# Patient Record
Sex: Male | Born: 1935 | Race: White | Hispanic: No | Smoking: Former smoker
Health system: Southern US, Community
[De-identification: ages and names within clinical notes are randomized; demographics above are authoritative.]

## PROBLEM LIST (undated history)

## (undated) DIAGNOSIS — E785 Hyperlipidemia, unspecified: Secondary | ICD-10-CM

## (undated) DIAGNOSIS — K769 Liver disease, unspecified: Secondary | ICD-10-CM

## (undated) DIAGNOSIS — M1990 Unspecified osteoarthritis, unspecified site: Secondary | ICD-10-CM

## (undated) DIAGNOSIS — Z950 Presence of cardiac pacemaker: Secondary | ICD-10-CM

## (undated) DIAGNOSIS — I350 Nonrheumatic aortic (valve) stenosis: Secondary | ICD-10-CM

## (undated) DIAGNOSIS — H269 Unspecified cataract: Secondary | ICD-10-CM

## (undated) DIAGNOSIS — K219 Gastro-esophageal reflux disease without esophagitis: Secondary | ICD-10-CM

## (undated) DIAGNOSIS — R011 Cardiac murmur, unspecified: Secondary | ICD-10-CM

## (undated) DIAGNOSIS — I10 Essential (primary) hypertension: Secondary | ICD-10-CM

## (undated) DIAGNOSIS — K222 Esophageal obstruction: Secondary | ICD-10-CM

## (undated) HISTORY — DX: Nonrheumatic aortic (valve) stenosis: I35.0

## (undated) HISTORY — DX: Cardiac murmur, unspecified: R01.1

## (undated) HISTORY — DX: Gastro-esophageal reflux disease without esophagitis: K21.9

## (undated) HISTORY — DX: Liver disease, unspecified: K76.9

## (undated) HISTORY — DX: Unspecified cataract: H26.9

## (undated) HISTORY — DX: Esophageal obstruction: K22.2

## (undated) HISTORY — DX: Unspecified osteoarthritis, unspecified site: M19.90

---
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
## 1999-10-20 HISTORY — PX: HEMORROIDECTOMY: SUR656

## 2003-10-20 HISTORY — PX: HERNIA REPAIR: SHX51

## 2011-10-20 HISTORY — PX: OTHER SURGICAL HISTORY: SHX169

## 2016-07-11 ENCOUNTER — Encounter (HOSPITAL_BASED_OUTPATIENT_CLINIC_OR_DEPARTMENT_OTHER): Payer: Self-pay | Admitting: Emergency Medicine

## 2016-07-11 ENCOUNTER — Emergency Department (HOSPITAL_BASED_OUTPATIENT_CLINIC_OR_DEPARTMENT_OTHER)
Admission: EM | Admit: 2016-07-11 | Discharge: 2016-07-11 | Disposition: A | Discharge status: Home / Self Care | Payer: Medicare PPO | Attending: Emergency Medicine | Admitting: Emergency Medicine

## 2016-07-11 ENCOUNTER — Emergency Department (HOSPITAL_BASED_OUTPATIENT_CLINIC_OR_DEPARTMENT_OTHER): Payer: Medicare PPO

## 2016-07-11 ENCOUNTER — Other Ambulatory Visit: Payer: Self-pay

## 2016-07-11 DIAGNOSIS — R0602 Shortness of breath: Secondary | ICD-10-CM | POA: Insufficient documentation

## 2016-07-11 DIAGNOSIS — Z79899 Other long term (current) drug therapy: Secondary | ICD-10-CM | POA: Insufficient documentation

## 2016-07-11 DIAGNOSIS — I10 Essential (primary) hypertension: Secondary | ICD-10-CM | POA: Diagnosis present

## 2016-07-11 HISTORY — DX: Essential (primary) hypertension: I10

## 2016-07-11 HISTORY — DX: Hyperlipidemia, unspecified: E78.5

## 2016-07-11 LAB — CBC WITH DIFFERENTIAL/PLATELET
BASOS ABS: 0 10*3/uL (ref 0.0–0.1)
Basophils Relative: 0 %
EOS ABS: 0.2 10*3/uL (ref 0.0–0.7)
EOS PCT: 3 %
HCT: 43.1 % (ref 39.0–52.0)
Hemoglobin: 15.2 g/dL (ref 13.0–17.0)
Lymphocytes Relative: 20 %
Lymphs Abs: 1.2 10*3/uL (ref 0.7–4.0)
MCH: 33.9 pg (ref 26.0–34.0)
MCHC: 35.3 g/dL (ref 30.0–36.0)
MCV: 96.2 fL (ref 78.0–100.0)
MONO ABS: 0.6 10*3/uL (ref 0.1–1.0)
Monocytes Relative: 9 %
Neutro Abs: 3.9 10*3/uL (ref 1.7–7.7)
Neutrophils Relative %: 68 %
PLATELETS: 123 10*3/uL — AB (ref 150–400)
RBC: 4.48 MIL/uL (ref 4.22–5.81)
RDW: 11.8 % (ref 11.5–15.5)
WBC: 5.8 10*3/uL (ref 4.0–10.5)

## 2016-07-11 LAB — COMPREHENSIVE METABOLIC PANEL
ALK PHOS: 58 U/L (ref 38–126)
ALT: 18 U/L (ref 17–63)
ANION GAP: 9 (ref 5–15)
AST: 31 U/L (ref 15–41)
Albumin: 4.6 g/dL (ref 3.5–5.0)
BILIRUBIN TOTAL: 1.5 mg/dL — AB (ref 0.3–1.2)
BUN: 14 mg/dL (ref 6–20)
CALCIUM: 9.2 mg/dL (ref 8.9–10.3)
CO2: 28 mmol/L (ref 22–32)
CREATININE: 0.77 mg/dL (ref 0.61–1.24)
Chloride: 103 mmol/L (ref 101–111)
Glucose, Bld: 97 mg/dL (ref 65–99)
Potassium: 3.9 mmol/L (ref 3.5–5.1)
SODIUM: 140 mmol/L (ref 135–145)
TOTAL PROTEIN: 7.5 g/dL (ref 6.5–8.1)

## 2016-07-11 LAB — URINALYSIS, ROUTINE W REFLEX MICROSCOPIC
Bilirubin Urine: NEGATIVE
GLUCOSE, UA: NEGATIVE mg/dL
Hgb urine dipstick: NEGATIVE
Ketones, ur: 15 mg/dL — AB
NITRITE: NEGATIVE
PROTEIN: NEGATIVE mg/dL
SPECIFIC GRAVITY, URINE: 1.012 (ref 1.005–1.030)
pH: 7 (ref 5.0–8.0)

## 2016-07-11 LAB — URINE MICROSCOPIC-ADD ON: RBC / HPF: NONE SEEN RBC/hpf (ref 0–5)

## 2016-07-11 LAB — TROPONIN I

## 2016-07-11 LAB — PROTIME-INR
INR: 0.97
PROTHROMBIN TIME: 12.9 s (ref 11.4–15.2)

## 2016-07-11 LAB — BRAIN NATRIURETIC PEPTIDE: B Natriuretic Peptide: 30.3 pg/mL (ref 0.0–100.0)

## 2016-07-11 MED ORDER — ASPIRIN 81 MG PO CHEW
324.0000 mg | CHEWABLE_TABLET | Freq: Once | ORAL | Status: DC
  Filled 2016-07-11: qty 4

## 2016-07-11 MED ORDER — METOPROLOL TARTRATE 5 MG/5ML IV SOLN
2.5000 mg | Freq: Once | INTRAVENOUS | Status: DC
  Filled 2016-07-11: qty 5

## 2016-07-11 MED ORDER — ASPIRIN 81 MG PO CHEW
243.0000 mg | CHEWABLE_TABLET | Freq: Once | ORAL | Status: AC
  Administered 2016-07-11: 243 mg via ORAL

## 2016-07-11 NOTE — ED Notes (Signed)
Pt took 1 81mg  aspirin tablet at home this morning prior to coming to ED.  Dr Johnney Killian aware.  324mg  aspiring order changed to 243mg  aspirin.

## 2016-07-11 NOTE — ED Notes (Signed)
Pt reports dizziness this morning and states when he checjed his BP at home, it was systolic BP over A999333, diastolic over 123XX123.  Pt denies chest pain, SOB, nausea or other symptoms at this time.  Pt states he took 2 pills of his home BP medications (Lisinopril) this morning (NOT as prescribed) due to his concerns about his elevated BP.

## 2016-07-11 NOTE — ED Triage Notes (Addendum)
Pt reports taking BP at home and has had a high reading over the last several days. Pt takes lisinopril. Denies HA. Pt reports hearing a buzzing in his ears. Denies chest pain or SOB. Pt also reports feeling light headed

## 2016-07-11 NOTE — ED Notes (Signed)
Patient transported to X-ray 

## 2016-07-11 NOTE — Discharge Instructions (Signed)
Monitoring your blood pressure for the next several days. Keep a log. Write down the times of the day when you take your blood pressure medications. Keep a log of your diet. Discontinue meloxicam. If you need pain medication for arthritic pain, use extra strength Tylenol.

## 2016-07-11 NOTE — ED Provider Notes (Signed)
[facility] Provider Note   CSN: [id number] Arrival date & time: 07/11/16  1053     History   Chief Complaint Chief Complaint  Patient presents with  . Hypertension    HPI Joshua [name] is a 80 y.o. male.  HPI Patient's blood pressure spiked today. He reports that it was going up from 123456 systolic up to A999333 systolic on his home monitor. He reports he was monitoring his blood pressure because he just didn't feel that well this morning. He had a vague feeling of malaise. There was no headache, no blurred vision, no chest pain, no shortness of breath. Patient has noticed a slight amount of swelling in his legs more than normal for him. He has been taking meloxicam for about the past 2 months for knee pain that is considered osteoarthritic in nature. This was diagnosed by his orthopod. He has otherwise been on the same dose of lisinopril for several years. He takes 5 mg in the morning and 5 mg in the evening. He reports is compliant with his blood pressure medications.  Patient reports has a fairly prominent heart murmur and is been present since childhood. He reports has been stable for his whole life. Past Medical History:  Diagnosis Date  . Hyperlipidemia   . Hypertension     There are no active problems to display for this patient.   Past Surgical History:  Procedure Laterality Date  . HERNIA REPAIR    . TONSILLECTOMY         Home Medications    Prior to Admission medications   Medication Sig Start Date End Date Taking? Authorizing Provider  atorvastatin (LIPITOR) 10 MG tablet Take 10 mg by mouth daily.   Yes Historical Provider, MD  lisinopril (PRINIVIL,ZESTRIL) 5 MG tablet Take 5 mg by mouth daily.   Yes Historical Provider, MD    Family History No family history on file.  Social History Social History  Substance Use Topics  . Smoking status: Never Smoker  . Smokeless tobacco: Never Used  . Alcohol use Yes     Allergies   Review of  patient's allergies indicates no known allergies.   Review of Systems Review of Systems 10 Systems reviewed and are negative for acute change except as noted in the HPI.   Physical Exam Updated Vital Signs BP 143/68   Pulse 91   Temp 98.7 F (37.1 C) (Oral)   Resp 17   Ht 6' (1.829 m)   Wt 187 lb (84.8 kg)   SpO2 96%   BMI 25.36 kg/m   Physical Exam  Constitutional: He is oriented to person, place, and time. He appears well-developed and well-nourished.  HENT:  Head: Normocephalic and atraumatic.  Mouth/Throat: Oropharynx is clear and moist.  Eyes: Conjunctivae and EOM are normal.  Neck: Neck supple.  Cardiovascular: Normal rate and regular rhythm.   Murmur heard. Patient has 3\6 systolic ejection murmur. No gallop. Occasional ectopic beat.  Pulmonary/Chest: Effort normal and breath sounds normal. No respiratory distress. He has no wheezes. He has no rales.  Abdominal: Soft. There is no tenderness.  Musculoskeletal: He exhibits edema.  Patient has approximately 1+ pitting edema bilateral ankles. No calf tenderness.  Neurological: He is alert and oriented to person, place, and time. No cranial nerve deficit. He exhibits normal muscle tone. Coordination normal.  Skin: Skin is warm and dry.  Psychiatric: He has a normal mood and affect.  Nursing note and vitals reviewed.    ED Treatments /  Results  Labs (all labs ordered are listed, but only abnormal results are displayed) Labs Reviewed  COMPREHENSIVE METABOLIC PANEL - Abnormal; Notable for the following:       Result Value   Total Bilirubin 1.5 (*)    All other components within normal limits  CBC WITH DIFFERENTIAL/PLATELET - Abnormal; Notable for the following:    Platelets 123 (*)    All other components within normal limits  URINALYSIS, ROUTINE W REFLEX MICROSCOPIC (NOT AT [facility]) - Abnormal; Notable for the following:    Ketones, ur 15 (*)    Leukocytes, UA SMALL (*)    All other components within normal limits   URINE MICROSCOPIC-ADD ON - Abnormal; Notable for the following:    Squamous Epithelial / LPF 0-5 (*)    Bacteria, UA RARE (*)    All other components within normal limits  BRAIN NATRIURETIC PEPTIDE  TROPONIN I  PROTIME-INR    EKG  EKG Interpretation  Date/Time:  Saturday July 11 2016 11:21:42 EDT Ventricular Rate:  97 PR Interval:    QRS Duration: 91 QT Interval:  356 QTC Calculation: 453 R Axis:   105 Text Interpretation:  Sinus tachycardia Ventricular trigeminy Right axis deviation Probable anteroseptal infarct, old frequent PVC. No acute ischemic appearance. no old comparison. Confirmed by Johnney Killian, MD, Jeannie Done [phone]) on 07/11/2016 3:57:11 PM       Radiology Dg Chest 2 View  Result Date: 07/11/2016 CLINICAL DATA:  Hypertension EXAM: CHEST  2 VIEW COMPARISON:  None. FINDINGS: Lungs are clear.  No pleural effusion or pneumothorax. The heart is normal in size. Degenerative changes of the visualized thoracolumbar spine. IMPRESSION: Normal chest radiographs. Electronically Signed   By: Julian Hy M.D.   On: 07/11/2016 12:23    Procedures Procedures (including critical care time)  Medications Ordered in ED Medications  aspirin chewable tablet 243 mg (243 mg Oral Given 07/11/16 1156)     Initial Impression / Assessment and Plan / ED Course  I have reviewed the triage vital signs and the nursing notes.  Pertinent labs & imaging results that were available during my care of the patient were reviewed by me and considered in my medical decision making (see chart for details).  Clinical Course    Final Clinical Impressions(s) / ED Diagnoses   Final diagnoses:  Essential hypertension   Patient had vague symptoms of malaise this morning. He measured his blood pressure and found it to be elevated and climbing. He did not have associated hypertensive urgency symptoms. Patient's blood pressure has spontaneously improved without administration of antihypertensives.  Diagnostic workup does not show acute ischemic event, vascular congestion or electrolyte anomaly. Patient is anxious to leave the hospital and at this time plan will be for him to continue his medications as prescribed in monitor with documentation his blood pressures as well as diet and medications that he takes. He is counseled that he must return immediately should he develop chest pain shortness of breath headache blurred vision or other concerning symptoms.  New Prescriptions Discharge Medication List as of 07/11/2016  1:54 PM       Charlesetta Shanks, MD 07/11/16 1601

## 2016-07-11 NOTE — ED Notes (Signed)
MD notified of pt's BP -- will hold Lopressor for now per MD verbal order.

## 2016-07-11 NOTE — ED Notes (Signed)
Dr Johnney Killian in room with patient now.

## 2016-07-15 ENCOUNTER — Encounter: Payer: Self-pay | Admitting: Cardiovascular Disease

## 2016-07-15 ENCOUNTER — Ambulatory Visit (INDEPENDENT_AMBULATORY_CARE_PROVIDER_SITE_OTHER): Payer: Medicare PPO | Admitting: Cardiovascular Disease

## 2016-07-15 VITALS — BP 148/96 | HR 85 | Ht 72.0 in | Wt 189.4 lb

## 2016-07-15 DIAGNOSIS — R011 Cardiac murmur, unspecified: Secondary | ICD-10-CM | POA: Diagnosis not present

## 2016-07-15 DIAGNOSIS — I150 Renovascular hypertension: Secondary | ICD-10-CM | POA: Diagnosis not present

## 2016-07-15 MED ORDER — LISINOPRIL 10 MG PO TABS: 10.0000 mg | ORAL_TABLET | Freq: Two times a day (BID) | ORAL | 3 refills | Status: DC

## 2016-07-15 NOTE — Patient Instructions (Signed)
Medication Instructions: Dr Sallyanne Kuster recommends that you continue on your current medications as directed. Please refer to the Current Medication list given to you today.  Labwork: NONE ORDERED  Testing/Procedures: 1. Echocardiogram - Your physician has requested that you have an echocardiogram. Echocardiography is a painless test that uses sound waves to create images of your heart. It provides your doctor with information about the size and shape of your heart and how well your heart's chambers and valves are working. This procedure takes approximately one hour. There are no restrictions for this procedure. This will be performed at our [place], [address].  2. Renal Artery Doppler - Your physician has requested that you have a renal artery duplex. During this test, an ultrasound is used to evaluate blood flow to the kidneys. Allow one hour for this exam. Do not eat after midnight the day before and avoid carbonated beverages. Take your medications as you usually do.  Follow-up: Dr Sallyanne Kuster recommends that you schedule a follow-up appointment in 1 month.  If you need a refill on your cardiac medications before your next appointment, please call your pharmacy.

## 2016-07-15 NOTE — Progress Notes (Signed)
Cardiology Consultation Note    Date:  07/16/2016   ID:  Joshua [name], DOB 1936/05/08, MRN [id number]  Requesting PCP:  Andria Frames, MD  Cardiologist:   Sanda Klein, MD   Reason for consultation: severely elevated blood pressure   History of Present Illness:  Joshua [name] is a 80 y.o. male with relatively late onset systemic hypertension around the age of 58, recently had recordings of markedly elevated blood pressure, as high as 220/100 mmHg. These were not symptomatic, but concerned him and led to emergency room evaluation on September 23. Workup was benign and his blood pressure spontaneously normalized. He reports that his blood pressure recordings are very erratic, changing from minute to minute. In the emergency room his electrocardiogram showed persistent ventricular trigeminy and this is also present on physical exam today. He reports having had a heart murmur since he was about 63 or 80 years old and believes that he had a remote echocardiogram that was unremarkable. Labs performed in the emergency room do not show significant abnormalities including normal potassium and creatinine levels unremarkable urinalysis and a normal chest x-ray. Troponin and BNP were both very low and hemoglobin is normal.   In the past, when taking once daily lisinopril his blood pressure would often be high towards the end of the day, which is why he started taking the medication twice daily. His home recordings now seem to show a pattern of elevation in blood pressure in the early mornings. Lower blood pressures later in the day.  Prior to his emergency room evaluation he was taking meloxicam daily for degenerative joint disease. He has subsequently stopped this medication.  He is fairly physically active and feels well. He specifically denies exertional angina, exertional dyspnea or exertional syncope/dizziness. He is unaware of palpitations even when his rhythm is clearly irregular. He  denies any history of stroke or other focal neurological events and has generally been very healthy his entire life.  He is originally from [place] but has lived in [place] and other parts of the [place]. His son lives in the area which is why he relocated to [place]. He is a retired Optometrist.  Past Medical History:  Diagnosis Date  . Hyperlipidemia   . Hypertension   . Liver disease     Past Surgical History:  Procedure Laterality Date  . HEMORROIDECTOMY  2001  . HERNIA REPAIR  2005  . TONSILLECTOMY  1948    Current Medications: Outpatient Medications Prior to Visit  Medication Sig Dispense Refill  . atorvastatin (LIPITOR) 10 MG tablet Take 10 mg by mouth daily.    Marland Kitchen lisinopril (PRINIVIL,ZESTRIL) 5 MG tablet Take 5 mg by mouth daily.     No facility-administered medications prior to visit.      Allergies:   Review of patient's allergies indicates no known allergies.   Social History   Social History  . Marital status: Married    Spouse name: N/A  . Number of children: N/A  . Years of education: N/A   Social History Main Topics  . Smoking status: Never Smoker  . Smokeless tobacco: Never Used  . Alcohol use Yes  . Drug use: Unknown  . Sexual activity: Not Asked   Other Topics Concern  . None   Social History Narrative   Epworth Sleepiness Scale = 3 (as of 07/15/2016)     Family History:  His father had rheumatic fever, valve replacement surgery and subsequent congestive heart failure. His mother  also died of congestive heart failure when she was 78 years old. He has lost a couple of siblings at a young age in a car accident.  ROS:   Please see the history of present illness.    ROS All other systems reviewed and are negative.   PHYSICAL EXAM:   VS:  BP (!) 148/96   Pulse 85   Ht 6' (1.829 m)   Wt 85.9 kg (189 lb 6.4 oz)   BMI 25.69 kg/m   Equal pressures in the upper and lower extremities and between the right and left upper  extremity. No radial femoral delay. No bruits heard over the subclavian or femoral arteries. No abdominal bruits  GEN: Well nourished, well developed, in no acute distress  HEENT: normal  Neck: no JVD, carotid bruits, or masses Cardiac: Normal first and second heart sound without audible click, RRR alternating with periods of sustained trigeminal rhythm; early to mid peaking 3/6 systolic ejection murmur in the aortic focus, no diastolic murmurs, rubs, or gallops, no edema  Respiratory:  clear to auscultation bilaterally, normal work of breathing GI: soft, nontender, nondistended, + BS MS: no deformity or atrophy  Skin: warm and dry, no rash Neuro:  Alert and Oriented x 3, Strength and sensation are intact Psych: euthymic mood, full affect  Wt Readings from Last 3 Encounters:  07/15/16 85.9 kg (189 lb 6.4 oz)  07/11/16 84.8 kg (187 lb)      Studies/Labs Reviewed:   EKG:  EKG is ordered today.  The ekg ordered today demonstrates Normal sinus rhythm with a single PVC  Recent Labs: 07/11/2016: ALT 18; B Natriuretic Peptide 30.3; BUN 14; Creatinine, Ser 0.77; Hemoglobin 15.2; Platelets 123; Potassium 3.9; Sodium 140    Additional studies/ records that were reviewed today include:  Records from emergency room evaluation and Dr. Kristie Cowman    ASSESSMENT:    1. Cardiac murmur   2. Renovascular hypertension      PLAN:  In order of problems listed above:  1. HTN: This has been rather erratic and has remarkably late age of onset, raising the possibility of renal vascular hypertension. He has normal potassium and renal function parameters and is already taking an ACE inhibitor. Recommend a renal duplex arterial ultrasound. Part of the marked variability in his blood pressure is, I am sure, related to his PVCs. The irregularity of the rhythm while he is in trigeminy can lead to marked errors in blood pressure estimation by an automatic blood pressure cuff. Have asked him to check his  blood pressure 3 or 4 times in a row and use the median value as the correct BP. For the time being no changes are made to his prescription. 2. Murmur: His murmur sounds like aortic stenosis that is probably at least moderately severe by exam. If he does have aortic stenosis, it appears to be asymptomatic. Since he has had a murmur since childhood, he may have a bicuspid aortic valve. His physical exam does not support a diagnosis of coarctation. Recommend echocardiogram. 3. DJD: Avoid NSAIDs, prefer acetaminophen     Medication Adjustments/Labs and Tests Ordered: Current medicines are reviewed at length with the patient today.  Concerns regarding medicines are outlined above.  Medication changes, Labs and Tests ordered today are listed in the Patient Instructions below. Patient Instructions  Medication Instructions: Dr Sallyanne Kuster recommends that you continue on your current medications as directed. Please refer to the Current Medication list given to you today.  Labwork: NONE ORDERED  Testing/Procedures: 1. Echocardiogram - Your physician has requested that you have an echocardiogram. Echocardiography is a painless test that uses sound waves to create images of your heart. It provides your doctor with information about the size and shape of your heart and how well your heart's chambers and valves are working. This procedure takes approximately one hour. There are no restrictions for this procedure. This will be performed at our [place], [address].  2. Renal Artery Doppler - Your physician has requested that you have a renal artery duplex. During this test, an ultrasound is used to evaluate blood flow to the kidneys. Allow one hour for this exam. Do not eat after midnight the day before and avoid carbonated beverages. Take your medications as you usually do.  Follow-up: Dr Sallyanne Kuster recommends that you schedule a follow-up appointment in 1 month.  If you need a  refill on your cardiac medications before your next appointment, please call your pharmacy.    Signed, Sanda Klein, MD  07/16/2016 2:37 PM    [address], [place],   [address] Phone: [phone]; Fax: [phone]

## 2016-07-16 ENCOUNTER — Other Ambulatory Visit: Payer: Self-pay | Admitting: Cardiovascular Disease

## 2016-07-16 DIAGNOSIS — R011 Cardiac murmur, unspecified: Secondary | ICD-10-CM | POA: Insufficient documentation

## 2016-07-16 DIAGNOSIS — I10 Essential (primary) hypertension: Secondary | ICD-10-CM | POA: Insufficient documentation

## 2016-07-22 ENCOUNTER — Ambulatory Visit (HOSPITAL_COMMUNITY)
Admission: RE | Admit: 2016-07-22 | Discharge: 2016-07-22 | Disposition: A | Discharge status: Home / Self Care | Payer: Medicare PPO | Source: Ambulatory Visit | Attending: Cardiovascular Disease | Admitting: Cardiovascular Disease

## 2016-07-22 DIAGNOSIS — E785 Hyperlipidemia, unspecified: Secondary | ICD-10-CM | POA: Insufficient documentation

## 2016-07-22 DIAGNOSIS — I10 Essential (primary) hypertension: Secondary | ICD-10-CM

## 2016-07-30 ENCOUNTER — Other Ambulatory Visit (HOSPITAL_COMMUNITY): Payer: Medicare PPO

## 2016-07-30 ENCOUNTER — Ambulatory Visit (HOSPITAL_COMMUNITY): Payer: Medicare PPO | Attending: Cardiology

## 2016-07-30 ENCOUNTER — Other Ambulatory Visit: Payer: Self-pay

## 2016-07-30 DIAGNOSIS — I10 Essential (primary) hypertension: Secondary | ICD-10-CM | POA: Diagnosis not present

## 2016-07-30 DIAGNOSIS — I350 Nonrheumatic aortic (valve) stenosis: Secondary | ICD-10-CM | POA: Insufficient documentation

## 2016-07-30 DIAGNOSIS — K769 Liver disease, unspecified: Secondary | ICD-10-CM | POA: Diagnosis not present

## 2016-07-30 DIAGNOSIS — E785 Hyperlipidemia, unspecified: Secondary | ICD-10-CM | POA: Insufficient documentation

## 2016-07-30 DIAGNOSIS — R011 Cardiac murmur, unspecified: Secondary | ICD-10-CM | POA: Diagnosis not present

## 2016-08-13 ENCOUNTER — Encounter: Payer: Self-pay | Admitting: Cardiovascular Disease

## 2016-08-13 ENCOUNTER — Ambulatory Visit (INDEPENDENT_AMBULATORY_CARE_PROVIDER_SITE_OTHER): Payer: Medicare PPO | Admitting: Cardiovascular Disease

## 2016-08-13 VITALS — BP 157/90 | HR 97 | Ht 72.0 in | Wt 187.8 lb

## 2016-08-13 DIAGNOSIS — I493 Ventricular premature depolarization: Secondary | ICD-10-CM

## 2016-08-13 DIAGNOSIS — I350 Nonrheumatic aortic (valve) stenosis: Secondary | ICD-10-CM

## 2016-08-13 DIAGNOSIS — E7800 Pure hypercholesterolemia, unspecified: Secondary | ICD-10-CM | POA: Diagnosis not present

## 2016-08-13 DIAGNOSIS — I10 Essential (primary) hypertension: Secondary | ICD-10-CM | POA: Diagnosis not present

## 2016-08-13 NOTE — Progress Notes (Signed)
Cardiology Office Note    Date:  08/14/2016   ID:  [name], DOB Mar 07, 1936, MRN [id number]  Requesting PCP:  Andria Frames, MD  Cardiologist:   Sanda Klein, MD   Chief complaint: Follow-up echocardiogram and renal artery duplex ultrasound  History of Present Illness:  Joshua [name] is a 80 y.o. male with relatively late onset systemic hypertension around the age of 89, recently had recordings of Asymptomatic but markedly elevated blood pressure, as high as 220/100 mmHg. He reports having had a heart murmur since he was about 50 or 80 years old. He has frequent asymptomatic PVCs.  He returns in follow-up after undergoing an echocardiogram and renal artery ultrasound. After he stopped taking meloxicam, his blood pressure has quickly normalized. He brings a very detailed account of his blood pressure checked on a daily basis, several times a day. His blood pressure is fairly consistently in the 110-120/70s. Occasional outer range readings are seen. He does have very frequent PVCs that may lead to erroneous readings.  Renal artery duplex ultrasound did not show any evidence of renal artery stenosis. His echocardiogram confirmed the clinical impression that he has moderate aortic valve stenosis.  He is physically active and feels well. He specifically denies exertional angina, exertional dyspnea or exertional syncope/dizziness. He is unaware of palpitations even when his rhythm is clearly irregular. He denies any history of stroke or other focal neurological events and has generally been very healthy his entire life.  He is originally from [place] but has lived in [place] and other parts of the [place]. His son lives in the area which is why he relocated to [place]. He is a retired Optometrist.  Past Medical History:  Diagnosis Date  . Hyperlipidemia   . Hypertension   . Liver disease     Past Surgical History:  Procedure Laterality Date  .  HEMORROIDECTOMY  2001  . HERNIA REPAIR  2005  . TONSILLECTOMY  1948    Current Medications: Outpatient Medications Prior to Visit  Medication Sig Dispense Refill  . aspirin 81 MG chewable tablet Chew 81 mg by mouth daily.    Marland Kitchen lisinopril (PRINIVIL,ZESTRIL) 10 MG tablet Take 1 tablet (10 mg total) by mouth 2 (two) times daily. 90 tablet 3  . omeprazole (PRILOSEC) 40 MG capsule Take 1 capsule by mouth daily.    Marland Kitchen atorvastatin (LIPITOR) 10 MG tablet Take 10 mg by mouth daily.     No facility-administered medications prior to visit.      Allergies:   Review of patient's allergies indicates no known allergies.   Social History   Social History  . Marital status: Married    Spouse name: N/A  . Number of children: N/A  . Years of education: N/A   Social History Main Topics  . Smoking status: Never Smoker  . Smokeless tobacco: Never Used  . Alcohol use Yes  . Drug use: Unknown  . Sexual activity: Not Asked   Other Topics Concern  . None   Social History Narrative   Epworth Sleepiness Scale = 3 (as of 07/15/2016)     Family History:  His father had rheumatic fever, valve replacement surgery and subsequent congestive heart failure. His mother also died of congestive heart failure when she was 78 years old. He has lost a couple of siblings at a young age in a car accident.  ROS:   Please see the history of present illness.    ROS All other  systems reviewed and are negative.   PHYSICAL EXAM:   VS:  BP (!) 157/90   Pulse 97   Ht 6' (1.829 m)   Wt 187 lb 12.8 oz (85.2 kg)   SpO2 93%   BMI 25.47 kg/m   Equal pressures in the upper and lower extremities and between the right and left upper extremity. No radial femoral delay. No bruits heard over the subclavian or femoral arteries. No abdominal bruits  GEN: Well nourished, well developed, in no acute distress  HEENT: normal  Neck: no JVD, carotid bruits, or masses Cardiac: Normal first and second heart sound without audible  click, RRR alternating with periods of sustained trigeminal rhythm; early to mid peaking 3/6 systolic ejection murmur in the aortic focus, no diastolic murmurs, rubs, or gallops, no edema  Respiratory:  clear to auscultation bilaterally, normal work of breathing GI: soft, nontender, nondistended, + BS MS: no deformity or atrophy  Skin: warm and dry, no rash Neuro:  Alert and Oriented x 3, Strength and sensation are intact Psych: euthymic mood, full affect  Wt Readings from Last 3 Encounters:  08/13/16 187 lb 12.8 oz (85.2 kg)  07/15/16 189 lb 6.4 oz (85.9 kg)  07/11/16 187 lb (84.8 kg)      Studies/Labs Reviewed:   EKG:  EKG is ordered today.  The ekg ordered today demonstrates Normal sinus rhythm with a single PVC  Recent Labs: 07/11/2016: ALT 18; B Natriuretic Peptide 30.3; BUN 14; Creatinine, Ser 0.77; Hemoglobin 15.2; Platelets 123; Potassium 3.9; Sodium 140     ASSESSMENT:    1. Essential hypertension   2. Moderate calcific aortic stenosis   3. PVCs (premature ventricular contractions)   4. Hypercholesterolemia      PLAN:  In order of problems listed above:  1. HTN: This has Improved remarkably after stopping his NSAID medication. Avoid routine use of NSAIDs in the future. Continue current medical regimen.  2. Aortic stenosis: Moderate severity, asymptomatic. Reviewed the natural history of aortic stenosis, symptoms that suggest it has become hemodynamically significant and surgical management with either SAVR (current gold standard) or TAVR. He should remain physically active on a regular basis and promptly report exertional dyspnea/angina/syncope. 3. PVCs: Asymptomatic, did not require specific therapy 4. HLP: On statin 5. DJD: Avoid NSAIDs, prefer acetaminophen     Medication Adjustments/Labs and Tests Ordered: Current medicines are reviewed at length with the patient today.  Concerns regarding medicines are outlined above.  Medication changes, Labs and Tests  ordered today are listed in the Patient Instructions below. Patient Instructions  Dr Sallyanne Kuster recommends that you schedule a follow-up appointment in 1 year. You will receive a reminder letter in the mail two months in advance. If you don't receive a letter, please call our office to schedule the follow-up appointment.  If you need a refill on your cardiac medications before your next appointment, please call your pharmacy.    Signed, Sanda Klein, MD  08/14/2016 11:12 AM    [facility] [address], [place],   [address] Phone: [phone]; Fax: [phone]

## 2016-08-13 NOTE — Patient Instructions (Signed)
Dr Croitoru recommends that you schedule a follow-up appointment in 1 year. You will receive a reminder letter in the mail two months in advance. If you don't receive a letter, please call our office to schedule the follow-up appointment.  If you need a refill on your cardiac medications before your next appointment, please call your pharmacy. 

## 2016-08-14 DIAGNOSIS — E7800 Pure hypercholesterolemia, unspecified: Secondary | ICD-10-CM | POA: Insufficient documentation

## 2016-08-14 DIAGNOSIS — M1990 Unspecified osteoarthritis, unspecified site: Secondary | ICD-10-CM | POA: Insufficient documentation

## 2016-08-14 DIAGNOSIS — I493 Ventricular premature depolarization: Secondary | ICD-10-CM | POA: Insufficient documentation

## 2016-08-14 DIAGNOSIS — I350 Nonrheumatic aortic (valve) stenosis: Secondary | ICD-10-CM | POA: Insufficient documentation

## 2016-11-26 ENCOUNTER — Ambulatory Visit (INDEPENDENT_AMBULATORY_CARE_PROVIDER_SITE_OTHER): Payer: Medicare PPO | Admitting: Podiatry

## 2016-11-26 ENCOUNTER — Encounter: Payer: Self-pay | Admitting: Podiatry

## 2016-11-26 VITALS — BP 136/81 | HR 100 | Resp 16 | Ht 72.0 in | Wt 187.0 lb

## 2016-11-26 DIAGNOSIS — M79676 Pain in unspecified toe(s): Secondary | ICD-10-CM

## 2016-11-26 DIAGNOSIS — B351 Tinea unguium: Secondary | ICD-10-CM

## 2016-11-26 DIAGNOSIS — L84 Corns and callosities: Secondary | ICD-10-CM

## 2016-11-26 NOTE — Progress Notes (Signed)
   Subjective:    Patient ID: Joshua [name], male    DOB: 19-Nov-1935, 81 y.o.   MRN: [id number]  HPI 's patient presents the office with chief complaint of long thick nails, as well as painful calluses on both feet. He states he is unable to reach his nails anymore and it the nails are painful walking and wearing his shoes. He also says he has developed painful calluses on the forefoot of both feet as well as his heels. He presents the office today for preventative foot care services    Review of Systems  All other systems reviewed and are negative.      Objective:   Physical Exam GENERAL APPEARANCE: Alert, conversant. Appropriately groomed. No acute distress.  VASCULAR: Pedal pulses are  palpable at  DP right foot. PT pulses right foot dininished.  DP and PT left foot are non palpable.  Capillary refill time is immediate to all digits,  Cold feet noted  Digital hair growth is present bilateral  NEUROLOGIC: sensation is normal to 5.07 monofilament at 5/5 sites bilateral.  Light touch is intact bilateral, Muscle strength normal.  MUSCULOSKELETAL: acceptable muscle strength, tone and stability bilateral.  Intrinsic muscluature intact bilateral.  Rectus appearance of foot and digits noted bilateral.   DERMATOLOGIC: skin color, texture, and turgor are within normal limits.  No preulcerative lesions or ulcers  are seen, no interdigital maceration noted.  No open lesions present.   No drainage noted.Csallus sib 1,5 left foot.  Callus sib 1,4 right foot.  Heel callus noted  B/L  Nails  Thick disfigured discolored nails both feet.         Assessment & Plan:  Onychomycosis  Callus  B/L  IE  Debride nails  Debride callus.  RTC 3 months.   Gardiner Barefoot DPM

## 2017-02-25 ENCOUNTER — Ambulatory Visit (INDEPENDENT_AMBULATORY_CARE_PROVIDER_SITE_OTHER): Payer: Medicare PPO | Admitting: Podiatry

## 2017-02-25 DIAGNOSIS — B351 Tinea unguium: Secondary | ICD-10-CM | POA: Diagnosis not present

## 2017-02-25 DIAGNOSIS — M79676 Pain in unspecified toe(s): Secondary | ICD-10-CM

## 2017-02-25 DIAGNOSIS — L84 Corns and callosities: Secondary | ICD-10-CM

## 2017-02-25 NOTE — Progress Notes (Signed)
   Subjective:    Patient ID: Joshua [name], male    DOB: September 03, 1936, 81 y.o.   MRN: [id number]  HPI 's patient presents the office with chief complaint of long thick nails, as well as painful calluses on both feet. He states he is unable to reach his nails anymore and it the nails are painful walking and wearing his shoes. He also says he has developed painful calluses on the forefoot of both feet as well as his heels. He presents the office today for preventative foot care services    Review of Systems  All other systems reviewed and are negative.      Objective:   Physical Exam GENERAL APPEARANCE: Alert, conversant. Appropriately groomed. No acute distress.  VASCULAR: Pedal pulses are  palpable at  DP right foot. PT pulses right foot dininished.  DP and PT left foot are non palpable.  Capillary refill time is immediate to all digits,  Cold feet noted   NEUROLOGIC: sensation is normal to 5.07 monofilament at 5/5 sites bilateral.  Light touch is intact bilateral, Muscle strength normal.  MUSCULOSKELETAL: acceptable muscle strength, tone and stability bilateral.  Intrinsic muscluature intact bilateral.  Rectus appearance of foot and digits noted bilateral.   DERMATOLOGIC: skin color, texture, and turgor are within normal limits.  No preulcerative lesions or ulcers  are seen, no interdigital maceration noted.  No open lesions present.   No drainage noted.Csallus sib 1,5 left foot.  Callus sib 1,4 right foot.  Heel callus noted  B/L  Nails  Thick disfigured discolored nails both feet.         Assessment & Plan:  Onychomycosis  Callus  Sub 1 left foot. All other calluses are asymptomatic.  IE  Debride nails  Debride callus.  RTC 3 months.   Gardiner Barefoot DPM

## 2017-06-09 ENCOUNTER — Ambulatory Visit: Payer: Medicare PPO | Admitting: Podiatry

## 2017-07-16 ENCOUNTER — Telehealth: Payer: Self-pay

## 2017-07-16 NOTE — Telephone Encounter (Signed)
Pre-Visit Call completed. 

## 2017-07-19 ENCOUNTER — Encounter: Payer: Self-pay | Admitting: Family Medicine

## 2017-07-19 ENCOUNTER — Ambulatory Visit: Payer: Medicare PPO | Admitting: Family Medicine

## 2017-07-19 ENCOUNTER — Ambulatory Visit (INDEPENDENT_AMBULATORY_CARE_PROVIDER_SITE_OTHER): Payer: Medicare PPO | Admitting: Family Medicine

## 2017-07-19 VITALS — BP 120/82 | HR 88 | Temp 98.8°F | Ht 73.0 in | Wt 185.5 lb

## 2017-07-19 DIAGNOSIS — Z23 Encounter for immunization: Secondary | ICD-10-CM

## 2017-07-19 DIAGNOSIS — M545 Low back pain, unspecified: Secondary | ICD-10-CM

## 2017-07-19 DIAGNOSIS — I10 Essential (primary) hypertension: Secondary | ICD-10-CM | POA: Diagnosis not present

## 2017-07-19 NOTE — Progress Notes (Signed)
Chief Complaint  Patient presents with  . Establish Care       New Patient Visit SUBJECTIVE: HPI: Joshua [name] is an 81 y.o.male who is being seen for establishing care.  LBP for the past 2 weeks. No inciting event. Staying about the same. Middle. He does not exercise routinely. He denies any numbness, tearing, weakness, or loss of bowel/bladder function. He has not tried anything at home.  He also has a history of erectile dysfunction since 2003. He is wondering whether he should even pursue this at his age. He tried Viagra in the past without relief. Of note, he is on this and appropriate.  Hypertension Patient presents for hypertension follow up. He does monitor home blood pressures. Blood pressures ranging on average from 90-110's/60-70's. He is compliant with medications. He is on lisinopril 10 mg daily (just decreased from BID).  Patient has these side effects of medication: none He is adhering to a healthy diet overall. Exercise: none    No Known Allergies   Past Medical History:  Diagnosis Date  . Arthritis   . Cataract   . GERD (gastroesophageal reflux disease)   . Heart murmur   . Hyperlipidemia   . Hypertension   . Liver disease    Past Surgical History:  Procedure Laterality Date  . HEMORROIDECTOMY  2001  . HERNIA REPAIR  2005  . TONSILLECTOMY  1948   Social History   Social History  . Marital status: Married   Social History Main Topics  . Smoking status: Never Smoker  . Smokeless tobacco: Never Used  . Alcohol use Yes     Comment: daily  . Drug use: Unknown   Social History Narrative   Epworth Sleepiness Scale = 3 (as of 07/15/2016)   Family History  Problem Relation Age of Onset  . Heart failure Mother 33  . Heart failure Father 6  . Cancer Maternal Grandmother   . Diabetes Child   . Heart Problems Child 49       cardiac arrest     Current Outpatient Prescriptions:  .  acetaminophen (TYLENOL) 500 MG tablet, Take 500 mg by mouth  every 6 (six) hours as needed., Disp: , Rfl:  .  aspirin 81 MG chewable tablet, Chew 81 mg by mouth daily., Disp: , Rfl:  .  atorvastatin (LIPITOR) 20 MG tablet, Take 1 tablet by mouth daily., Disp: , Rfl:  .  Glucosamine-Chondroit-Vit C-Mn (GLUCOSAMINE 1500 COMPLEX PO), Take by mouth daily., Disp: , Rfl:  .  Multiple Vitamin (MULTIVITAMIN WITH MINERALS) TABS tablet, Take 1 tablet by mouth daily., Disp: , Rfl:  .  omeprazole (PRILOSEC) 40 MG capsule, Take 40 mg by mouth daily., Disp: , Rfl:  .  lisinopril (PRINIVIL,ZESTRIL) 10 MG tablet, Take 1 tablet (10 mg total) by mouth daily., Disp: 90 tablet, Rfl: 3  ROS Cardiovascular: Denies chest pain  Respiratory: Denies dyspnea   OBJECTIVE: BP 120/82 (BP Location: Left Arm, Patient Position: Sitting, Cuff Size: Normal)   Pulse 88   Temp 98.8 F (37.1 C) (Oral)   Ht 6\' 1"  (1.854 m)   Wt 185 lb 8 oz (84.1 kg)   SpO2 95%   BMI 24.47 kg/m   Constitutional: -  VS reviewed -  Well developed, well nourished, appears stated age -  No apparent distress  Psychiatric: -  Oriented to person, place, and time -  Memory intact -  Affect and mood normal -  Fluent conversation, good eye contact -  Judgment  and insight age appropriate  Eye: -  Conjunctivae clear, no discharge -  Pupils symmetric, round, reactive to light  ENMT: -  MMM    Pharynx moist, no exudate, no erythema  Neck: -  No gross swelling, no palpable masses -  Thyroid midline, not enlarged, mobile, no palpable masses  Cardiovascular: -  RRR -  3/6 SM heard loudest at tricuspid listening post (has had and known about since he was a child) -  No LE edema  Respiratory: -  Normal respiratory effort, no accessory muscle use, no retraction -  Breath sounds equal, no wheezes, no ronchi, no crackles  Neurological:  -  2/4 patellar reflex, 0/4 calcaneal reflex bilaterally, no clonus, no cerebellar signs   Musculoskeletal: -  Mild tenderness to palpation over the lumbar paraspinal  musculature L4-L5. Negative straight leg and [place] bilaterally. 5/5 strength bilaterally in the lower extremities.    ASSESSMENT/PLAN: Essential hypertension  Acute bilateral low back pain without sciatica  Need for influenza vaccination - Plan: Flu vaccine HIGH DOSE PF (Fluzone High dose)  Patient instructed to sign release of records form from his previous PCP. Agree w pt's decision to come down on ACEi, cont to check BP as we may be able to come off of it altogether. Home stretches/exercises, Tylenol, heat, yoga. Let us know if things are not improving. Patient should return in 3 mo for a med check, every 6 mo after that or prn. The patient voiced understanding and agreement to the plan.   [place], DO 07/19/17  4:44 PM

## 2017-07-19 NOTE — Patient Instructions (Addendum)
Heat (pad or rice pillow in microwave) over affected area, 10-15 minutes every 2-3 hours while awake.   OK to take Tylenol 1000 mg (2 extra strength tabs) or 975 mg (3 regular strength tabs) every 6 hours as needed.  EXERCISES  RANGE OF MOTION (ROM) AND STRETCHING EXERCISES - Low Back Sprain Most people with lower back pain will find that their symptoms get worse with excessive bending forward (flexion) or arching at the lower back (extension). The exercises that will help resolve your symptoms will focus on the opposite motion.  Your physician, physical therapist or athletic trainer will help you determine which exercises will be most helpful to resolve your lower back pain. Do not complete any exercises without first consulting with your caregiver. Discontinue any exercises which make your symptoms worse, until you speak to your caregiver. If you have pain, numbness or tingling which travels down into your buttocks, leg or foot, the goal of the therapy is for these symptoms to move closer to your back and eventually resolve. Sometimes, these leg symptoms will get better, but your lower back pain may worsen. This is often an indication of progress in your rehabilitation. Be very alert to any changes in your symptoms and the activities in which you participated in the 24 hours prior to the change. Sharing this information with your caregiver will allow him or her to most efficiently treat your condition. These exercises may help you when beginning to rehabilitate your injury. Your symptoms may resolve with or without further involvement from your physician, physical therapist or athletic trainer. While completing these exercises, remember:   Restoring tissue flexibility helps normal motion to return to the joints. This allows healthier, less painful movement and activity.  An effective stretch should be held for at least 30 seconds.  A stretch should never be painful. You should only feel a gentle  lengthening or release in the stretched tissue. FLEXION RANGE OF MOTION AND STRETCHING EXERCISES:  STRETCH - Flexion, Single Knee to Chest   Lie on a firm bed or floor with both legs extended in front of you.  Keeping one leg in contact with the floor, bring your opposite knee to your chest. Hold your leg in place by either grabbing behind your thigh or at your knee.  Pull until you feel a gentle stretch in your low back. Hold 15-20 seconds.  Slowly release your grasp and repeat the exercise with the opposite side. Repeat 2 times. Complete this exercise 1-2 times per day.   STRETCH - Flexion, Double Knee to Chest  Lie on a firm bed or floor with both legs extended in front of you.  Keeping one leg in contact with the floor, bring your opposite knee to your chest.  Tense your stomach muscles to support your back and then lift your other knee to your chest. Hold your legs in place by either grabbing behind your thighs or at your knees.  Pull both knees toward your chest until you feel a gentle stretch in your low back. Hold 15-20 seconds.  Tense your stomach muscles and slowly return one leg at a time to the floor. Repeat 2 times. Complete this exercise 1-2 times per day.   STRETCH - Low Trunk Rotation  Lie on a firm bed or floor. Keeping your legs in front of you, bend your knees so they are both pointed toward the ceiling and your feet are flat on the floor.  Extend your arms out to the side. This  will stabilize your upper body by keeping your shoulders in contact with the floor.  Gently and slowly drop both knees together to one side until you feel a gentle stretch in your low back. Hold for 15-20 seconds.  Tense your stomach muscles to support your lower back as you bring your knees back to the starting position. Repeat the exercise to the other side. Repeat 2 times. Complete this exercise 1-2 times per day  EXTENSION RANGE OF MOTION AND FLEXIBILITY EXERCISES:  STRETCH -  Extension, Prone on Elbows   Lie on your stomach on the floor, a bed will be too soft. Place your palms about shoulder width apart and at the height of your head.  Place your elbows under your shoulders. If this is too painful, stack pillows under your chest.  Allow your body to relax so that your hips drop lower and make contact more completely with the floor.  Hold this position for 15-20 seconds.  Slowly return to lying flat on the floor. Repeat 2 times. Complete this exercise 1-2 times per day.   RANGE OF MOTION - Extension, Prone Press Ups  Lie on your stomach on the floor, a bed will be too soft. Place your palms about shoulder width apart and at the height of your head.  Keeping your back as relaxed as possible, slowly straighten your elbows while keeping your hips on the floor. You may adjust the placement of your hands to maximize your comfort. As you gain motion, your hands will come more underneath your shoulders.  Hold this position 15-20 seconds.  Slowly return to lying flat on the floor. Repeat 2 times. Complete this exercise 1-2 times per day.   RANGE OF MOTION- Quadruped, Neutral Spine   Assume a hands and knees position on a firm surface. Keep your hands under your shoulders and your knees under your hips. You may place padding under your knees for comfort.  Drop your head and point your tailbone toward the ground below you. This will round out your lower back like an angry cat. Hold this position for 15-20 seconds.  Slowly lift your head and release your tail bone so that your back sags into a large arch, like an old horse.  Hold this position for 15-20 seconds.  Repeat this until you feel limber in your low back.  Now, find your "sweet spot." This will be the most comfortable position somewhere between the two previous positions. This is your neutral spine. Once you have found this position, tense your stomach muscles to support your low back.  Hold this  position for 15-20 seconds. Repeat 2 times. Complete this exercise 1-2 times per day.  STRENGTHENING EXERCISES - Low Back Sprain These exercises may help you when beginning to rehabilitate your injury. These exercises should be done near your "sweet spot." This is the neutral, low-back arch, somewhere between fully rounded and fully arched, that is your least painful position. When performed in this safe range of motion, these exercises can be used for people who have either a flexion or extension based injury. These exercises may resolve your symptoms with or without further involvement from your physician, physical therapist or athletic trainer. While completing these exercises, remember:   Muscles can gain both the endurance and the strength needed for everyday activities through controlled exercises.  Complete these exercises as instructed by your physician, physical therapist or athletic trainer. Increase the resistance and repetitions only as guided.  You may experience muscle soreness or  fatigue, but the pain or discomfort you are trying to eliminate should never worsen during these exercises. If this pain does worsen, stop and make certain you are following the directions exactly. If the pain is still present after adjustments, discontinue the exercise until you can discuss the trouble with your caregiver.  STRENGTHENING - Deep Abdominals, Pelvic Tilt   Lie on a firm bed or floor. Keeping your legs in front of you, bend your knees so they are both pointed toward the ceiling and your feet are flat on the floor.  Tense your lower abdominal muscles to press your low back into the floor. This motion will rotate your pelvis so that your tail bone is scooping upwards rather than pointing at your feet or into the floor. With a gentle tension and even breathing, hold this position for 10-15 seconds. Repeat 2 times. Complete this exercise 1 time per day.   STRENGTHENING - Abdominals, Crunches    Lie on a firm bed or floor. Keeping your legs in front of you, bend your knees so they are both pointed toward the ceiling and your feet are flat on the floor. Cross your arms over your chest.  Slightly tip your chin down without bending your neck.  Tense your abdominals and slowly lift your trunk high enough to just clear your shoulder blades. Lifting higher can put excessive stress on the lower back and does not further strengthen your abdominal muscles.  Control your return to the starting position. Repeat 2 times. Complete this exercise once every 1-2 days.   STRENGTHENING - Quadruped, Opposite UE/LE Lift   Assume a hands and knees position on a firm surface. Keep your hands under your shoulders and your knees under your hips. You may place padding under your knees for comfort.  Find your neutral spine and gently tense your abdominal muscles so that you can maintain this position. Your shoulders and hips should form a rectangle that is parallel with the floor and is not twisted.  Keeping your trunk steady, lift your right hand no higher than your shoulder and then your left leg no higher than your hip. Make sure you are not holding your breath. Hold this position for 15-20 seconds.  Continuing to keep your abdominal muscles tense and your back steady, slowly return to your starting position. Repeat with the opposite arm and leg. Repeat 2 times. Complete this exercise once every 1-2 days.   STRENGTHENING - Abdominals and Quadriceps, Straight Leg Raise   Lie on a firm bed or floor with both legs extended in front of you.  Keeping one leg in contact with the floor, bend the other knee so that your foot can rest flat on the floor.  Find your neutral spine, and tense your abdominal muscles to maintain your spinal position throughout the exercise.  Slowly lift your straight leg off the floor about 6 inches for a count of 15, making sure to not hold your breath.  Still keeping your  neutral spine, slowly lower your leg all the way to the floor. Repeat this exercise with each leg 2 times. Complete this exercise once every 1-2 days. POSTURE AND BODY MECHANICS CONSIDERATIONS - Low Back Sprain Keeping correct posture when sitting, standing or completing your activities will reduce the stress put on different body tissues, allowing injured tissues a chance to heal and limiting painful experiences. The following are general guidelines for improved posture. Your physician or physical therapist will provide you with any instructions specific to  your needs. While reading these guidelines, remember:  The exercises prescribed by your provider will help you have the flexibility and strength to maintain correct postures.  The correct posture provides the best environment for your joints to work. All of your joints have less wear and tear when properly supported by a spine with good posture. This means you will experience a healthier, less painful body.  Correct posture must be practiced with all of your activities, especially prolonged sitting and standing. Correct posture is as important when doing repetitive low-stress activities (typing) as it is when doing a single heavy-load activity (lifting).  RESTING POSITIONS Consider which positions are most painful for you when choosing a resting position. If you have pain with flexion-based activities (sitting, bending, stooping, squatting), choose a position that allows you to rest in a less flexed posture. You would want to avoid curling into a fetal position on your side. If your pain worsens with extension-based activities (prolonged standing, working overhead), avoid resting in an extended position such as sleeping on your stomach. Most people will find more comfort when they rest with their spine in a more neutral position, neither too rounded nor too arched. Lying on a non-sagging bed on your side with a pillow between your knees, or on your  back with a pillow under your knees will often provide some relief. Keep in mind, being in any one position for a prolonged period of time, no matter how correct your posture, can still lead to stiffness. PROPER SITTING POSTURE In order to minimize stress and discomfort on your spine, you must sit with correct posture. Sitting with good posture should be effortless for a healthy body. Returning to good posture is a gradual process. Many people can work toward this most comfortably by using various supports until they have the flexibility and strength to maintain this posture on their own. When sitting with proper posture, your ears will fall over your shoulders and your shoulders will fall over your hips. You should use the back of the chair to support your upper back. Your lower back will be in a neutral position, just slightly arched. You may place a small pillow or folded towel at the base of your lower back for  support.  When working at a desk, create an environment that supports good, upright posture. Without extra support, muscles tire, which leads to excessive strain on joints and other tissues. Keep these recommendations in mind:  CHAIR:  A chair should be able to slide under your desk when your back makes contact with the back of the chair. This allows you to work closely.  The chair's height should allow your eyes to be level with the upper part of your monitor and your hands to be slightly lower than your elbows.  BODY POSITION  Your feet should make contact with the floor. If this is not possible, use a foot rest.  Keep your ears over your shoulders. This will reduce stress on your neck and low back.  INCORRECT SITTING POSTURES  If you are feeling tired and unable to assume a healthy sitting posture, do not slouch or slump. This puts excessive strain on your back tissues, causing more damage and pain. Healthier options include:  Using more support, like a lumbar  pillow.  Switching tasks to something that requires you to be upright or walking.  Talking a brief walk.  Lying down to rest in a neutral-spine position.  PROLONGED STANDING WHILE SLIGHTLY LEANING FORWARD  When  completing a task that requires you to lean forward while standing in one place for a long time, place either foot up on a stationary 2-4 inch high object to help maintain the best posture. When both feet are on the ground, the lower back tends to lose its slight inward curve. If this curve flattens (or becomes too large), then the back and your other joints will experience too much stress, tire more quickly, and can cause pain.  CORRECT STANDING POSTURES Proper standing posture should be assumed with all daily activities, even if they only take a few moments, like when brushing your teeth. As in sitting, your ears should fall over your shoulders and your shoulders should fall over your hips. You should keep a slight tension in your abdominal muscles to brace your spine. Your tailbone should point down to the ground, not behind your body, resulting in an over-extended swayback posture.   INCORRECT STANDING POSTURES  Common incorrect standing postures include a forward head, locked knees and/or an excessive swayback. WALKING Walk with an upright posture. Your ears, shoulders and hips should all line-up.  PROLONGED ACTIVITY IN A FLEXED POSITION When completing a task that requires you to bend forward at your waist or lean over a low surface, try to find a way to stabilize 3 out of 4 of your limbs. You can place a hand or elbow on your thigh or rest a knee on the surface you are reaching across. This will provide you more stability, so that your muscles do not tire as quickly. By keeping your knees relaxed, or slightly bent, you will also reduce stress across your lower back. CORRECT LIFTING TECHNIQUES  DO :  Assume a wide stance. This will provide you more stability and the opportunity  to get as close as possible to the object which you are lifting.  Tense your abdominals to brace your spine. Bend at the knees and hips. Keeping your back locked in a neutral-spine position, lift using your leg muscles. Lift with your legs, keeping your back straight.  Test the weight of unknown objects before attempting to lift them.  Try to keep your elbows locked down at your sides in order get the best strength from your shoulders when carrying an object.     Always ask for help when lifting heavy or awkward objects. INCORRECT LIFTING TECHNIQUES DO NOT:   Lock your knees when lifting, even if it is a small object.  Bend and twist. Pivot at your feet or move your feet when needing to change directions.  Assume that you can safely pick up even a paperclip without proper posture.

## 2017-07-19 NOTE — Progress Notes (Signed)
Pre visit review using our clinic review tool, if applicable. No additional management support is needed unless otherwise documented below in the visit note. 

## 2017-07-21 ENCOUNTER — Ambulatory Visit (INDEPENDENT_AMBULATORY_CARE_PROVIDER_SITE_OTHER): Payer: Medicare PPO | Admitting: Podiatry

## 2017-07-21 ENCOUNTER — Encounter: Payer: Self-pay | Admitting: Podiatry

## 2017-07-21 DIAGNOSIS — B351 Tinea unguium: Secondary | ICD-10-CM

## 2017-07-21 DIAGNOSIS — M79676 Pain in unspecified toe(s): Secondary | ICD-10-CM

## 2017-07-21 NOTE — Progress Notes (Signed)
Complaint:  Visit Type: Patient returns to my office for continued preventative foot care services. Complaint: Patient states" my nails have grown long and thick and become painful to walk and wear shoes" Patient says his calluses are not painful. The patient presents for preventative foot care services. No changes to ROS  Podiatric Exam: Vascular: dorsalis pedis and posterior tibial pulses are palpable bilateral. Capillary return is immediate. Temperature gradient is WNL. Skin turgor WNL  Sensorium: Normal Semmes Weinstein monofilament test. Normal tactile sensation bilaterally. Nail Exam: Pt has thick disfigured discolored nails with subungual debris noted bilateral entire nail hallux through fifth toenails Ulcer Exam: There is no evidence of ulcer or pre-ulcerative changes or infection. Orthopedic Exam: Muscle tone and strength are WNL. No limitations in general ROM. No crepitus or effusions noted. Foot type and digits show no abnormalities. Bony prominences are unremarkable. Skin: No Porokeratosis. No infection or ulcers.Callus sub 1,5 right foot.  Diagnosis:  Onychomycosis, , Pain in right toe, pain in left toes  Treatment & Plan Procedures and Treatment: Consent by patient was obtained for treatment procedures.   Debridement of mycotic and hypertrophic toenails, 1 through 5 bilateral and clearing of subungual debris. No ulceration, no infection noted.  Return Visit-Office Procedure: Patient instructed to return to the office for a follow up visit 3 months for continued evaluation and treatment.    Gardiner Barefoot DPM

## 2017-08-10 ENCOUNTER — Telehealth: Payer: Self-pay | Admitting: *Deleted

## 2017-08-10 NOTE — Telephone Encounter (Signed)
Received Medical records [x3] from [facility]; forwarded to provider/SLS 10/23

## 2017-08-12 ENCOUNTER — Encounter: Payer: Self-pay | Admitting: Family Medicine

## 2017-10-22 ENCOUNTER — Encounter: Payer: Self-pay | Admitting: Family Medicine

## 2017-10-22 ENCOUNTER — Ambulatory Visit: Payer: Medicare PPO | Admitting: Family Medicine

## 2017-10-22 VITALS — BP 124/82 | HR 82 | Temp 98.0°F | Ht 73.0 in | Wt 192.5 lb

## 2017-10-22 DIAGNOSIS — E7800 Pure hypercholesterolemia, unspecified: Secondary | ICD-10-CM | POA: Diagnosis not present

## 2017-10-22 DIAGNOSIS — I10 Essential (primary) hypertension: Secondary | ICD-10-CM | POA: Diagnosis not present

## 2017-10-22 MED ORDER — LISINOPRIL 10 MG PO TABS: ORAL_TABLET | ORAL | 2 refills | Status: DC

## 2017-10-22 NOTE — Progress Notes (Signed)
Pre visit review using our clinic review tool, if applicable. No additional management support is needed unless otherwise documented below in the visit note. 

## 2017-10-22 NOTE — Progress Notes (Signed)
Chief Complaint  Patient presents with  . Follow-up    Subjective Joshua [name] is a 82 y.o. male who presents for hypertension follow up. He does monitor home blood pressures. Blood pressures ranging from 120's/70's on average. He is compliant with medications- Lisinopril 10 mg every other day and 20 mg the other days. Patient has these side effects of medication: none He is adhering to a healthy diet overall. Current exercise: None  Hyperlipidemia Patient presents for dyslipidemia follow up. Currently being treated with Lipitor 20 mg/d and compliance with treatment thus far has been good. He denies myalgias. He is adhering to a healthy. The patient exercises never.  The patient is not known to have coexisting coronary artery disease.    Past Medical History:  Diagnosis Date  . Arthritis   . Cataract   . Esophageal stricture   . GERD (gastroesophageal reflux disease)   . Heart murmur   . Hyperlipidemia   . Hypertension   . Liver disease    hepatitis   Allergies No Known Allergies  Review of Systems Cardiovascular: no chest pain Respiratory:  no shortness of breath  Exam BP 124/82 (BP Location: Left Arm, Patient Position: Sitting, Cuff Size: Normal)   Pulse 82   Temp 98 F (36.7 C) (Oral)   Ht 6\' 1"  (1.854 m)   Wt 192 lb 8 oz (87.3 kg)   SpO2 95%   BMI 25.40 kg/m  General:  well developed, well nourished, in no apparent distress Skin: warm, no pallor or diaphoresis Eyes: pupils equal and round, sclera anicteric without injection Heart: RRR, +SEM, no LE edema Lungs: clear to auscultation, no accessory muscle use Psych: well oriented with normal range of affect and appropriate judgment/insight  Essential hypertension  Hypercholesterolemia  Cont meds.  Counseled on diet and exercise. F/u in 6 mo. The patient voiced understanding and agreement to the plan.  [place], DO 10/22/17  3:58 PM

## 2017-10-22 NOTE — Patient Instructions (Signed)
Keep up the good work.   OK to check blood pressures as much or as little as you please.  Let us know if you need anything.

## 2017-11-03 ENCOUNTER — Encounter: Payer: Self-pay | Admitting: Podiatry

## 2017-11-03 ENCOUNTER — Ambulatory Visit: Payer: Medicare PPO | Admitting: Podiatry

## 2017-11-03 DIAGNOSIS — B351 Tinea unguium: Secondary | ICD-10-CM

## 2017-11-03 DIAGNOSIS — M79676 Pain in unspecified toe(s): Secondary | ICD-10-CM | POA: Diagnosis not present

## 2017-11-03 NOTE — Progress Notes (Signed)
Complaint:  Visit Type: Patient returns to my office for continued preventative foot care services. Complaint: Patient states" my nails have grown long and thick and become painful to walk and wear shoes" Patient says his calluses are not painful. The patient presents for preventative foot care services. No changes to ROS  Podiatric Exam: Vascular: dorsalis pedis and posterior tibial pulses are palpable bilateral. Capillary return is immediate. Temperature gradient is WNL. Skin turgor WNL  Sensorium: Normal Semmes Weinstein monofilament test. Normal tactile sensation bilaterally. Nail Exam: Pt has thick disfigured discolored nails with subungual debris noted bilateral entire nail hallux through fifth toenails Ulcer Exam: There is no evidence of ulcer or pre-ulcerative changes or infection. Orthopedic Exam: Muscle tone and strength are WNL. No limitations in general ROM. No crepitus or effusions noted. Foot type and digits show no abnormalities. Bony prominences are unremarkable. Skin: No Porokeratosis. No infection or ulcers.Callus sub 1,5 right foot asymptomatic.  Diagnosis:  Onychomycosis, , Pain in right toe, pain in left toes  Treatment & Plan Procedures and Treatment: Consent by patient was obtained for treatment procedures.   Debridement of mycotic and hypertrophic toenails, 1 through 5 bilateral and clearing of subungual debris. No ulceration, no infection noted.  Return Visit-Office Procedure: Patient instructed to return to the office for a follow up visit 3 months for continued evaluation and treatment.    Gardiner Barefoot DPM

## 2017-12-23 NOTE — Progress Notes (Signed)
Subjective:   Joshua [name] is a 82 y.o. male who presents for an Initial Medicare Annual Wellness Visit.  Review of Systems No ROS.  Medicare Wellness Visit. Additional risk factors are reflected in the social history.  Cardiac Risk Factors include: advanced age (>14men, >15 women);hypertension;male gender Sleep patterns: Sleeps 8-10 hrs per night. Still feel a little tired. Home Safety/Smoke Alarms: Feels safe in home. Smoke alarms in place.  Living environment; residence and Firearm Safety: Live in [place] with wife.  Seat Belt Safety/Bike Helmet: Wears seat belt.   Male:   CCS- Pt reports last 4-5 yrs ago in [place] -normal     PSA- No results found for: PSA  Eye- pt states he will schedule appt soon. Looking for new doctor.  Dental- Dr.Lane every 6 months.   Objective:    Today's Vitals   12/28/17 1028  BP: 118/60  Pulse: 89  SpO2: 97%  Weight: 196 lb 6.4 oz (89.1 kg)  Height: 6\' 1"  (1.854 m)   Body mass index is 25.91 kg/m.  Advanced Directives 12/28/2017 07/11/2016  Does Patient Have a Medical Advance Directive? No;Yes No  Type of Paramedic of [place];Living will -  Does patient want to make changes to medical advance directive? No - Patient declined -  Copy of [place] in Chart? No - copy requested -  Would patient like information on creating a medical advance directive? - No - patient declined information    Current Medications (verified) Outpatient Encounter Medications as of 12/28/2017  Medication Sig  . atorvastatin (LIPITOR) 20 MG tablet Take 1 by mouth every other day  . Glucosamine-Chondroit-Vit C-Mn (GLUCOSAMINE 1500 COMPLEX PO) Take by mouth daily.  Marland Kitchen lisinopril (PRINIVIL,ZESTRIL) 10 MG tablet Take one daily and 2 every other day  . Multiple Vitamin (MULTIVITAMIN WITH MINERALS) TABS tablet Take 1 tablet by mouth daily.  Marland Kitchen omeprazole (PRILOSEC) 40 MG capsule Take 1  By mouth every other day   No  facility-administered encounter medications on file as of 12/28/2017.     Allergies (verified) Patient has no known allergies.   History: Past Medical History:  Diagnosis Date  . Arthritis   . Cataract   . Esophageal stricture   . GERD (gastroesophageal reflux disease)   . Heart murmur   . Hyperlipidemia   . Hypertension   . Liver disease    hepatitis   Past Surgical History:  Procedure Laterality Date  . cataract  2013  . HEMORROIDECTOMY  2001  . HERNIA REPAIR  2005  . TONSILLECTOMY  1948   Family History  Problem Relation Age of Onset  . Heart failure Mother 19  . Heart failure Father 33  . Cancer Maternal Grandmother   . Diabetes Child   . Heart Problems Child 49       cardiac arrest   Social History   Socioeconomic History  . Marital status: Married    Spouse name: None  . Number of children: None  . Years of education: None  . Highest education level: None  Social Needs  . Financial resource strain: None  . Food insecurity - worry: None  . Food insecurity - inability: None  . Transportation needs - medical: None  . Transportation needs - non-medical: None  Occupational History  . None  Tobacco Use  . Smoking status: Former Smoker    Last attempt to quit: 1965    Years since quitting: 54.2  . Smokeless tobacco:  Never Used  Substance and Sexual Activity  . Alcohol use: Yes    Comment: daily a few cocktails  . Drug use: No  . Sexual activity: No  Other Topics Concern  . None  Social History Narrative   Epworth Sleepiness Scale = 3 (as of 07/15/2016)   Tobacco Counseling Counseling given: Not Answered   Clinical Intake: Pain : No/denies pain   Activities of Daily Living In your present state of health, do you have any difficulty performing the following activities: 12/28/2017  Hearing? N  Vision? N  Comment wears glasses. [place].  Difficulty concentrating or making decisions? N  Walking or climbing stairs? N  Dressing or bathing?  N  Doing errands, shopping? N  Preparing Food and eating ? N  Using the Toilet? N  In the past six months, have you accidently leaked urine? N  Do you have problems with loss of bowel control? N  Managing your Medications? N  Managing your Finances? N  Housekeeping or managing your Housekeeping? N  Some recent data might be hidden     Immunizations and Health Maintenance Immunization History  Administered Date(s) Administered  . Influenza, High Dose Seasonal PF 07/19/2017  . Influenza-Unspecified 07/20/2015   Health Maintenance Due  Topic Date Due  . TETANUS/TDAP  09/05/1957  . PNA vac Low Risk Adult (2 of 2 - PPSV23) 10/19/2016    Patient Care Team: Shelda Pal, DO as PCP - General (Family Medicine)  Indicate any recent Medical Services you may have received from other than [facility] providers in the past year (date may be approximate).    Assessment:   This is a routine wellness examination for Joshua [name]. Physical assessment deferred to PCP.  Hearing/Vision screen No exam data present  Dietary issues and exercise activities discussed: Current Exercise Habits: The patient does not participate in regular exercise at present, Exercise limited by: None identified Diet (meal preparation, eat out, water intake, caffeinated beverages, dairy products, fruits and vegetables): on average, 2 meals per day  Breakfast: Cereal or eggs or waffle. 2 cups coffee Lunch: Snack and bloody mary Dinner:    4 pm steak on a roll  Goals    . Maintain current health      Depression Screen PHQ 2/9 Scores 12/28/2017  PHQ - 2 Score 0    Fall Risk Fall Risk  12/28/2017  Falls in the past year? No     Cognitive Function: MMSE - Mini Mental State Exam 12/28/2017  Orientation to time 5  Orientation to Place 5  Registration 3  Attention/ Calculation 5  Recall 3  Language- name 2 objects 2  Language- repeat 1  Language- follow 3 step command 3  Language- read & follow direction 1    Write a sentence 1  Copy design 1  Total score 30        Screening Tests Health Maintenance  Topic Date Due  . TETANUS/TDAP  09/05/1957  . PNA vac Low Risk Adult (2 of 2 - PPSV23) 10/19/2016  . INFLUENZA VACCINE  Completed      Plan:   Follow up with Dr.Wendling as scheduled 04/22/18.  Continue to eat heart healthy diet (full of fruits, vegetables, whole grains, lean protein, water--limit salt, fat, and sugar intake) and increase physical activity as tolerated.  Continue doing brain stimulating activities (puzzles, reading, adult coloring books, staying active) to keep memory sharp.   Bring a copy of your living will and/or healthcare power of attorney to  your next office visit.  Please schedule appointment as discussed for eye exam  I have personally reviewed and noted the following in the patient's chart:   . Medical and social history . Use of alcohol, tobacco or illicit drugs  . Current medications and supplements . Functional ability and status . Nutritional status . Physical activity . Advanced directives . List of other physicians . Hospitalizations, surgeries, and ER visits in previous 12 months . Vitals . Screenings to include cognitive, depression, and falls . Referrals and appointments  In addition, I have reviewed and discussed with patient certain preventive protocols, quality metrics, and best practice recommendations. A written personalized care plan for preventive services as well as general preventive health recommendations were provided to patient.     Shela Nevin, [place]   12/28/2017

## 2017-12-28 ENCOUNTER — Encounter: Payer: Self-pay | Admitting: *Deleted

## 2017-12-28 ENCOUNTER — Ambulatory Visit (INDEPENDENT_AMBULATORY_CARE_PROVIDER_SITE_OTHER): Payer: Medicare PPO | Admitting: *Deleted

## 2017-12-28 VITALS — BP 118/60 | HR 89 | Ht 73.0 in | Wt 196.4 lb

## 2017-12-28 DIAGNOSIS — Z0000 Encounter for general adult medical examination without abnormal findings: Secondary | ICD-10-CM

## 2017-12-28 NOTE — Patient Instructions (Signed)
Follow up with Dr.Wendling as scheduled 04/22/18.  Continue to eat heart healthy diet (full of fruits, vegetables, whole grains, lean protein, water--limit salt, fat, and sugar intake) and increase physical activity as tolerated.  Continue doing brain stimulating activities (puzzles, reading, adult coloring books, staying active) to keep memory sharp.   Bring a copy of your living will and/or healthcare power of attorney to your next office visit.  Please schedule appointment as discussed for eye exam   [name] , Thank you for taking time to come for your Medicare Wellness Visit. I appreciate your ongoing commitment to your health goals. Please review the following plan we discussed and let me know if I can assist you in the future.   These are the goals we discussed: Goals    . Maintain current health       This is a list of the screening recommended for you and due dates:  Health Maintenance  Topic Date Due  . Tetanus Vaccine  09/05/1957  . Pneumonia vaccines (2 of 2 - PPSV23) 10/19/2016  . Flu Shot  Completed    Health Maintenance, Male A healthy lifestyle and preventive care is important for your health and wellness. Ask your health care provider about what schedule of regular examinations is right for you. What should I know about weight and diet? Eat a Healthy Diet  Eat plenty of vegetables, fruits, whole grains, low-fat dairy products, and lean protein.  Do not eat a lot of foods high in solid fats, added sugars, or salt.  Maintain a Healthy Weight Regular exercise can help you achieve or maintain a healthy weight. You should:  Do at least 150 minutes of exercise each week. The exercise should increase your heart rate and make you sweat (moderate-intensity exercise).  Do strength-training exercises at least twice a week.  Watch Your Levels of Cholesterol and Blood Lipids  Have your blood tested for lipids and cholesterol every 5 years starting at 80 years of  age. If you are at high risk for heart disease, you should start having your blood tested when you are 80 years old. You may need to have your cholesterol levels checked more often if: ? Your lipid or cholesterol levels are high. ? You are older than 80 years of age. ? You are at high risk for heart disease.  What should I know about cancer screening? Many types of cancers can be detected early and may often be prevented. Lung Cancer  You should be screened every year for lung cancer if: ? You are a current smoker who has smoked for at least 30 years. ? You are a former smoker who has quit within the past 15 years.  Talk to your health care provider about your screening options, when you should start screening, and how often you should be screened.  Colorectal Cancer  Routine colorectal cancer screening usually begins at 80 years of age and should be repeated every 5-10 years until you are 80 years old. You may need to be screened more often if early forms of precancerous polyps or small growths are found. Your health care provider may recommend screening at an earlier age if you have risk factors for colon cancer.  Your health care provider may recommend using home test kits to check for hidden blood in the stool.  A small camera at the end of a tube can be used to examine your colon (sigmoidoscopy or colonoscopy). This checks for the earliest forms of colorectal  cancer.  Prostate and Testicular Cancer  Depending on your age and overall health, your health care provider may do certain tests to screen for prostate and testicular cancer.  Talk to your health care provider about any symptoms or concerns you have about testicular or prostate cancer.  Skin Cancer  Check your skin from head to toe regularly.  Tell your health care provider about any new moles or changes in moles, especially if: ? There is a change in a mole's size, shape, or color. ? You have a mole that is larger than  a pencil eraser.  Always use sunscreen. Apply sunscreen liberally and repeat throughout the day.  Protect yourself by wearing long sleeves, pants, a wide-brimmed hat, and sunglasses when outside.  What should I know about heart disease, diabetes, and high blood pressure?  If you are 60-3 years of age, have your blood pressure checked every 3-5 years. If you are 35 years of age or older, have your blood pressure checked every year. You should have your blood pressure measured twice-once when you are at a hospital or clinic, and once when you are not at a hospital or clinic. Record the average of the two measurements. To check your blood pressure when you are not at a hospital or clinic, you can use: ? An automated blood pressure machine at a pharmacy. ? A home blood pressure monitor.  Talk to your health care provider about your target blood pressure.  If you are between 42-38 years old, ask your health care provider if you should take aspirin to prevent heart disease.  Have regular diabetes screenings by checking your fasting blood sugar level. ? If you are at a normal weight and have a low risk for diabetes, have this test once every three years after the age of 78. ? If you are overweight and have a high risk for diabetes, consider being tested at a younger age or more often.  A one-time screening for abdominal aortic aneurysm (AAA) by ultrasound is recommended for men aged 73-75 years who are current or former smokers. What should I know about preventing infection? Hepatitis B If you have a higher risk for hepatitis B, you should be screened for this virus. Talk with your health care provider to find out if you are at risk for hepatitis B infection. Hepatitis C Blood testing is recommended for:  Everyone born from [birth year] through 1965.  Anyone with known risk factors for hepatitis C.  Sexually Transmitted Diseases (STDs)  You should be screened each year for STDs including gonorrhea  and chlamydia if: ? You are sexually active and are younger than 80 years of age. ? You are older than 80 years of age and your health care provider tells you that you are at risk for this type of infection. ? Your sexual activity has changed since you were last screened and you are at an increased risk for chlamydia or gonorrhea. Ask your health care provider if you are at risk.  Talk with your health care provider about whether you are at high risk of being infected with HIV. Your health care provider may recommend a prescription medicine to help prevent HIV infection.  What else can I do?  Schedule regular health, dental, and eye exams.  Stay current with your vaccines (immunizations).  Do not use any tobacco products, such as cigarettes, chewing tobacco, and e-cigarettes. If you need help quitting, ask your health care provider.  Limit alcohol intake to no more  than 2 drinks per day. One drink equals 12 ounces of beer, 5 ounces of wine, or 1 ounces of hard liquor.  Do not use street drugs.  Do not share needles.  Ask your health care provider for help if you need support or information about quitting drugs.  Tell your health care provider if you often feel depressed.  Tell your health care provider if you have ever been abused or do not feel safe at home. This information is not intended to replace advice given to you by your health care provider. Make sure you discuss any questions you have with your health care provider. Document Released: 04/02/2008 Document Revised: 06/03/2016 Document Reviewed: 07/09/2015 Elsevier Interactive Patient Education  [employer].

## 2018-01-01 NOTE — Progress Notes (Signed)
Noted. Agree with above.  [place], DO 01/01/18 5:22 PM

## 2018-01-04 ENCOUNTER — Telehealth: Payer: Self-pay | Admitting: *Deleted

## 2018-01-04 NOTE — Telephone Encounter (Signed)
Received Medical records from [facility]; forwarded to provider/SLS 03/19

## 2018-01-20 ENCOUNTER — Telehealth: Payer: Self-pay | Admitting: Family Medicine

## 2018-01-20 NOTE — Telephone Encounter (Signed)
Copied from [place] [id number]. Topic: Quick Communication - See Telephone Encounter >> Jan 20, 2018 10:00 AM Rosalin Hawking wrote: CRM for notification. See Telephone encounter for: 01/20/18.   Pt dropped off copies of document for provider to see and have on pt's chart ([place]) Document put at front office tray under providers name.

## 2018-02-02 ENCOUNTER — Ambulatory Visit: Payer: Medicare PPO | Admitting: Podiatry

## 2018-02-25 ENCOUNTER — Encounter: Payer: Self-pay | Admitting: Podiatry

## 2018-02-25 ENCOUNTER — Ambulatory Visit: Payer: Medicare PPO | Admitting: Podiatry

## 2018-02-25 DIAGNOSIS — B351 Tinea unguium: Secondary | ICD-10-CM | POA: Diagnosis not present

## 2018-02-25 DIAGNOSIS — M79676 Pain in unspecified toe(s): Secondary | ICD-10-CM

## 2018-02-25 NOTE — Progress Notes (Signed)
Complaint:  Visit Type: Patient returns to my office for continued preventative foot care services. Complaint: Patient states" my nails have grown long and thick and become painful to walk and wear shoes" Patient says his calluses are not painful. The patient presents for preventative foot care services. No changes to ROS  Podiatric Exam: Vascular: dorsalis pedis and posterior tibial pulses are palpable bilateral. Capillary return is immediate. Temperature gradient is WNL. Skin turgor WNL  Sensorium: Normal Semmes Weinstein monofilament test. Normal tactile sensation bilaterally. Nail Exam: Pt has thick disfigured discolored nails with subungual debris noted bilateral entire nail hallux through fifth toenails Ulcer Exam: There is no evidence of ulcer or pre-ulcerative changes or infection. Orthopedic Exam: Muscle tone and strength are WNL. No limitations in general ROM. No crepitus or effusions noted. Foot type and digits show no abnormalities. Bony prominences are unremarkable. Skin: No Porokeratosis. No infection or ulcers.Callus sub 1,5 right foot asymptomatic. Asymptomatic heel callus right.  Diagnosis:  Onychomycosis, , Pain in right toe, pain in left toes  Treatment & Plan Procedures and Treatment: Consent by patient was obtained for treatment procedures.   Debridement of mycotic and hypertrophic toenails, 1 through 5 bilateral and clearing of subungual debris. No ulceration, no infection noted.  Return Visit-Office Procedure: Patient instructed to return to the office for a follow up visit 3 months for continued evaluation and treatment.    Gardiner Barefoot DPM

## 2018-04-22 ENCOUNTER — Encounter: Payer: Self-pay | Admitting: Family Medicine

## 2018-04-22 ENCOUNTER — Ambulatory Visit: Payer: Medicare PPO | Admitting: Family Medicine

## 2018-04-22 VITALS — BP 108/66 | HR 89 | Temp 97.8°F | Ht 73.0 in | Wt 188.2 lb

## 2018-04-22 DIAGNOSIS — E7800 Pure hypercholesterolemia, unspecified: Secondary | ICD-10-CM

## 2018-04-22 DIAGNOSIS — K219 Gastro-esophageal reflux disease without esophagitis: Secondary | ICD-10-CM | POA: Diagnosis not present

## 2018-04-22 DIAGNOSIS — I10 Essential (primary) hypertension: Secondary | ICD-10-CM

## 2018-04-22 DIAGNOSIS — Z23 Encounter for immunization: Secondary | ICD-10-CM | POA: Diagnosis not present

## 2018-04-22 LAB — LIPID PANEL
CHOL/HDL RATIO: 3
Cholesterol: 210 mg/dL — ABNORMAL HIGH (ref 0–200)
HDL: 78.5 mg/dL (ref >39.00)
LDL Cholesterol: 110 mg/dL — ABNORMAL HIGH (ref 0–99)
NonHDL: 131.93
TRIGLYCERIDES: 109 mg/dL (ref 0.0–149.0)
VLDL: 21.8 mg/dL (ref 0.0–40.0)

## 2018-04-22 LAB — COMPREHENSIVE METABOLIC PANEL
ALK PHOS: 55 U/L (ref 39–117)
ALT: 16 U/L (ref 0–53)
AST: 26 U/L (ref 0–37)
Albumin: 4.4 g/dL (ref 3.5–5.2)
BILIRUBIN TOTAL: 1.5 mg/dL — AB (ref 0.2–1.2)
BUN: 12 mg/dL (ref 6–23)
CALCIUM: 9.3 mg/dL (ref 8.4–10.5)
CO2: 27 meq/L (ref 19–32)
Chloride: 102 mEq/L (ref 96–112)
Creatinine, Ser: 0.86 mg/dL (ref 0.40–1.50)
GFR: 91.03 mL/min (ref >60.00)
GLUCOSE: 94 mg/dL (ref 70–99)
POTASSIUM: 4.7 meq/L (ref 3.5–5.1)
Sodium: 139 mEq/L (ref 135–145)
Total Protein: 7.1 g/dL (ref 6.0–8.3)

## 2018-04-22 MED ORDER — ATORVASTATIN CALCIUM 20 MG PO TABS: 20.0000 mg | ORAL_TABLET | Freq: Every day | ORAL | 2 refills | Status: DC

## 2018-04-22 MED ORDER — OMEPRAZOLE 40 MG PO CPDR: 40.0000 mg | DELAYED_RELEASE_CAPSULE | Freq: Every day | ORAL | 2 refills | Status: DC

## 2018-04-22 MED ORDER — LISINOPRIL 10 MG PO TABS: 10.0000 mg | ORAL_TABLET | Freq: Every day | ORAL | 2 refills | Status: DC

## 2018-04-22 NOTE — Patient Instructions (Addendum)
Keep up the good work.  Let me know if your home readings increase and we can always go back to your previous dosing.   Let us know if you need anything.

## 2018-04-22 NOTE — Progress Notes (Signed)
Pre visit review using our clinic review tool, if applicable. No additional management support is needed unless otherwise documented below in the visit note. 

## 2018-04-22 NOTE — Addendum Note (Signed)
Addended by: Sharon Seller B on: 04/22/2018 08:59 AM   Modules accepted: Orders

## 2018-04-22 NOTE — Progress Notes (Addendum)
Chief Complaint  Patient presents with  . Follow-up    Subjective Joshua [name] is a 82 y.o. male who presents for hypertension follow up. He does monitor home blood pressures. Blood pressures ranging from 120's/70's on average. He is compliant with medication- lisinopril 10 mg/d. Patient has these side effects of medication: none He is adhering to a healthy diet overall. Current exercise: will golf every once in a while  Hyperlipidemia Patient presents for dyslipidemia follow up. Currently being treated with Lipitor 20 mg/d and compliance with treatment thus far has been good. He denies myalgias. He is adhering to a healthy. The patient exercises- will golf every once in while The patient is not known to have coexisting coronary artery disease.  GERD Doing well on omeprazole 40 mg/d. Has tried ppi in past, but did not do well with his symptoms.   Past Medical History:  Diagnosis Date  . Arthritis   . Cataract   . Esophageal stricture   . GERD (gastroesophageal reflux disease)   . Heart murmur   . Hyperlipidemia   . Hypertension   . Liver disease    hepatitis   Family History  Problem Relation Age of Onset  . Heart failure Mother 57  . Heart failure Father 77  . Cancer Maternal Grandmother   . Diabetes Child   . Heart Problems Child 49       cardiac arrest   Allergies as of 04/22/2018   No Known Allergies     Medication List        Accurate as of 04/22/18  8:47 AM. Always use your most recent med list.          atorvastatin 20 MG tablet Commonly known as:  LIPITOR Take 1 tablet (20 mg total) by mouth daily at 6 PM. Take 1 by mouth every other day   GLUCOSAMINE 1500 COMPLEX PO Take by mouth daily.   lisinopril 10 MG tablet Commonly known as:  PRINIVIL,ZESTRIL Take 1 tablet (10 mg total) by mouth daily. Take one daily and 2 every other day   multivitamin with minerals Tabs tablet Take 1 tablet by mouth daily.   omeprazole 40 MG capsule Commonly  known as:  PRILOSEC Take 1 capsule (40 mg total) by mouth daily. Take 1  By mouth every other day      Review of Systems Cardiovascular: no chest pain Respiratory:  no shortness of breath  Exam BP 108/66 (BP Location: Left Arm, Patient Position: Sitting, Cuff Size: Normal)   Pulse 89   Temp 97.8 F (36.6 C) (Oral)   Ht 6\' 1"  (1.854 m)   Wt 188 lb 4 oz (85.4 kg)   BMI 24.84 kg/m  General:  well developed, well nourished, in no apparent distress Heart: RRR, +SEM, no LE edema Lungs: clear to auscultation, no accessory muscle use Psych: well oriented with normal range of affect and appropriate judgment/insight  Essential hypertension - Plan: Comprehensive metabolic panel, lisinopril (PRINIVIL,ZESTRIL) 10 MG tablet  Hypercholesterolemia - Plan: Lipid panel, atorvastatin (LIPITOR) 20 MG tablet  Gastroesophageal reflux disease, esophagitis presence not specified - Plan: omeprazole (PRILOSEC) 40 MG capsule  Orders as above. Counseled on diet and exercise. Imms today. Decrease ACEi to 10 mg/d from alternating 10 and 20 mg/d.  Cont Lipitor. Cont PPI. F/u in 6 mo. The patient voiced understanding and agreement to the plan.  [place], DO 04/22/18  8:42 AM

## 2018-04-27 ENCOUNTER — Telehealth: Payer: Self-pay | Admitting: *Deleted

## 2018-04-27 NOTE — Telephone Encounter (Signed)
[employer] sent a fax asking for clarification on sig for omeprazole.  1 qd or 1 qod.

## 2018-04-27 NOTE — Telephone Encounter (Signed)
Once daily. TY.

## 2018-04-27 NOTE — Telephone Encounter (Signed)
Patient stated the [facility] can be reached at [phone] for this matter.

## 2018-04-28 ENCOUNTER — Other Ambulatory Visit: Payer: Self-pay

## 2018-04-28 DIAGNOSIS — E7800 Pure hypercholesterolemia, unspecified: Secondary | ICD-10-CM

## 2018-04-28 DIAGNOSIS — K219 Gastro-esophageal reflux disease without esophagitis: Secondary | ICD-10-CM

## 2018-04-28 MED ORDER — OMEPRAZOLE 40 MG PO CPDR: 40.0000 mg | DELAYED_RELEASE_CAPSULE | Freq: Every day | ORAL | 2 refills | Status: DC

## 2018-04-28 MED ORDER — ATORVASTATIN CALCIUM 20 MG PO TABS: 20.0000 mg | ORAL_TABLET | Freq: Every day | ORAL | 2 refills | Status: DC

## 2018-04-28 NOTE — Telephone Encounter (Signed)
Medication orders revised per Dr. Nani Ravens. [employer] notified.

## 2018-05-02 ENCOUNTER — Telehealth: Payer: Self-pay | Admitting: Family Medicine

## 2018-05-02 NOTE — Telephone Encounter (Signed)
Copied from [place] [id number] [phone]. Topic: Quick Communication - Rx Refill/Question >> Apr 25, 2018  3:11 PM Scherrie Gerlach wrote: Medication: Pt states he got an email from [place] today asking him to call his dr, as they have questions about his prescriptions. Pt states [employer] advised in email they have sent several faxes concerning this.  Pt is requesting you call [employer] to find out what they need

## 2018-05-20 ENCOUNTER — Ambulatory Visit: Payer: Medicare PPO | Admitting: Podiatry

## 2018-06-17 ENCOUNTER — Ambulatory Visit (INDEPENDENT_AMBULATORY_CARE_PROVIDER_SITE_OTHER): Payer: Medicare PPO | Admitting: Podiatry

## 2018-06-17 ENCOUNTER — Encounter: Payer: Self-pay | Admitting: Podiatry

## 2018-06-17 DIAGNOSIS — B351 Tinea unguium: Secondary | ICD-10-CM | POA: Diagnosis not present

## 2018-06-17 DIAGNOSIS — M79676 Pain in unspecified toe(s): Secondary | ICD-10-CM

## 2018-06-17 NOTE — Progress Notes (Signed)
Complaint:  Visit Type: Patient returns to my office for continued preventative foot care services. Complaint: Patient states" my nails have grown long and thick and become painful to walk and wear shoes" Patient says his calluses are not painful. The patient presents for preventative foot care services. No changes to ROS  Podiatric Exam: Vascular: dorsalis pedis and posterior tibial pulses are palpable bilateral. Capillary return is immediate. Temperature gradient is WNL. Skin turgor WNL  Sensorium: Normal Semmes Weinstein monofilament test. Normal tactile sensation bilaterally. Nail Exam: Pt has thick disfigured discolored nails with subungual debris noted bilateral entire nail hallux through fifth toenails Ulcer Exam: There is no evidence of ulcer or pre-ulcerative changes or infection. Orthopedic Exam: Muscle tone and strength are WNL. No limitations in general ROM. No crepitus or effusions noted. Foot type and digits show no abnormalities. Bony prominences are unremarkable. Skin: No Porokeratosis. No infection or ulcers.Callus sub 1,5 right foot asymptomatic. Asymptomatic heel callus right.  Diagnosis:  Onychomycosis, , Pain in right toe, pain in left toes  Treatment & Plan Procedures and Treatment: Consent by patient was obtained for treatment procedures.   Debridement of mycotic and hypertrophic toenails, 1 through 5 bilateral and clearing of subungual debris. No ulceration, no infection noted. Iatrogenic lesion cauterized. Return Visit-Office Procedure: Patient instructed to return to the office for a follow up visit 3 months for continued evaluation and treatment.    Lexys Milliner DPM 

## 2018-09-23 ENCOUNTER — Encounter: Payer: Self-pay | Admitting: Podiatry

## 2018-09-23 ENCOUNTER — Ambulatory Visit (INDEPENDENT_AMBULATORY_CARE_PROVIDER_SITE_OTHER): Payer: Medicare PPO | Admitting: Podiatry

## 2018-09-23 DIAGNOSIS — B351 Tinea unguium: Secondary | ICD-10-CM

## 2018-09-23 DIAGNOSIS — M79676 Pain in unspecified toe(s): Secondary | ICD-10-CM

## 2018-09-23 NOTE — Progress Notes (Signed)
Complaint:  Visit Type: Patient returns to my office for continued preventative foot care services. Complaint: Patient states" my nails have grown long and thick and become painful to walk and wear shoes" Patient says his calluses are not painful. The patient presents for preventative foot care services. No changes to ROS  Podiatric Exam: Vascular: dorsalis pedis and posterior tibial pulses are palpable bilateral. Capillary return is immediate. Temperature gradient is WNL. Skin turgor WNL  Sensorium: Normal Semmes Weinstein monofilament test. Normal tactile sensation bilaterally. Nail Exam: Pt has thick disfigured discolored nails with subungual debris noted bilateral entire nail hallux through fifth toenails Ulcer Exam: There is no evidence of ulcer or pre-ulcerative changes or infection. Orthopedic Exam: Muscle tone and strength are WNL. No limitations in general ROM. No crepitus or effusions noted. Foot type and digits show no abnormalities. Bony prominences are unremarkable. Skin: No Porokeratosis. No infection or ulcers.Callus sub 1,5 right foot asymptomatic. Asymptomatic heel callus right.  Diagnosis:  Onychomycosis, , Pain in right toe, pain in left toes  Treatment & Plan Procedures and Treatment: Consent by patient was obtained for treatment procedures.   Debridement of mycotic and hypertrophic toenails, 1 through 5 bilateral and clearing of subungual debris. No ulceration, no infection noted. Iatrogenic lesion cauterized. Return Visit-Office Procedure: Patient instructed to return to the office for a follow up visit 3 months for continued evaluation and treatment.    Gardiner Barefoot DPM

## 2018-10-21 ENCOUNTER — Encounter: Payer: Self-pay | Admitting: Family Medicine

## 2018-10-21 ENCOUNTER — Ambulatory Visit (INDEPENDENT_AMBULATORY_CARE_PROVIDER_SITE_OTHER): Payer: Medicare HMO | Admitting: Family Medicine

## 2018-10-21 VITALS — BP 120/78 | HR 94 | Temp 98.4°F | Ht 73.0 in | Wt 192.4 lb

## 2018-10-21 DIAGNOSIS — K219 Gastro-esophageal reflux disease without esophagitis: Secondary | ICD-10-CM | POA: Diagnosis not present

## 2018-10-21 DIAGNOSIS — E7800 Pure hypercholesterolemia, unspecified: Secondary | ICD-10-CM

## 2018-10-21 DIAGNOSIS — I10 Essential (primary) hypertension: Secondary | ICD-10-CM

## 2018-10-21 NOTE — Patient Instructions (Addendum)
Keep the diet clean and stay active.  Let us know if you need anything.  Knee Exercises It is normal to feel mild stretching, pulling, tightness, or discomfort as you do these exercises, but you should stop right away if you feel sudden pain or your pain gets worse. STRETCHING AND RANGE OF MOTION EXERCISES  These exercises warm up your muscles and joints and improve the movement and flexibility of your knee. These exercises also help to relieve pain, numbness, and tingling. Exercise A: Knee Extension, Prone  1. Lie on your abdomen on a bed. 2. Place your left / right knee just beyond the edge of the surface so your knee is not on the bed. You can put a towel under your left / right thigh just above your knee for comfort. 3. Relax your leg muscles and allow gravity to straighten your knee. You should feel a stretch behind your left / right knee. 4. Hold this position for 30 seconds. 5. Scoot up so your knee is supported between repetitions. Repeat 2 times. Complete this stretch 3 times per week. Exercise B: Knee Flexion, Active    1. Lie on your back with both knees straight. If this causes back discomfort, bend your left / right knee so your foot is flat on the floor. 2. Slowly slide your left / right heel back toward your buttocks until you feel a gentle stretch in the front of your knee or thigh. 3. Hold this position for 30 seconds. 4. Slowly slide your left / right heel back to the starting position. Repeat 2 times. Complete this exercise 3 times per week. Exercise C: Quadriceps, Prone    1. Lie on your abdomen on a firm surface, such as a bed or padded floor. 2. Bend your left / right knee and hold your ankle. If you cannot reach your ankle or pant leg, loop a belt around your foot and grab the belt instead. 3. Gently pull your heel toward your buttocks. Your knee should not slide out to the side. You should feel a stretch in the front of your thigh and knee. 4. Hold this  position for 30 seconds. Repeat 2 times. Complete this stretch 3 times per week. Exercise D: Hamstring, Supine  1. Lie on your back. 2. Loop a belt or towel over the ball of your left / right foot. The ball of your foot is on the walking surface, right under your toes. 3. Straighten your left / right knee and slowly pull on the belt to raise your leg until you feel a gentle stretch behind your knee. ? Do not let your left / right knee bend while you do this. ? Keep your other leg flat on the floor. 4. Hold this position for 30 seconds. Repeat 2 times. Complete this stretch 3 times per week. STRENGTHENING EXERCISES  These exercises build strength and endurance in your knee. Endurance is the ability to use your muscles for a long time, even after they get tired. Exercise E: Quadriceps, Isometric    1. Lie on your back with your left / right leg extended and your other knee bent. Put a rolled towel or small pillow under your knee if told by your health care provider. 2. Slowly tense the muscles in the front of your left / right thigh. You should see your kneecap slide up toward your hip or see increased dimpling just above the knee. This motion will push the back of the knee toward the floor.  3. For 3 seconds, keep the muscle as tight as you can without increasing your pain. 4. Relax the muscles slowly and completely. Repeat for 10 total reps Repeat 2 ti mes. Complete this exercise 3 times per week. Exercise F: Straight Leg Raises - Quadriceps  1. Lie on your back with your left / right leg extended and your other knee bent. 2. Tense the muscles in the front of your left / right thigh. You should see your kneecap slide up or see increased dimpling just above the knee. Your thigh may even shake a bit. 3. Keep these muscles tight as you raise your leg 4-6 inches (10-15 cm) off the floor. Do not let your knee bend. 4. Hold this position for 3 seconds. 5. Keep these muscles tense as you lower your  leg. 6. Relax your muscles slowly and completely after each repetition. 10 total reps. Repeat 2 times. Complete this exercise 3 times per week.  Exercise G: Hamstring Curls    If told by your health care provider, do this exercise while wearing ankle weights. Begin with 5 lb weights (optional). Then increase the weight by 1 lb (0.5 kg) increments. Do not wear ankle weights that are more than 20 lbs to start with. 1. Lie on your abdomen with your legs straight. 2. Bend your left / right knee as far as you can without feeling pain. Keep your hips flat against the floor. 3. Hold this position for 3 seconds. 4. Slowly lower your leg to the starting position. Repeat for 10 reps.  Repeat 2 times. Complete this exercise 3 times per week. Exercise H: Squats (Quadriceps)  1. Stand in front of a table, with your feet and knees pointing straight ahead. You may rest your hands on the table for balance but not for support. 2. Slowly bend your knees and lower your hips like you are going to sit in a chair. ? Keep your weight over your heels, not over your toes. ? Keep your lower legs upright so they are parallel with the table legs. ? Do not let your hips go lower than your knees. ? Do not bend lower than told by your health care provider. ? If your knee pain increases, do not bend as low. 3. Hold the squat position for 1 second. 4. Slowly push with your legs to return to standing. Do not use your hands to pull yourself to standing. Repeat 2 times. Complete this exercise 3 times per week. Exercise I: Wall Slides (Quadriceps)    1. Lean your back against a smooth wall or door while you walk your feet out 18-24 inches (46-61 cm) from it. 2. Place your feet hip-width apart. 3. Slowly slide down the wall or door until your knees Repeat 2 times. Complete this exercise every other day. 4. Exercise K: Straight Leg Raises - Hip Abductors  1. Lie on your side with your left / right leg in the top position.  Lie so your head, shoulder, knee, and hip line up. You may bend your bottom knee to help you keep your balance. 2. Roll your hips slightly forward so your hips are stacked directly over each other and your left / right knee is facing forward. 3. Leading with your heel, lift your top leg 4-6 inches (10-15 cm). You should feel the muscles in your outer hip lifting. ? Do not let your foot drift forward. ? Do not let your knee roll toward the ceiling. 4. Hold this position for  3 seconds. 5. Slowly return your leg to the starting position. 6. Let your muscles relax completely after each repetition. 10 total reps. Repeat 2 times. Complete this exercise 3 times per week. Exercise J: Straight Leg Raises - Hip Extensors  1. Lie on your abdomen on a firm surface. You can put a pillow under your hips if that is more comfortable. 2. Tense the muscles in your buttocks and lift your left / right leg about 4-6 inches (10-15 cm). Keep your knee straight as you lift your leg. 3. Hold this position for 3 seconds. 4. Slowly lower your leg to the starting position. 5. Let your leg relax completely after each repetition. Repeat 2 times. Complete this exercise 3 times per week. Document Released: 08/19/2005 Document Revised: 06/29/2016 Document Reviewed: 08/11/2015 Elsevier Interactive Patient Education  2017 [employer].

## 2018-10-21 NOTE — Progress Notes (Signed)
Pre visit review using our clinic review tool, if applicable. No additional management support is needed unless otherwise documented below in the visit note. 

## 2018-10-21 NOTE — Progress Notes (Signed)
Chief Complaint  Patient presents with  . Follow-up    6 months    Subjective Joshua [name] is a 83 y.o. male who presents for hypertension follow up. He does monitor home blood pressures. Blood pressures ranging from 120's/70's on average. He is compliant with medication- lisinopril 10 mg/d. Patient has these side effects of medication: none He is adhering to a healthy diet overall. Current exercise: nothing  Hyperlipidemia Patient presents for dyslipidemia follow up. Currently being treated with atorvastatin 20 mg/d and compliance with treatment thus far has been good. He denies myalgias. He is adhering to a healthy. Exercise: None The patient is not known to have coexisting coronary artery disease.  GERD S/s's controlled on PPI. No ae's. Compliant. Monitors diet.    Past Medical History:  Diagnosis Date  . Arthritis   . Cataract   . Esophageal stricture   . GERD (gastroesophageal reflux disease)   . Heart murmur   . Hyperlipidemia   . Hypertension   . Liver disease    hepatitis    Review of Systems Cardiovascular: no chest pain Respiratory:  no shortness of breath  Exam BP 120/78 (BP Location: Left Arm, Patient Position: Sitting, Cuff Size: Normal)   Pulse 94   Temp 98.4 F (36.9 C) (Oral)   Ht 6\' 1"  (1.854 m)   Wt 192 lb 6 oz (87.3 kg)   SpO2 96%   BMI 25.38 kg/m  General:  well developed, well nourished, in no apparent distress Heart: RRR, +SEM, no LE edema Lungs: clear to auscultation, no accessory muscle use Psych: well oriented with normal range of affect and appropriate judgment/insight  Essential hypertension  Hypercholesterolemia  Gastroesophageal reflux disease, esophagitis presence not specified  Cont care. Counseled on diet and exercise. Go back to walking.  F/u in 6 mo or prn, AWV also. The patient voiced understanding and agreement to the plan.  [place], DO 10/21/18  8:39 AM

## 2018-12-14 DIAGNOSIS — H524 Presbyopia: Secondary | ICD-10-CM | POA: Diagnosis not present

## 2018-12-21 ENCOUNTER — Ambulatory Visit: Payer: Self-pay | Admitting: *Deleted

## 2018-12-21 NOTE — Telephone Encounter (Signed)
Pt called with having his blood pressure 180/106 and 183/102. He is denying any symptoms with these, no headache, blurred vision, weakness, shortness of breath or chest pain. Has taken his b/p medication and not missed any. Has even taken an extra dose of b/p medication this afternoon. Per protocol, appointment scheduled. Will call 911 if he starts experiencing any symptoms mentioned above with elevated b/p reading. Pt voiced understanding. Routing to flow at [facility].  Reason for Disposition . Systolic BP  >= 665 OR Diastolic >= 993  Answer Assessment - Initial Assessment Questions 1. BLOOD PRESSURE: "What is the blood pressure?" "Did you take at least two measurements 5 minutes apart?"     180/106  And  183/102 2. ONSET: "When did you take your blood pressure?"     now 3. HOW: "How did you obtain the blood pressure?" (e.g., visiting nurse, automatic home BP monitor)     Automatic home BP monitor 4. HISTORY: "Do you have a history of high blood pressure?"     Yes  5. MEDICATIONS: "Are you taking any medications for blood pressure?" "Have you missed any doses recently?"     Is on bp meds and have not missed any doses 6. OTHER SYMPTOMS: "Do you have any symptoms?" (e.g., headache, chest pain, blurred vision, difficulty breathing, weakness)     no 7. PREGNANCY: "Is there any chance you are pregnant?" "When was your last menstrual period?"     N/A  Protocols used: HIGH BLOOD PRESSURE-A-AH

## 2018-12-22 ENCOUNTER — Encounter: Payer: Self-pay | Admitting: Family Medicine

## 2018-12-22 ENCOUNTER — Ambulatory Visit (INDEPENDENT_AMBULATORY_CARE_PROVIDER_SITE_OTHER): Payer: Medicare HMO | Admitting: Family Medicine

## 2018-12-22 VITALS — BP 152/90 | HR 102 | Temp 98.4°F | Ht 72.0 in | Wt 188.4 lb

## 2018-12-22 DIAGNOSIS — I10 Essential (primary) hypertension: Secondary | ICD-10-CM | POA: Diagnosis not present

## 2018-12-22 NOTE — Progress Notes (Signed)
Chief Complaint  Patient presents with  . Hypertension    for 2 days    Subjective Joshua [name] is a 83 y.o. male who presents for hypertension follow up. He does monitor home blood pressures. Blood pressures ranging from 150-180's/70-80's on average. He is compliant with medications. Patient has these side effects of medication: none He is adhering to a healthy diet overall. Current exercise: walking   Past Medical History:  Diagnosis Date  . Arthritis   . Cataract   . Esophageal stricture   . GERD (gastroesophageal reflux disease)   . Heart murmur   . Hyperlipidemia   . Hypertension   . Liver disease    hepatitis    Review of Systems Cardiovascular: no chest pain Respiratory:  no shortness of breath  Exam BP (!) 152/90 (BP Location: Left Arm, Patient Position: Sitting, Cuff Size: Normal)   Pulse (!) 102   Temp 98.4 F (36.9 C) (Oral)   Ht 6' (1.829 m)   Wt 188 lb 6 oz (85.4 kg)   SpO2 96%   BMI 25.55 kg/m  General:  well developed, well nourished, in no apparent distress Heart: RRR, no bruits, no LE edema Lungs: clear to auscultation, no accessory muscle use Psych: well oriented with normal range of affect and appropriate judgment/insight  Essential hypertension - Plan: lisinopril (PRINIVIL,ZESTRIL) 10 MG tablet  Increasing dose of lisinopril to 20 mg daily from 10 mg/d. Counseled on diet and exercise. Monitor at home.  F/u in 1 week. The patient voiced understanding and agreement to the plan.  [place], DO 12/22/18  8:35 AM

## 2018-12-22 NOTE — Patient Instructions (Addendum)
Keep the diet clean and stay active.  Take 2 tabs of the lisinopril until you run out and we can call in a new dose.   Keep checking your blood pressures at home.  Cancel the appointment if things are going well.   Let us know if you need anything.

## 2018-12-23 ENCOUNTER — Encounter: Payer: Self-pay | Admitting: Podiatry

## 2018-12-23 ENCOUNTER — Ambulatory Visit: Payer: Medicare HMO | Admitting: Podiatry

## 2018-12-23 DIAGNOSIS — M79676 Pain in unspecified toe(s): Secondary | ICD-10-CM

## 2018-12-23 DIAGNOSIS — B351 Tinea unguium: Secondary | ICD-10-CM | POA: Diagnosis not present

## 2018-12-23 NOTE — Progress Notes (Signed)
Complaint:  Visit Type: Patient returns to my office for continued preventative foot care services. Complaint: Patient states" my nails have grown long and thick and become painful to walk and wear shoes" Patient says his calluses are not painful. The patient presents for preventative foot care services. No changes to ROS  Podiatric Exam: Vascular: dorsalis pedis and posterior tibial pulses are palpable bilateral. Capillary return is immediate. Temperature gradient is WNL. Skin turgor WNL  Sensorium: Normal Semmes Weinstein monofilament test. Normal tactile sensation bilaterally. Nail Exam: Pt has thick disfigured discolored nails with subungual debris noted bilateral entire nail hallux through fifth toenails Ulcer Exam: There is no evidence of ulcer or pre-ulcerative changes or infection. Orthopedic Exam: Muscle tone and strength are WNL. No limitations in general ROM. No crepitus or effusions noted. Foot type and digits show no abnormalities. Bony prominences are unremarkable. Skin: No Porokeratosis. No infection or ulcers.Callus sub 1,5 right foot asymptomatic. Asymptomatic heel callus right.  Diagnosis:  Onychomycosis, , Pain in right toe, pain in left toes  Treatment & Plan Procedures and Treatment: Consent by patient was obtained for treatment procedures.   Debridement of mycotic and hypertrophic toenails, 1 through 5 bilateral and clearing of subungual debris. No ulceration, no infection noted. Iatrogenic lesion cauterized. Return Visit-Office Procedure: Patient instructed to return to the office for a follow up visit 3 months for continued evaluation and treatment.    Gardiner Barefoot DPM

## 2018-12-29 ENCOUNTER — Other Ambulatory Visit: Payer: Self-pay

## 2018-12-29 ENCOUNTER — Encounter: Payer: Self-pay | Admitting: Family Medicine

## 2018-12-29 ENCOUNTER — Ambulatory Visit (INDEPENDENT_AMBULATORY_CARE_PROVIDER_SITE_OTHER): Payer: Medicare HMO | Admitting: Family Medicine

## 2018-12-29 VITALS — BP 142/98 | HR 87 | Temp 98.4°F | Ht 72.0 in | Wt 189.0 lb

## 2018-12-29 DIAGNOSIS — I10 Essential (primary) hypertension: Secondary | ICD-10-CM

## 2018-12-29 MED ORDER — AMLODIPINE BESYLATE 5 MG PO TABS
5.0000 mg | ORAL_TABLET | Freq: Every day | ORAL | 3 refills | Status: DC
Start: 2018-12-29 — End: 2019-04-19

## 2018-12-29 NOTE — Progress Notes (Signed)
Chief Complaint  Patient presents with  . Hypertension    Subjective Joshua [name] is a 83 y.o. male who presents for hypertension follow up. He does monitor home blood pressures. Blood pressures ranging from 140-160's/70-90's on average. He is compliant with medications. Patient has these side effects of medication: none He is adhering to a healthy diet overall. Current exercise: walking   Past Medical History:  Diagnosis Date  . Arthritis   . Cataract   . Esophageal stricture   . GERD (gastroesophageal reflux disease)   . Heart murmur   . Hyperlipidemia   . Hypertension   . Liver disease    hepatitis    Review of Systems Cardiovascular: no chest pain Respiratory:  no shortness of breath  Exam BP (!) 142/98 (BP Location: Left Arm, Patient Position: Sitting, Cuff Size: Normal)   Pulse 87   Temp 98.4 F (36.9 C) (Oral)   Ht 6' (1.829 m)   Wt 189 lb (85.7 kg)   SpO2 96%   BMI 25.63 kg/m  General:  well developed, well nourished, in no apparent distress Heart: RRR, no bruits, no LE edema Lungs: clear to auscultation, no accessory muscle use Psych: well oriented with normal range of affect and appropriate judgment/insight  Essential hypertension - Plan: amLODipine (NORVASC) 5 MG tablet, Basic metabolic panel  Orders as above. Counseled on diet and exercise. F/u in 2 weeks to reck. The patient voiced understanding and agreement to the plan.  [place], DO 12/29/18  3:46 PM

## 2018-12-29 NOTE — Patient Instructions (Addendum)
Keep the diet clean and stay active.  Keep checking your blood pressures at home.  Give [place] 2-3 business days to get the results of your labs back.   Cancel appointment if you are doing better.  Let us know if you need anything.

## 2018-12-30 LAB — BASIC METABOLIC PANEL
BUN: 12 mg/dL (ref 6–23)
CO2: 27 mEq/L (ref 19–32)
CREATININE: 0.9 mg/dL (ref 0.40–1.50)
Calcium: 9.3 mg/dL (ref 8.4–10.5)
Chloride: 100 mEq/L (ref 96–112)
GFR: 81.13 mL/min (ref >60.00)
Glucose, Bld: 71 mg/dL (ref 70–99)
Potassium: 4.1 mEq/L (ref 3.5–5.1)
Sodium: 141 mEq/L (ref 135–145)

## 2019-01-12 ENCOUNTER — Ambulatory Visit: Payer: Medicare HMO | Admitting: Family Medicine

## 2019-02-23 ENCOUNTER — Telehealth: Payer: Self-pay | Admitting: Family Medicine

## 2019-02-23 NOTE — Telephone Encounter (Signed)
Copied from [place] [id number]. Topic: Quick Communication - See Telephone Encounter >> Feb 23, 2019 12:31 PM Ivar Drape wrote: CRM for notification. See Telephone encounter for: 02/23/19. Patient stated that he was instructed to take two tablets of his PRINIVIL,ZESTRIL) 10 MG tablet medication to see how it worked.  He said it works very well and he would like for the provider to write a prescription for 20 MG tablets and send it to his preferred [facility] mail order pharmacy.

## 2019-02-24 MED ORDER — LISINOPRIL 20 MG PO TABS: 20.0000 mg | ORAL_TABLET | Freq: Every day | ORAL | 3 refills | Status: DC

## 2019-02-24 NOTE — Telephone Encounter (Signed)
Done

## 2019-03-06 ENCOUNTER — Telehealth: Payer: Self-pay | Admitting: Family Medicine

## 2019-03-06 MED ORDER — LISINOPRIL 20 MG PO TABS: 20.0000 mg | ORAL_TABLET | Freq: Two times a day (BID) | ORAL | 0 refills | Status: DC

## 2019-03-06 NOTE — Telephone Encounter (Signed)
Copied from [place] [phone]. Topic: General - Other >> Mar 06, 2019 12:56 PM [name] A wrote: Reason for CRM: pt called in and stated that Dr Nani Ravens had told him that he needs to take 2 tabs a day of the lisinopril (ZESTRIL) 20 MG tablet [[id number]].  It was sent in on 5/8 for 1 tab a day.  He would like to know which one is correct.    Best number [phone]

## 2019-03-06 NOTE — Telephone Encounter (Signed)
Please advise 

## 2019-03-06 NOTE — Telephone Encounter (Signed)
Spoke to the patient and he has been taking 2 -- 10 mg twice daily= 40 mg daily BP last 5 days on 40 mg---5/13--117/71, 5/14--11/70, 5/15--125/78, 5/16--104/61, 5/17--110/72 and today 98/70.

## 2019-03-06 NOTE — Telephone Encounter (Signed)
There was a time when his BP was getting higher and we had him take 2 tabs of the 10 mg dosage. The 20 mg tab dosage was called in so he would only need to take 1 tab daily. That is what I want him on unless his blood pressure is getting higher. Ty.

## 2019-03-06 NOTE — Telephone Encounter (Signed)
OK to change to 20 mg bid then. Reorder to reflect the new sig and quantity. Ty.

## 2019-03-06 NOTE — Telephone Encounter (Signed)
Medication updated and sent to pharmacy Patient contacted

## 2019-03-14 DIAGNOSIS — D1801 Hemangioma of skin and subcutaneous tissue: Secondary | ICD-10-CM | POA: Diagnosis not present

## 2019-03-14 DIAGNOSIS — Z85828 Personal history of other malignant neoplasm of skin: Secondary | ICD-10-CM | POA: Diagnosis not present

## 2019-03-14 DIAGNOSIS — L821 Other seborrheic keratosis: Secondary | ICD-10-CM | POA: Diagnosis not present

## 2019-03-14 DIAGNOSIS — Z08 Encounter for follow-up examination after completed treatment for malignant neoplasm: Secondary | ICD-10-CM | POA: Diagnosis not present

## 2019-03-16 DIAGNOSIS — H35342 Macular cyst, hole, or pseudohole, left eye: Secondary | ICD-10-CM | POA: Diagnosis not present

## 2019-03-29 ENCOUNTER — Ambulatory Visit: Payer: Medicare HMO | Admitting: Podiatry

## 2019-04-14 DIAGNOSIS — H35343 Macular cyst, hole, or pseudohole, bilateral: Secondary | ICD-10-CM | POA: Diagnosis not present

## 2019-04-14 DIAGNOSIS — H35371 Puckering of macula, right eye: Secondary | ICD-10-CM | POA: Diagnosis not present

## 2019-04-14 DIAGNOSIS — H2511 Age-related nuclear cataract, right eye: Secondary | ICD-10-CM | POA: Diagnosis not present

## 2019-04-14 DIAGNOSIS — H43391 Other vitreous opacities, right eye: Secondary | ICD-10-CM | POA: Diagnosis not present

## 2019-04-18 ENCOUNTER — Other Ambulatory Visit: Payer: Self-pay | Admitting: Family Medicine

## 2019-04-18 DIAGNOSIS — I10 Essential (primary) hypertension: Secondary | ICD-10-CM

## 2019-04-24 ENCOUNTER — Ambulatory Visit: Payer: Medicare HMO | Admitting: *Deleted

## 2019-04-24 ENCOUNTER — Ambulatory Visit: Payer: Medicare HMO | Admitting: Family Medicine

## 2019-04-25 ENCOUNTER — Ambulatory Visit: Payer: Medicare HMO | Admitting: Podiatry

## 2019-04-25 ENCOUNTER — Encounter: Payer: Self-pay | Admitting: Podiatry

## 2019-04-25 ENCOUNTER — Other Ambulatory Visit: Payer: Self-pay

## 2019-04-25 DIAGNOSIS — B351 Tinea unguium: Secondary | ICD-10-CM

## 2019-04-25 DIAGNOSIS — M79675 Pain in left toe(s): Secondary | ICD-10-CM | POA: Diagnosis not present

## 2019-04-25 DIAGNOSIS — Q828 Other specified congenital malformations of skin: Secondary | ICD-10-CM | POA: Diagnosis not present

## 2019-04-25 DIAGNOSIS — M79674 Pain in right toe(s): Secondary | ICD-10-CM | POA: Diagnosis not present

## 2019-04-25 NOTE — Progress Notes (Signed)
Complaint:  Visit Type: Patient returns to my office for continued preventative foot care services. Complaint: Patient states" my nails have grown long and thick and become painful to walk and wear shoes" Patient says his calluses are not painful. The patient presents for preventative foot care services. No changes to ROS  Podiatric Exam: Vascular: dorsalis pedis and posterior tibial pulses are palpable bilateral. Capillary return is immediate. Temperature gradient is WNL. Skin turgor WNL  Sensorium: Normal Semmes Weinstein monofilament test. Normal tactile sensation bilaterally. Nail Exam: Pt has thick disfigured discolored nails with subungual debris noted bilateral entire nail hallux through fifth toenails Ulcer Exam: There is no evidence of ulcer or pre-ulcerative changes or infection. Orthopedic Exam: Muscle tone and strength are WNL. No limitations in general ROM. No crepitus or effusions noted. Foot type and digits show no abnormalities. Bony prominences are unremarkable. Skin: No Porokeratosis. No infection or ulcers.Callus sub 1,5 right foot asymptomatic. Asymptomatic heel callus right. Painful porokeratosis sub 4th met right foot.  Diagnosis:  Onychomycosis, , Pain in right toe, pain in left toes,  Porokeratosis sub 4th right  Treatment & Plan Procedures and Treatment: Consent by patient was obtained for treatment procedures.   Debridement of mycotic and hypertrophic toenails, 1 through 5 bilateral and clearing of subungual debris. No ulceration, no infection noted. Debridement of porokeratosis  Right foot. Return Visit-Office Procedure: Patient instructed to return to the office for a follow up visit 3 months for continued evaluation and treatment.    Gardiner Barefoot DPM

## 2019-05-30 ENCOUNTER — Other Ambulatory Visit: Payer: Self-pay | Admitting: Family Medicine

## 2019-05-30 DIAGNOSIS — K219 Gastro-esophageal reflux disease without esophagitis: Secondary | ICD-10-CM

## 2019-05-30 MED ORDER — LISINOPRIL 20 MG PO TABS: 20.0000 mg | ORAL_TABLET | Freq: Every day | ORAL | 0 refills | Status: DC

## 2019-05-30 MED ORDER — OMEPRAZOLE 40 MG PO CPDR: 40.0000 mg | DELAYED_RELEASE_CAPSULE | Freq: Every day | ORAL | 2 refills | Status: DC

## 2019-06-05 ENCOUNTER — Other Ambulatory Visit: Payer: Self-pay | Admitting: Family Medicine

## 2019-06-05 DIAGNOSIS — I10 Essential (primary) hypertension: Secondary | ICD-10-CM

## 2019-06-05 MED ORDER — AMLODIPINE BESYLATE 5 MG PO TABS: ORAL_TABLET | ORAL | 0 refills | Status: DC

## 2019-06-05 NOTE — Telephone Encounter (Signed)
Medication Refill - Medication: amLODipine (NORVASC) 5 MG tablet  Pharmacy request.   Preferred [facility] - [place], [place] [phone] (Phone) [phone] (Fax)

## 2019-06-07 ENCOUNTER — Other Ambulatory Visit: Payer: Self-pay | Admitting: *Deleted

## 2019-06-07 DIAGNOSIS — K219 Gastro-esophageal reflux disease without esophagitis: Secondary | ICD-10-CM

## 2019-06-07 MED ORDER — LISINOPRIL 20 MG PO TABS: 20.0000 mg | ORAL_TABLET | Freq: Every day | ORAL | 1 refills | Status: DC

## 2019-06-07 MED ORDER — OMEPRAZOLE 40 MG PO CPDR: 40.0000 mg | DELAYED_RELEASE_CAPSULE | Freq: Every day | ORAL | 1 refills | Status: DC

## 2019-08-01 ENCOUNTER — Ambulatory Visit: Payer: Medicare HMO | Admitting: Podiatry

## 2019-08-01 ENCOUNTER — Encounter: Payer: Self-pay | Admitting: Podiatry

## 2019-08-01 ENCOUNTER — Other Ambulatory Visit: Payer: Self-pay

## 2019-08-01 DIAGNOSIS — B351 Tinea unguium: Secondary | ICD-10-CM

## 2019-08-01 DIAGNOSIS — M79675 Pain in left toe(s): Secondary | ICD-10-CM | POA: Diagnosis not present

## 2019-08-01 DIAGNOSIS — M79674 Pain in right toe(s): Secondary | ICD-10-CM

## 2019-08-01 DIAGNOSIS — Q828 Other specified congenital malformations of skin: Secondary | ICD-10-CM | POA: Diagnosis not present

## 2019-08-01 NOTE — Progress Notes (Signed)
Complaint:  Visit Type: Patient returns to my office for continued preventative foot care services. Complaint: Patient states" my nails have grown long and thick and become painful to walk and wear shoes" Patient says his calluses are not painful. The patient presents for preventative foot care services. No changes to ROS  Podiatric Exam: Vascular: dorsalis pedis and posterior tibial pulses are palpable bilateral. Capillary return is immediate. Temperature gradient is WNL. Skin turgor WNL  Sensorium: Normal Semmes Weinstein monofilament test. Normal tactile sensation bilaterally. Nail Exam: Pt has thick disfigured discolored nails with subungual debris noted bilateral entire nail hallux through fifth toenails Ulcer Exam: There is no evidence of ulcer or pre-ulcerative changes or infection. Orthopedic Exam: Muscle tone and strength are WNL. No limitations in general ROM. No crepitus or effusions noted. Foot type and digits show no abnormalities. Bony prominences are unremarkable. Skin: No Porokeratosis. No infection or ulcers.Callus sub 1,5 right foot asymptomatic. Asymptomatic heel callus right. Painful porokeratosis sub 4th met right foot.  Diagnosis:  Onychomycosis, , Pain in right toe, pain in left toes,  Porokeratosis sub 4th right  Treatment & Plan Procedures and Treatment: Consent by patient was obtained for treatment procedures.   Debridement of mycotic and hypertrophic toenails, 1 through 5 bilateral and clearing of subungual debris. No ulceration, no infection noted. Debridement of porokeratosis  Right foot. Return Visit-Office Procedure: Patient instructed to return to the office for a follow up visit 3 months for continued evaluation and treatment.    Abdi Husak DPM 

## 2019-08-21 ENCOUNTER — Other Ambulatory Visit: Payer: Self-pay | Admitting: Family Medicine

## 2019-08-21 DIAGNOSIS — I10 Essential (primary) hypertension: Secondary | ICD-10-CM

## 2019-10-31 ENCOUNTER — Encounter: Payer: Self-pay | Admitting: Podiatry

## 2019-10-31 ENCOUNTER — Other Ambulatory Visit: Payer: Self-pay

## 2019-10-31 ENCOUNTER — Ambulatory Visit: Payer: Medicare HMO | Admitting: Podiatry

## 2019-10-31 DIAGNOSIS — Q828 Other specified congenital malformations of skin: Secondary | ICD-10-CM

## 2019-10-31 DIAGNOSIS — B351 Tinea unguium: Secondary | ICD-10-CM

## 2019-10-31 DIAGNOSIS — S90414A Abrasion, right lesser toe(s), initial encounter: Secondary | ICD-10-CM | POA: Diagnosis not present

## 2019-10-31 DIAGNOSIS — M79675 Pain in left toe(s): Secondary | ICD-10-CM | POA: Diagnosis not present

## 2019-10-31 DIAGNOSIS — M79674 Pain in right toe(s): Secondary | ICD-10-CM

## 2019-10-31 DIAGNOSIS — L089 Local infection of the skin and subcutaneous tissue, unspecified: Secondary | ICD-10-CM

## 2019-10-31 MED ORDER — CEPHALEXIN 500 MG PO CAPS: 500.0000 mg | ORAL_CAPSULE | Freq: Two times a day (BID) | ORAL | 0 refills | Status: DC

## 2019-10-31 NOTE — Progress Notes (Signed)
Complaint:  Visit Type: Patient returns to my office for continued preventative foot care services. Complaint: Patient states" my nails have grown long and thick and become painful to walk and wear shoes" Patient says his calluses on his right forefoot is painful.. The patient presents for preventative foot care services. No changes to ROS  Podiatric Exam: Vascular: dorsalis pedis and posterior tibial pulses are palpable bilateral. Capillary return is immediate. Temperature gradient is WNL. Skin turgor WNL  Sensorium: Normal Semmes Weinstein monofilament test. Normal tactile sensation bilaterally. Nail Exam: Pt has thick disfigured discolored nails with subungual debris noted bilateral entire nail hallux through fifth toenails Ulcer Exam: There is no evidence of ulcer or pre-ulcerative changes or infection. Orthopedic Exam: Muscle tone and strength are WNL. No limitations in general ROM. No crepitus or effusions noted. Foot type and digits show no abnormalities. Bony prominences are unremarkable. Skin: No Porokeratosis. No infection or ulcers.Callus sub 1,5 right foot asymptomatic. Asymptomatic heel callus right. Painful porokeratosis sub 4th met right foot.  Diagnosis:  Onychomycosis, , Pain in right toe, pain in left toes,  Porokeratosis sub 4th right  Treatment & Plan Procedures and Treatment: Consent by patient was obtained for treatment procedures.   Debridement of mycotic and hypertrophic toenails, 1 through 5 bilateral and clearing of subungual debris. No ulceration, no infection noted. Debridement of porokeratosis  right foot.  Upon treatment of his nails and callus  I noted he had a healing skin lesion on his fifth toe right foot.  He says he had a painful corn on his fifth toe right foot and self treated his fifth toe.  He has open lesion which is healing.  The skin lesion has swelling and redness at site of skin lesion.  The toe was bandaged with neosporin/DSD.  He was told to soak his  right foot and bandage his fifth toe.  He has been prescribed cephalexin.  RTC 10 weeks.  Call if skin lesion worsens. Return Visit-Office Procedure: Patient instructed to return to the office for a follow up visit 10 weeks  for continued evaluation and treatment.    Gardiner Barefoot DPM

## 2019-10-31 NOTE — Patient Instructions (Signed)
After Care Instructions  1) Soak foot in soapy sudsy water for 15 minutes after removing the surgical bandage.  2) Re-bandage surgical site after the soaks with neosporin, a sterile 2x2 gauze pad and tape.  3) Perform this soak twice a day following redressing area as above.  4) Return to office in 1 week for re-evaluation.  5) Take Advil for pain medication as needed. If given an antibiotic, finish completely.  6) 2nd week-peroxide washes are recommended instead of soaks. Also air drying is recommended.10

## 2019-12-04 ENCOUNTER — Other Ambulatory Visit: Payer: Self-pay | Admitting: Family Medicine

## 2019-12-04 DIAGNOSIS — I10 Essential (primary) hypertension: Secondary | ICD-10-CM

## 2020-01-03 DIAGNOSIS — R220 Localized swelling, mass and lump, head: Secondary | ICD-10-CM | POA: Diagnosis not present

## 2020-01-09 ENCOUNTER — Other Ambulatory Visit: Payer: Self-pay

## 2020-01-09 ENCOUNTER — Encounter: Payer: Self-pay | Admitting: Podiatry

## 2020-01-09 ENCOUNTER — Ambulatory Visit: Payer: Self-pay | Admitting: Podiatry

## 2020-01-09 VITALS — Temp 97.2°F

## 2020-01-09 DIAGNOSIS — M79674 Pain in right toe(s): Secondary | ICD-10-CM

## 2020-01-09 DIAGNOSIS — B351 Tinea unguium: Secondary | ICD-10-CM

## 2020-01-09 DIAGNOSIS — M79675 Pain in left toe(s): Secondary | ICD-10-CM | POA: Diagnosis not present

## 2020-01-09 DIAGNOSIS — Q828 Other specified congenital malformations of skin: Secondary | ICD-10-CM

## 2020-01-09 NOTE — Progress Notes (Signed)
This patient returns to the office for evaluation and treatment of long thick painful nails .  This patient is unable to trim his own nails since the patient cannot reach the feet.  Patient says the nails are painful walking and wearing his shoes.  He has painful callus on his right forefoot. Which he is unable to self treat.   He returns for preventive foot care services.  General Appearance  Alert, conversant and in no acute stress.  Vascular  Dorsalis pedis are palpable  bilaterally. Posterior tibial pulses are absent  B/L. Capillary return is within normal limits  bilaterally. Temperature is within normal limits  bilaterally.  Neurologic  Senn-Weinstein monofilament wire test within normal limits  bilaterally. Muscle power within normal limits bilaterally.  Nails Thick disfigured discolored nails with subungual debris  from hallux to fifth toes bilaterally. No evidence of bacterial infection or drainage bilaterally.  Orthopedic  No limitations of motion  feet .  No crepitus or effusions noted.  No bony pathology or digital deformities noted.  Skin  normotropic skin with no porokeratosis noted bilaterally.  No signs of infections or ulcers noted.   Callus sub 1,4 right foot.  Onychomycosis  Pain in toes right foot  Pain in toes left foot  Porokeratosis sub 1,4 right foot.  Debridement  of nails  1-5  B/L with a nail nipper.  Nails were then filed using a dremel tool with no incidents.  Debridement of porokeratosis right foot with # 15 blade.   RTC 10 weeks   Gardiner Barefoot DPM

## 2020-01-18 ENCOUNTER — Other Ambulatory Visit: Payer: Self-pay | Admitting: Family Medicine

## 2020-01-18 DIAGNOSIS — E7800 Pure hypercholesterolemia, unspecified: Secondary | ICD-10-CM

## 2020-01-18 MED ORDER — ATORVASTATIN CALCIUM 20 MG PO TABS: 20.0000 mg | ORAL_TABLET | Freq: Every day | ORAL | 0 refills | Status: DC

## 2020-02-17 ENCOUNTER — Other Ambulatory Visit: Payer: Self-pay | Admitting: Family Medicine

## 2020-02-17 DIAGNOSIS — I10 Essential (primary) hypertension: Secondary | ICD-10-CM

## 2020-03-12 ENCOUNTER — Ambulatory Visit: Payer: Medicare HMO | Admitting: *Deleted

## 2020-03-13 ENCOUNTER — Other Ambulatory Visit: Payer: Self-pay | Admitting: Family Medicine

## 2020-03-13 DIAGNOSIS — I10 Essential (primary) hypertension: Secondary | ICD-10-CM

## 2020-03-13 DIAGNOSIS — K219 Gastro-esophageal reflux disease without esophagitis: Secondary | ICD-10-CM

## 2020-03-15 ENCOUNTER — Encounter: Payer: Medicare HMO | Admitting: Family Medicine

## 2020-03-21 DIAGNOSIS — D692 Other nonthrombocytopenic purpura: Secondary | ICD-10-CM | POA: Diagnosis not present

## 2020-03-21 DIAGNOSIS — D1801 Hemangioma of skin and subcutaneous tissue: Secondary | ICD-10-CM | POA: Diagnosis not present

## 2020-03-21 DIAGNOSIS — L720 Epidermal cyst: Secondary | ICD-10-CM | POA: Diagnosis not present

## 2020-03-21 DIAGNOSIS — L570 Actinic keratosis: Secondary | ICD-10-CM | POA: Diagnosis not present

## 2020-03-21 DIAGNOSIS — L821 Other seborrheic keratosis: Secondary | ICD-10-CM | POA: Diagnosis not present

## 2020-03-29 NOTE — Progress Notes (Signed)
I connected with Joshua [name] today by telephone and verified that I am speaking with the correct person using two identifiers. Location patient: home Location provider: work Persons participating in the virtual visit: patient, Marine scientist.    I discussed the limitations, risks, security and privacy concerns of performing an evaluation and management service by telephone and the availability of in person appointments. I also discussed with the patient that there may be a patient responsible charge related to this service. The patient expressed understanding and verbally consented to this telephonic visit.    Interactive audio and video telecommunications were attempted between this RN and patient, however failed, due to patient having technical difficulties OR patient did not have access to video capability.  We continued and completed visit with audio only.  Some vital signs may be absent or patient reported.    Subjective:   Joshua [name] is a 84 y.o. male who presents for Medicare Annual/Subsequent preventive examination.  Review of Systems:  Home Safety/Smoke Alarms: Feels safe in home. Smoke alarms in place.  Lives alone in 1 story town home. Wife passed 10/2018. Sons calls him daily and visits every other week.  Male:   CCS- No longer doing routine screening due to age.    PSA- No results found for: PSA      Objective:    Vitals: Unable to assess. This visit is enabled though telemedicine due to Covid 19.   Advanced Directives 04/01/2020 12/28/2017 07/11/2016  Does Patient Have a Medical Advance Directive? Yes No;Yes No  Type of Paramedic of [place];Living will [place];Living will -  Does patient want to make changes to medical advance directive? No - Patient declined No - Patient declined -  Copy of [place] in Chart? Yes - validated most recent copy scanned in chart (See row information) No - copy requested -  Would  patient like information on creating a medical advance directive? - - No - patient declined information    Tobacco Social History   Tobacco Use  Smoking Status Former Smoker  . Quit date: 35  . Years since quitting: 13.4  Smokeless Tobacco Never Used     Counseling given: Not Answered   Clinical Intake: Pain : No/denies pain     Past Medical History:  Diagnosis Date  . Arthritis   . Cataract   . Esophageal stricture   . GERD (gastroesophageal reflux disease)   . Heart murmur   . Hyperlipidemia   . Hypertension   . Liver disease    hepatitis   Past Surgical History:  Procedure Laterality Date  . cataract  2013  . HEMORROIDECTOMY  2001  . HERNIA REPAIR  2005  . TONSILLECTOMY  1948   Family History  Problem Relation Age of Onset  . Heart failure Mother 32  . Heart failure Father 28  . Cancer Maternal Grandmother   . Diabetes Child   . Heart Problems Child 49       cardiac arrest   Social History   Socioeconomic History  . Marital status: Widowed    Spouse name: Not on file  . Number of children: Not on file  . Years of education: Not on file  . Highest education level: Not on file  Occupational History  . Not on file  Tobacco Use  . Smoking status: Former Smoker    Quit date: 1965    Years since quitting: 56.4  . Smokeless tobacco: Never Used  Substance and Sexual Activity  . Alcohol use: Yes    Comment: daily a few cocktails  . Drug use: No  . Sexual activity: Never  Other Topics Concern  . Not on file  Social History Narrative   Epworth Sleepiness Scale = 3 (as of 07/15/2016)   Social Determinants of Health   Financial Resource Strain: Low Risk   . Difficulty of Paying Living Expenses: Not hard at all  Food Insecurity: No Food Insecurity  . Worried About Charity fundraiser in the Last Year: Never true  . Ran Out of Food in the Last Year: Never true  Transportation Needs: No Transportation Needs  . Lack of Transportation (Medical): No    . Lack of Transportation (Non-Medical): No  Physical Activity:   . Days of Exercise per Week:   . Minutes of Exercise per Session:   Stress:   . Feeling of Stress :   Social Connections:   . Frequency of Communication with Friends and Family:   . Frequency of Social Gatherings with Friends and Family:   . Attends Religious Services:   . Active Member of Clubs or Organizations:   . Attends Archivist Meetings:   Marland Kitchen Marital Status:     Outpatient Encounter Medications as of 04/01/2020  Medication Sig  . amLODipine (NORVASC) 5 MG tablet TAKE 1 TABLET EVERY DAY  . atorvastatin (LIPITOR) 20 MG tablet Take 1 tablet (20 mg total) by mouth daily at 6 PM.  . FLUAD QUADRIVALENT 0.5 ML injection   . Glucosamine-Chondroit-Vit C-Mn (GLUCOSAMINE 1500 COMPLEX PO) Take by mouth daily.  Marland Kitchen ketoconazole (NIZORAL) 2 % shampoo APP EXT 3 TIMES A WK  . lisinopril (ZESTRIL) 20 MG tablet Take 1 tablet (20 mg total) by mouth daily.  . Multiple Vitamin (MULTIVITAMIN) tablet Take 1 tablet by mouth daily.  Marland Kitchen omeprazole (PRILOSEC) 40 MG capsule TAKE 1 CAPSULE EVERY DAY   No facility-administered encounter medications on file as of 04/01/2020.    Activities of Daily Living In your present state of health, do you have any difficulty performing the following activities: 04/01/2020  Hearing? N  Vision? N  Difficulty concentrating or making decisions? N  Comment enjoys reading and stocks.  Walking or climbing stairs? N  Dressing or bathing? N  Doing errands, shopping? N  Preparing Food and eating ? N  Using the Toilet? N  In the past six months, have you accidently leaked urine? N  Do you have problems with loss of bowel control? N  Managing your Medications? N  Managing your Finances? N  Housekeeping or managing your Housekeeping? N  Comment Chartered certified accountant every 2 wks.  Some recent data might be hidden    Patient Care Team: Shelda Pal, DO as PCP - General (Family Medicine)    Assessment:   This is a routine wellness examination for [name]. Physical assessment deferred to PCP.  Exercise Activities and Dietary recommendations Current Exercise Habits: The patient does not participate in regular exercise at present, Exercise limited by: None identified Diet (meal preparation, eat out, water intake, caffeinated beverages, dairy products, fruits and vegetables): in general, a "healthy" diet  , well balanced  Goals    . Maintain current health       Fall Risk Fall Risk  04/01/2020 12/28/2017  Falls in the past year? 1 No  Number falls in past yr: 1 -  Injury with Fall? 0 -  Risk for fall due to : Impaired balance/gait;History of fall(s) -  Follow up Education provided;Falls prevention discussed -   Depression Screen PHQ 2/9 Scores 04/01/2020 12/28/2017  PHQ - 2 Score 1 0    Cognitive Function Ad8 score reviewed for issues:  Issues making decisions:no  Less interest in hobbies / activities:no  Repeats questions, stories (family complaining):no  Trouble using ordinary gadgets (microwave, computer, phone):no  Forgets the month or year: no  Mismanaging finances: no  Remembering appts:no  Daily problems with thinking and/or memory:no Ad8 score is=0     MMSE - Mini Mental State Exam 12/28/2017  Orientation to time 5  Orientation to Place 5  Registration 3  Attention/ Calculation 5  Recall 3  Language- name 2 objects 2  Language- repeat 1  Language- follow 3 step command 3  Language- read & follow direction 1  Write a sentence 1  Copy design 1  Total score 30        Immunization History  Administered Date(s) Administered  . Influenza, High Dose Seasonal PF 07/19/2017  . Influenza-Unspecified 07/20/2015  . Pneumococcal Polysaccharide-23 04/22/2018  . Tdap 04/22/2018    Screening Tests Health Maintenance  Topic Date Due  . COVID-19 Vaccine (1) Never done  . INFLUENZA VACCINE  05/19/2020  . TETANUS/TDAP  04/22/2028  . PNA vac Low  Risk Adult  Completed        Plan:   See you next year!  Continue to eat heart healthy diet (full of fruits, vegetables, whole grains, lean protein, water--limit salt, fat, and sugar intake) and increase physical activity as tolerated.  Continue doing brain stimulating activities (puzzles, reading, adult coloring books, staying active) to keep memory sharp.     I have personally reviewed and noted the following in the patient's chart:   . Medical and social history . Use of alcohol, tobacco or illicit drugs  . Current medications and supplements . Functional ability and status . Nutritional status . Physical activity . Advanced directives . List of other physicians . Hospitalizations, surgeries, and ER visits in previous 12 months . Vitals . Screenings to include cognitive, depression, and falls . Referrals and appointments  In addition, I have reviewed and discussed with patient certain preventive protocols, quality metrics, and best practice recommendations. A written personalized care plan for preventive services as well as general preventive health recommendations were provided to patient.   Due to this being a telephonic visit, the after visit summary with patients personalized plan was offered to patient via mail or my-chart. Patient preferred to pick up at office at next visit.  Shela Nevin, [place]  04/01/2020

## 2020-04-01 ENCOUNTER — Encounter: Payer: Self-pay | Admitting: *Deleted

## 2020-04-01 ENCOUNTER — Other Ambulatory Visit: Payer: Self-pay

## 2020-04-01 ENCOUNTER — Ambulatory Visit (INDEPENDENT_AMBULATORY_CARE_PROVIDER_SITE_OTHER): Payer: Medicare HMO | Admitting: *Deleted

## 2020-04-01 DIAGNOSIS — Z0000 Encounter for general adult medical examination without abnormal findings: Secondary | ICD-10-CM

## 2020-04-01 NOTE — Patient Instructions (Signed)
See you next year!  Continue to eat heart healthy diet (full of fruits, vegetables, whole grains, lean protein, water--limit salt, fat, and sugar intake) and increase physical activity as tolerated.  Continue doing brain stimulating activities (puzzles, reading, adult coloring books, staying active) to keep memory sharp.    Joshua [name] , Thank you for taking time to come for your Medicare Wellness Visit. I appreciate your ongoing commitment to your health goals. Please review the following plan we discussed and let me know if I can assist you in the future.   These are the goals we discussed: Goals    . Maintain current health       This is a list of the screening recommended for you and due dates:  Health Maintenance  Topic Date Due  . COVID-19 Vaccine (1) Never done  . Flu Shot  05/19/2020  . Tetanus Vaccine  04/22/2028  . Pneumonia vaccines  Completed    Preventive Care 27 Years and Older, Male Preventive care refers to lifestyle choices and visits with your health care provider that can promote health and wellness. This includes:  A yearly physical exam. This is also called an annual well check.  Regular dental and eye exams.  Immunizations.  Screening for certain conditions.  Healthy lifestyle choices, such as diet and exercise. What can I expect for my preventive care visit? Physical exam Your health care provider will check:  Height and weight. These may be used to calculate body mass index (BMI), which is a measurement that tells if you are at a healthy weight.  Heart rate and blood pressure.  Your skin for abnormal spots. Counseling Your health care provider may ask you questions about:  Alcohol, tobacco, and drug use.  Emotional well-being.  Home and relationship well-being.  Sexual activity.  Eating habits.  History of falls.  Memory and ability to understand (cognition).  Work and work Statistician. What immunizations do I need?  Influenza  (flu) vaccine  This is recommended every year. Tetanus, diphtheria, and pertussis (Tdap) vaccine  You may need a Td booster every 10 years. Varicella (chickenpox) vaccine  You may need this vaccine if you have not already been vaccinated. Zoster (shingles) vaccine  You may need this after age 38. Pneumococcal conjugate (PCV13) vaccine  One dose is recommended after age 33. Pneumococcal polysaccharide (PPSV23) vaccine  One dose is recommended after age 41. Measles, mumps, and rubella (MMR) vaccine  You may need at least one dose of MMR if you were born in 1957 or later. You may also need a second dose. Meningococcal conjugate (MenACWY) vaccine  You may need this if you have certain conditions. Hepatitis A vaccine  You may need this if you have certain conditions or if you travel or work in places where you may be exposed to hepatitis A. Hepatitis B vaccine  You may need this if you have certain conditions or if you travel or work in places where you may be exposed to hepatitis B. Haemophilus influenzae type b (Hib) vaccine  You may need this if you have certain conditions. You may receive vaccines as individual doses or as more than one vaccine together in one shot (combination vaccines). Talk with your health care provider about the risks and benefits of combination vaccines. What tests do I need? Blood tests  Lipid and cholesterol levels. These may be checked every 5 years, or more frequently depending on your overall health.  Hepatitis C test.  Hepatitis B test. Screening  Lung cancer screening. You may have this screening every year starting at age 41 if you have a 30-pack-year history of smoking and currently smoke or have quit within the past 15 years.  Colorectal cancer screening. All adults should have this screening starting at age 110 and continuing until age 54. Your health care provider may recommend screening at age 65 if you are at increased risk. You will  have tests every 1-10 years, depending on your results and the type of screening test.  Prostate cancer screening. Recommendations will vary depending on your family history and other risks.  Diabetes screening. This is done by checking your blood sugar (glucose) after you have not eaten for a while (fasting). You may have this done every 1-3 years.  Abdominal aortic aneurysm (AAA) screening. You may need this if you are a current or former smoker.  Sexually transmitted disease (STD) testing. Follow these instructions at home: Eating and drinking  Eat a diet that includes fresh fruits and vegetables, whole grains, lean protein, and low-fat dairy products. Limit your intake of foods with high amounts of sugar, saturated fats, and salt.  Take vitamin and mineral supplements as recommended by your health care provider.  Do not drink alcohol if your health care provider tells you not to drink.  If you drink alcohol: ? Limit how much you have to 0-2 drinks a day. ? Be aware of how much alcohol is in your drink. In the [place] one drink equals one 12 oz bottle of beer (355 mL), one 5 oz glass of wine (148 mL), or one 1 oz glass of hard liquor (44 mL). Lifestyle  Take daily care of your teeth and gums.  Stay active. Exercise for at least 30 minutes on 5 or more days each week.  Do not use any products that contain nicotine or tobacco, such as cigarettes, e-cigarettes, and chewing tobacco. If you need help quitting, ask your health care provider.  If you are sexually active, practice safe sex. Use a condom or other form of protection to prevent STIs (sexually transmitted infections).  Talk with your health care provider about taking a low-dose aspirin or statin. What's next?  Visit your health care provider once a year for a well check visit.  Ask your health care provider how often you should have your eyes and teeth checked.  Stay up to date on all vaccines. This information is not  intended to replace advice given to you by your health care provider. Make sure you discuss any questions you have with your health care provider. Document Revised: 09/29/2018 Document Reviewed: 09/29/2018 Elsevier Patient Education  2020 [employer].

## 2020-04-03 ENCOUNTER — Ambulatory Visit (INDEPENDENT_AMBULATORY_CARE_PROVIDER_SITE_OTHER): Payer: Medicare HMO | Admitting: Family Medicine

## 2020-04-03 ENCOUNTER — Encounter: Payer: Self-pay | Admitting: Family Medicine

## 2020-04-03 ENCOUNTER — Other Ambulatory Visit: Payer: Self-pay

## 2020-04-03 VITALS — BP 124/70 | HR 92 | Temp 96.5°F | Ht 72.0 in | Wt 179.5 lb

## 2020-04-03 DIAGNOSIS — K219 Gastro-esophageal reflux disease without esophagitis: Secondary | ICD-10-CM

## 2020-04-03 DIAGNOSIS — I10 Essential (primary) hypertension: Secondary | ICD-10-CM | POA: Diagnosis not present

## 2020-04-03 DIAGNOSIS — R29898 Other symptoms and signs involving the musculoskeletal system: Secondary | ICD-10-CM

## 2020-04-03 DIAGNOSIS — E7800 Pure hypercholesterolemia, unspecified: Secondary | ICD-10-CM

## 2020-04-03 DIAGNOSIS — Z0000 Encounter for general adult medical examination without abnormal findings: Secondary | ICD-10-CM

## 2020-04-03 MED ORDER — ATORVASTATIN CALCIUM 20 MG PO TABS: 20.0000 mg | ORAL_TABLET | Freq: Every day | ORAL | 0 refills | Status: DC

## 2020-04-03 MED ORDER — LISINOPRIL 20 MG PO TABS: 20.0000 mg | ORAL_TABLET | Freq: Every day | ORAL | 2 refills | Status: DC

## 2020-04-03 MED ORDER — AMLODIPINE BESYLATE 5 MG PO TABS: 5.0000 mg | ORAL_TABLET | Freq: Every day | ORAL | 2 refills | Status: DC

## 2020-04-03 NOTE — Progress Notes (Signed)
Chief Complaint  Patient presents with  . Follow-up  . Annual Exam    Well Male Joshua [name] is here for a complete physical.   His last physical was >1 year ago.  Current diet: in general, a "pretty good" diet.   Current exercise: some walking Weight trend: loss 10 lbs intentionally over past 3 mo Daytime fatigue? No. Seat belt? Yes.    Health maintenance Tetanus- Yes Pneumonia vaccine- Yes  Past Medical History:  Diagnosis Date  . Arthritis   . Cataract   . Esophageal stricture   . GERD (gastroesophageal reflux disease)   . Heart murmur   . Hyperlipidemia   . Hypertension   . Liver disease    hepatitis     Past Surgical History:  Procedure Laterality Date  . cataract  2013  . HEMORROIDECTOMY  2001  . HERNIA REPAIR  2005  . TONSILLECTOMY  1948    Medications  Current Outpatient Medications on File Prior to Visit  Medication Sig Dispense Refill  . amLODipine (NORVASC) 5 MG tablet TAKE 1 TABLET EVERY DAY 90 tablet 0  . atorvastatin (LIPITOR) 20 MG tablet Take 1 tablet (20 mg total) by mouth daily at 6 PM. 90 tablet 0  . FLUAD QUADRIVALENT 0.5 ML injection     . Glucosamine-Chondroit-Vit C-Mn (GLUCOSAMINE 1500 COMPLEX PO) Take by mouth daily.    Marland Kitchen ketoconazole (NIZORAL) 2 % shampoo APP EXT 3 TIMES A WK  6  . lisinopril (ZESTRIL) 20 MG tablet Take 1 tablet (20 mg total) by mouth daily. 180 tablet 1  . Multiple Vitamin (MULTIVITAMIN) tablet Take 1 tablet by mouth daily.    Marland Kitchen omeprazole (PRILOSEC) 40 MG capsule TAKE 1 CAPSULE EVERY DAY 90 capsule 1   Allergies No Known Allergies  Family History Family History  Problem Relation Age of Onset  . Heart failure Mother 85  . Heart failure Father 50  . Cancer Maternal Grandmother   . Diabetes Child   . Heart Problems Child 49       cardiac arrest    Review of Systems: Constitutional:  no fevers Eye:  no recent significant change in vision Ears:  No changes in hearing Nose/Mouth/Throat:  no complaints of  nasal congestion, no sore throat Cardiovascular: no chest pain Respiratory:  No shortness of breath Gastrointestinal:  No change in bowel habits GU:  No frequency Integumentary:  no abnormal skin lesions reported Neurologic:  +weakness of hamstrings Endocrine:  denies unexplained weight changes  Exam BP 124/70 (BP Location: Right Arm, Patient Position: Sitting, Cuff Size: Normal)   Pulse 92   Temp (!) 96.5 F (35.8 C) (Temporal)   Ht 6' (1.829 m)   Wt 179 lb 8 oz (81.4 kg)   SpO2 96%   BMI 24.34 kg/m  General:  well developed, well nourished, in no apparent distress Skin:  no significant moles, warts, or growths Head:  no masses, lesions, or tenderness Eyes:  pupils equal and round, sclera anicteric without injection Ears:  canals without lesions, TMs shiny without retraction, no obvious effusion, no erythema Nose:  nares patent, septum midline, mucosa normal Throat/Pharynx:  lips and gingiva without lesion; tongue and uvula midline; non-inflamed pharynx; no exudates or postnasal drainage Lungs:  clear to auscultation, breath sounds equal bilaterally, no respiratory distress Cardio:  regular rate and rhythm, no LE edema or bruits Rectal: Deferred GI: BS+, S, NT, ND, no masses or organomegaly Musculoskeletal:  symmetrical muscle groups noted without atrophy or deformity Neuro:  gait slow and unsteady; deep tendon reflexes normal and symmetric Psych: well oriented with normal range of affect and appropriate judgment/insight  Assessment and Plan  Well adult exam - Plan: CBC, Comprehensive metabolic panel, Lipid panel  Weakness of both lower extremities - Plan: Ambulatory referral to Physical Therapy  Hypercholesterolemia - Plan: atorvastatin (LIPITOR) 20 MG tablet  Essential hypertension - Plan: amLODipine (NORVASC) 5 MG tablet  Gastroesophageal reflux disease   Well 84 y.o. male. Counseled on diet and exercise. Other orders as above. Refer to PT at his request. Would  consider neuro referral if no better.  BP readings look great.  Follow up in 6 mo or prn.  The patient voiced understanding and agreement to the plan.  [place], DO 04/03/20 2:02 PM

## 2020-04-03 NOTE — Patient Instructions (Addendum)
Give us 2-3 business days to get the results of your labs back.   Keep the diet clean and stay active.  If you do not hear anything about your referral in the next 1-2 weeks, call our office and ask for an update.  Let us know if you need anything. 

## 2020-04-04 ENCOUNTER — Other Ambulatory Visit: Payer: Self-pay

## 2020-04-04 DIAGNOSIS — E538 Deficiency of other specified B group vitamins: Secondary | ICD-10-CM

## 2020-04-04 DIAGNOSIS — Z1159 Encounter for screening for other viral diseases: Secondary | ICD-10-CM

## 2020-04-04 DIAGNOSIS — I10 Essential (primary) hypertension: Secondary | ICD-10-CM

## 2020-04-04 LAB — COMPREHENSIVE METABOLIC PANEL
ALT: 40 U/L (ref 0–53)
AST: 55 U/L — ABNORMAL HIGH (ref 0–37)
Albumin: 4.4 g/dL (ref 3.5–5.2)
Alkaline Phosphatase: 67 U/L (ref 39–117)
BUN: 13 mg/dL (ref 6–23)
CO2: 26 mEq/L (ref 19–32)
Calcium: 9.2 mg/dL (ref 8.4–10.5)
Chloride: 102 mEq/L (ref 96–112)
Creatinine, Ser: 0.93 mg/dL (ref 0.40–1.50)
GFR: 77.87 mL/min (ref >60.00)
Glucose, Bld: 94 mg/dL (ref 70–99)
Potassium: 4.6 mEq/L (ref 3.5–5.1)
Sodium: 140 mEq/L (ref 135–145)
Total Bilirubin: 1.2 mg/dL (ref 0.2–1.2)
Total Protein: 7 g/dL (ref 6.0–8.3)

## 2020-04-04 LAB — LIPID PANEL
Cholesterol: 198 mg/dL (ref 0–200)
HDL: 105.1 mg/dL (ref >39.00)
LDL Cholesterol: 77 mg/dL (ref 0–99)
NonHDL: 92.56
Total CHOL/HDL Ratio: 2
Triglycerides: 77 mg/dL (ref 0.0–149.0)
VLDL: 15.4 mg/dL (ref 0.0–40.0)

## 2020-04-04 LAB — CBC
HCT: 39.6 % (ref 39.0–52.0)
Hemoglobin: 13.7 g/dL (ref 13.0–17.0)
MCHC: 34.7 g/dL (ref 30.0–36.0)
MCV: 103.2 fl — ABNORMAL HIGH (ref 78.0–100.0)
Platelets: 137 10*3/uL — ABNORMAL LOW (ref 150.0–400.0)
RBC: 3.84 Mil/uL — ABNORMAL LOW (ref 4.22–5.81)
RDW: 12.4 % (ref 11.5–15.5)
WBC: 6.4 10*3/uL (ref 4.0–10.5)

## 2020-04-09 ENCOUNTER — Ambulatory Visit: Payer: Medicare HMO | Admitting: Podiatry

## 2020-04-09 ENCOUNTER — Other Ambulatory Visit: Payer: Self-pay

## 2020-04-09 ENCOUNTER — Encounter: Payer: Self-pay | Admitting: Podiatry

## 2020-04-09 DIAGNOSIS — M79674 Pain in right toe(s): Secondary | ICD-10-CM | POA: Diagnosis not present

## 2020-04-09 DIAGNOSIS — B351 Tinea unguium: Secondary | ICD-10-CM | POA: Diagnosis not present

## 2020-04-09 DIAGNOSIS — Q828 Other specified congenital malformations of skin: Secondary | ICD-10-CM

## 2020-04-09 DIAGNOSIS — M79675 Pain in left toe(s): Secondary | ICD-10-CM

## 2020-04-09 NOTE — Progress Notes (Signed)
This patient returns to the office for evaluation and treatment of long thick painful nails .  This patient is unable to trim his own nails since the patient cannot reach the feet.  Patient says the nails are painful walking and wearing his shoes.  He has painful callus on his right forefoot. Which he is unable to self treat.   He returns for preventive foot care services.  General Appearance  Alert, conversant and in no acute stress.  Vascular  Dorsalis pedis are palpable  bilaterally. Posterior tibial pulses are absent  B/L. Capillary return is within normal limits  bilaterally. Temperature is within normal limits  bilaterally.  Neurologic  Senn-Weinstein monofilament wire test within normal limits  bilaterally. Muscle power within normal limits bilaterally.  Nails Thick disfigured discolored nails with subungual debris  from hallux to fifth toes bilaterally. No evidence of bacterial infection or drainage bilaterally.  Orthopedic  No limitations of motion  feet .  No crepitus or effusions noted.  No bony pathology or digital deformities noted.  Skin  normotropic skin with no porokeratosis noted bilaterally.  No signs of infections or ulcers noted.   Callus sub 1 ,4 right foot.  HD 5th right. Heel callus medially right foot.  Onychomycosis  Pain in toes right foot  Pain in toes left foot  Porokeratosis sub 1,4 right foot.  Debridement  of nails  1-5  B/L with a nail nipper.  Nails were then filed using a dremel tool with no incidents.  Debridement of porokeratosis right foot with # 15 blade.   RTC 12 weeks   Gardiner Barefoot DPM

## 2020-04-15 ENCOUNTER — Encounter: Payer: Self-pay | Admitting: Physical Therapy

## 2020-04-15 ENCOUNTER — Ambulatory Visit: Payer: Medicare HMO | Attending: Family Medicine | Admitting: Physical Therapy

## 2020-04-15 ENCOUNTER — Other Ambulatory Visit: Payer: Self-pay

## 2020-04-15 DIAGNOSIS — R2689 Other abnormalities of gait and mobility: Secondary | ICD-10-CM | POA: Diagnosis not present

## 2020-04-15 DIAGNOSIS — M6281 Muscle weakness (generalized): Secondary | ICD-10-CM | POA: Insufficient documentation

## 2020-04-15 DIAGNOSIS — R29898 Other symptoms and signs involving the musculoskeletal system: Secondary | ICD-10-CM | POA: Insufficient documentation

## 2020-04-15 NOTE — Therapy (Addendum)
[facility] [address]  [place], [place], [address] Phone: [phone]   Fax:  [phone]  Physical Therapy Evaluation  Patient Details  Name: Joshua [name] MRN: [id number] Date of Birth: 04-30-1936 Referring Provider (PT): Dr Riki Sheer   Encounter Date: 04/15/2020   PT End of Session - 04/15/20 1312    Visit Number 1    Number of Visits 12    Date for PT Re-Evaluation 05/27/20    Authorization Type [employer] MCR    PT Start Time 5176    PT Stop Time 1407    PT Time Calculation (min) 55 min    Activity Tolerance Patient tolerated treatment well    Behavior During Therapy [facility] for tasks assessed/performed           Past Medical History:  Diagnosis Date  . Arthritis   . Cataract   . Esophageal stricture   . GERD (gastroesophageal reflux disease)   . Heart murmur   . Hyperlipidemia   . Hypertension   . Liver disease    hepatitis    Past Surgical History:  Procedure Laterality Date  . cataract  2013  . HEMORROIDECTOMY  2001  . HERNIA REPAIR  2005  . TONSILLECTOMY  1948    There were no vitals filed for this visit.    Subjective Assessment - 04/15/20 1312    Subjective Pt reports he feels like he has loss muscle tone in his legs.  He hasn't walked for about a year after losing his wife and this caused him to loose his motivation.    Patient Stated Goals get into a walking or silver sneakers program.    Currently in Pain? No/denies   has tingling sensation in bilat calves intermittently.             [facility] PT Assessment - 04/15/20 0001      Assessment   Medical Diagnosis LE weakness    Referring Provider (PT) Dr Riki Sheer    Onset Date/Surgical Date 04/16/19    Next MD Visit 05/02/2020    Prior Therapy none      Precautions   Precautions None      Balance Screen   Has the patient fallen in the past 6 months Yes    How many times? 1-2   not sure of number, " stumbled  "    Has the patient had a decrease in activity level because of a fear of falling?  No    Is the patient reluctant to leave their home because of a fear of falling?  Yes      [place] One level    Additional Comments has difficulty on stairs - sons house.       Prior Function   Level of Independence Independent    Vocation Retired    Leisure on Teaching laboratory technician, Advice worker, crossword puzzle       Observation/Other Assessments   Focus on Therapeutic Outcomes (FOTO)  NA - having a balance assessment      Observation/Other Assessments-Edema    Edema --   (+) in LE's Rt . LT + pitting      Functional Tests   Functional tests Squat;Single leg stance      Squat   Comments difficulty controlling the lowering       Single Leg Stance   Comments  unable bilat      Posture/Postural Control   Posture/Postural Control Postural limitations    Postural Limitations Rounded Shoulders;Forward head;Flexed trunk      ROM / Strength   AROM / PROM / Strength AROM;Strength      AROM   AROM Assessment Site Hip;Ankle    Right/Left Hip --   hip extension to neutral  on Lt 5 degrees on Rt    Right/Left Ankle --   DF PROM Lt 5, Rt 8     Strength   Strength Assessment Site Hip;Knee;Ankle    Right/Left Hip Right;Left   WNL except abduction 4+/5   Right/Left Knee --   /   Right/Left Ankle --   grossly 4+/5       Flexibility   Soft Tissue Assessment /Muscle Length yes    Hamstrings supine SLR Lt 55, Rt       Transfers   Transfers Sit to Stand    Sit to Stand With upper extremity assist;7: Independent      Ambulation/Gait   Assistive device --   uses walker at night for bathroom - safety   Gait Pattern Decreased stride length;Decreased dorsiflexion - left;Decreased dorsiflexion - right;Trunk flexed   bilat LE externally rotated     6 Minute Walk- Baseline   6 Minute Walk- Baseline yes    BP (mmHg) 132/82     HR (bpm) 78    02 Sat (%RA) 97 %      6 Minute walk- Post Test   6 Minute Walk Post Test yes    BP (mmHg) 148/88    HR (bpm) 98    02 Sat (%RA) 99 %    Perceived Rate of Exertion (Borg) 11- Fairly light      6 minute walk test results    Aerobic Endurance Distance Walked 735      Balance   Balance Assessed Yes      Standardized Balance Assessment   Standardized Balance Assessment Five Times Sit to Stand;Timed Up and Go Test    Five times sit to stand comments  22 secs      Timed Up and Go Test   Normal TUG (seconds) 20                      Objective measurements completed on examination: See above findings.               PT Education - 04/15/20 1431    Education Details POC, HEP and results of special tests    Person(s) Educated Patient    Methods Explanation;Demonstration;Handout    Comprehension Returned demonstration;Verbalized understanding               PT Long Term Goals - 04/15/20 1415      PT LONG TERM GOAL #1   Title I with advanced HEP to include transfer to community based exercise program like Silver Sneakers    Time 6    Period Weeks    Status New    Target Date 05/27/20      PT LONG TERM GOAL #2   Title improve TUG =/< 12 sec to improve safety with walking    Time 6    Period Weeks    Status New    Target Date 05/27/20      PT LONG TERM GOAL #3   Title improve 6" walking test to =/> 1050' to be in the normal range with good vitals  Time 6    Period Weeks    Status New    Target Date 05/27/20      PT LONG TERM GOAL #4   Title improve TUG =/< 15 sec to decrease fall risk    Time 6    Period Weeks    Status New    Target Date 05/27/20      PT LONG TERM GOAL #5   Title improve 5 rep sit to stand time =/> 14 sec    Time 6    Period Weeks    Status New    Target Date 05/27/20      Additional Long Term Goals   Additional Long Term Goals Yes      PT LONG TERM GOAL #6   Title improve hip and ankle  flexibilty to [facility] to allow him to meet the above goals    Time 6    Period Weeks    Status New    Target Date 05/27/20                  Plan - 04/15/20 1409    Clinical Impression Statement 84 yo male that had a decrease in activity over the last year after his wife passed and then with covid.  He has noticed LE weakness and activity intolerance and wishes to start walking again and possibly join [employer]. He has gait abnormalities, is very tight in his hips and ankles. Over all lower body strength tests well however he fatigues quickly.  He would benefit from PT to address these defecits and facilitate a transfer into a community based exercise program to include some socialization.    Personal Factors and Comorbidities Comorbidity 3+    Comorbidities arthritis, HTN, hepatitist, GERD, heart murmur    Examination-Activity Limitations Locomotion Level;Transfers;Squat;Stairs    Examination-Participation Restrictions Community Activity;Other;Yard Work    Stability/Clinical Decision Making Stable/Uncomplicated    Designer, jewellery Low    Rehab Potential Excellent    PT Frequency 2x / week    PT Duration 6 weeks    PT Treatment/Interventions Iontophoresis 4mg /ml Dexamethasone;Gait training;Stair training;Taping;Patient/family education;Functional mobility training;Moist Heat;Ultrasound;Electrical Stimulation;Cryotherapy;Therapeutic exercise;Balance training;Neuromuscular re-education;Manual techniques;Dry needling    PT Next Visit Plan ther ex to work on balance and exercise/activity tolerance    Consulted and Agree with Plan of Care Patient           Patient will benefit from skilled therapeutic intervention in order to improve the following deficits and impairments:  Abnormal gait, Difficulty walking, Decreased activity tolerance, Impaired flexibility  Visit Diagnosis: Muscle weakness (generalized) - Plan: PT plan of care cert/re-cert  Other abnormalities of gait  and mobility - Plan: PT plan of care cert/re-cert  Other symptoms and signs involving the musculoskeletal system - Plan: PT plan of care cert/re-cert     Problem List Patient Active Problem List   Diagnosis Date Noted  . Infected abrasion of fifth toe, right, initial encounter 10/31/2019  . Pain due to onychomycosis of toenails of both feet 04/25/2019  . Porokeratosis 04/25/2019  . Gastroesophageal reflux disease 04/22/2018  . Moderate calcific aortic stenosis 08/14/2016  . PVCs (premature ventricular contractions) 08/14/2016  . Hypercholesterolemia 08/14/2016  . DJD (degenerative joint disease) 08/14/2016  . Essential hypertension 07/16/2016    Jeral Pinch PT  04/15/2020, 2:31 PM  [facility] [address]  [place], [place], [address] Phone: [phone]   Fax:  [phone]  Name: Joshua [name]  Joshua [name] MRN: [id number] Date of Birth: March 24, 1936

## 2020-04-29 ENCOUNTER — Other Ambulatory Visit (INDEPENDENT_AMBULATORY_CARE_PROVIDER_SITE_OTHER): Payer: Medicare HMO

## 2020-04-29 ENCOUNTER — Other Ambulatory Visit: Payer: Self-pay

## 2020-04-29 ENCOUNTER — Ambulatory Visit: Payer: Medicare HMO | Attending: Family Medicine

## 2020-04-29 VITALS — BP 140/80 | HR 93

## 2020-04-29 DIAGNOSIS — R2689 Other abnormalities of gait and mobility: Secondary | ICD-10-CM | POA: Diagnosis not present

## 2020-04-29 DIAGNOSIS — E538 Deficiency of other specified B group vitamins: Secondary | ICD-10-CM | POA: Diagnosis not present

## 2020-04-29 DIAGNOSIS — M6281 Muscle weakness (generalized): Secondary | ICD-10-CM | POA: Diagnosis not present

## 2020-04-29 DIAGNOSIS — Z1159 Encounter for screening for other viral diseases: Secondary | ICD-10-CM

## 2020-04-29 DIAGNOSIS — I10 Essential (primary) hypertension: Secondary | ICD-10-CM | POA: Diagnosis not present

## 2020-04-29 DIAGNOSIS — R29898 Other symptoms and signs involving the musculoskeletal system: Secondary | ICD-10-CM

## 2020-04-29 NOTE — Therapy (Signed)
[facility] [address]  [place], [place], [address] Phone: [phone]   Fax:  [phone]  Physical Therapy Treatment  Patient Details  Name: Joshua [name] MRN: [id number] Date of Birth: July 13, 1936 Referring Provider (PT): Dr Riki Sheer   Encounter Date: 04/29/2020   PT End of Session - 04/29/20 1447    Visit Number 2    Number of Visits 12    Date for PT Re-Evaluation 05/27/20    Authorization Type [employer] MCR    PT Start Time 6384    PT Stop Time 1520    PT Time Calculation (min) 47 min    Activity Tolerance Patient tolerated treatment well    Behavior During Therapy [facility] for tasks assessed/performed           Past Medical History:  Diagnosis Date  . Arthritis   . Cataract   . Esophageal stricture   . GERD (gastroesophageal reflux disease)   . Heart murmur   . Hyperlipidemia   . Hypertension   . Liver disease    hepatitis    Past Surgical History:  Procedure Laterality Date  . cataract  2013  . HEMORROIDECTOMY  2001  . HERNIA REPAIR  2005  . TONSILLECTOMY  1948    Vitals:   04/29/20 1446  BP: 140/80  Pulse: 93  SpO2: 97%     Subjective Assessment - 04/29/20 1444    Subjective Pt. doing ok.    Patient Stated Goals get into a walking or silver sneakers program.    Currently in Pain? No/denies    Multiple Pain Sites No                             [facility] Adult PT Treatment/Exercise - 04/29/20 0001      Transfers   Transfers Stand to Sit    Sit to Stand With upper extremity assist;5: Supervision    Sit to Stand Details (indicate cue type and reason) cues for hand placement and weight transfer     Stand to Sit 5: Supervision    Stand to Sit Details cues for BOS next to chair to prevent fall into chair       Neuro Re-ed    Neuro Re-ed Details  at TM rail: forward backwards walking, side stepping x 5 laps each       Knee/Hip Exercises: Stretches    Passive Hamstring Stretch Right;Left;2 reps;30 seconds    Passive Hamstring Stretch Limitations B with heel propped     Hip Flexor Stretch Right;Left;1 rep;30 seconds    Hip Flexor Stretch Limitations mod thomas position     Gastroc Stretch Right;Left;1 rep;30 seconds    Gastroc Stretch Limitations into wall       Knee/Hip Exercises: Aerobic   Nustep Lvl 4, 6 min       Knee/Hip Exercises: Standing   Heel Raises Both;10 reps    Heel Raises Limitations B heel raise at chair                        PT Long Term Goals - 04/29/20 1447      PT LONG TERM GOAL #1   Title I with advanced HEP to include transfer to community based exercise program like Silver Sneakers    Time 6    Period Weeks    Status On-going      PT  LONG TERM GOAL #2   Title improve TUG =/< 12 sec to improve safety with walking    Time 6    Period Weeks    Status On-going      PT LONG TERM GOAL #3   Title improve 6" walking test to =/> 1050' to be in the normal range with good vitals    Time 6    Period Weeks    Status On-going      PT LONG TERM GOAL #4   Title improve TUG =/< 15 sec to decrease fall risk    Time 6    Period Weeks    Status On-going      PT LONG TERM GOAL #5   Title improve 5 rep sit to stand time =/> 14 sec    Time 6    Period Weeks    Status On-going      PT LONG TERM GOAL #6   Title improve hip and ankle flexibilty to [facility] to allow him to meet the above goals    Time 6    Period Weeks    Status On-going                 Plan - 04/29/20 1448    Clinical Impression Statement Ernie doing well today and reports good tolerance for all HEP activities.  Did require cueing for proper positioning with standing calf stretch at wall and for proper hand placement and proximity with sit<>stand transfers for safe performance.  Tolerated all LE strengthening and standing balance activities well today.  Does require close supervision for safety as he ambulates with wide BOS,  poor LE clearance and visible calf tightness leading to shortened strides.  Will benefit from skilled physical therapy to improve safety with ambulation and return to leisure activities.    Comorbidities arthritis, HTN, hepatitist, GERD, heart murmur    Rehab Potential Excellent    PT Frequency 2x / week    PT Treatment/Interventions Iontophoresis 4mg /ml Dexamethasone;Gait training;Stair training;Taping;Patient/family education;Functional mobility training;Moist Heat;Ultrasound;Electrical Stimulation;Cryotherapy;Therapeutic exercise;Balance training;Neuromuscular re-education;Manual techniques;Dry needling    PT Next Visit Plan ther ex to work on balance and exercise/activity tolerance    Consulted and Agree with Plan of Care Patient           Patient will benefit from skilled therapeutic intervention in order to improve the following deficits and impairments:  Abnormal gait, Difficulty walking, Decreased activity tolerance, Impaired flexibility  Visit Diagnosis: Muscle weakness (generalized)  Other abnormalities of gait and mobility  Other symptoms and signs involving the musculoskeletal system     Problem List Patient Active Problem List   Diagnosis Date Noted  . Infected abrasion of fifth toe, right, initial encounter 10/31/2019  . Pain due to onychomycosis of toenails of both feet 04/25/2019  . Porokeratosis 04/25/2019  . Gastroesophageal reflux disease 04/22/2018  . Moderate calcific aortic stenosis 08/14/2016  . PVCs (premature ventricular contractions) 08/14/2016  . Hypercholesterolemia 08/14/2016  . DJD (degenerative joint disease) 08/14/2016  . Essential hypertension 07/16/2016    Bess Harvest, PTA 04/29/20 3:33 PM   [facility] [address]  [place], [place], [address] Phone: [phone]   Fax:  [phone]  Name: SHLOMIE ROMIG MRN: [id number] Date of Birth: [birth year]

## 2020-04-30 LAB — CBC WITH DIFFERENTIAL/PLATELET
Basophils Absolute: 0.1 10*3/uL (ref 0.0–0.1)
Basophils Relative: 1.3 % (ref 0.0–3.0)
Eosinophils Absolute: 0.1 10*3/uL (ref 0.0–0.7)
Eosinophils Relative: 2.6 % (ref 0.0–5.0)
HCT: 40.7 % (ref 39.0–52.0)
Hemoglobin: 14.1 g/dL (ref 13.0–17.0)
Lymphocytes Relative: 24.1 % (ref 12.0–46.0)
Lymphs Abs: 1.4 10*3/uL (ref 0.7–4.0)
MCHC: 34.6 g/dL (ref 30.0–36.0)
MCV: 102.6 fl — ABNORMAL HIGH (ref 78.0–100.0)
Monocytes Absolute: 0.5 10*3/uL (ref 0.1–1.0)
Monocytes Relative: 9.8 % (ref 3.0–12.0)
Neutro Abs: 3.5 10*3/uL (ref 1.4–7.7)
Neutrophils Relative %: 62.2 % (ref 43.0–77.0)
Platelets: 123 10*3/uL — ABNORMAL LOW (ref 150.0–400.0)
RBC: 3.97 Mil/uL — ABNORMAL LOW (ref 4.22–5.81)
RDW: 12.1 % (ref 11.5–15.5)
WBC: 5.6 10*3/uL (ref 4.0–10.5)

## 2020-04-30 LAB — B12 AND FOLATE PANEL
Folate: >24.8 ng/mL (ref >5.9)
Vitamin B-12: 244 pg/mL (ref 211–911)

## 2020-04-30 LAB — HEPATITIS C ANTIBODY
Hepatitis C Ab: NONREACTIVE
SIGNAL TO CUT-OFF: 0.03 (ref <1.00)

## 2020-05-02 ENCOUNTER — Telehealth: Payer: Self-pay | Admitting: Family Medicine

## 2020-05-02 ENCOUNTER — Encounter: Payer: Self-pay | Admitting: Physical Therapy

## 2020-05-02 ENCOUNTER — Other Ambulatory Visit: Payer: Self-pay

## 2020-05-02 ENCOUNTER — Ambulatory Visit: Payer: Medicare HMO | Admitting: Physical Therapy

## 2020-05-02 DIAGNOSIS — R29898 Other symptoms and signs involving the musculoskeletal system: Secondary | ICD-10-CM | POA: Diagnosis not present

## 2020-05-02 DIAGNOSIS — M6281 Muscle weakness (generalized): Secondary | ICD-10-CM

## 2020-05-02 DIAGNOSIS — R2689 Other abnormalities of gait and mobility: Secondary | ICD-10-CM | POA: Diagnosis not present

## 2020-05-02 NOTE — Therapy (Signed)
[facility] [address]  [place], [place], [address] Phone: [phone]   Fax:  [phone]  Physical Therapy Treatment  Patient Details  Name: Joshua [name] MRN: [id number] Date of Birth: July 29, 1936 Referring Provider (PT): Dr Riki Sheer   Encounter Date: 05/02/2020   PT End of Session - 05/02/20 1604    Visit Number 3    Number of Visits 12    Date for PT Re-Evaluation 05/27/20    Authorization Type [employer] MCR    PT Start Time 7564    PT Stop Time 1529    PT Time Calculation (min) 40 min    Equipment Utilized During Treatment Gait belt    Activity Tolerance Patient tolerated treatment well    Behavior During Therapy [facility] for tasks assessed/performed           Past Medical History:  Diagnosis Date  . Arthritis   . Cataract   . Esophageal stricture   . GERD (gastroesophageal reflux disease)   . Heart murmur   . Hyperlipidemia   . Hypertension   . Liver disease    hepatitis    Past Surgical History:  Procedure Laterality Date  . cataract  2013  . HEMORROIDECTOMY  2001  . HERNIA REPAIR  2005  . TONSILLECTOMY  1948    There were no vitals filed for this visit.   Subjective Assessment - 05/02/20 1451    Subjective Has been performing his HEP- been trying behave. Denies recent falls.    Pertinent History liver disease, HTN, HLD, GERD    Patient Stated Goals get into a walking or silver sneakers program.    Currently in Pain? No/denies                             [facility] Adult PT Treatment/Exercise - 05/02/20 0001      Ambulation/Gait   Ambulation Distance (Feet) 90 Feet    Assistive device Straight cane    Gait Pattern Decreased stride length;Decreased dorsiflexion - left;Decreased dorsiflexion - right;Trunk flexed;Step-to pattern;Right flexed knee in stance;Left flexed knee in stance;Wide base of support    Gait Comments gait training with CGA and manual and verbal  cueing for [facility] sequencing      Neuro Re-ed    Neuro Re-ed Details  tandem walk 4x length of counter, grapevine 2x length of counter   with UE support on counter     Knee/Hip Exercises: Aerobic   Nustep Lvl 4, 6 min       Knee/Hip Exercises: Standing   Functional Squat 1 set;10 reps    Functional Squat Limitations mini squat to tolerance    Other Standing Knee Exercises alt foot tap on 9" step 2x10   1st step holding onto counter, 2nd set 3 fingers on counter     Knee/Hip Exercises: Seated   Sit to Sand 1 set;10 reps;with UE support   CGA and cueing for set up and forward trunk lean                 PT Education - 05/02/20 1604    Education Details update to HEP; advised patient to bring [facility] next session    Person(s) Educated Patient    Methods Explanation;Demonstration;Tactile cues;Handout;Verbal cues    Comprehension Verbalized understanding;Returned demonstration               PT Long Term Goals - 04/29/20 1447  PT LONG TERM GOAL #1   Title I with advanced HEP to include transfer to community based exercise program like Silver Sneakers    Time 6    Period Weeks    Status On-going      PT LONG TERM GOAL #2   Title improve TUG =/< 12 sec to improve safety with walking    Time 6    Period Weeks    Status On-going      PT LONG TERM GOAL #3   Title improve 6" walking test to =/> 1050' to be in the normal range with good vitals    Time 6    Period Weeks    Status On-going      PT LONG TERM GOAL #4   Title improve TUG =/< 15 sec to decrease fall risk    Time 6    Period Weeks    Status On-going      PT LONG TERM GOAL #5   Title improve 5 rep sit to stand time =/> 14 sec    Time 6    Period Weeks    Status On-going      PT LONG TERM GOAL #6   Title improve hip and ankle flexibilty to [facility] to allow him to meet the above goals    Time 6    Period Weeks    Status On-going                 Plan - 05/02/20 1605    Clinical Impression  Statement Patient without new complaints today. Reports compliance with HEP and denies questions. Patient reports that he has not tried walking for exercise d/t fear of falling, thus worked on reviewing [facility] sequencing to improve stability. Patient required manual and verbal cueing for [facility] sequencing, with good effort but still demonstrating anterior trunk lean and short B step length throughout. Reviewed STS transfers with patient demonstrating ability to perform with single UE support with cueing for proper set up and increased anterior trunk lean. Standing balance exercises were performed with varying amounts of UE support d/t unsteadiness, but patient did not demonstrate LOB. Updated HEP with slight changes to initial HEP to increase challenge- patient reported understanding and without complaints at end of session.    Comorbidities arthritis, HTN, hepatitist, GERD, heart murmur    Rehab Potential Excellent    PT Frequency 2x / week    PT Treatment/Interventions Iontophoresis 4mg /ml Dexamethasone;Gait training;Stair training;Taping;Patient/family education;Functional mobility training;Moist Heat;Ultrasound;Electrical Stimulation;Cryotherapy;Therapeutic exercise;Balance training;Neuromuscular re-education;Manual techniques;Dry needling    PT Next Visit Plan gait training with SPC; ther ex to work on balance and exercise/activity tolerance    Consulted and Agree with Plan of Care Patient           Patient will benefit from skilled therapeutic intervention in order to improve the following deficits and impairments:  Abnormal gait, Difficulty walking, Decreased activity tolerance, Impaired flexibility  Visit Diagnosis: Muscle weakness (generalized)  Other abnormalities of gait and mobility  Other symptoms and signs involving the musculoskeletal system     Problem List Patient Active Problem List   Diagnosis Date Noted  . Infected abrasion of fifth toe, right, initial encounter 10/31/2019    . Pain due to onychomycosis of toenails of both feet 04/25/2019  . Porokeratosis 04/25/2019  . Gastroesophageal reflux disease 04/22/2018  . Moderate calcific aortic stenosis 08/14/2016  . PVCs (premature ventricular contractions) 08/14/2016  . Hypercholesterolemia 08/14/2016  . DJD (degenerative joint disease) 08/14/2016  . Essential hypertension  07/16/2016     Janene Harvey, PT, DPT 05/02/20 4:08 PM   [facility] [address]  [place], [place], [address] Phone: [phone]   Fax:  [phone]  Name: ULISSES VONDRAK MRN: [id number] Date of Birth: 18-Mar-1936

## 2020-05-02 NOTE — Telephone Encounter (Signed)
Caller: Deundra Call back phone number : [phone]   Patient is calling stating PT is requesting additional 4 therapies. Please contact insurance company to get approval.  PT: [facility] [address]  [place], [place], [address] Phone: [phone]   Fax:  [phone]

## 2020-05-03 ENCOUNTER — Other Ambulatory Visit: Payer: Self-pay | Admitting: Family Medicine

## 2020-05-03 DIAGNOSIS — R29898 Other symptoms and signs involving the musculoskeletal system: Secondary | ICD-10-CM

## 2020-05-03 NOTE — Telephone Encounter (Signed)
Referral done for PT///spoke to the patient to confirm this

## 2020-05-06 ENCOUNTER — Other Ambulatory Visit: Payer: Self-pay

## 2020-05-06 ENCOUNTER — Encounter: Payer: Self-pay | Admitting: Physical Therapy

## 2020-05-06 ENCOUNTER — Ambulatory Visit: Payer: Medicare HMO | Admitting: Physical Therapy

## 2020-05-06 DIAGNOSIS — M6281 Muscle weakness (generalized): Secondary | ICD-10-CM

## 2020-05-06 DIAGNOSIS — R29898 Other symptoms and signs involving the musculoskeletal system: Secondary | ICD-10-CM

## 2020-05-06 DIAGNOSIS — R2689 Other abnormalities of gait and mobility: Secondary | ICD-10-CM

## 2020-05-06 NOTE — Therapy (Signed)
[facility] [address]  [place], [place], [address] Phone: [phone]   Fax:  [phone]  Physical Therapy Treatment  Patient Details  Name: Joshua [name] MRN: [id number] Date of Birth: 12-11-1935 Referring Provider (PT): Dr Riki Sheer   Encounter Date: 05/06/2020   PT End of Session - 05/06/20 1530    Visit Number 4    Number of Visits 12    Date for PT Re-Evaluation 05/27/20    Authorization Type [employer] MCR    PT Start Time 5809    PT Stop Time 1525    PT Time Calculation (min) 38 min    Equipment Utilized During Treatment Gait belt    Activity Tolerance Patient tolerated treatment well    Behavior During Therapy [facility] for tasks assessed/performed           Past Medical History:  Diagnosis Date  . Arthritis   . Cataract   . Esophageal stricture   . GERD (gastroesophageal reflux disease)   . Heart murmur   . Hyperlipidemia   . Hypertension   . Liver disease    hepatitis    Past Surgical History:  Procedure Laterality Date  . cataract  2013  . HEMORROIDECTOMY  2001  . HERNIA REPAIR  2005  . TONSILLECTOMY  1948    There were no vitals filed for this visit.   Subjective Assessment - 05/06/20 1450    Subjective Not much is new. Remembered to bring his cane. No problems with HEP.    Pertinent History liver disease, HTN, HLD, GERD    Patient Stated Goals get into a walking or silver sneakers program.    Currently in Pain? No/denies                             [facility] Adult PT Treatment/Exercise - 05/06/20 0001      Ambulation/Gait   Ambulation Distance (Feet) 200 Feet    Assistive device Straight cane    Gait Pattern Decreased stride length;Decreased dorsiflexion - left;Decreased dorsiflexion - right;Trunk flexed;Step-to pattern;Right flexed knee in stance;Left flexed knee in stance;Wide base of support    Ambulation Surface Unlevel;Indoor    Gait Comments gait  training with patient's and clinic's SPC with cueing to increase step length      Neuro Re-ed    Neuro Re-ed Details  alt foot tap on 9" step 2x10 w/ 2 finger support on counter top;  lateral stepping over beanbags with 2 fingers support on counter x5 min; 4 square step test in CW/CCW directions with CGA and varying UE support; backwards walking with 1 finger support on counter top 4x length of counter      Knee/Hip Exercises: Aerobic   Nustep Lvl 5, 6 min       Knee/Hip Exercises: Seated   Sit to Sand 10 reps;with UE support;2 sets   1 UE support; cues to scoot forward and lean chest forward                      PT Long Term Goals - 04/29/20 1447      PT LONG TERM GOAL #1   Title I with advanced HEP to include transfer to community based exercise program like Silver Sneakers    Time 6    Period Weeks    Status On-going      PT LONG TERM GOAL #2  Title improve TUG =/< 12 sec to improve safety with walking    Time 6    Period Weeks    Status On-going      PT LONG TERM GOAL #3   Title improve 6" walking test to =/> 1050' to be in the normal range with good vitals    Time 6    Period Weeks    Status On-going      PT LONG TERM GOAL #4   Title improve TUG =/< 15 sec to decrease fall risk    Time 6    Period Weeks    Status On-going      PT LONG TERM GOAL #5   Title improve 5 rep sit to stand time =/> 14 sec    Time 6    Period Weeks    Status On-going      PT LONG TERM GOAL #6   Title improve hip and ankle flexibilty to [facility] to allow him to meet the above goals    Time 6    Period Weeks    Status On-going                 Plan - 05/06/20 1530    Clinical Impression Statement Patient arrived to session with SPC. Worked on gait training with patient's cane, however this appeared too low, requiring patient to anteriorly lean trunk forward. Better gait pattern was demonstrated with taller cane. Cueing was required to increase B step length, but patient  did a good job of demonstrating proper SPC sequencing on his own. Patient did demonstrate difficulty performing STS with 1 UE support, requiring reminders to scoot bottom forward and promote anterior trunk lean. Patient performed dynamic balance training with focus on improving patient's comfort/confidence with decreased UE support. Patient demonstrated good effort with session today and without complaints at end of session.    Comorbidities arthritis, HTN, hepatitist, GERD, heart murmur    Rehab Potential Excellent    PT Frequency 2x / week    PT Treatment/Interventions Iontophoresis 4mg /ml Dexamethasone;Gait training;Stair training;Taping;Patient/family education;Functional mobility training;Moist Heat;Ultrasound;Electrical Stimulation;Cryotherapy;Therapeutic exercise;Balance training;Neuromuscular re-education;Manual techniques;Dry needling    PT Next Visit Plan gait training with SPC; ther ex to work on balance and exercise/activity tolerance    Consulted and Agree with Plan of Care Patient           Patient will benefit from skilled therapeutic intervention in order to improve the following deficits and impairments:  Abnormal gait, Difficulty walking, Decreased activity tolerance, Impaired flexibility  Visit Diagnosis: Muscle weakness (generalized)  Other abnormalities of gait and mobility  Other symptoms and signs involving the musculoskeletal system     Problem List Patient Active Problem List   Diagnosis Date Noted  . Infected abrasion of fifth toe, right, initial encounter 10/31/2019  . Pain due to onychomycosis of toenails of both feet 04/25/2019  . Porokeratosis 04/25/2019  . Gastroesophageal reflux disease 04/22/2018  . Moderate calcific aortic stenosis 08/14/2016  . PVCs (premature ventricular contractions) 08/14/2016  . Hypercholesterolemia 08/14/2016  . DJD (degenerative joint disease) 08/14/2016  . Essential hypertension 07/16/2016    Joshua [name], PT,  DPT 05/06/20 3:32 PM   [facility] [address]  [place], [place], [address] Phone: [phone]   Fax:  [phone]  Name: Joshua [name] MRN: [id number] Date of Birth: 18-May-1936

## 2020-05-09 ENCOUNTER — Ambulatory Visit: Payer: Medicare HMO

## 2020-05-09 ENCOUNTER — Other Ambulatory Visit: Payer: Self-pay

## 2020-05-09 DIAGNOSIS — M6281 Muscle weakness (generalized): Secondary | ICD-10-CM

## 2020-05-09 DIAGNOSIS — R29898 Other symptoms and signs involving the musculoskeletal system: Secondary | ICD-10-CM | POA: Diagnosis not present

## 2020-05-09 DIAGNOSIS — R2689 Other abnormalities of gait and mobility: Secondary | ICD-10-CM

## 2020-05-09 NOTE — Therapy (Signed)
[facility] [address]  [place], [place], [address] Phone: [phone]   Fax:  [phone]  Physical Therapy Treatment  Patient Details  Name: Joshua [name] MRN: [id number] Date of Birth: 1936-03-03 Referring Provider (PT): Dr Riki Sheer   Encounter Date: 05/09/2020   PT End of Session - 05/09/20 1451    Visit Number 5    Number of Visits 12    Date for PT Re-Evaluation 05/27/20    Authorization Type [employer] MCR    PT Start Time 5462    PT Stop Time 1525    PT Time Calculation (min) 38 min    Equipment Utilized During Treatment --    Activity Tolerance Patient tolerated treatment well    Behavior During Therapy [facility] for tasks assessed/performed           Past Medical History:  Diagnosis Date  . Arthritis   . Cataract   . Esophageal stricture   . GERD (gastroesophageal reflux disease)   . Heart murmur   . Hyperlipidemia   . Hypertension   . Liver disease    hepatitis    Past Surgical History:  Procedure Laterality Date  . cataract  2013  . HEMORROIDECTOMY  2001  . HERNIA REPAIR  2005  . TONSILLECTOMY  1948    There were no vitals filed for this visit.   Subjective Assessment - 05/09/20 1449    Subjective Doing well.    Pertinent History liver disease, HTN, HLD, GERD    Patient Stated Goals get into a walking or silver sneakers program.    Currently in Pain? No/denies    Multiple Pain Sites No              [facility] PT Assessment - 05/09/20 0001      Assessment   Medical Diagnosis LE weakness    Referring Provider (PT) Dr Riki Sheer    Onset Date/Surgical Date 04/16/19    Hand Dominance Right    Next MD Visit 10/09/20    Prior Therapy none      Timed Up and Go Test   Normal TUG (seconds) 16.26                         [facility] Adult PT Treatment/Exercise - 05/09/20 0001      Transfers   Five time sit to stand comments  15.80   B UE pushoff from chair  with armrests      Neuro Re-ed    Neuro Re-ed Details  Tandem forward walk/backwards walking at counter with therapist supervision x 2 laps       Knee/Hip Exercises: Stretches   Gastroc Stretch Right;Left;30 seconds;2 reps    Press photographer Limitations into counter       Knee/Hip Exercises: Aerobic   Nustep Lvl 5, 6 min       Knee/Hip Exercises: Standing   Heel Raises Both;15 reps    Heel Raises Limitations counter - heel/toe raise    Knee Flexion Right;Left;10 reps;Strengthening    Knee Flexion Limitations counter     Hip Flexion Right;Left;10 reps    Hip Abduction Right;Left;10 reps;Knee straight;Stengthening    Abduction Limitations counter     Hip Extension Right;Left;Knee straight;Stengthening;5 reps    Extension Limitations counter     Functional Squat --   x 12 reps    Functional Squat Limitations mini squat to tolerance  PT Long Term Goals - 04/29/20 1447      PT LONG TERM GOAL #1   Title I with advanced HEP to include transfer to community based exercise program like Silver Sneakers    Time 6    Period Weeks    Status On-going      PT LONG TERM GOAL #2   Title improve TUG =/< 12 sec to improve safety with walking    Time 6    Period Weeks    Status On-going      PT LONG TERM GOAL #3   Title improve 6" walking test to =/> 1050' to be in the normal range with good vitals    Time 6    Period Weeks    Status On-going      PT LONG TERM GOAL #4   Title improve TUG =/< 15 sec to decrease fall risk    Time 6    Period Weeks    Status On-going      PT LONG TERM GOAL #5   Title improve 5 rep sit to stand time =/> 14 sec    Time 6    Period Weeks    Status On-going      PT LONG TERM GOAL #6   Title improve hip and ankle flexibilty to [facility] to allow him to meet the above goals    Time 6    Period Weeks    Status On-going                 Plan - 05/09/20 1451    Clinical Impression Statement Patient doing well  reporting the walking exercise his counter are getting easier.  Pt. noting he leaves 31 July and comes back on 5th of Aug.  Wishes to have two more PT sessions after he returns from vacation to [place].  Able to perform TUG with SPC in 16.26 sec demonstrating improved functional mobility and progressing toward LTG #2.  Progressed dynamic balance and LE strengthening activities without issue today.  Pt. progressing well toward LTGs.    Comorbidities arthritis, HTN, hepatitist, GERD, heart murmur    Rehab Potential Excellent    PT Frequency 2x / week    PT Treatment/Interventions Iontophoresis 4mg /ml Dexamethasone;Gait training;Stair training;Taping;Patient/family education;Functional mobility training;Moist Heat;Ultrasound;Electrical Stimulation;Cryotherapy;Therapeutic exercise;Balance training;Neuromuscular re-education;Manual techniques;Dry needling    PT Next Visit Plan gait training with SPC; ther ex to work on balance and exercise/activity tolerance    Consulted and Agree with Plan of Care Patient           Patient will benefit from skilled therapeutic intervention in order to improve the following deficits and impairments:     Visit Diagnosis: Muscle weakness (generalized)  Other abnormalities of gait and mobility  Other symptoms and signs involving the musculoskeletal system     Problem List Patient Active Problem List   Diagnosis Date Noted  . Infected abrasion of fifth toe, right, initial encounter 10/31/2019  . Pain due to onychomycosis of toenails of both feet 04/25/2019  . Porokeratosis 04/25/2019  . Gastroesophageal reflux disease 04/22/2018  . Moderate calcific aortic stenosis 08/14/2016  . PVCs (premature ventricular contractions) 08/14/2016  . Hypercholesterolemia 08/14/2016  . DJD (degenerative joint disease) 08/14/2016  . Essential hypertension 07/16/2016    Bess Harvest, PTA 05/09/20 4:44 PM   [facility] [address]  [place], [place], [address] Phone: [phone]   Fax:  [phone]  Name: Joshua Tuckey  [name] MRN: [id number] Date of Birth: 05-20-1936

## 2020-05-13 ENCOUNTER — Ambulatory Visit: Payer: Medicare HMO | Admitting: Physical Therapy

## 2020-05-13 ENCOUNTER — Other Ambulatory Visit: Payer: Self-pay

## 2020-05-13 ENCOUNTER — Encounter: Payer: Self-pay | Admitting: Physical Therapy

## 2020-05-13 DIAGNOSIS — M6281 Muscle weakness (generalized): Secondary | ICD-10-CM | POA: Diagnosis not present

## 2020-05-13 DIAGNOSIS — R29898 Other symptoms and signs involving the musculoskeletal system: Secondary | ICD-10-CM

## 2020-05-13 DIAGNOSIS — R2689 Other abnormalities of gait and mobility: Secondary | ICD-10-CM | POA: Diagnosis not present

## 2020-05-13 NOTE — Therapy (Signed)
[facility] [address]  [place], [place], [address] Phone: [phone]   Fax:  [phone]  Physical Therapy Treatment  Patient Details  Name: Joshua [name] MRN: [id number] Date of Birth: 1936-05-29 Referring Provider (PT): Dr Riki Sheer   Encounter Date: 05/13/2020   PT End of Session - 05/13/20 1532    Visit Number 6    Number of Visits 12    Date for PT Re-Evaluation 05/27/20    Authorization Type [employer] MCR    PT Start Time 6294    PT Stop Time 1530    PT Time Calculation (min) 45 min    Activity Tolerance Patient tolerated treatment well    Behavior During Therapy [facility] for tasks assessed/performed           Past Medical History:  Diagnosis Date  . Arthritis   . Cataract   . Esophageal stricture   . GERD (gastroesophageal reflux disease)   . Heart murmur   . Hyperlipidemia   . Hypertension   . Liver disease    hepatitis    Past Surgical History:  Procedure Laterality Date  . cataract  2013  . HEMORROIDECTOMY  2001  . HERNIA REPAIR  2005  . TONSILLECTOMY  1948    There were no vitals filed for this visit.   Subjective Assessment - 05/13/20 1446    Subjective Feels comfortable walking with the cane.    Pertinent History liver disease, HTN, HLD, GERD    Patient Stated Goals get into a walking or silver sneakers program.    Currently in Pain? No/denies                             [facility] Adult PT Treatment/Exercise - 05/13/20 0001      Ambulation/Gait   Ambulation Distance (Feet) 130 Feet    Assistive device Straight cane    Gait Pattern Decreased stride length;Decreased dorsiflexion - left;Decreased dorsiflexion - right;Trunk flexed;Step-to pattern;Right flexed knee in stance;Left flexed knee in stance;Wide base of support    Ambulation Surface Level;Indoor    Gait Comments gait training with patient's SPC with cueing to increase step length      Neuro  Re-ed    Neuro Re-ed Details  anterior and posterior hip strategy at wall x6 min   cueing for proper sequencing and glute activation     Knee/Hip Exercises: Aerobic   Nustep Lvl 5, 5 min       Knee/Hip Exercises: Standing   Other Standing Knee Exercises alt foot tap on 9" step with 3 fingers support on counter and 3# ankle wts 3x30"   16 reps, 21 reps, 26 reps   Other Standing Knee Exercises standing scapular row with red TB x10   supervision for safety     Knee/Hip Exercises: Seated   Sit to Sand 10 reps;with UE support;2 sets;without UE support   11st set with 1 UE support; 2nd set with 1 airex and no UE                  PT Education - 05/13/20 1532    Education Details update to HEP    Person(s) Educated Patient    Methods Explanation;Demonstration;Tactile cues;Verbal cues;Handout    Comprehension Verbalized understanding;Returned demonstration               PT Long Term Goals - 04/29/20 1447  PT LONG TERM GOAL #1   Title I with advanced HEP to include transfer to community based exercise program like Silver Sneakers    Time 6    Period Weeks    Status On-going      PT LONG TERM GOAL #2   Title improve TUG =/< 12 sec to improve safety with walking    Time 6    Period Weeks    Status On-going      PT LONG TERM GOAL #3   Title improve 6" walking test to =/> 1050' to be in the normal range with good vitals    Time 6    Period Weeks    Status On-going      PT LONG TERM GOAL #4   Title improve TUG =/< 15 sec to decrease fall risk    Time 6    Period Weeks    Status On-going      PT LONG TERM GOAL #5   Title improve 5 rep sit to stand time =/> 14 sec    Time 6    Period Weeks    Status On-going      PT LONG TERM GOAL #6   Title improve hip and ankle flexibilty to [facility] to allow him to meet the above goals    Time 6    Period Weeks    Status On-going                 Plan - 05/13/20 1533    Clinical Impression Statement Patient without  new complaints at start of session, but still demonstrating some difficulty with STS transfers from waiting room and difficulty with [facility] sequencing. Practiced gait training with SPC with better sequencing and ability to demonstrate increased step length with cueing. Patient was able to demonstrate STS from elevated surface without UE support. Patient with inconsistent anterior trunk lean throughout reps. Worked on increasing speed with resisted foot taps on step with patient able to increase number of reps under time constraint with each subsequent rep. Demonstrated good form and tolerance for resisted rows, thus this exercise was updated into HEP. Patient reported understanding and without complaints at end of session.    Comorbidities arthritis, HTN, hepatitist, GERD, heart murmur    Rehab Potential Excellent    PT Frequency 2x / week    PT Treatment/Interventions Iontophoresis 4mg /ml Dexamethasone;Gait training;Stair training;Taping;Patient/family education;Functional mobility training;Moist Heat;Ultrasound;Electrical Stimulation;Cryotherapy;Therapeutic exercise;Balance training;Neuromuscular re-education;Manual techniques;Dry needling    PT Next Visit Plan gait training with SPC; ther ex to work on balance and exercise/activity tolerance    Consulted and Agree with Plan of Care Patient           Patient will benefit from skilled therapeutic intervention in order to improve the following deficits and impairments:     Visit Diagnosis: Muscle weakness (generalized)  Other abnormalities of gait and mobility  Other symptoms and signs involving the musculoskeletal system     Problem List Patient Active Problem List   Diagnosis Date Noted  . Infected abrasion of fifth toe, right, initial encounter 10/31/2019  . Pain due to onychomycosis of toenails of both feet 04/25/2019  . Porokeratosis 04/25/2019  . Gastroesophageal reflux disease 04/22/2018  . Moderate calcific aortic stenosis  08/14/2016  . PVCs (premature ventricular contractions) 08/14/2016  . Hypercholesterolemia 08/14/2016  . DJD (degenerative joint disease) 08/14/2016  . Essential hypertension 07/16/2016     Janene Harvey, PT, DPT 05/13/20 3:37 PM   [facility] [address]  [place], [place], [address] Phone: [phone]   Fax:  [phone]  Name: Joshua [name] MRN: [id number] Date of Birth: [birth year]

## 2020-05-15 ENCOUNTER — Ambulatory Visit: Payer: Medicare HMO | Admitting: Physical Therapy

## 2020-05-15 ENCOUNTER — Other Ambulatory Visit: Payer: Self-pay

## 2020-05-15 ENCOUNTER — Encounter: Payer: Self-pay | Admitting: Physical Therapy

## 2020-05-15 DIAGNOSIS — R2689 Other abnormalities of gait and mobility: Secondary | ICD-10-CM

## 2020-05-15 DIAGNOSIS — R29898 Other symptoms and signs involving the musculoskeletal system: Secondary | ICD-10-CM

## 2020-05-15 DIAGNOSIS — M6281 Muscle weakness (generalized): Secondary | ICD-10-CM

## 2020-05-15 NOTE — Therapy (Signed)
[facility] [address]  [place], [place], [address] Phone: [phone]   Fax:  [phone]  Physical Therapy Treatment  Patient Details  Name: Joshua [name] MRN: [id number] Date of Birth: 1936-07-05 Referring Provider (PT): Dr Riki Sheer   Encounter Date: 05/15/2020   PT End of Session - 05/15/20 1637    Visit Number 7    Number of Visits 19    Date for PT Re-Evaluation 06/26/20    Authorization Type [employer] MCR    PT Start Time 4709    PT Stop Time 1526    PT Time Calculation (min) 39 min    Equipment Utilized During Treatment Gait belt    Activity Tolerance Patient tolerated treatment well    Behavior During Therapy [facility] for tasks assessed/performed           Past Medical History:  Diagnosis Date  . Arthritis   . Cataract   . Esophageal stricture   . GERD (gastroesophageal reflux disease)   . Heart murmur   . Hyperlipidemia   . Hypertension   . Liver disease    hepatitis    Past Surgical History:  Procedure Laterality Date  . cataract  2013  . HEMORROIDECTOMY  2001  . HERNIA REPAIR  2005  . TONSILLECTOMY  1948    There were no vitals filed for this visit.   Subjective Assessment - 05/15/20 1448    Subjective Forgot his cane at home which is why he is a little late. Notes that he feels a little more stable and that he can walk a little better than before. Would like to continue working on calf and HS stretching.    Pertinent History liver disease, HTN, HLD, GERD    Patient Stated Goals get into a walking or silver sneakers program.    Currently in Pain? No/denies              [facility] PT Assessment - 05/15/20 0001      Assessment   Medical Diagnosis LE weakness    Referring Provider (PT) Dr Riki Sheer    Onset Date/Surgical Date 04/16/19      AROM   Right/Left Ankle Right;Left    Right Ankle Dorsiflexion 11    Left Ankle Dorsiflexion 18      6 Minute Walk-  Baseline   6 Minute Walk- Baseline yes    BP (mmHg) 118/65    HR (bpm) 85    02 Sat (%RA) 96 %      6 Minute walk- Post Test   6 Minute Walk Post Test yes    BP (mmHg) 144/60    HR (bpm) 101    02 Sat (%RA) 96 %      6 minute walk test results    Aerobic Endurance Distance Walked 890    Endurance additional comments with SPC      Standardized Balance Assessment   Five times sit to stand comments  16.06   with 1 UE support     Timed Up and Go Test   Normal TUG (seconds) 15.89   with SPC                        [facility] Adult PT Treatment/Exercise - 05/15/20 0001      Knee/Hip Exercises: Aerobic   Nustep Lvl 5, 6 min  PT Education - 05/15/20 1636    Education Details discussion on objective progress and remaining impairments; administered info for [employer] program near patient's home    Person(s) Educated Patient    Methods Explanation;Demonstration;Handout    Comprehension Verbalized understanding               PT Long Term Goals - 05/15/20 1450      PT LONG TERM GOAL #1   Title I with advanced HEP to include transfer to community based exercise program like [employer]ne[employer]    Time 6    Period Weeks    Status Partially Met   reports that he is planning to join silver sneakers after completing POC- info administered today   Target Date 06/26/20      PT LONG TERM GOAL #2   Title improve TUG =/< 12 sec to improve safety with walking    Time 6    Period Weeks    Status On-going   05/15/20 15.89 sec with [facility]   Target Date 06/26/20      PT LONG TERM GOAL #3   Title improve 6" walking test to =/> 1050' to be in the normal range with good vitals    Time 6    Period Weeks    Status On-going   05/15/20 891 ft with stable vitals   Target Date 06/26/20      PT LONG TERM GOAL #4   Title improve TUG =/< 15 sec to decrease fall risk    Time 6    Period Weeks    Status On-going   05/15/20 15.89 sec with [facility]   Target Date  06/26/20      PT LONG TERM GOAL #5   Title improve 5 rep sit to stand time =/> 14 sec    Time 6    Period Weeks    Status On-going   05/15/20 16.06 sec   Target Date 06/26/20      PT LONG TERM GOAL #6   Title improve hip and ankle flexibilty to [facility] to allow him to meet the above goals    Time 6    Period Weeks    Status Achieved                 Plan - 05/15/20 1641    Clinical Impression Statement Patient arrived to session reports that he feels a little more stable and that he can walk a little better than before starting therapy. Would like to continue working on calf and HS stretching. Patient reported interest in starting a community fitness program but had not started, thus info for a [employer] program near the patient's home was administered. Patient has demonstrated good improvement in scores for TUG, 5xSTS, and 6 minute walk test, indicating an improvement in his fall risk, endurance, and functional activity tolerance. Patient has also met his ankle dorsiflexion goal at this time. Patient still demonstrates particular difficulty with STS transfers and balance. Would benefit from continued skilled PT services 2x/week for 6 weeks to address remaining goals.    Comorbidities arthritis, HTN, hepatitist, GERD, heart murmur    Rehab Potential Excellent    PT Frequency 2x / week    PT Duration 6 weeks    PT Treatment/Interventions Gait training;Stair training;Taping;Patient/family education;Functional mobility training;Moist Heat;Ultrasound;Electrical Stimulation;Cryotherapy;Therapeutic exercise;Balance training;Neuromuscular re-education;Manual techniques;Dry needling;Therapeutic activities;Passive range of motion    PT Next Visit Plan gait training with SPC; ther ex to work on balance and exercise/activity tolerance  Consulted and Agree with Plan of Care Patient           Patient will benefit from skilled therapeutic intervention in order to improve the following  deficits and impairments:  Abnormal gait, Difficulty walking, Decreased activity tolerance, Impaired flexibility, Decreased strength, Decreased balance, Increased muscle spasms, Improper body mechanics, Decreased range of motion, Postural dysfunction  Visit Diagnosis: Muscle weakness (generalized)  Other abnormalities of gait and mobility  Other symptoms and signs involving the musculoskeletal system     Problem List Patient Active Problem List   Diagnosis Date Noted  . Infected abrasion of fifth toe, right, initial encounter 10/31/2019  . Pain due to onychomycosis of toenails of both feet 04/25/2019  . Porokeratosis 04/25/2019  . Gastroesophageal reflux disease 04/22/2018  . Moderate calcific aortic stenosis 08/14/2016  . PVCs (premature ventricular contractions) 08/14/2016  . Hypercholesterolemia 08/14/2016  . DJD (degenerative joint disease) 08/14/2016  . Essential hypertension 07/16/2016     Janene Harvey, PT, DPT 05/15/20 4:49 PM   [facility] [address]  [place], [place], [address] Phone: [phone]   Fax:  [phone]  Name: Joshua [name] MRN: [id number] Date of Birth: 11-14-1935

## 2020-05-16 ENCOUNTER — Ambulatory Visit: Payer: Medicare HMO

## 2020-05-20 ENCOUNTER — Ambulatory Visit: Payer: Medicare HMO

## 2020-05-23 ENCOUNTER — Ambulatory Visit: Payer: Medicare HMO

## 2020-05-27 ENCOUNTER — Ambulatory Visit: Payer: Medicare HMO | Admitting: Physical Therapy

## 2020-05-30 ENCOUNTER — Ambulatory Visit: Payer: Medicare HMO

## 2020-05-31 ENCOUNTER — Other Ambulatory Visit: Payer: Self-pay | Admitting: Family Medicine

## 2020-05-31 DIAGNOSIS — E7800 Pure hypercholesterolemia, unspecified: Secondary | ICD-10-CM

## 2020-06-03 ENCOUNTER — Other Ambulatory Visit: Payer: Self-pay

## 2020-06-03 ENCOUNTER — Ambulatory Visit: Payer: Medicare HMO | Attending: Family Medicine

## 2020-06-03 DIAGNOSIS — M6281 Muscle weakness (generalized): Secondary | ICD-10-CM | POA: Diagnosis not present

## 2020-06-03 DIAGNOSIS — R2689 Other abnormalities of gait and mobility: Secondary | ICD-10-CM | POA: Insufficient documentation

## 2020-06-03 DIAGNOSIS — R29898 Other symptoms and signs involving the musculoskeletal system: Secondary | ICD-10-CM | POA: Diagnosis not present

## 2020-06-03 NOTE — Therapy (Signed)
[facility] [address]  [place], [place], [address] Phone: [phone]   Fax:  [phone]  Physical Therapy Treatment  Patient Details  Name: Joshua [name] MRN: [id number] Date of Birth: [birth year] Referring Provider (PT): Dr Riki Sheer   Encounter Date: 06/03/2020   PT End of Session - 06/03/20 1456    Visit Number 8    Number of Visits 19    Date for PT Re-Evaluation 06/26/20    Authorization Type [employer] MCR    PT Start Time 9390    PT Stop Time 1524    PT Time Calculation (min) 39 min    Equipment Utilized During Treatment --    Activity Tolerance Patient tolerated treatment well    Behavior During Therapy [facility] for tasks assessed/performed           Past Medical History:  Diagnosis Date  . Arthritis   . Cataract   . Esophageal stricture   . GERD (gastroesophageal reflux disease)   . Heart murmur   . Hyperlipidemia   . Hypertension   . Liver disease    hepatitis    Past Surgical History:  Procedure Laterality Date  . cataract  2013  . HEMORROIDECTOMY  2001  . HERNIA REPAIR  2005  . TONSILLECTOMY  1948    There were no vitals filed for this visit.   Subjective Assessment - 06/03/20 1446    Subjective Pt. noting he fell forward in his garden landing on hands on 05/24/20.  Pt. noting he did not hurt himself from the fall however had to "scoot" himself inside over concrete on the way in his house which left scrapes on his L LE/UE.  Pt. reporting it took him 30 min to scoot in the house and he had to sleep on the floor once inside his home as he was unable to get up.    Pertinent History liver disease, HTN, HLD, GERD    Patient Stated Goals get into a walking or silver sneakers program.    Currently in Pain? No/denies    Multiple Pain Sites No                             [facility] Adult PT Treatment/Exercise - 06/03/20 0001      Self-Care   Self-Care Other Self-Care  Comments    Other Self-Care Comments  Discussion of fall on 06/03/20 and fall prevention strategies       Knee/Hip Exercises: Stretches   Gastroc Stretch Right;Left;2 reps;30 seconds    Gastroc Stretch Limitations into wall     Other Knee/Hip Stretches B calf stretch x 30 sec on wall       Knee/Hip Exercises: Aerobic   Nustep Lvl 5, 6 min       Knee/Hip Exercises: Standing   Heel Raises Both;15 reps    Heel Raises Limitations wall     Knee Flexion Right;Left;10 reps;Strengthening    Knee Flexion Limitations at wall       Knee/Hip Exercises: Seated   Long Arc Quad Right;Left;10 reps    Long Arc Quad Weight 3 lbs.    Long [employer] Limitations cues for [employer] to [employer] 10 reps;with UE support;2 sets;without UE support                       PT Long Term Goals - 05/15/20 1450  PT LONG TERM GOAL #1   Title I with advanced HEP to include transfer to community based exercise program like [employer]    Time 6    Period Weeks    Status Partially Met   reports that he is planning to join silver sneakers after completing POC- info administered today   Target Date 06/26/20      PT LONG TERM GOAL #2   Title improve TUG =/< 12 sec to improve safety with walking    Time 6    Period Weeks    Status On-going   05/15/20 15.89 sec with [facility]   Target Date 06/26/20      PT LONG TERM GOAL #3   Title improve 6" walking test to =/> 1050' to be in the normal range with good vitals    Time 6    Period Weeks    Status On-going   05/15/20 891 ft with stable vitals   Target Date 06/26/20      PT LONG TERM GOAL #4   Title improve TUG =/< 15 sec to decrease fall risk    Time 6    Period Weeks    Status On-going   05/15/20 15.89 sec with [facility]   Target Date 06/26/20      PT LONG TERM GOAL #5   Title improve 5 rep sit to stand time =/> 14 sec    Time 6    Period Weeks    Status On-going   05/15/20 16.06 sec   Target Date 06/26/20      PT LONG TERM GOAL #6   Title  improve hip and ankle flexibilty to [facility] to allow him to meet the above goals    Time 6    Period Weeks    Status Achieved                 Plan - 06/03/20 1455   Clinical Impression: Joshua [name] reporting he feel a few weeks ago in his garden and had some difficulty getting up.  Pt. reporting he had to "scoot" in to his home on the patio and scraped up his L LE/UE which seemed to be healing well per inspection in session today.  Pt. reporting he has setup a notification system for further falls with his son and did instruct pt. to use SPC next time when he is outside for safety.  Session focused on improving standing balance and LE clearance as pt. ambulating with shortened step-length and poor LE clearance.  Pt. able to perform all activities in session pain free.     Comorbidities arthritis, HTN, hepatitist, GERD, heart murmur    Rehab Potential Excellent    PT Treatment/Interventions Gait training;Stair training;Taping;Patient/family education;Functional mobility training;Moist Heat;Ultrasound;Electrical Stimulation;Cryotherapy;Therapeutic exercise;Balance training;Neuromuscular re-education;Manual techniques;Dry needling;Therapeutic activities;Passive range of motion    PT Next Visit Plan gait training with SPC; ther ex to work on balance and exercise/activity tolerance    Consulted and Agree with Plan of Care Patient           Patient will benefit from skilled therapeutic intervention in order to improve the following deficits and impairments:  Abnormal gait, Difficulty walking, Decreased activity tolerance, Impaired flexibility, Decreased strength, Decreased balance, Increased muscle spasms, Improper body mechanics, Decreased range of motion, Postural dysfunction  Visit Diagnosis: Muscle weakness (generalized)  Other abnormalities of gait and mobility  Other symptoms and signs involving the musculoskeletal system     Problem List Patient Active Problem List   Diagnosis Date  Noted  . Infected abrasion of fifth toe, right, initial encounter 10/31/2019  . Pain due to onychomycosis of toenails of both feet 04/25/2019  . Porokeratosis 04/25/2019  . Gastroesophageal reflux disease 04/22/2018  . Moderate calcific aortic stenosis 08/14/2016  . PVCs (premature ventricular contractions) 08/14/2016  . Hypercholesterolemia 08/14/2016  . DJD (degenerative joint disease) 08/14/2016  . Essential hypertension 07/16/2016    Bess Harvest, PTA 06/03/20 3:29 PM   [facility] [address]  [place], [place], [address] Phone: [phone]   Fax:  [phone]  Name: Joshua [name] MRN: [id number] Date of Birth: 04-29-1936

## 2020-06-05 ENCOUNTER — Ambulatory Visit: Payer: Medicare HMO

## 2020-06-05 ENCOUNTER — Other Ambulatory Visit: Payer: Self-pay

## 2020-06-05 DIAGNOSIS — R29898 Other symptoms and signs involving the musculoskeletal system: Secondary | ICD-10-CM

## 2020-06-05 DIAGNOSIS — M6281 Muscle weakness (generalized): Secondary | ICD-10-CM

## 2020-06-05 DIAGNOSIS — R2689 Other abnormalities of gait and mobility: Secondary | ICD-10-CM | POA: Diagnosis not present

## 2020-06-05 NOTE — Therapy (Signed)
[facility] [address]  [place], [place], [address] Phone: [phone]   Fax:  [phone]  Physical Therapy Treatment  Patient Details  Name: Joshua [name] MRN: [id number] Date of Birth: 06/05/1936 Referring Provider (PT): Dr Riki Sheer   Encounter Date: 06/05/2020   PT End of Session - 06/05/20 1323    Visit Number 9    Number of Visits 19    Date for PT Re-Evaluation 06/26/20    Authorization Type [employer] MCR    PT Start Time 2924    PT Stop Time 1355    PT Time Calculation (min) 38 min    Activity Tolerance Patient tolerated treatment well    Behavior During Therapy [facility] for tasks assessed/performed           Past Medical History:  Diagnosis Date  . Arthritis   . Cataract   . Esophageal stricture   . GERD (gastroesophageal reflux disease)   . Heart murmur   . Hyperlipidemia   . Hypertension   . Liver disease    hepatitis    Past Surgical History:  Procedure Laterality Date  . cataract  2013  . HEMORROIDECTOMY  2001  . HERNIA REPAIR  2005  . TONSILLECTOMY  1948    There were no vitals filed for this visit.   Subjective Assessment - 06/05/20 1321    Subjective Pt. denies soreness after last session.    Pertinent History liver disease, HTN, HLD, GERD    Patient Stated Goals get into a walking or silver sneakers program.    Currently in Pain? No/denies    Multiple Pain Sites No                             [facility] Adult PT Treatment/Exercise - 06/05/20 0001      Neuro Re-ed    Neuro Re-ed Details  Side stepping at counter with light UE support x 3 laps       Knee/Hip Exercises: Stretches   Hip Flexor Stretch Right;Left;1 rep;30 seconds    Hip Flexor Stretch Limitations mod thomas position + strap     Gastroc Stretch Right;Left;30 seconds;3 reps;10 seconds    Gastroc Stretch Limitations into wall       Knee/Hip Exercises: Aerobic   Nustep Lvl 5, 6 min  (UE/LE)      Knee/Hip Exercises: Standing   Heel Raises Both;20 reps    Heel Raises Limitations counter     Hip Flexion Right;Left;10 reps;Knee bent    Hip Flexion Limitations 2# - LE clears to 9" stool     Hip Abduction Right;Left;2 sets;5 reps    Abduction Limitations 2# at counter     Forward Step Up Right;Left;5 reps;Hand Hold: 1;Step Height: 4"    Forward Step Up Limitations 4" stepup at counter     Functional Squat 2 sets;10 reps;3 seconds    Functional Squat Limitations to chair + airex pad                   PT Education - 06/05/20 1408    Education Details HEP update    Person(s) Educated Patient    Methods Explanation;Demonstration;Verbal cues;Handout    Comprehension Verbalized understanding;Returned demonstration;Verbal cues required               PT Long Term Goals - 05/15/20 1450      PT LONG TERM GOAL #1  Title I with advanced HEP to include transfer to community based exercise program like [employer]neakers    Time 6    Period Weeks    Status Partially Met   reports that he is planning to join silver sneakers after completing POC- info administered today   Target Date 06/26/20      PT LONG TERM GOAL #2   Title improve TUG =/< 12 sec to improve safety with walking    Time 6    Period Weeks    Status On-going   05/15/20 15.89 sec with [facility]   Target Date 06/26/20      PT LONG TERM GOAL #3   Title improve 6" walking test to =/> 1050' to be in the normal range with good vitals    Time 6    Period Weeks    Status On-going   05/15/20 891 ft with stable vitals   Target Date 06/26/20      PT LONG TERM GOAL #4   Title improve TUG =/< 15 sec to decrease fall risk    Time 6    Period Weeks    Status On-going   05/15/20 15.89 sec with [facility]   Target Date 06/26/20      PT LONG TERM GOAL #5   Title improve 5 rep sit to stand time =/> 14 sec    Time 6    Period Weeks    Status On-going   05/15/20 16.06 sec   Target Date 06/26/20      PT LONG TERM  GOAL #6   Title improve hip and ankle flexibilty to [facility] to allow him to meet the above goals    Time 6    Period Weeks    Status Achieved                 Plan - 06/05/20 1323    Clinical Impression Statement Joshua [name] denies soreness after last session.  Was able to tolerate progression of balance and LE strengthening activities today without issue.  Did endorse LE fatigue after end of session.  Focused strengthening on improving LE clearances/step-length with gait.  Ended visit with pt. denying pain and updated HEP with handout issued to pt.    Comorbidities arthritis, HTN, hepatitist, GERD, heart murmur    Rehab Potential Excellent    PT Treatment/Interventions Gait training;Stair training;Taping;Patient/family education;Functional mobility training;Moist Heat;Ultrasound;Electrical Stimulation;Cryotherapy;Therapeutic exercise;Balance training;Neuromuscular re-education;Manual techniques;Dry needling;Therapeutic activities;Passive range of motion    PT Next Visit Plan gait training with SPC; ther ex to work on balance and exercise/activity tolerance    Consulted and Agree with Plan of Care Patient           Patient will benefit from skilled therapeutic intervention in order to improve the following deficits and impairments:  Abnormal gait, Difficulty walking, Decreased activity tolerance, Impaired flexibility, Decreased strength, Decreased balance, Increased muscle spasms, Improper body mechanics, Decreased range of motion, Postural dysfunction  Visit Diagnosis: Muscle weakness (generalized)  Other abnormalities of gait and mobility  Other symptoms and signs involving the musculoskeletal system     Problem List Patient Active Problem List   Diagnosis Date Noted  . Infected abrasion of fifth toe, right, initial encounter 10/31/2019  . Pain due to onychomycosis of toenails of both feet 04/25/2019  . Porokeratosis 04/25/2019  . Gastroesophageal reflux disease 04/22/2018  .  Moderate calcific aortic stenosis 08/14/2016  . PVCs (premature ventricular contractions) 08/14/2016  . Hypercholesterolemia 08/14/2016  . DJD (degenerative joint disease) 08/14/2016  .  Essential hypertension 07/16/2016    Bess Harvest, PTA 06/05/20 4:18 PM   [facility] [address]  [place], [place], [address] Phone: [phone]   Fax:  [phone]  Name: Joshua [name] MRN: [id number] Date of Birth: Jul 05, 1936

## 2020-06-10 ENCOUNTER — Encounter: Payer: Self-pay | Admitting: Physical Therapy

## 2020-06-10 ENCOUNTER — Ambulatory Visit: Payer: Medicare HMO | Admitting: Physical Therapy

## 2020-06-10 ENCOUNTER — Other Ambulatory Visit: Payer: Self-pay

## 2020-06-10 DIAGNOSIS — R2689 Other abnormalities of gait and mobility: Secondary | ICD-10-CM

## 2020-06-10 DIAGNOSIS — M6281 Muscle weakness (generalized): Secondary | ICD-10-CM

## 2020-06-10 DIAGNOSIS — R29898 Other symptoms and signs involving the musculoskeletal system: Secondary | ICD-10-CM

## 2020-06-10 NOTE — Therapy (Signed)
[facility] [address]  [place], [place], [address] Phone: [phone]   Fax:  [phone]  Physical Therapy Progress Note  Patient Details  Name: Joshua [name] MRN: [id number] Date of Birth: December 30, 1935 Referring Provider (PT): Dr Riki Sheer   Progress Note Reporting Period 04/15/20 to 06/10/20  See note below for Objective Data and Assessment of Progress/Goals.    Encounter Date: 06/10/2020   PT End of Session - 06/10/20 1453    Visit Number 10    Number of Visits 19    Date for PT Re-Evaluation 06/26/20    Authorization Type [employer] MCR    PT Start Time 0300    PT Stop Time 1443    PT Time Calculation (min) 46 min    Equipment Utilized During Treatment Gait belt    Activity Tolerance Patient tolerated treatment well    Behavior During Therapy WFL for tasks assessed/performed           Past Medical History:  Diagnosis Date  . Arthritis   . Cataract   . Esophageal stricture   . GERD (gastroesophageal reflux disease)   . Heart murmur   . Hyperlipidemia   . Hypertension   . Liver disease    hepatitis    Past Surgical History:  Procedure Laterality Date  . cataract  2013  . HEMORROIDECTOMY  2001  . HERNIA REPAIR  2005  . TONSILLECTOMY  1948    There were no vitals filed for this visit.   Subjective Assessment - 06/10/20 1355    Subjective Things are going "pretty good actually." Has been consistent with HEP. Denies falls since last session. Reports "steps are better" and "legs are stronger" since starting therapy. Hoping to start Silver Sneakers next week. Feels like he will be ready to wrap up with therapy within coming visits.    Pertinent History liver disease, HTN, HLD, GERD    Patient Stated Goals get into a walking or silver sneakers program.    Currently in Pain? No/denies              [facility] PT Assessment - 06/10/20 0001      6 Minute Walk- Baseline   6 Minute Walk-  Baseline yes    BP (mmHg) 128/64    HR (bpm) 82    02 Sat (%RA) 97 %      6 Minute walk- Post Test   BP (mmHg) 140/64    HR (bpm) 96    02 Sat (%RA) 96 %      6 minute walk test results    Aerobic Endurance Distance Walked 917    Endurance additional comments with SPC      Standardized Balance Assessment   Five times sit to stand comments  11.46   11.46 sec with 1 UE support,      Timed Up and Go Test   Normal TUG (seconds) 16.12   with SPC                        [facility] Adult PT Treatment/Exercise - 06/10/20 0001      Knee/Hip Exercises: Aerobic   Nustep Lvl 5, 6 min (UE/LE)      Knee/Hip Exercises: Standing   Other Standing Knee Exercises sidestepping with yellow loop around ankles 4x length of counter      Knee/Hip Exercises: Seated   Other Seated Knee/Hip Exercises sitting row with green TB x10  cue to depress R shoulder   Sit to Sand 5 reps;with UE support;without UE support;3 sets   5x 1 UE, 5x no UE support and CGA/min A, 2x5 sitting foam                  PT Education - 06/10/20 1452    Education Details discussion on objective progress and remaining goals, update to HEP    Person(s) Educated Patient    Methods Explanation;Demonstration;Tactile cues;Verbal cues;Handout    Comprehension Verbalized understanding;Returned demonstration               PT Long Term Goals - 06/10/20 1405      PT LONG TERM GOAL #1   Title I with advanced HEP to include transfer to community based exercise program like Silver Sneakers    Time 6    Period Weeks    Status Partially Met   reporting compliance with HEP and planning to start silver sneakers next week     PT LONG TERM GOAL #2   Title improve TUG =/< 12 sec to improve safety with walking    Time 6    Period Weeks    Status On-going   06/10/20 16.12 sec with SPC     PT LONG TERM GOAL #3   Title improve 6" walking test to =/> 1050' to be in the normal range with good vitals    Time 6     Period Weeks    Status On-going   06/10/20 917 ft with stable vitals     PT LONG TERM GOAL #4   Title improve TUG =/< 15 sec to decrease fall risk    Time 6    Period Weeks    Status On-going   06/10/20 16.12 sec with SPC     PT LONG TERM GOAL #5   Title improve 5 rep sit to stand time =/> 14 sec    Time 6    Period Weeks    Status Partially Met   06/10/20 11.46 sec with 1 UE support     PT LONG TERM GOAL #6   Title improve hip and ankle flexibilty to [facility] to allow him to meet the above goals    Time 6    Period Weeks    Status Achieved                 Plan - 06/10/20 1453    Clinical Impression Statement Patient reporting that his "steps are better" and "legs are stronger" since starting therapy. Hoping to start Silver Sneakers next week. Patient has demonstrated good improvement in 5xSTS time with 1 UE support. Today also able to perform STS transfers without UE support and with slightly elevated seat. Patient has also demonstrated good improvement in 6 minute walk test today while maintaining stable vitals. TUG time is consistent with last measurement, however patient appears much steadier on his feet when ambulating with SPC at this time. Worked on increasing challenge with patient's HEP for max progress. Patient reported understanding of HEP update and without complaints at end of session. Patient is progressing well towards goals.    Comorbidities arthritis, HTN, hepatitist, GERD, heart murmur    Rehab Potential Excellent    PT Treatment/Interventions Gait training;Stair training;Taping;Patient/family education;Functional mobility training;Moist Heat;Ultrasound;Electrical Stimulation;Cryotherapy;Therapeutic exercise;Balance training;Neuromuscular re-education;Manual techniques;Dry needling;Therapeutic activities;Passive range of motion    PT Next Visit Plan STS without UEs; ther ex to work on balance and exercise/activity tolerance    Consulted and Agree  with Plan of Care  Patient           Patient will benefit from skilled therapeutic intervention in order to improve the following deficits and impairments:  Abnormal gait, Difficulty walking, Decreased activity tolerance, Impaired flexibility, Decreased strength, Decreased balance, Increased muscle spasms, Improper body mechanics, Decreased range of motion, Postural dysfunction  Visit Diagnosis: Muscle weakness (generalized)  Other abnormalities of gait and mobility  Other symptoms and signs involving the musculoskeletal system     Problem List Patient Active Problem List   Diagnosis Date Noted  . Infected abrasion of fifth toe, right, initial encounter 10/31/2019  . Pain due to onychomycosis of toenails of both feet 04/25/2019  . Porokeratosis 04/25/2019  . Gastroesophageal reflux disease 04/22/2018  . Moderate calcific aortic stenosis 08/14/2016  . PVCs (premature ventricular contractions) 08/14/2016  . Hypercholesterolemia 08/14/2016  . DJD (degenerative joint disease) 08/14/2016  . Essential hypertension 07/16/2016     Janene Harvey, PT, DPT 06/10/20 2:57 PM   [facility] [address]  [place], [place], [address] Phone: [phone]   Fax:  [phone]  Name: Joshua [name] MRN: [id number] Date of Birth: 06/09/1936

## 2020-06-13 ENCOUNTER — Other Ambulatory Visit: Payer: Self-pay

## 2020-06-13 ENCOUNTER — Ambulatory Visit: Payer: Medicare HMO | Admitting: Physical Therapy

## 2020-06-13 ENCOUNTER — Encounter: Payer: Self-pay | Admitting: Physical Therapy

## 2020-06-13 DIAGNOSIS — R29898 Other symptoms and signs involving the musculoskeletal system: Secondary | ICD-10-CM

## 2020-06-13 DIAGNOSIS — R2689 Other abnormalities of gait and mobility: Secondary | ICD-10-CM

## 2020-06-13 DIAGNOSIS — M6281 Muscle weakness (generalized): Secondary | ICD-10-CM

## 2020-06-13 NOTE — Therapy (Addendum)
[facility] [address]  [place], [place], [address] Phone: [phone]   Fax:  [phone]  Physical Therapy Treatment  Patient Details  Name: Joshua [name] MRN: [id number] Date of Birth: 1936/04/16 Referring Provider (PT): Dr Riki Sheer   Progress Note Reporting Period 06/13/20 to 06/13/20  See note below for Objective Data and Assessment of Progress/Goals.     Encounter Date: 06/13/2020   PT End of Session - 06/13/20 1144    Visit Number 11    Number of Visits 19    Date for PT Re-Evaluation 06/26/20    Authorization Type [employer] MCR    PT Start Time 1100    PT Stop Time 1139    PT Time Calculation (min) 39 min    Activity Tolerance Patient tolerated treatment well    Behavior During Therapy WFL for tasks assessed/performed           Past Medical History:  Diagnosis Date  . Arthritis   . Cataract   . Esophageal stricture   . GERD (gastroesophageal reflux disease)   . Heart murmur   . Hyperlipidemia   . Hypertension   . Liver disease    hepatitis    Past Surgical History:  Procedure Laterality Date  . cataract  2013  . HEMORROIDECTOMY  2001  . HERNIA REPAIR  2005  . TONSILLECTOMY  1948    There were no vitals filed for this visit.   Subjective Assessment - 06/13/20 1059    Subjective Would like to wrap up with therapy today. Forgot his cane this morning. Declines trying a floor transfer today.    Pertinent History liver disease, HTN, HLD, GERD    Patient Stated Goals get into a walking or silver sneakers program.    Currently in Pain? No/denies              [facility] PT Assessment - 06/13/20 0001      Assessment   Medical Diagnosis LE weakness    Referring Provider (PT) Dr Riki Sheer    Onset Date/Surgical Date 04/16/19      AROM   Right Ankle Dorsiflexion 11    Left Ankle Dorsiflexion 18      6 Minute Walk- Baseline   6 Minute Walk- Baseline yes    BP  (mmHg) 128/64    HR (bpm) 82    02 Sat (%RA) 97 %      6 Minute walk- Post Test   BP (mmHg) 140/64    HR (bpm) 96    02 Sat (%RA) 96 %      6 minute walk test results    Aerobic Endurance Distance Walked 917    Endurance additional comments with SPC      Standardized Balance Assessment   Five times sit to stand comments  11.46      Timed Up and Go Test   Normal TUG (seconds) 16.12                         [facility] Adult PT Treatment/Exercise - 06/13/20 0001      Neuro Re-ed    Neuro Re-ed Details  romberg balance 2x30"; 1/2 tandem with each foot 30"   at counter top     Knee/Hip Exercises: Stretches   Active Hamstring Stretch Right;Left;1 rep;30 seconds    Active Hamstring Stretch Limitations sitting + DF      Knee/Hip Exercises: Aerobic  Nustep Lvl 5, 6 min (UE/LE)      Knee/Hip Exercises: Standing   Other Standing Knee Exercises sidestepping with yellow loop around ankles 2x length of counter   able to don/doff loop independently     Knee/Hip Exercises: Seated   Sit to Sand 2 sets;5 reps;with UE support;without UE support   1st set no UE support sit on airex, 2nd set no pad push off                  PT Education - 06/13/20 1143    Education Details update/consolidation of HEP, encouraged patient to follow through with silver sneakers and maintain consistency using SPC for safety    Person(s) Educated Patient    Methods Explanation;Demonstration;Tactile cues;Verbal cues;Handout    Comprehension Verbalized understanding;Returned demonstration               PT Long Term Goals - 06/10/20 1405      PT LONG TERM GOAL #1   Title I with advanced HEP to include transfer to community based exercise program like Silver Sneakers    Time 6    Period Weeks    Status Partially Met   reporting compliance with HEP and planning to start silver sneakers next week     PT LONG TERM GOAL #2   Title improve TUG =/< 12 sec to improve safety with walking     Time 6    Period Weeks    Status On-going   06/10/20 16.12 sec with SPC     PT LONG TERM GOAL #3   Title improve 6" walking test to =/> 1050' to be in the normal range with good vitals    Time 6    Period Weeks    Status On-going   06/10/20 917 ft with stable vitals     PT LONG TERM GOAL #4   Title improve TUG =/< 15 sec to decrease fall risk    Time 6    Period Weeks    Status On-going   06/10/20 16.12 sec with SPC     PT LONG TERM GOAL #5   Title improve 5 rep sit to stand time =/> 14 sec    Time 6    Period Weeks    Status Partially Met   06/10/20 11.46 sec with 1 UE support     PT LONG TERM GOAL #6   Title improve hip and ankle flexibilty to [facility] to allow him to meet the above goals    Time 6    Period Weeks    Status Achieved                 Plan - 06/13/20 1144    Clinical Impression Statement Patient reporting that he would like to wrap up with therapy today. Arrived ambulating into clinic with slightly increased instability d/t forgetting his SPC this AM. D/t patient's hx of fall, suggested review of floor transfer for max understanding and safety, however patient declined. All objective measurements carried over from 06/10/20, demonstrating good progress towards goals. Worked on reviewing HEP for proper form and safety and consolidating exercises for max benefit. Patient was encouraged patient to follow through with Silver Sneakers for fitness and maintain consistency using SPC for safety. Patient reported understanding of all edu provided today and without complaints at end of session. Patient now on 30 day hold per his request.    Comorbidities arthritis, HTN, hepatitist, GERD, heart murmur    Rehab Potential Excellent  PT Treatment/Interventions Gait training;Stair training;Taping;Patient/family education;Functional mobility training;Moist Heat;Ultrasound;Electrical Stimulation;Cryotherapy;Therapeutic exercise;Balance training;Neuromuscular re-education;Manual  techniques;Dry needling;Therapeutic activities;Passive range of motion    PT Next Visit Plan 30 day hold at this time    Consulted and Agree with Plan of Care Patient           Patient will benefit from skilled therapeutic intervention in order to improve the following deficits and impairments:  Abnormal gait, Difficulty walking, Decreased activity tolerance, Impaired flexibility, Decreased strength, Decreased balance, Increased muscle spasms, Improper body mechanics, Decreased range of motion, Postural dysfunction  Visit Diagnosis: Muscle weakness (generalized)  Other abnormalities of gait and mobility  Other symptoms and signs involving the musculoskeletal system     Problem List Patient Active Problem List   Diagnosis Date Noted  . Infected abrasion of fifth toe, right, initial encounter 10/31/2019  . Pain due to onychomycosis of toenails of both feet 04/25/2019  . Porokeratosis 04/25/2019  . Gastroesophageal reflux disease 04/22/2018  . Moderate calcific aortic stenosis 08/14/2016  . PVCs (premature ventricular contractions) 08/14/2016  . Hypercholesterolemia 08/14/2016  . DJD (degenerative joint disease) 08/14/2016  . Essential hypertension 07/16/2016     Janene Harvey, PT, DPT 06/13/20 11:50 AM   [facility] [address]  [place], [place], [address] Phone: [phone]   Fax:  [phone]  Name: Joshua [name] MRN: [id number] Date of Birth: October 30, 1935   PHYSICAL THERAPY DISCHARGE SUMMARY  Visits from Start of Care: 11  Current functional level related to goals / functional outcomes: See above clinical impression; patient did not return during 30 day hold   Remaining deficits: Decreased gait speed; decreased speed with transfers    Education / Equipment: HEP  Plan: Patient agrees to discharge.  Patient goals were partially met. Patient is being discharged due to being pleased with  the current functional level.  ?????     Janene Harvey, PT, DPT 07/15/20 9:19 AM

## 2020-07-04 ENCOUNTER — Telehealth: Payer: Self-pay | Admitting: Family Medicine

## 2020-07-04 NOTE — Telephone Encounter (Signed)
Pt dropped off document to be filled out by provider (1 page Physicians Release Form for Physical Activities- [employer]) Pt would like to be called when document ready to pick up at tel [phone], pt was not sure also if he needs a letter from provider also to take to [facility] stating it is ok for pt to do physical activities. If any questions please contact pt. Document put at front office tray under providers name.

## 2020-07-05 NOTE — Telephone Encounter (Signed)
PCP completed form///called the patient informed form done. Copied original and sent to scan.

## 2020-07-10 ENCOUNTER — Ambulatory Visit: Payer: Medicare HMO | Admitting: Podiatry

## 2020-08-26 DIAGNOSIS — M79672 Pain in left foot: Secondary | ICD-10-CM | POA: Diagnosis not present

## 2020-08-26 DIAGNOSIS — L602 Onychogryphosis: Secondary | ICD-10-CM | POA: Diagnosis not present

## 2020-08-26 DIAGNOSIS — L84 Corns and callosities: Secondary | ICD-10-CM | POA: Diagnosis not present

## 2020-08-26 DIAGNOSIS — M79671 Pain in right foot: Secondary | ICD-10-CM | POA: Diagnosis not present

## 2020-10-02 ENCOUNTER — Ambulatory Visit: Payer: Medicare HMO | Admitting: Podiatry

## 2020-10-09 ENCOUNTER — Encounter: Payer: Self-pay | Admitting: Family Medicine

## 2020-10-09 ENCOUNTER — Other Ambulatory Visit: Payer: Self-pay

## 2020-10-09 ENCOUNTER — Ambulatory Visit (INDEPENDENT_AMBULATORY_CARE_PROVIDER_SITE_OTHER): Payer: Medicare HMO | Admitting: Family Medicine

## 2020-10-09 VITALS — BP 132/78 | HR 86 | Temp 98.3°F | Ht 72.0 in | Wt 182.4 lb

## 2020-10-09 DIAGNOSIS — E7800 Pure hypercholesterolemia, unspecified: Secondary | ICD-10-CM

## 2020-10-09 DIAGNOSIS — I10 Essential (primary) hypertension: Secondary | ICD-10-CM

## 2020-10-09 DIAGNOSIS — R202 Paresthesia of skin: Secondary | ICD-10-CM

## 2020-10-09 DIAGNOSIS — E785 Hyperlipidemia, unspecified: Secondary | ICD-10-CM

## 2020-10-09 MED ORDER — ATORVASTATIN CALCIUM 20 MG PO TABS: ORAL_TABLET | ORAL | 2 refills | Status: DC

## 2020-10-09 NOTE — Progress Notes (Signed)
CC: Htn f/u  Subjective Joshua [name] is a 84 y.o. male who presents for hypertension follow up. He does monitor home blood pressures. Blood pressures ranging from 90-110's/50-70's on average. He is compliant with medications- Norvasc 5 mg/d, lisinopril 20 mg/d.  Patient has these side effects of medication: none He is adhering to a healthy diet overall. Current exercise: PT, lifting, cardio No CP or SOB.  Hyperlipidemia Patient presents for dyslipidemia follow up. Currently being treated with Lipitor 20 mg/d and compliance with treatment thus far has been good. He denies myalgias. Diet/exercise as above.  The patient is not known to have coexisting coronary artery disease.  Paresthesias Pins and needles sensation over post LE's b/l. Started 2-3 mo ago. No inj or change in activity. No balance issues, weakness or other neuro issues a/w this. Has not tried anything at home. Gets better throughout day.    Past Medical History:  Diagnosis Date  . Arthritis   . Cataract   . Esophageal stricture   . GERD (gastroesophageal reflux disease)   . Heart murmur   . Hyperlipidemia   . Hypertension   . Liver disease    hepatitis    Exam BP 132/78 (BP Location: Left Arm, Patient Position: Sitting, Cuff Size: Normal)   Pulse 86   Temp 98.3 F (36.8 C) (Oral)   Ht 6' (1.829 m)   Wt 182 lb 6 oz (82.7 kg)   SpO2 98%   BMI 24.73 kg/m  General:  well developed, well nourished, in no apparent distress Heart: RRR, no bruits, no LE edema Lungs: clear to auscultation, no accessory muscle use Psych: well oriented with normal range of affect and appropriate judgment/insight  Essential hypertension  Hypercholesterolemia - Plan: atorvastatin (LIPITOR) 20 MG tablet  Paresthesia of both lower extremities - Plan: Vitamin B12, Comprehensive metabolic panel, Lipid panel, Hemoglobin A1c, TSH  1. Cont Norvasc 5 mg/d and lisinopril 20 mg/d. OK to stop monitoring BP at home. Counseled on diet  and exercise. 2. Cont statin. Ck labs. 3. Ck labs. Declines further workup if labs neg.  F/u in 6 mo for CPE or prn. The patient voiced understanding and agreement to the plan.  [place], DO 10/09/20  1:42 PM

## 2020-10-10 LAB — COMPREHENSIVE METABOLIC PANEL
ALT: 17 U/L (ref 0–53)
AST: 31 U/L (ref 0–37)
Albumin: 4.4 g/dL (ref 3.5–5.2)
Alkaline Phosphatase: 52 U/L (ref 39–117)
BUN: 17 mg/dL (ref 6–23)
CO2: 29 mEq/L (ref 19–32)
Calcium: 9.3 mg/dL (ref 8.4–10.5)
Chloride: 103 mEq/L (ref 96–112)
Creatinine, Ser: 0.92 mg/dL (ref 0.40–1.50)
GFR: 77.69 mL/min (ref >60.00)
Glucose, Bld: 110 mg/dL — ABNORMAL HIGH (ref 70–99)
Potassium: 4.8 mEq/L (ref 3.5–5.1)
Sodium: 140 mEq/L (ref 135–145)
Total Bilirubin: 1.2 mg/dL (ref 0.2–1.2)
Total Protein: 7.1 g/dL (ref 6.0–8.3)

## 2020-10-10 LAB — LIPID PANEL
Cholesterol: 191 mg/dL (ref 0–200)
HDL: 94.5 mg/dL (ref >39.00)
LDL Cholesterol: 78 mg/dL (ref 0–99)
NonHDL: 96.52
Total CHOL/HDL Ratio: 2
Triglycerides: 93 mg/dL (ref 0.0–149.0)
VLDL: 18.6 mg/dL (ref 0.0–40.0)

## 2020-10-10 LAB — HEMOGLOBIN A1C: Hgb A1c MFr Bld: 4.9 % (ref 4.6–6.5)

## 2020-10-10 LAB — VITAMIN B12: Vitamin B-12: 196 pg/mL — ABNORMAL LOW (ref 211–911)

## 2020-10-10 LAB — TSH: TSH: 1.27 u[IU]/mL (ref 0.35–4.50)

## 2020-10-14 ENCOUNTER — Other Ambulatory Visit: Payer: Self-pay

## 2020-10-14 DIAGNOSIS — E538 Deficiency of other specified B group vitamins: Secondary | ICD-10-CM

## 2020-11-21 DIAGNOSIS — H43812 Vitreous degeneration, left eye: Secondary | ICD-10-CM | POA: Diagnosis not present

## 2020-11-21 DIAGNOSIS — H43391 Other vitreous opacities, right eye: Secondary | ICD-10-CM | POA: Diagnosis not present

## 2020-11-21 DIAGNOSIS — H2511 Age-related nuclear cataract, right eye: Secondary | ICD-10-CM | POA: Diagnosis not present

## 2020-11-21 DIAGNOSIS — H26492 Other secondary cataract, left eye: Secondary | ICD-10-CM | POA: Diagnosis not present

## 2020-11-25 DIAGNOSIS — L84 Corns and callosities: Secondary | ICD-10-CM | POA: Diagnosis not present

## 2020-11-25 DIAGNOSIS — M79671 Pain in right foot: Secondary | ICD-10-CM | POA: Diagnosis not present

## 2020-11-25 DIAGNOSIS — M79672 Pain in left foot: Secondary | ICD-10-CM | POA: Diagnosis not present

## 2020-11-25 DIAGNOSIS — L602 Onychogryphosis: Secondary | ICD-10-CM | POA: Diagnosis not present

## 2020-12-03 ENCOUNTER — Other Ambulatory Visit (INDEPENDENT_AMBULATORY_CARE_PROVIDER_SITE_OTHER): Payer: Medicare HMO

## 2020-12-03 ENCOUNTER — Other Ambulatory Visit: Payer: Self-pay

## 2020-12-03 DIAGNOSIS — E538 Deficiency of other specified B group vitamins: Secondary | ICD-10-CM | POA: Diagnosis not present

## 2020-12-03 LAB — B12 AND FOLATE PANEL
Folate: >23.6 ng/mL (ref >5.9)
Vitamin B-12: 447 pg/mL (ref 211–911)

## 2021-01-19 ENCOUNTER — Other Ambulatory Visit: Payer: Self-pay | Admitting: Family Medicine

## 2021-01-19 DIAGNOSIS — K219 Gastro-esophageal reflux disease without esophagitis: Secondary | ICD-10-CM

## 2021-01-23 ENCOUNTER — Ambulatory Visit: Payer: Medicare HMO

## 2021-01-23 NOTE — Progress Notes (Signed)
   Covid-19 Vaccination Clinic  Name:  Joshua [name]    MRN: [id number] DOB: [birth year]  01/23/2021  [name] was observed post Covid-19 immunization for 15 minutes without incident. He was provided with Vaccine Information Sheet and instruction to access the V-Safe system.   [name] was instructed to call 911 with any severe reactions post vaccine: Marland Kitchen Difficulty breathing  . Swelling of face and throat  . A fast heartbeat  . A bad rash all over body  . Dizziness and weakness

## 2021-02-24 DIAGNOSIS — M79671 Pain in right foot: Secondary | ICD-10-CM | POA: Diagnosis not present

## 2021-02-24 DIAGNOSIS — L84 Corns and callosities: Secondary | ICD-10-CM | POA: Diagnosis not present

## 2021-02-24 DIAGNOSIS — M79672 Pain in left foot: Secondary | ICD-10-CM | POA: Diagnosis not present

## 2021-02-24 DIAGNOSIS — L602 Onychogryphosis: Secondary | ICD-10-CM | POA: Diagnosis not present

## 2021-03-25 DIAGNOSIS — D1801 Hemangioma of skin and subcutaneous tissue: Secondary | ICD-10-CM | POA: Diagnosis not present

## 2021-03-25 DIAGNOSIS — L821 Other seborrheic keratosis: Secondary | ICD-10-CM | POA: Diagnosis not present

## 2021-03-25 DIAGNOSIS — Z85828 Personal history of other malignant neoplasm of skin: Secondary | ICD-10-CM | POA: Diagnosis not present

## 2021-04-09 ENCOUNTER — Ambulatory Visit (INDEPENDENT_AMBULATORY_CARE_PROVIDER_SITE_OTHER): Payer: Medicare HMO | Admitting: Family Medicine

## 2021-04-09 ENCOUNTER — Other Ambulatory Visit: Payer: Self-pay

## 2021-04-09 ENCOUNTER — Encounter: Payer: Self-pay | Admitting: Family Medicine

## 2021-04-09 VITALS — BP 146/82 | HR 99 | Temp 98.6°F | Ht 72.0 in | Wt 183.0 lb

## 2021-04-09 DIAGNOSIS — E538 Deficiency of other specified B group vitamins: Secondary | ICD-10-CM | POA: Diagnosis not present

## 2021-04-09 DIAGNOSIS — E7800 Pure hypercholesterolemia, unspecified: Secondary | ICD-10-CM | POA: Diagnosis not present

## 2021-04-09 DIAGNOSIS — I10 Essential (primary) hypertension: Secondary | ICD-10-CM | POA: Diagnosis not present

## 2021-04-09 LAB — COMPREHENSIVE METABOLIC PANEL
ALT: 17 U/L (ref 0–53)
AST: 33 U/L (ref 0–37)
Albumin: 4.5 g/dL (ref 3.5–5.2)
Alkaline Phosphatase: 58 U/L (ref 39–117)
BUN: 12 mg/dL (ref 6–23)
CO2: 28 mEq/L (ref 19–32)
Calcium: 9.4 mg/dL (ref 8.4–10.5)
Chloride: 103 mEq/L (ref 96–112)
Creatinine, Ser: 0.95 mg/dL (ref 0.40–1.50)
GFR: 74.5 mL/min (ref >60.00)
Glucose, Bld: 103 mg/dL — ABNORMAL HIGH (ref 70–99)
Potassium: 4.6 mEq/L (ref 3.5–5.1)
Sodium: 140 mEq/L (ref 135–145)
Total Bilirubin: 1.1 mg/dL (ref 0.2–1.2)
Total Protein: 7.1 g/dL (ref 6.0–8.3)

## 2021-04-09 LAB — LIPID PANEL
Cholesterol: 183 mg/dL (ref 0–200)
HDL: 89.9 mg/dL (ref >39.00)
LDL Cholesterol: 72 mg/dL (ref 0–99)
NonHDL: 93.37
Total CHOL/HDL Ratio: 2
Triglycerides: 107 mg/dL (ref 0.0–149.0)
VLDL: 21.4 mg/dL (ref 0.0–40.0)

## 2021-04-09 LAB — VITAMIN B12: Vitamin B-12: 474 pg/mL (ref 211–911)

## 2021-04-09 NOTE — Progress Notes (Signed)
Chief Complaint  Patient presents with   Follow-up    Subjective Joshua [name] is a 85 y.o. male who presents for hypertension follow up. He does monitor home blood pressures. Blood pressures ranging from 110-120's/60-70's on average. He is compliant with medication- lisinopril 20 mg/d. Patient has these side effects of medication: none He is adhering to a healthy diet overall. Current exercise: walking, lifting weights, stretching No CP or SOB.  Hyperlipidemia Patient presents for dyslipidemia follow up. Currently being treated with Lipitor 20 mg/d and compliance with treatment thus far has been good. He denies myalgias. Diet/exercise as above.  The patient is not known to have coexisting coronary artery disease.   Past Medical History:  Diagnosis Date   Arthritis    Cataract    Esophageal stricture    GERD (gastroesophageal reflux disease)    Heart murmur    Hyperlipidemia    Hypertension    Liver disease    hepatitis    Exam BP (!) 146/82   Pulse 99   Temp 98.6 F (37 C) (Oral)   Ht 6' (1.829 m)   Wt 183 lb (83 kg)   SpO2 95%   BMI 24.82 kg/m  General:  well developed, well nourished, in no apparent distress Heart: RRR, no bruits, no LE edema Lungs: clear to auscultation, no accessory muscle use Psych: well oriented with normal range of affect and appropriate judgment/insight  Essential hypertension  Hypercholesterolemia - Plan: Lipid panel, Comprehensive metabolic panel  O67 deficiency - Plan: B12  Chronic, stable. Cont lisinopril 10 mg/d. Counseled on diet and exercise. Chronic, stable. Cont Lipitor 20 mg/d.  Ck labs, cont supp for now.  F/u in 6 mo for CPE. The patient voiced understanding and agreement to the plan.  [place], DO 04/09/21  10:32 AM

## 2021-04-09 NOTE — Patient Instructions (Addendum)
Keep the diet clean and stay active.  Give [place] 2-3 business days to get the results of your labs back.   Let me know if you would like another round of physical therapy for stability.   Let us know if you need anything.

## 2021-04-16 ENCOUNTER — Other Ambulatory Visit: Payer: Self-pay | Admitting: Family Medicine

## 2021-05-22 DIAGNOSIS — H35341 Macular cyst, hole, or pseudohole, right eye: Secondary | ICD-10-CM | POA: Diagnosis not present

## 2021-05-22 DIAGNOSIS — H18593 Other hereditary corneal dystrophies, bilateral: Secondary | ICD-10-CM | POA: Diagnosis not present

## 2021-05-22 DIAGNOSIS — H2511 Age-related nuclear cataract, right eye: Secondary | ICD-10-CM | POA: Diagnosis not present

## 2021-05-22 DIAGNOSIS — H35371 Puckering of macula, right eye: Secondary | ICD-10-CM | POA: Diagnosis not present

## 2021-05-26 ENCOUNTER — Other Ambulatory Visit: Payer: Self-pay | Admitting: Family Medicine

## 2021-05-26 DIAGNOSIS — K219 Gastro-esophageal reflux disease without esophagitis: Secondary | ICD-10-CM

## 2021-05-28 DIAGNOSIS — M79671 Pain in right foot: Secondary | ICD-10-CM | POA: Diagnosis not present

## 2021-05-28 DIAGNOSIS — M79672 Pain in left foot: Secondary | ICD-10-CM | POA: Diagnosis not present

## 2021-05-28 DIAGNOSIS — L602 Onychogryphosis: Secondary | ICD-10-CM | POA: Diagnosis not present

## 2021-07-07 ENCOUNTER — Other Ambulatory Visit: Payer: Self-pay | Admitting: Family Medicine

## 2021-07-07 DIAGNOSIS — E7800 Pure hypercholesterolemia, unspecified: Secondary | ICD-10-CM

## 2021-07-10 ENCOUNTER — Ambulatory Visit: Payer: Medicare HMO | Attending: Internal Medicine

## 2021-07-10 DIAGNOSIS — Z23 Encounter for immunization: Secondary | ICD-10-CM

## 2021-07-10 NOTE — Progress Notes (Signed)
   Covid-19 Vaccination Clinic  Name:  Joshua [name]    MRN: [id number] DOB: 1936-08-23  07/10/2021  [name] was observed post Covid-19 immunization for 15 minutes without incident. He was provided with Vaccine Information Sheet and instruction to access the V-Safe system.   [name] was instructed to call 911 with any severe reactions post vaccine: Difficulty breathing  Swelling of face and throat  A fast heartbeat  A bad rash all over body  Dizziness and weakness

## 2021-07-16 ENCOUNTER — Other Ambulatory Visit (HOSPITAL_BASED_OUTPATIENT_CLINIC_OR_DEPARTMENT_OTHER): Payer: Self-pay

## 2021-07-16 MED ORDER — COVID-19MRNA BIVAL VACC PFIZER 30 MCG/0.3ML IM SUSP
INTRAMUSCULAR | 0 refills | Status: DC
  Filled 2021-07-16: qty 0.3, 1d supply, fill #0

## 2021-07-24 ENCOUNTER — Ambulatory Visit (INDEPENDENT_AMBULATORY_CARE_PROVIDER_SITE_OTHER): Payer: Medicare HMO

## 2021-07-24 VITALS — Ht 72.0 in | Wt 183.0 lb

## 2021-07-24 DIAGNOSIS — Z0000 Encounter for general adult medical examination without abnormal findings: Secondary | ICD-10-CM

## 2021-07-24 NOTE — Progress Notes (Signed)
Subjective:   Joshua [name] is a 85 y.o. male who presents for Medicare Annual/Subsequent preventive examination.   I connected with Joshua [name] today by telephone and verified that I am speaking with the correct person using two identifiers. Location patient: home Location provider: work Persons participating in the virtual visit: patient, Marine scientist.    I discussed the limitations, risks, security and privacy concerns of performing an evaluation and management service by telephone and the availability of in person appointments. I also discussed with the patient that there may be a patient responsible charge related to this service. The patient expressed understanding and verbally consented to this telephonic visit.    Interactive audio and video telecommunications were attempted between this provider and patient, however failed, due to patient having technical difficulties OR patient did not have access to video capability.  We continued and completed visit with audio only.  Some vital signs may be absent or patient reported.   Time Spent with patient on telephone encounter: 30 minutes   Review of Systems     Cardiac Risk Factors include: advanced age (>85men, >2 women);hypertension;dyslipidemia;male gender     Objective:    Today's Vitals   07/24/21 1301  Weight: 183 lb (83 kg)  Height: 6' (1.829 m)   Body mass index is 24.82 kg/m.  Advanced Directives 07/24/2021 04/15/2020 04/01/2020 12/28/2017 07/11/2016  Does Patient Have a Medical Advance Directive? Yes Yes Yes No;Yes No  Type of Advance Directive Living will [place];Living will [place];Living will [place];Living will -  Does patient want to make changes to medical advance directive? - - No - Patient declined No - Patient declined -  Copy of [place] in Chart? - Yes - validated most recent copy scanned in chart (See row information) Yes - validated  most recent copy scanned in chart (See row information) No - copy requested -  Would patient like information on creating a medical advance directive? - - - - No - patient declined information    Current Medications (verified) Outpatient Encounter Medications as of 07/24/2021  Medication Sig   atorvastatin (LIPITOR) 20 MG tablet TAKE 1 TABLET  DAILY AT 6 PM.   COVID-19 mRNA bivalent vaccine, Pfizer, injection Inject into the muscle.   Glucosamine-Chondroit-Vit C-Mn (GLUCOSAMINE 1500 COMPLEX PO) Take by mouth every other day.   ketoconazole (NIZORAL) 2 % shampoo APP EXT 3 TIMES A WK   lisinopril (ZESTRIL) 20 MG tablet TAKE 1 TABLET EVERY DAY   Multiple Vitamin (MULTIVITAMIN) tablet Take 1 tablet by mouth daily.   naproxen sodium (ALEVE) 220 MG tablet Take 220 mg by mouth daily as needed.   omeprazole (PRILOSEC) 40 MG capsule TAKE 1 CAPSULE EVERY DAY   No facility-administered encounter medications on file as of 07/24/2021.    Allergies (verified) Patient has no known allergies.   History: Past Medical History:  Diagnosis Date   Arthritis    Cataract    Esophageal stricture    GERD (gastroesophageal reflux disease)    Heart murmur    Hyperlipidemia    Hypertension    Liver disease    hepatitis   Past Surgical History:  Procedure Laterality Date   cataract  2013   HEMORROIDECTOMY  2001   HERNIA REPAIR  2005   TONSILLECTOMY  1948   Family History  Problem Relation Age of Onset   Heart failure Mother 46   Heart failure Father 23   Cancer Maternal  Grandmother    Diabetes Child    Heart Problems Child 16       cardiac arrest   Social History   Socioeconomic History   Marital status: Widowed    Spouse name: Not on file   Number of children: Not on file   Years of education: Not on file   Highest education level: Not on file  Occupational History   Not on file  Tobacco Use   Smoking status: Former    Types: Cigarettes    Quit date: 34    Years since quitting:  57.8   Smokeless tobacco: Never  Substance and Sexual Activity   Alcohol use: Yes    Comment: daily a few cocktails   Drug use: No   Sexual activity: Never  Other Topics Concern   Not on file  Social History Narrative   Epworth Sleepiness Scale = 3 (as of 07/15/2016)   Social Determinants of Health   Financial Resource Strain: Low Risk    Difficulty of Paying Living Expenses: Not hard at all  Food Insecurity: No Food Insecurity   Worried About Charity fundraiser in the Last Year: Never true   [place] in the Last Year: Never true  Transportation Needs: No Transportation Needs   Lack of Transportation (Medical): No   Lack of Transportation (Non-Medical): No  Physical Activity: Insufficiently Active   Days of Exercise per Week: 3 days   Minutes of Exercise per Session: 30 min  Stress: No Stress Concern Present   Feeling of Stress : Not at all  Social Connections: Moderately Integrated   Frequency of Communication with Friends and Family: Once a week   Frequency of Social Gatherings with Friends and Family: Three times a week   Attends Religious Services: More than 4 times per year   Active Member of Clubs or Organizations: Yes   Attends Archivist Meetings: 1 to 4 times per year   Marital Status: Widowed    Tobacco Counseling Counseling given: Not Answered   Clinical Intake:  Pre-visit preparation completed: Yes  Pain : No/denies pain     BMI - recorded: 24.82 Nutritional Status: BMI of 19-24  Normal Nutritional Risks: None Diabetes: No  How often do you need to have someone help you when you read instructions, pamphlets, or other written materials from your doctor or pharmacy?: 1 - Never  Diabetic? No  Interpreter Needed?: No  Information entered by :: Orrin Brigham LPN   Activities of Daily Living In your present state of health, do you have any difficulty performing the following activities: 07/24/2021 04/09/2021  Hearing? N N  Vision?  N Y  Difficulty concentrating or making decisions? N N  Walking or climbing stairs? - N  Dressing or bathing? N N  Doing errands, shopping? N N  Preparing Food and eating ? N -  Using the Toilet? N -  In the past six months, have you accidently leaked urine? N -  Do you have problems with loss of bowel control? N -  Managing your Medications? N -  Managing your Finances? N -  Housekeeping or managing your Housekeeping? N -  Some recent data might be hidden    Patient Care Team: Shelda Pal, DO as PCP - General (Family Medicine)  Indicate any recent Medical Services you may have received from other than [facility] providers in the past year (date may be approximate).     Assessment:   This is  a routine wellness examination for [place].  Hearing/Vision screen Hearing Screening - Comments:: No isues Vision Screening - Comments:: Wears glasses, last exam 3 months ago- [employer]  Dietary issues and exercise activities discussed: Current Exercise Habits: Structured exercise class, Type of exercise: strength training/weights;Other - see comments (Cardio), Time (Minutes): 30, Frequency (Times/Week): 3, Weekly Exercise (Minutes/Week): 90, Intensity: Moderate   Goals Addressed   None    Depression Screen PHQ 2/9 Scores 07/24/2021 04/09/2021 04/01/2020 12/28/2017  PHQ - 2 Score 1 0 1 0    Fall Risk Fall Risk  07/24/2021 04/09/2021 04/01/2020 12/28/2017  Falls in the past year? 0 0 1 No  Number falls in past yr: 0 0 1 -  Injury with Fall? 0 0 0 -  Risk for fall due to : No Fall Risks No Fall Risks Impaired balance/gait;History of fall(s) -  Follow up - Falls evaluation completed Education provided;Falls prevention discussed -    FALL RISK PREVENTION PERTAINING TO THE HOME:  Any stairs in or around the home? No  Home free of loose throw rugs in walkways, pet beds, electrical cords, etc? No  Adequate lighting in your home to reduce risk of falls? No   ASSISTIVE DEVICES  UTILIZED TO PREVENT FALLS:  Life alert? No  Use of a cane, walker or w/c? Yes  Walker st night Grab bars in the bathroom? Yes  Shower chair or bench in shower? Yes  Elevated toilet seat or a handicapped toilet? Yes   TIMED UP AND GO:  Was the test performed? No .  Phone visit    Cognitive Function: Normal cognitive status assessed by this Nurse Health Advisor No abnormalities found.   MMSE - Mini Mental State Exam 12/28/2017  Orientation to time 5  Orientation to Place 5  Registration 3  Attention/ Calculation 5  Recall 3  Language- name 2 objects 2  Language- repeat 1  Language- follow 3 step command 3  Language- read & follow direction 1  Write a sentence 1  Copy design 1  Total score 30        Immunizations Immunization History  Administered Date(s) Administered   Influenza, High Dose Seasonal PF 07/19/2017, 06/19/2020   Influenza-Unspecified 07/20/2015, 07/10/2021   PFIZER Comirnaty(Gray Top)Covid-19 Tri-Sucrose Vaccine 01/23/2021   PFIZER(Purple Top)SARS-COV-2 Vaccination 12/03/2019, 12/25/2019, 07/24/2020   Pfizer Covid-19 Vaccine Bivalent Booster 40yrs & up 07/10/2021   Pneumococcal Polysaccharide-23 04/22/2018   Tdap 04/22/2018    TDAP status: Up to date  Flu Vaccine status: Up to date  Pneumococcal vaccine status: Due, Education has been provided regarding the importance of this vaccine. Advised may receive this vaccine at local pharmacy or Health Dept. Aware to provide a copy of the vaccination record if obtained from local pharmacy or Health Dept. Verbalized acceptance and understanding.  Covid-19 vaccine status: Completed vaccines  Qualifies for Shingles Vaccine? No  , patient states that he has never had chicken pox   Screening Tests Health Maintenance  Topic Date Due   Zoster Vaccines- Shingrix (1 of 2) Never done   TETANUS/TDAP  04/22/2028   INFLUENZA VACCINE  Completed   COVID-19 Vaccine  Completed   HPV VACCINES  Aged Out    Health  Maintenance  Health Maintenance Due  Topic Date Due   Zoster Vaccines- Shingrix (1 of 2) Never done    Colorectal cancer screening: No longer required.   Lung Cancer Screening: (Low Dose CT Chest recommended if Age 45-80 years, 30 pack-year currently smoking OR  have quit w/in 15years.) does not qualify.     Additional Screening:  Hepatitis C Screening: does not qualify  Vision Screening: Recommended annual ophthalmology exams for early detection of glaucoma and other disorders of the eye. Is the patient up to date with their annual eye exam?  Yes  Who is the provider or what is the name of the office in which the patient attends annual eye exams? [place] Screening: Recommended annual dental exams for proper oral hygiene  Community Resource Referral / Chronic Care Management: CRR required this visit?  No   CCM required this visit?  No      Plan:     I have personally reviewed and noted the following in the patient's chart:   Medical and social history Use of alcohol, tobacco or illicit drugs  Current medications and supplements including opioid prescriptions. Patient is not currently taking opioid prescriptions. Functional ability and status Nutritional status Physical activity Advanced directives List of other physicians Hospitalizations, surgeries, and ER visits in previous 12 months Vitals Screenings to include cognitive, depression, and falls Referrals and appointments  In addition, I have reviewed and discussed with patient certain preventive protocols, quality metrics, and best practice recommendations. A written personalized care plan for preventive services as well as general preventive health recommendations were provided to patient.   Due to this being a telephonic visit, the after visit summary with patients personalized plan was offered to patient via mail or my-chart.  Patient would like to access on my-chart.  Loma Messing,  LPN   [id number]   Nurse Health Advisor  Nurse Notes: None

## 2021-07-24 NOTE — Patient Instructions (Signed)
[name] , Thank you for taking time to complete for your Medicare Wellness Visit. I appreciate your ongoing commitment to your health goals. Please review the following plan we discussed and let me know if I can assist you in the future.   Screening recommendations/referrals: Colonoscopy: No longer required Recommended yearly ophthalmology/optometry visit for glaucoma screening and checkup Recommended yearly dental visit for hygiene and checkup  Vaccinations: Influenza vaccine: Up to date Pneumococcal vaccine: discuss Prevnar with PCP Tdap vaccine: Up to date/ Due 04/2028 Shingles vaccine: Per our discussion never had chicken pox   Covid-19: Up to date  Advanced directives: Copy on file  Conditions/risks identified: see problem list  Next appointment: Follow up in one year for your annual wellness visit. 07/28/22 @ 1:40pm  Preventive Care 65 Years and Older, Male Preventive care refers to lifestyle choices and visits with your health care provider that can promote health and wellness. What does preventive care include? A yearly physical exam. This is also called an annual well check. Dental exams once or twice a year. Routine eye exams. Ask your health care provider how often you should have your eyes checked. Personal lifestyle choices, including: Daily care of your teeth and gums. Regular physical activity. Eating a healthy diet. Avoiding tobacco and drug use. Limiting alcohol use. Practicing safe sex. Taking low doses of aspirin every day. Taking vitamin and mineral supplements as recommended by your health care provider. What happens during an annual well check? The services and screenings done by your health care provider during your annual well check will depend on your age, overall health, lifestyle risk factors, and family history of disease. Counseling  Your health care provider may ask you questions about your: Alcohol use. Tobacco use. Drug use. Emotional  well-being. Home and relationship well-being. Sexual activity. Eating habits. History of falls. Memory and ability to understand (cognition). Work and work Statistician. Screening  You may have the following tests or measurements: Height, weight, and BMI. Blood pressure. Lipid and cholesterol levels. These may be checked every 5 years, or more frequently if you are over 77 years old. Skin check. Lung cancer screening. You may have this screening every year starting at age 32 if you have a 30-pack-year history of smoking and currently smoke or have quit within the past 15 years. Fecal occult blood test (FOBT) of the stool. You may have this test every year starting at age 75. Flexible sigmoidoscopy or colonoscopy. You may have a sigmoidoscopy every 5 years or a colonoscopy every 10 years starting at age 21. Prostate cancer screening. Recommendations will vary depending on your family history and other risks. Hepatitis C blood test. Hepatitis B blood test. Sexually transmitted disease (STD) testing. Diabetes screening. This is done by checking your blood sugar (glucose) after you have not eaten for a while (fasting). You may have this done every 1-3 years. Abdominal aortic aneurysm (AAA) screening. You may need this if you are a current or former smoker. Osteoporosis. You may be screened starting at age 36 if you are at high risk. Talk with your health care provider about your test results, treatment options, and if necessary, the need for more tests. Vaccines  Your health care provider may recommend certain vaccines, such as: Influenza vaccine. This is recommended every year. Tetanus, diphtheria, and acellular pertussis (Tdap, Td) vaccine. You may need a Td booster every 10 years. Zoster vaccine. You may need this after age 80. Pneumococcal 13-valent conjugate (PCV13) vaccine. One dose is recommended after  age 35. Pneumococcal polysaccharide (PPSV23) vaccine. One dose is recommended after  age 58. Talk to your health care provider about which screenings and vaccines you need and how often you need them. This information is not intended to replace advice given to you by your health care provider. Make sure you discuss any questions you have with your health care provider. Document Released: 11/01/2015 Document Revised: 06/24/2016 Document Reviewed: 08/06/2015 Elsevier Interactive Patient Education  2017 [employer] Prevention in the Home Falls can cause injuries. They can happen to people of all ages. There are many things you can do to make your home safe and to help prevent falls. What can I do on the outside of my home? Regularly fix the edges of walkways and driveways and fix any cracks. Remove anything that might make you trip as you walk through a door, such as a raised step or threshold. Trim any bushes or trees on the path to your home. Use bright outdoor lighting. Clear any walking paths of anything that might make someone trip, such as rocks or tools. Regularly check to see if handrails are loose or broken. Make sure that both sides of any steps have handrails. Any raised decks and porches should have guardrails on the edges. Have any leaves, snow, or ice cleared regularly. Use sand or salt on walking paths during winter. Clean up any spills in your garage right away. This includes oil or grease spills. What can I do in the bathroom? Use night lights. Install grab bars by the toilet and in the tub and shower. Do not use towel bars as grab bars. Use non-skid mats or decals in the tub or shower. If you need to sit down in the shower, use a plastic, non-slip stool. Keep the floor dry. Clean up any water that spills on the floor as soon as it happens. Remove soap buildup in the tub or shower regularly. Attach bath mats securely with double-sided non-slip rug tape. Do not have throw rugs and other things on the floor that can make you trip. What can I do in the  bedroom? Use night lights. Make sure that you have a light by your bed that is easy to reach. Do not use any sheets or blankets that are too big for your bed. They should not hang down onto the floor. Have a firm chair that has side arms. You can use this for support while you get dressed. Do not have throw rugs and other things on the floor that can make you trip. What can I do in the kitchen? Clean up any spills right away. Avoid walking on wet floors. Keep items that you use a lot in easy-to-reach places. If you need to reach something above you, use a strong step stool that has a grab bar. Keep electrical cords out of the way. Do not use floor polish or wax that makes floors slippery. If you must use wax, use non-skid floor wax. Do not have throw rugs and other things on the floor that can make you trip. What can I do with my stairs? Do not leave any items on the stairs. Make sure that there are handrails on both sides of the stairs and use them. Fix handrails that are broken or loose. Make sure that handrails are as long as the stairways. Check any carpeting to make sure that it is firmly attached to the stairs. Fix any carpet that is loose or worn. Avoid having throw rugs  at the top or bottom of the stairs. If you do have throw rugs, attach them to the floor with carpet tape. Make sure that you have a light switch at the top of the stairs and the bottom of the stairs. If you do not have them, ask someone to add them for you. What else can I do to help prevent falls? Wear shoes that: Do not have high heels. Have rubber bottoms. Are comfortable and fit you well. Are closed at the toe. Do not wear sandals. If you use a stepladder: Make sure that it is fully opened. Do not climb a closed stepladder. Make sure that both sides of the stepladder are locked into place. Ask someone to hold it for you, if possible. Clearly mark and make sure that you can see: Any grab bars or  handrails. First and last steps. Where the edge of each step is. Use tools that help you move around (mobility aids) if they are needed. These include: Canes. Walkers. Scooters. Crutches. Turn on the lights when you go into a dark area. Replace any light bulbs as soon as they burn out. Set up your furniture so you have a clear path. Avoid moving your furniture around. If any of your floors are uneven, fix them. If there are any pets around you, be aware of where they are. Review your medicines with your doctor. Some medicines can make you feel dizzy. This can increase your chance of falling. Ask your doctor what other things that you can do to help prevent falls. This information is not intended to replace advice given to you by your health care provider. Make sure you discuss any questions you have with your health care provider. Document Released: 08/01/2009 Document Revised: 03/12/2016 Document Reviewed: 11/09/2014 Elsevier Interactive Patient Education  2017 [employer].

## 2021-07-26 ENCOUNTER — Encounter: Payer: Self-pay | Admitting: Internal Medicine

## 2021-08-01 ENCOUNTER — Other Ambulatory Visit: Payer: Self-pay

## 2021-08-01 ENCOUNTER — Encounter: Payer: Self-pay | Admitting: Internal Medicine

## 2021-08-01 ENCOUNTER — Ambulatory Visit (INDEPENDENT_AMBULATORY_CARE_PROVIDER_SITE_OTHER): Payer: Medicare HMO | Admitting: Internal Medicine

## 2021-08-01 VITALS — BP 144/82 | HR 86 | Temp 97.8°F | Resp 18 | Ht 72.0 in | Wt 184.1 lb

## 2021-08-01 DIAGNOSIS — R55 Syncope and collapse: Secondary | ICD-10-CM | POA: Diagnosis not present

## 2021-08-01 DIAGNOSIS — I10 Essential (primary) hypertension: Secondary | ICD-10-CM

## 2021-08-01 LAB — CBC WITH DIFFERENTIAL/PLATELET
Absolute Monocytes: 708 cells/uL (ref 200–950)
Basophils Absolute: 49 cells/uL (ref 0–200)
Basophils Relative: 0.8 %
Eosinophils Absolute: 281 cells/uL (ref 15–500)
Eosinophils Relative: 4.6 %
HCT: 39.6 % (ref 38.5–50.0)
Hemoglobin: 13.9 g/dL (ref 13.2–17.1)
Lymphs Abs: 1312 cells/uL (ref 850–3900)
MCH: 34.6 pg — ABNORMAL HIGH (ref 27.0–33.0)
MCHC: 35.1 g/dL (ref 32.0–36.0)
MCV: 98.5 fL (ref 80.0–100.0)
MPV: 10.1 fL (ref 7.5–12.5)
Monocytes Relative: 11.6 %
Neutro Abs: 3752 cells/uL (ref 1500–7800)
Neutrophils Relative %: 61.5 %
Platelets: 126 10*3/uL — ABNORMAL LOW (ref 140–400)
RBC: 4.02 10*6/uL — ABNORMAL LOW (ref 4.20–5.80)
RDW: 11.6 % (ref 11.0–15.0)
Total Lymphocyte: 21.5 %
WBC: 6.1 10*3/uL (ref 3.8–10.8)

## 2021-08-01 NOTE — Progress Notes (Signed)
Subjective:    Patient ID: Joshua [name], male    DOB: [birth year], 85 y.o.   MRN: [id number]  DOS:  08/01/2021 Type of visit - description: Acute Symptoms happened October 8:   he was taking his routine walk around the neighborhood, he felt unsteady and lightheaded. He was about to grab the electrical post but before that happened he fell and lost consciousness. He does not know for how long he was unconscious.  Neighbors helped him, he was not postictal but actually he lose control of his bladder. No bowel incontinence. He denies any injury, was a soft landing, did not have neck pain after the fall. Denies any other episodes and since then he is feeling at baseline.  EMS arrived, they did an EKG, see picture.  Other data from EMS: BP 144/80, CBG 101, heart rate 80, O2 sat 96%.   Review of Systems No recent fever chills No chest pain no difficulty breathing No recent diarrhea, nausea, blood in the stools. Denies any headache, dizziness, diplopia or slurred speech  Past Medical History:  Diagnosis Date   Arthritis    Cataract    Esophageal stricture    GERD (gastroesophageal reflux disease)    Heart murmur    Hyperlipidemia    Hypertension    Liver disease    hepatitis    Past Surgical History:  Procedure Laterality Date   cataract  2013   HEMORROIDECTOMY  2001   [place]  2005   [place]    Allergies as of 08/01/2021   No Known Allergies      Medication List        Accurate as of August 01, 2021 11:59 PM. If you have any questions, ask your nurse or doctor.          STOP taking these medications    Pfizer COVID-19 Vac Bivalent injection Generic drug: COVID-19 mRNA bivalent vaccine Therapist, music) Stopped by: Kathlene November, MD       TAKE these medications    atorvastatin 20 MG tablet Commonly known as: LIPITOR TAKE 1 TABLET  DAILY AT 6 PM.   GLUCOSAMINE 1500 COMPLEX PO Take by mouth every other day.   ketoconazole 2 %  shampoo Commonly known as: NIZORAL APP EXT 3 TIMES A WK   lisinopril 20 MG tablet Commonly known as: ZESTRIL TAKE 1 TABLET EVERY DAY   multivitamin tablet Take 1 tablet by mouth daily.   naproxen sodium 220 MG tablet Commonly known as: ALEVE Take 220 mg by mouth daily as needed.   omeprazole 40 MG capsule Commonly known as: PRILOSEC TAKE 1 CAPSULE EVERY DAY           Objective:   Physical Exam BP (!) 144/82 (BP Location: Left Arm, Patient Position: Sitting, Cuff Size: Small)   Pulse 86   Temp 97.8 F (36.6 C) (Oral)   Resp 18   Ht 6' (1.829 m)   Wt 184 lb 2 oz (83.5 kg)   SpO2 98%   BMI 24.97 kg/m  General:   Well developed, NAD but frail appearing gentleman HEENT:  Normocephalic . Face symmetric, atraumatic Neck: Normal carotid pulses bilaterally Lungs:  CTA B Normal respiratory effort, no intercostal retractions, no accessory muscle use. Heart: RRR, obvious systolic murmur, more noticeable at the right side of the sternum. Lower extremities: Mild periumbilical edema. Skin: Not pale. Not jaundice Neurologic:  alert & oriented X3.  Speech normal, gait: Seems frail, some difficulty transferring, not using a  cane Psych--  Cognition and judgment appear intact.  Cooperative with normal attention span and concentration.  Behavior appropriate. No anxious or depressed appearing.      Assessment     85 year old gentleman, PMH includes high cholesterol, HTN, aortic stenosis, PVCs, DJD, presents  Syncope: As described above, EMS arrived, EKG show NSR and no change from previous EKGs. Syncope not associated with chest pain or palpitation, his Apple Watch did not alert him of any arrhythmia. He did lose control of his bladder and when asked admits that he had a seizure disorder as a child . He has a history of a aortic stenosis, last evaluated via echo on 2017, he has a loud systolic murmur. Needs further evaluation, refer to cardiology for syncope and history of  aortic stenosis. Consider neuro evaluation after cardiology. BMP, CBC Strongly advised to use a cane, he seems frail and prone to falls. HTN: BP slightly elevated, ambulatory BPs are under excellent control in the 120s- 114s/ 60-70. Follow-up with PCP already scheduled in few weeks, recommend to keep the appointment   This visit occurred during the SARS-CoV-2 public health emergency.  Safety protocols were in place, including screening questions prior to the visit, additional usage of staff PPE, and extensive cleaning of exam room while observing appropriate contact time as indicated for disinfecting solutions.

## 2021-08-01 NOTE — Patient Instructions (Addendum)
  Continue checking your blood pressures  We are referring you to cardiology, if you do not hear from them let us know   GO TO THE LAB : Get the blood work     Keep the appointment with Dr. Nani Ravens

## 2021-08-02 LAB — BASIC METABOLIC PANEL
BUN: 16 mg/dL (ref 7–25)
CO2: 27 mmol/L (ref 20–32)
Calcium: 9.5 mg/dL (ref 8.6–10.3)
Chloride: 101 mmol/L (ref 98–110)
Creat: 0.89 mg/dL (ref 0.70–1.22)
Glucose, Bld: 140 mg/dL — ABNORMAL HIGH (ref 65–99)
Potassium: 4.2 mmol/L (ref 3.5–5.3)
Sodium: 138 mmol/L (ref 135–146)

## 2021-08-11 ENCOUNTER — Telehealth: Payer: Self-pay | Admitting: Family Medicine

## 2021-08-11 NOTE — Telephone Encounter (Signed)
Patient wants to know if he needs to come in to get his glucose checked again since it was high per his last labs. Please advice.

## 2021-08-11 NOTE — Telephone Encounter (Signed)
Patient informed of PCP instructions. 

## 2021-08-11 NOTE — Telephone Encounter (Signed)
No, will follow up in Dec at his 6 mo check. Ty.

## 2021-08-27 DIAGNOSIS — M79672 Pain in left foot: Secondary | ICD-10-CM | POA: Diagnosis not present

## 2021-08-27 DIAGNOSIS — L602 Onychogryphosis: Secondary | ICD-10-CM | POA: Diagnosis not present

## 2021-08-27 DIAGNOSIS — M79671 Pain in right foot: Secondary | ICD-10-CM | POA: Diagnosis not present

## 2021-09-05 NOTE — Progress Notes (Deleted)
Referring-Jos Paz, MD Reason for referral-syncope  HPI: 85 year old male for evaluation of syncope at request of Belinda Fisher, MD.  Echocardiogram October 2017 showed normal LV function, grade 1 diastolic dysfunction, moderate aortic stenosis with mean gradient 28 mmHg.  Renal Dopplers October 2017 showed no renal artery stenosis and no aneurysm.  Previously seen by Dr. Sallyanne Kuster but not since 2017.  Current Outpatient Medications  Medication Sig Dispense Refill   atorvastatin (LIPITOR) 20 MG tablet TAKE 1 TABLET  DAILY AT 6 PM. 90 tablet 1   Glucosamine-Chondroit-Vit C-Mn (GLUCOSAMINE 1500 COMPLEX PO) Take by mouth every other day.     ketoconazole (NIZORAL) 2 % shampoo APP EXT 3 TIMES A WK  6   lisinopril (ZESTRIL) 20 MG tablet TAKE 1 TABLET EVERY DAY 90 tablet 2   Multiple Vitamin (MULTIVITAMIN) tablet Take 1 tablet by mouth daily.     naproxen sodium (ALEVE) 220 MG tablet Take 220 mg by mouth daily as needed.     omeprazole (PRILOSEC) 40 MG capsule TAKE 1 CAPSULE EVERY DAY 90 capsule 1   No current facility-administered medications for this visit.    No Known Allergies   Past Medical History:  Diagnosis Date   Arthritis    Cataract    Esophageal stricture    GERD (gastroesophageal reflux disease)    Heart murmur    Hyperlipidemia    Hypertension    Liver disease    hepatitis    Past Surgical History:  Procedure Laterality Date   cataract  2013   HEMORROIDECTOMY  2001   HERNIA REPAIR  2005   TONSILLECTOMY  1948    Social History   Socioeconomic History   Marital status: Widowed    Spouse name: Not on file   Number of children: Not on file   Years of education: Not on file   Highest education level: Not on file  Occupational History   Not on file  Tobacco Use   Smoking status: Former    Types: Cigarettes    Quit date: 1965    Years since quitting: 57.9   Smokeless tobacco: Never  Substance and Sexual Activity   Alcohol use: Yes    Comment: daily a few  cocktails   Drug use: No   Sexual activity: Never  Other Topics Concern   Not on file  Social History Narrative   Epworth Sleepiness Scale = 3 (as of 07/15/2016)   Social Determinants of Health   Financial Resource Strain: Low Risk    Difficulty of Paying Living Expenses: Not hard at all  Food Insecurity: No Food Insecurity   Worried About Charity fundraiser in the Last Year: Never true   [place] in the Last Year: Never true  Transportation Needs: No Transportation Needs   Lack of Transportation (Medical): No   Lack of Transportation (Non-Medical): No  Physical Activity: Insufficiently Active   Days of Exercise per Week: 3 days   Minutes of Exercise per Session: 30 min  Stress: No Stress Concern Present   Feeling of Stress : Not at all  Social Connections: Moderately Integrated   Frequency of Communication with Friends and Family: Once a week   Frequency of Social Gatherings with Friends and Family: Three times a week   Attends Religious Services: More than 4 times per year   Active Member of Clubs or Organizations: Yes   Attends Archivist Meetings: 1 to 4 times per year   Marital Status:  Widowed  Human resources officer Violence: Not At Risk   Fear of Current or Ex-Partner: No   Emotionally Abused: No   Physically Abused: No   Sexually Abused: No    Family History  Problem Relation Age of Onset   Heart failure Mother 63   Heart failure Father 28   Cancer Maternal Grandmother    Diabetes Child    Heart Problems Child 49       cardiac arrest    ROS: no fevers or chills, productive cough, hemoptysis, dysphasia, odynophagia, melena, hematochezia, dysuria, hematuria, rash, seizure activity, orthopnea, PND, pedal edema, claudication. Remaining systems are negative.  Physical Exam:   There were no vitals taken for this visit.  General:  Well developed/well nourished in NAD Skin warm/dry Patient not depressed No peripheral  clubbing Back-normal HEENT-normal/normal eyelids Neck supple/normal carotid upstroke bilaterally; no bruits; no JVD; no thyromegaly chest - CTA/ normal expansion CV - RRR/normal S1 and S2; no murmurs, rubs or gallops;  PMI nondisplaced Abdomen -NT/ND, no HSM, no mass, + bowel sounds, no bruit 2+ femoral pulses, no bruits Ext-no edema, chords, 2+ DP Neuro-grossly nonfocal  ECG -August 01, 2021-normal sinus rhythm, left axis deviation, cannot rule out septal infarct.  Personally reviewed  A/P  1 syncope-  2 aortic stenosis-  3 hypertension-  4 hyperlipidemia-  Kirk Ruths, MD

## 2021-09-16 ENCOUNTER — Ambulatory Visit: Payer: Medicare HMO | Admitting: Cardiology

## 2021-09-24 NOTE — Progress Notes (Signed)
Cardiology Office Note:   Date:  09/25/2021  NAME:  Joshua [name]    MRN: [id number] DOB:  December 29, 1935   PCP:  Shelda Pal, DO  Cardiologist:  None  Electrophysiologist:  None   Referring MD: Colon Branch, MD   Chief Complaint  Patient presents with   Loss of Consciousness   History of Present Illness:   Joshua [name] is a 85 y.o. male with a hx of aortic stenosis, HLD, HTN who is being seen today for the evaluation of aortic stenosis at the request of Colon Branch, MD. he reports he had a syncopal episode on 07/26/2021.  Apparently he was walking in the neighborhood and felt lightheaded.  He reports he passed out.  He denies any seizure-like activity.  He did not urinate himself or defecate on himself.  No history of seizure disorder.  He describes shows possible old anteroseptal infarct.  He does have a history of moderate aortic stenosis in 2017.  Apparently he was unaware he was supposed to follow-up on this.  He does have a very loud murmur with absent S2.  It is late peaking.  I suspect he has severe aortic stenosis.  He also reports lower extremity edema.  This has been going on for few months.  He needs a repeat echocardiogram and what I suspect will be aortic valve replacement.  His blood pressure is 159/89.  He is on lisinopril 20 mg daily.  He takes amlodipine as needed.  He describes no palpitations.  He does have a history of PVCs.  Most recent lipids show an LDL of 72.  No history of heart attack or stroke.  He is not diabetic.  No strong family history of CAD.  He reports his father died of congestive heart failure related to rheumatic valvular heart disease.  He reports he is not that active.  He reports his wife died 3 years ago.  He lives in [place] as one of his sons lives here.  He denies any chest pain or trouble breathing with his current level of activity.  He currently lives at home and has help cleaning the house.  He is able to feed himself and bathe and  dress himself.  He reports he is just not active due to lack of motivation.  He reports he does not drink enough water.  He reports he may drink 2 to 3 glasses/day.  He describes nothing unusual about the day in question.  He reports he had eaten that morning.  He had his usual amount of water.  He has not had any recurrent symptoms of syncope.  Problem List Moderate AS -Vmax 3.4 m/s, MG 28 mmHG, DI 0.28 (2017) 2. HTN 3. HLD -T chol 183, HDL 89, LDL 72, TG 107 4. PVCs  Past Medical History: Past Medical History:  Diagnosis Date   Arthritis    Cataract    Esophageal stricture    GERD (gastroesophageal reflux disease)    Heart murmur    Hyperlipidemia    Hypertension    Liver disease    hepatitis    Past Surgical History: Past Surgical History:  Procedure Laterality Date   cataract  2013   HEMORROIDECTOMY  2001   HERNIA REPAIR  2005   TONSILLECTOMY  1948    Current Medications: Current Meds  Medication Sig   atorvastatin (LIPITOR) 20 MG tablet TAKE 1 TABLET  DAILY AT 6 PM.   Glucosamine-Chondroit-Vit C-Mn (GLUCOSAMINE 1500 COMPLEX PO) Take  by mouth every other day.   ketoconazole (NIZORAL) 2 % shampoo APP EXT 3 TIMES A WK   lisinopril (ZESTRIL) 20 MG tablet TAKE 1 TABLET EVERY DAY   Multiple Vitamin (MULTIVITAMIN) tablet Take 1 tablet by mouth daily.   naproxen sodium (ALEVE) 220 MG tablet Take 220 mg by mouth daily as needed.   omeprazole (PRILOSEC) 40 MG capsule TAKE 1 CAPSULE EVERY DAY     Allergies:    Patient has no known allergies.   Social History: Social History   Socioeconomic History   Marital status: Widowed    Spouse name: Not on file   Number of children: 3   Years of education: Not on file   Highest education level: Not on file  Occupational History   Occupation: retired Estate agent for [employer]  Tobacco Use   Smoking status: Former    Types: Cigarettes    Quit date: 1965    Years since quitting: 57.9   Smokeless tobacco: Never  Substance and  Sexual Activity   Alcohol use: Yes    Comment: daily a few cocktails   Drug use: No   Sexual activity: Never  Other Topics Concern   Not on file  Social History Narrative   Epworth Sleepiness Scale = 3 (as of 07/15/2016)   Social Determinants of Health   Financial Resource Strain: Low Risk    Difficulty of Paying Living Expenses: Not hard at all  Food Insecurity: No Food Insecurity   Worried About Charity fundraiser in the Last Year: Never true   [place] in the Last Year: Never true  Transportation Needs: No Transportation Needs   Lack of Transportation (Medical): No   Lack of Transportation (Non-Medical): No  Physical Activity: Insufficiently Active   Days of Exercise per Week: 3 days   Minutes of Exercise per Session: 30 min  Stress: No Stress Concern Present   Feeling of Stress : Not at all  Social Connections: Moderately Integrated   Frequency of Communication with Friends and Family: Once a week   Frequency of Social Gatherings with Friends and Family: Three times a week   Attends Religious Services: More than 4 times per year   Active Member of Clubs or Organizations: Yes   Attends Archivist Meetings: 1 to 4 times per year   Marital Status: Widowed     Family History: The patient's family history includes Cancer in his maternal grandmother; Diabetes in his child; Heart Problems (age of onset: 48) in his child; Heart failure (age of onset: 60) in his father; Heart failure (age of onset: 60) in his mother.  ROS:   All other ROS reviewed and negative. Pertinent positives noted in the HPI.     EKGs/Labs/Other Studies Reviewed:   The following studies were personally reviewed by me today:  EKG:  EKG is ordered today.  The ekg ordered today demonstrates normal sinus rhythm heart rate 97, old anteroseptal infarct, and was personally reviewed by me.   TTE 07/30/2016 - Left ventricle: The cavity size was normal. Wall thickness was    normal. Systolic  function was normal. The estimated ejection    fraction was in the range of 50% to 55%. Wall motion was normal;    there were no regional wall motion abnormalities. Doppler    parameters are consistent with abnormal left ventricular    relaxation (grade 1 diastolic dysfunction).  - Aortic valve: Trileaflet; moderately thickened, moderately    calcified leaflets.  Valve mobility was restricted. There was    moderate stenosis. Peak velocity (S): 339 cm/s. Mean gradient    (S): 28 mm Hg.   Recent Labs: 10/09/2020: TSH 1.27 04/09/2021: ALT 17 08/01/2021: BUN 16; Creat 0.89; Hemoglobin 13.9; Platelets 126; Potassium 4.2; Sodium 138   Recent Lipid Panel    Component Value Date/Time   CHOL 183 04/09/2021 1033   TRIG 107.0 04/09/2021 1033   HDL 89.90 04/09/2021 1033   CHOLHDL 2 04/09/2021 1033   VLDL 21.4 04/09/2021 1033   LDLCALC 72 04/09/2021 1033    Physical Exam:   VS:  BP (!) 159/89   Ht 5' 11.5" (1.816 m)   SpO2 97%   BMI 25.32 kg/m    Wt Readings from Last 3 Encounters:  08/01/21 184 lb 2 oz (83.5 kg)  07/24/21 183 lb (83 kg)  04/09/21 183 lb (83 kg)    General: Well nourished, well developed, in no acute distress Head: Atraumatic, normal size  Eyes: PEERLA, EOMI  Neck: Supple, no JVD Endocrine: No thryomegaly Cardiac: Normal S1, S2; RRR; 3 out of 6 harsh systolic ejection murmur that is late peaking, absent S2 Lungs: Clear to auscultation bilaterally, no wheezing, rhonchi or rales  Abd: Soft, nontender, no hepatomegaly  Ext: 1+ pitting edema Musculoskeletal: No deformities, BUE and BLE strength normal and equal Skin: Warm and dry, no rashes   Neuro: Alert and oriented to person, place, time, and situation, CNII-XII grossly intact, no focal deficits  Psych: Normal mood and affect   ASSESSMENT:   BRENIN HEIDELBERGER is a 85 y.o. male who presents for the following: 1. Syncope and collapse   2. Nonrheumatic aortic valve stenosis   3. Primary hypertension     PLAN:    1. Syncope and collapse 2. Nonrheumatic aortic valve stenosis 3. Primary hypertension -He describes a syncopal event several weeks ago.  No further episodes.  He is a loud murmur with absent S2.  I suspect he has severe aortic stenosis.  I believe he also has symptomatic severe aortic stenosis.  He does describe lower extremity edema that is well controlled with leg elevation.  I believe he could be developing a cardiomyopathy from his aortic stenosis.  He had aortic stenosis that was described as moderate in 2017.  Clearly he has progressed and his murmur is consistent with this. -We will obtain an echocardiogram urgently.  I would also like to check a BNP TSH, CBC and BMP.  I believe he ultimately will be heading towards transcatheter aortic valve replacement if he is a candidate given that he now likely has symptoms from his aortic stenosis.  I will set him up to see the structural heart team after we obtain his echo. -Part of his work-up will proceed with left and right heart catheterization as well as CT scan.  We will get full evaluation of his syncopal episode this way. -In the meantime I recommended against driving.  He should remain active but should be cautious of any symptoms he could have. -I will put a follow-up appointment for him to see me in 6 months however suspect he will be seeing structural heart team sooner than later.  Disposition: Return in about 6 months (around 03/26/2022).  Medication Adjustments/Labs and Tests Ordered: Current medicines are reviewed at length with the patient today.  Concerns regarding medicines are outlined above.  Orders Placed This Encounter  Procedures   Brain natriuretic peptide   TSH   CBC   EKG 12-Lead  ECHOCARDIOGRAM COMPLETE   No orders of the defined types were placed in this encounter.   Patient Instructions  Medication Instructions:  The current medical regimen is effective;  continue present plan and medications.  *If you need a  refill on your cardiac medications before your next appointment, please call your pharmacy*   Lab Work: BNP, TSH, CBC  If you have labs (blood work) drawn today and your tests are completely normal, you will receive your results only by: [place] (if you have MyChart) OR A paper copy in the mail If you have any lab test that is abnormal or we need to change your treatment, we will call you to review the results.   Testing/Procedures:  Echocardiogram - Your physician has requested that you have an echocardiogram. Echocardiography is a painless test that uses sound waves to create images of your heart. It provides your doctor with information about the size and shape of your heart and how well your heart's chambers and valves are working. This procedure takes approximately one hour. There are no restrictions for this procedure. This will be performed at either our [facility] location - [address], [place] location [facility] 2nd floor.    Follow-Up: At [facility], you and your health needs are our priority.  As part of our continuing mission to provide you with exceptional heart care, we have created designated Provider Care Teams.  These Care Teams include your primary Cardiologist (physician) and Advanced Practice Providers (APPs -  Physician Assistants and Nurse Practitioners) who all work together to provide you with the care you need, when you need it.  We recommend signing up for the patient portal called "MyChart".  Sign up information is provided on this After Visit Summary.  MyChart is used to connect with patients for Virtual Visits (Telemedicine).  Patients are able to view lab/test results, encounter notes, upcoming appointments, etc.  Non-urgent messages can be sent to your provider as well.   To learn more about what you can do with MyChart, go to [email or web address].    Your next appointment:   6 month(s)  The format for  your next appointment:   In Person  Provider:   Eleonore Chiquito, MD     Signed, Addison Naegeli. Audie Box, MD, [place]  [address] [place], [place] [address] [phone]  09/25/2021 12:07 PM

## 2021-09-25 ENCOUNTER — Encounter: Payer: Self-pay | Admitting: Cardiovascular Disease

## 2021-09-25 ENCOUNTER — Other Ambulatory Visit: Payer: Self-pay

## 2021-09-25 ENCOUNTER — Ambulatory Visit: Payer: Medicare HMO | Admitting: Cardiovascular Disease

## 2021-09-25 VITALS — BP 159/89 | Ht 71.5 in

## 2021-09-25 DIAGNOSIS — I350 Nonrheumatic aortic (valve) stenosis: Secondary | ICD-10-CM | POA: Diagnosis not present

## 2021-09-25 DIAGNOSIS — I10 Essential (primary) hypertension: Secondary | ICD-10-CM | POA: Diagnosis not present

## 2021-09-25 DIAGNOSIS — R55 Syncope and collapse: Secondary | ICD-10-CM | POA: Diagnosis not present

## 2021-09-25 NOTE — Patient Instructions (Signed)
Medication Instructions:  The current medical regimen is effective;  continue present plan and medications.  *If you need a refill on your cardiac medications before your next appointment, please call your pharmacy*   Lab Work: BNP, TSH, CBC  If you have labs (blood work) drawn today and your tests are completely normal, you will receive your results only by: [place] (if you have MyChart) OR A paper copy in the mail If you have any lab test that is abnormal or we need to change your treatment, we will call you to review the results.   Testing/Procedures:  Echocardiogram - Your physician has requested that you have an echocardiogram. Echocardiography is a painless test that uses sound waves to create images of your heart. It provides your doctor with information about the size and shape of your heart and how well your heart's chambers and valves are working. This procedure takes approximately one hour. There are no restrictions for this procedure. This will be performed at either our [facility] location - [address], [place] location [facility] 2nd floor.    Follow-Up: At [facility], you and your health needs are our priority.  As part of our continuing mission to provide you with exceptional heart care, we have created designated Provider Care Teams.  These Care Teams include your primary Cardiologist (physician) and Advanced Practice Providers (APPs -  Physician Assistants and Nurse Practitioners) who all work together to provide you with the care you need, when you need it.  We recommend signing up for the patient portal called "MyChart".  Sign up information is provided on this After Visit Summary.  MyChart is used to connect with patients for Virtual Visits (Telemedicine).  Patients are able to view lab/test results, encounter notes, upcoming appointments, etc.  Non-urgent messages can be sent to your provider as well.   To learn more  about what you can do with MyChart, go to [email or web address].    Your next appointment:   6 month(s)  The format for your next appointment:   In Person  Provider:   Eleonore Chiquito, MD

## 2021-09-26 LAB — BRAIN NATRIURETIC PEPTIDE: BNP: 33 pg/mL (ref 0.0–100.0)

## 2021-09-26 LAB — CBC
Hematocrit: 41.3 % (ref 37.5–51.0)
Hemoglobin: 14.9 g/dL (ref 13.0–17.7)
MCH: 34.5 pg — ABNORMAL HIGH (ref 26.6–33.0)
MCHC: 36.1 g/dL — ABNORMAL HIGH (ref 31.5–35.7)
MCV: 96 fL (ref 79–97)
Platelets: 149 10*3/uL — ABNORMAL LOW (ref 150–450)
RBC: 4.32 x10E6/uL (ref 4.14–5.80)
RDW: 11.1 % — ABNORMAL LOW (ref 11.6–15.4)
WBC: 6.4 10*3/uL (ref 3.4–10.8)

## 2021-09-26 LAB — TSH: TSH: 1.15 u[IU]/mL (ref 0.450–4.500)

## 2021-10-08 ENCOUNTER — Other Ambulatory Visit: Payer: Self-pay

## 2021-10-08 ENCOUNTER — Ambulatory Visit (INDEPENDENT_AMBULATORY_CARE_PROVIDER_SITE_OTHER): Payer: Medicare HMO

## 2021-10-08 DIAGNOSIS — I350 Nonrheumatic aortic (valve) stenosis: Secondary | ICD-10-CM | POA: Diagnosis not present

## 2021-10-08 LAB — ECHOCARDIOGRAM COMPLETE
AR max vel: 0.79 cm2
AV Area VTI: 0.9 cm2
AV Area mean vel: 1.02 cm2
AV Mean grad: 58 mmHg
AV Peak grad: 95.6 mmHg
Ao pk vel: 4.89 m/s
Area-P 1/2: 2.42 cm2
Calc EF: 73.1 %
MV VTI: 3.15 cm2
S' Lateral: 2.26 cm
Single Plane A2C EF: 70.4 %
Single Plane A4C EF: 75.2 %

## 2021-10-10 ENCOUNTER — Encounter: Payer: Medicare HMO | Admitting: Family Medicine

## 2021-10-24 ENCOUNTER — Encounter: Payer: Self-pay | Admitting: Family Medicine

## 2021-10-24 ENCOUNTER — Ambulatory Visit (INDEPENDENT_AMBULATORY_CARE_PROVIDER_SITE_OTHER): Payer: Medicare HMO | Admitting: Family Medicine

## 2021-10-24 VITALS — BP 140/81 | HR 81 | Temp 98.3°F | Ht 71.5 in | Wt 185.5 lb

## 2021-10-24 DIAGNOSIS — Z0000 Encounter for general adult medical examination without abnormal findings: Secondary | ICD-10-CM

## 2021-10-24 DIAGNOSIS — E7800 Pure hypercholesterolemia, unspecified: Secondary | ICD-10-CM

## 2021-10-24 DIAGNOSIS — Z23 Encounter for immunization: Secondary | ICD-10-CM | POA: Diagnosis not present

## 2021-10-24 NOTE — Progress Notes (Signed)
Chief Complaint  Patient presents with   Annual Exam    Discuss problems with his heart    Well Male Joshua [name] is here for a complete physical.   His last physical was >1 year ago.  Current diet: in general, a "healthy" diet.   Current exercise: none Weight trend: stable Fatigue out of ordinary? No. Seat belt? Yes.   Advanced directive? No  Health maintenance Tetanus- Yes Hep C- Yes Pneumonia vaccine- Due for PCV20  Past Medical History:  Diagnosis Date   Arthritis    Cataract    Esophageal stricture    GERD (gastroesophageal reflux disease)    Heart murmur    Hyperlipidemia    Hypertension    Liver disease    hepatitis     Past Surgical History:  Procedure Laterality Date   cataract  2013   HEMORROIDECTOMY  2001   [place]  2005   TONSILLECTOMY  1948    Medications  Current Outpatient Medications on File Prior to Visit  Medication Sig Dispense Refill   atorvastatin (LIPITOR) 20 MG tablet TAKE 1 TABLET  DAILY AT 6 PM. 90 tablet 1   Glucosamine-Chondroit-Vit C-Mn (GLUCOSAMINE 1500 COMPLEX PO) Take by mouth every other day.     ketoconazole (NIZORAL) 2 % shampoo APP EXT 3 TIMES A WK  6   lisinopril (ZESTRIL) 20 MG tablet TAKE 1 TABLET EVERY DAY 90 tablet 2   Multiple Vitamin (MULTIVITAMIN) tablet Take 1 tablet by mouth daily.     naproxen sodium (ALEVE) 220 MG tablet Take 220 mg by mouth daily as needed.     omeprazole (PRILOSEC) 40 MG capsule TAKE 1 CAPSULE EVERY DAY 90 capsule 1    Allergies No Known Allergies  Family History Family History  Problem Relation Age of Onset   Heart failure Mother 11   Heart failure Father 36   Cancer Maternal Grandmother    Diabetes Child    Heart Problems Child 73       cardiac arrest    Review of Systems: Constitutional:  no fevers Eye:  vision worsening, follows w ophtho Ears:  No changes in hearing Nose/Mouth/Throat:  no complaints of nasal congestion, no sore throat Cardiovascular: no chest  pain Respiratory:  No shortness of breath Gastrointestinal:  No change in bowel habits GU:  No frequency Integumentary:  no abnormal skin lesions reported Neurologic:  no headaches Endocrine:  denies unexplained weight changes  Exam BP 140/81    Pulse 81    Temp 98.3 F (36.8 C) (Oral)    Ht 5' 11.5" (1.816 m)    Wt 185 lb 8 oz (84.1 kg)    SpO2 98%    BMI 25.51 kg/m  General:  well developed, well nourished, in no apparent distress Skin:  no significant moles, warts, or growths Head:  no masses, lesions, or tenderness Eyes:  pupils equal and round, sclera anicteric without injection Ears:  canals without lesions, TMs shiny without retraction, no obvious effusion, no erythema Nose:  nares patent, septum midline, mucosa normal Throat/Pharynx:  lips and gingiva without lesion; tongue and uvula midline; non-inflamed pharynx; no exudates or postnasal drainage Lungs:  clear to auscultation, breath sounds equal bilaterally, no respiratory distress Cardio:  regular rate and rhythm, 3/6 SEM heard loudest at aortic listening post, 2+ pitting LE edema  Rectal: Deferred GI: BS+, S, NT, ND, no masses or organomegaly Musculoskeletal:  symmetrical muscle groups noted without atrophy or deformity Neuro:  gait normal; deep tendon  reflexes normal and symmetric Psych: well oriented with normal range of affect and appropriate judgment/insight  Assessment and Plan  Well adult exam  Hypercholesterolemia - Plan: Comprehensive metabolic panel, Lipid panel  Need for vaccination against Streptococcus pneumoniae - Plan: Pneumococcal conjugate vaccine 20-valent (Prevnar 9)   Well 86 y.o. male. Counseled on diet and exercise. Other orders as above. PCV20 today.  Follow up in 6 mo.  The patient voiced understanding and agreement to the plan.  [place], DO 10/24/21 3:57 PM

## 2021-10-24 NOTE — Patient Instructions (Signed)
Give [place] 2-3 business days to get the results of your labs back.   Please get me a copy of your advanced directive at your convenience.  Keep the diet clean and stay active.  Let us know if you need anything.

## 2021-10-25 LAB — LIPID PANEL
Cholesterol: 195 mg/dL (ref <200)
HDL: 101 mg/dL (ref >=40)
LDL Cholesterol (Calc): 78 mg/dL (calc)
Non-HDL Cholesterol (Calc): 94 mg/dL (calc) (ref <130)
Total CHOL/HDL Ratio: 1.9 (calc) (ref <5.0)
Triglycerides: 76 mg/dL (ref <150)

## 2021-10-25 LAB — COMPREHENSIVE METABOLIC PANEL
AG Ratio: 1.7 (calc) (ref 1.0–2.5)
ALT: 26 U/L (ref 9–46)
AST: 50 U/L — ABNORMAL HIGH (ref 10–35)
Albumin: 4.5 g/dL (ref 3.6–5.1)
Alkaline phosphatase (APISO): 68 U/L (ref 35–144)
BUN: 19 mg/dL (ref 7–25)
CO2: 26 mmol/L (ref 20–32)
Calcium: 9.5 mg/dL (ref 8.6–10.3)
Chloride: 101 mmol/L (ref 98–110)
Creat: 1.02 mg/dL (ref 0.70–1.22)
Globulin: 2.6 g/dL (calc) (ref 1.9–3.7)
Glucose, Bld: 89 mg/dL (ref 65–99)
Potassium: 5 mmol/L (ref 3.5–5.3)
Sodium: 139 mmol/L (ref 135–146)
Total Bilirubin: 1.2 mg/dL (ref 0.2–1.2)
Total Protein: 7.1 g/dL (ref 6.1–8.1)

## 2021-10-27 ENCOUNTER — Other Ambulatory Visit: Payer: Self-pay | Admitting: Family Medicine

## 2021-10-27 DIAGNOSIS — R7989 Other specified abnormal findings of blood chemistry: Secondary | ICD-10-CM

## 2021-10-28 ENCOUNTER — Encounter: Payer: Self-pay | Admitting: Cardiovascular Disease

## 2021-10-28 NOTE — Progress Notes (Signed)
[facility] Consult Note  Reason for Visit: New patient-Severe aortic stenosis  History of Present Illness: 86 yo male with history of PVCs, arthritis, GERD, HTN, hyperlipidemia and aortic stenosis who is here today as a new consult, referred by Dr. Audie Box, for further discussion regarding his aortic stenosis and possible TAVR. He had a syncopal episode in October 2022. He is known to have moderate aortic stenosis. He was seen by Dr. Audie Box on 09/25/21 and an echo was arranged. Echo 10/08/21 with LVEF=60-65%. The aortic valve leaflets are thickened and calcified with restricted leaflet motion. Mean gradient 58 mmHg, peak gradient 95.6 mmHg, AVA 0.79 cm2, DI 0.20. This is consistent with severe aortic stenosis.   He tells me today that he passed out on October 8,2022. Since then he has had some dizziness over the past few weeks. He "blacked out" last week while sitting in the chair before bed. No chest pain or dyspnea. Mild LE edema that resolves at night. He lives in [place], [place] alone. His son lives in [place]. His daughter in law is with him today. 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 He sees a dentist regularly and has no active dental issues.   Primary Care Physician: Shelda Pal, DO Primary Cardiologist: Audie Box Referring Cardiologist: Audie Box  Past Medical History:  Diagnosis Date   Aortic stenosis    Arthritis    Cataract    Esophageal stricture    GERD (gastroesophageal reflux disease)    Heart murmur    Hyperlipidemia    Hypertension    Liver disease    hepatitis    Past Surgical History:  Procedure Laterality Date   cataract  2013   HEMORROIDECTOMY  2001   HERNIA REPAIR  2005   TONSILLECTOMY  1948    Current Outpatient Medications  Medication Sig Dispense Refill   aspirin EC 81 MG tablet Take 1 tablet (81 mg total) by mouth daily. Swallow whole. 90 tablet 3   atorvastatin (LIPITOR) 20 MG tablet TAKE 1 TABLET  DAILY  AT 6 PM. 90 tablet 1   Glucosamine-Chondroit-Vit C-Mn (GLUCOSAMINE 1500 COMPLEX PO) Take by mouth every other day.     ketoconazole (NIZORAL) 2 % shampoo APP EXT 3 TIMES A WK  6   lisinopril (ZESTRIL) 20 MG tablet TAKE 1 TABLET EVERY DAY 90 tablet 2   Multiple Vitamin (MULTIVITAMIN) tablet Take 1 tablet by mouth daily.     naproxen sodium (ALEVE) 220 MG tablet Take 220 mg by mouth daily as needed.     omeprazole (PRILOSEC) 40 MG capsule TAKE 1 CAPSULE EVERY DAY 90 capsule 1   No current facility-administered medications for this visit.    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  Tobacco Use   Smoking status: Former    Types: Cigarettes    Quit date: 1965    Years since quitting: 58.0   Smokeless tobacco: Never  Substance and Sexual Activity   Alcohol use: Yes    Comment: daily a few cocktails   Drug use: No   Sexual activity: Never  Other Topics Concern   Not on file  Social History Narrative   Epworth Sleepiness Scale = 3 (as of 07/15/2016)   Social Determinants of Health   Financial Resource Strain: Low Risk    Difficulty of Paying Living Expenses: Not hard at all  Food Insecurity: No Food Insecurity   Worried About Charity fundraiser in the Last Year: Never true   Ran Out of Food in the Last Year: Never true  Transportation Needs: No Transportation Needs   Lack of Transportation (Medical): No   Lack of Transportation (Non-Medical): No  Physical Activity: Insufficiently Active   Days of Exercise per Week: 3 days   Minutes of Exercise per Session: 30 min  Stress: No Stress Concern Present   Feeling of Stress : Not at all  Social Connections: Moderately Integrated   Frequency of  Communication with Friends and Family: Once a week   Frequency of Social Gatherings with Friends and Family: Three times a week   Attends Religious Services: More than 4 times per year   Active Member of Clubs or Organizations: Yes   Attends Archivist Meetings: 1 to 4 times per year   Marital Status: Widowed  Human resources officer Violence: Not At Risk   Fear of Current or Ex-Partner: No   Emotionally Abused: No   Physically Abused: No   Sexually Abused: No    Family History  Problem Relation Age of Onset   Heart failure Mother 78   Heart failure Father 7       Scarlet fever as child-Valve disease   Cancer Maternal Grandmother    Diabetes Child    Heart Problems Child 78       cardiac arrest    Review of Systems:  As stated in the HPI and otherwise negative.   BP 120/70    Pulse 96    Ht 5' 11.5" (1.816 m)    Wt 187 lb (84.8 kg)    SpO2 97%    BMI 25.72 kg/m   Physical Examination: General: Well developed, well nourished, NAD  HEENT: OP clear, mucus membranes moist  SKIN: warm, dry. No rashes. Neuro: No focal deficits  Musculoskeletal: Muscle strength 5/5 all ext  Psychiatric: Mood and affect normal  Neck: No JVD, no carotid bruits, no thyromegaly, no lymphadenopathy.  Lungs:Clear bilaterally, no wheezes, rhonci, crackles Cardiovascular: Regular rate and rhythm. Loud, harsh, late peaking systolic murmur.  Abdomen:Soft. Bowel sounds present. Non-tender.  Extremities: No lower extremity edema. Pulses are 2 + in the bilateral DP/PT.  EKG:  EKG is ordered today. The ekg ordered today demonstrates Sinus  Echo 10/08/21:  1. The aortic valve was not well visualized. Aortic valve regurgitation  is not visualized. Severe aortic valve stenosis. Aortic valve area, by VTI  measures 0.90 cm. Aortic valve mean gradient measures 58.0 mmHg. Aortic  valve Vmax measures 4.89 m/s.   2. Left ventricular ejection fraction, by estimation, is 60 to 65%. The  left ventricle has  normal function. The left ventricle has no regional  wall motion abnormalities. There is mild concentric left ventricular  hypertrophy. Left ventricular diastolic  parameters are consistent with Grade I diastolic dysfunction (impaired  relaxation). There is a small intracavitary < 20 mmHg with Valsalva.   3. Right ventricular systolic function is normal. The right ventricular  size is normal. There is normal pulmonary artery systolic pressure.   4. Left atrial size was mildly dilated.   5. The mitral valve is degenerative. No evidence of mitral valve  regurgitation.   Comparison(s):  EF 50%, AS mean gradient 28 mmHG, peak 46 mmHg. Interval  increase to severe aortic stenosis.   FINDINGS   Left Ventricle: Left ventricular ejection fraction, by estimation, is 60  to 65%. The left ventricle has normal function. The left ventricle has no  regional wall motion abnormalities. The left ventricular internal cavity  size was normal in size. There is   mild concentric left ventricular hypertrophy. Left ventricular diastolic  parameters are consistent with Grade I diastolic dysfunction (impaired  relaxation).   Right Ventricle: The right ventricular size is normal. No increase in  right ventricular wall thickness. Right ventricular systolic function is  normal. There is normal pulmonary artery systolic pressure. The tricuspid  regurgitant velocity is 2.82 m/s, and   with an assumed right atrial pressure of 3 mmHg, the estimated right  ventricular systolic pressure is 92.3 mmHg.   Left Atrium: Left atrial size was mildly dilated.   Right Atrium: Right atrial size was normal in size.   Pericardium: There is no evidence of pericardial effusion.   Mitral Valve: The mitral valve is degenerative in appearance. No evidence  of mitral valve regurgitation. MV peak gradient, 6.8 mmHg. The mean mitral  valve gradient is 3.0 mmHg.   Tricuspid Valve: The tricuspid valve is normal in structure.  Tricuspid  valve regurgitation is trivial. No evidence of tricuspid stenosis.   Aortic Valve: The aortic valve was not well visualized. Aortic valve  regurgitation is not visualized. Severe aortic stenosis is present. Aortic  valve mean gradient measures 58.0 mmHg. Aortic valve peak gradient  measures 95.6 mmHg. Aortic valve area, by  VTI measures 0.90 cm.   Pulmonic Valve: The pulmonic valve was not well visualized. Pulmonic valve  regurgitation is not visualized. No evidence of pulmonic stenosis.   Aorta: The aortic root and ascending aorta are structurally normal, with  no evidence of dilitation.   IAS/Shunts: The atrial septum is grossly normal.      LEFT VENTRICLE  PLAX 2D  LVIDd:         3.92 cm     Diastology  LVIDs:         2.26 cm     LV e' medial:    7.51 cm/s  LV PW:         1.17 cm     LV E/e' medial:  10.6  LV IVS:        1.08 cm     LV e' lateral:   8.70 cm/s  LVOT diam:     2.10 cm     LV E/e' lateral: 9.2  LV SV:         69  LV SV Index:   34  LVOT Area:     3.46 cm     LV Volumes (MOD)  LV vol d, MOD A2C: 46.9 ml  LV vol d, MOD A4C: 58.8 ml  LV vol s, MOD A2C: 13.9 ml  LV vol s, MOD A4C: 14.6 ml  LV SV MOD A2C:     33.0 ml  LV SV MOD A4C:     58.8 ml  LV SV MOD BP:      39.0 ml   RIGHT VENTRICLE  RV S prime:     13.80 cm/s  TAPSE (M-mode): 2.7 cm   LEFT ATRIUM             Index        RIGHT ATRIUM  Index  LA diam:        3.50 cm 1.71 cm/m   RA Area:     13.90 cm  LA Vol (A2C):   62.1 ml 30.35 ml/m  RA Volume:   33.40 ml  16.32 ml/m  LA Vol (A4C):   67.8 ml 33.13 ml/m  LA Biplane Vol: 70.6 ml 34.50 ml/m   AORTIC VALVE                     PULMONIC VALVE  AV Area (Vmax):    0.79 cm      PV Vmax:       1.64 m/s  AV Area (Vmean):   1.02 cm      PV Peak grad:  10.7 mmHg  AV Area (VTI):     0.90 cm  AV Vmax:           489.00 cm/s  AV Vmean:          242.250 cm/s  AV VTI:            0.767 m  AV Peak Grad:      95.6 mmHg  AV Mean  Grad:      58.0 mmHg  LVOT Vmax:         111.00 cm/s  LVOT Vmean:        71.400 cm/s  LVOT VTI:          0.200 m  LVOT/AV VTI ratio: 0.26     AORTA  Ao Root diam: 3.20 cm  Ao Asc diam:  3.10 cm   MITRAL VALVE                TRICUSPID VALVE  MV Area (PHT): 2.42 cm     TR Peak grad:   31.8 mmHg  MV Area VTI:   3.15 cm     TR Vmax:        282.00 cm/s  MV Peak grad:  6.8 mmHg  MV Mean grad:  3.0 mmHg     SHUNTS  MV Vmax:       1.30 m/s     Systemic VTI:  0.20 m  MV Vmean:      77.6 cm/s    Systemic Diam: 2.10 cm  MV Decel Time: 313 msec  MV E velocity: 79.80 cm/s  MV A velocity: 134.00 cm/s  MV E/A ratio:  0.60   Recent Labs: 09/25/2021: BNP 33.0; Hemoglobin 14.9; Platelets 149; TSH 1.150 10/24/2021: ALT 26; BUN 19; Creat 1.02; Potassium 5.0; Sodium 139    Wt Readings from Last 3 Encounters:  10/29/21 187 lb (84.8 kg)  10/24/21 185 lb 8 oz (84.1 kg)  08/01/21 184 lb 2 oz (83.5 kg)     Other studies Reviewed: Additional studies/ records that were reviewed today include: office notes, echo images, EKG Review of the above records demonstrates: severe AS   Assessment and Plan:   1. Severe Aortic Valve Stenosis: He has critical aortic valve stenosis. I have personally reviewed the echo images. The aortic valve is thickened, calcified with limited leaflet mobility. I think he would benefit from AVR. Given advanced age, he is not a good candidate for conventional AVR by surgical approach. I think he may be a good candidate for TAVR.   I have reviewed the natural history of aortic stenosis with the patient and their family members  who are present today. We have discussed the limitations of medical therapy and the poor prognosis associated with  symptomatic aortic stenosis. We have reviewed potential treatment options, including palliative medical therapy, conventional surgical aortic valve replacement, and transcatheter aortic valve replacement. We discussed treatment options in the  context of the patient's specific comorbid medical conditions.   He would like to proceed with planning for TAVR. I will arrange a right and left heart catheterization at [facility] 12/04/21 at 10:30am. Risks and benefits of the cath procedure and the valve procedure are reviewed with the patient. After the cath, he will have a cardiac CT, CTA of the chest/abdomen and pelvis and will then be referred to see one of the CT surgeons on our TAVR team. With recent syncope, we will expedite his planning and try to do his TAVR within the next few weeks.   2. Syncope: Likely related to his aortic stenosis. I have asked him not to drive.      Current medicines are reviewed at length with the patient today.  The patient does not have concerns regarding medicines.  The following changes have been made:  no change  Labs/ tests ordered today include:   Orders Placed This Encounter  Procedures   CBC   Basic metabolic panel   EKG [id number]     Disposition:   F/U with the valve team.    Signed, Lauree Chandler, MD 10/29/2021 1:25 PM    [facility] [address], [place], [place]  [address] Phone: [phone]; Fax: [phone]

## 2021-10-28 NOTE — H&P (View-Only) (Signed)
[facility] Consult Note  Reason for Visit: New patient-Severe aortic stenosis  History of Present Illness: 86 yo male with history of PVCs, arthritis, GERD, HTN, hyperlipidemia and aortic stenosis who is here today as a new consult, referred by Dr. Audie Box, for further discussion regarding his aortic stenosis and possible TAVR. He had a syncopal episode in October 2022. He is known to have moderate aortic stenosis. He was seen by Dr. Audie Box on 09/25/21 and an echo was arranged. Echo 10/08/21 with LVEF=60-65%. The aortic valve leaflets are thickened and calcified with restricted leaflet motion. Mean gradient 58 mmHg, peak gradient 95.6 mmHg, AVA 0.79 cm2, DI 0.20. This is consistent with severe aortic stenosis.   He tells me today that he passed out on October 8,2022. Since then he has had some dizziness over the past few weeks. He "blacked out" last week while sitting in the chair before bed. No chest pain or dyspnea. Mild LE edema that resolves at night. He lives in [place], [place] alone. His son lives in [place]. His daughter in law is with him today. 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 He sees a dentist regularly and has no active dental issues.   Primary Care Physician: Shelda Pal, DO Primary Cardiologist: Audie Box Referring Cardiologist: Audie Box  Past Medical History:  Diagnosis Date   Aortic stenosis    Arthritis    Cataract    Esophageal stricture    GERD (gastroesophageal reflux disease)    Heart murmur    Hyperlipidemia    Hypertension    Liver disease    hepatitis    Past Surgical History:  Procedure Laterality Date   cataract  2013   HEMORROIDECTOMY  2001   HERNIA REPAIR  2005   TONSILLECTOMY  1948    Current Outpatient Medications  Medication Sig Dispense Refill   aspirin EC 81 MG tablet Take 1 tablet (81 mg total) by mouth daily. Swallow whole. 90 tablet 3   atorvastatin (LIPITOR) 20 MG tablet TAKE 1 TABLET  DAILY  AT 6 PM. 90 tablet 1   Glucosamine-Chondroit-Vit C-Mn (GLUCOSAMINE 1500 COMPLEX PO) Take by mouth every other day.     ketoconazole (NIZORAL) 2 % shampoo APP EXT 3 TIMES A WK  6   lisinopril (ZESTRIL) 20 MG tablet TAKE 1 TABLET EVERY DAY 90 tablet 2   Multiple Vitamin (MULTIVITAMIN) tablet Take 1 tablet by mouth daily.     naproxen sodium (ALEVE) 220 MG tablet Take 220 mg by mouth daily as needed.     omeprazole (PRILOSEC) 40 MG capsule TAKE 1 CAPSULE EVERY DAY 90 capsule 1   No current facility-administered medications for this visit.    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  Tobacco Use   Smoking status: Former    Types: Cigarettes    Quit date: 1965    Years since quitting: 58.0   Smokeless tobacco: Never  Substance and Sexual Activity   Alcohol use: Yes    Comment: daily a few cocktails   Drug use: No   Sexual activity: Never  Other Topics Concern   Not on file  Social History Narrative   Epworth Sleepiness Scale = 3 (as of 07/15/2016)   Social Determinants of Health   Financial Resource Strain: Low Risk    Difficulty of Paying Living Expenses: Not hard at all  Food Insecurity: No Food Insecurity   Worried About Charity fundraiser in the Last Year: Never true   Ran Out of Food in the Last Year: Never true  Transportation Needs: No Transportation Needs   Lack of Transportation (Medical): No   Lack of Transportation (Non-Medical): No  Physical Activity: Insufficiently Active   Days of Exercise per Week: 3 days   Minutes of Exercise per Session: 30 min  Stress: No Stress Concern Present   Feeling of Stress : Not at all  Social Connections: Moderately Integrated   Frequency of  Communication with Friends and Family: Once a week   Frequency of Social Gatherings with Friends and Family: Three times a week   Attends Religious Services: More than 4 times per year   Active Member of Clubs or Organizations: Yes   Attends Archivist Meetings: 1 to 4 times per year   Marital Status: Widowed  Human resources officer Violence: Not At Risk   Fear of Current or Ex-Partner: No   Emotionally Abused: No   Physically Abused: No   Sexually Abused: No    Family History  Problem Relation Age of Onset   Heart failure Mother 27   Heart failure Father 38       [place] fever as child-Valve disease   Cancer Maternal Grandmother    Diabetes Child    Heart Problems Child 49       cardiac arrest    Review of Systems:  As stated in the HPI and otherwise negative.   BP 120/70    Pulse 96    Ht 5' 11.5" (1.816 m)    Wt 187 lb (84.8 kg)    SpO2 97%    BMI 25.72 kg/m   Physical Examination: General: Well developed, well nourished, NAD  HEENT: OP clear, mucus membranes moist  SKIN: warm, dry. No rashes. Neuro: No focal deficits  Musculoskeletal: Muscle strength 5/5 all ext  Psychiatric: Mood and affect normal  Neck: No JVD, no carotid bruits, no thyromegaly, no lymphadenopathy.  Lungs:Clear bilaterally, no wheezes, rhonci, crackles Cardiovascular: Regular rate and rhythm. Loud, harsh, late peaking systolic murmur.  Abdomen:Soft. Bowel sounds present. Non-tender.  Extremities: No lower extremity edema. Pulses are 2 + in the bilateral DP/PT.  EKG:  EKG is ordered today. The ekg ordered today demonstrates Sinus  Echo 10/08/21:  1. The aortic valve was not well visualized. Aortic valve regurgitation  is not visualized. Severe aortic valve stenosis. Aortic valve area, by VTI  measures 0.90 cm. Aortic valve mean gradient measures 58.0 mmHg. Aortic  valve Vmax measures 4.89 m/s.   2. Left ventricular ejection fraction, by estimation, is 60 to 65%. The  left ventricle has  normal function. The left ventricle has no regional  wall motion abnormalities. There is mild concentric left ventricular  hypertrophy. Left ventricular diastolic  parameters are consistent with Grade I diastolic dysfunction (impaired  relaxation). There is a small intracavitary < 20 mmHg with Valsalva.   3. Right ventricular systolic function is normal. The right ventricular  size is normal. There is normal pulmonary artery systolic pressure.   4. Left atrial size was mildly dilated.   5. The mitral valve is degenerative. No evidence of mitral valve  regurgitation.   Comparison(s):  EF 50%, AS mean gradient 28 mmHG, peak 46 mmHg. Interval  increase to severe aortic stenosis.   FINDINGS   Left Ventricle: Left ventricular ejection fraction, by estimation, is 60  to 65%. The left ventricle has normal function. The left ventricle has no  regional wall motion abnormalities. The left ventricular internal cavity  size was normal in size. There is   mild concentric left ventricular hypertrophy. Left ventricular diastolic  parameters are consistent with Grade I diastolic dysfunction (impaired  relaxation).   Right Ventricle: The right ventricular size is normal. No increase in  right ventricular wall thickness. Right ventricular systolic function is  normal. There is normal pulmonary artery systolic pressure. The tricuspid  regurgitant velocity is 2.82 m/s, and   with an assumed right atrial pressure of 3 mmHg, the estimated right  ventricular systolic pressure is 27.0 mmHg.   Left Atrium: Left atrial size was mildly dilated.   Right Atrium: Right atrial size was normal in size.   Pericardium: There is no evidence of pericardial effusion.   Mitral Valve: The mitral valve is degenerative in appearance. No evidence  of mitral valve regurgitation. MV peak gradient, 6.8 mmHg. The mean mitral  valve gradient is 3.0 mmHg.   Tricuspid Valve: The tricuspid valve is normal in structure.  Tricuspid  valve regurgitation is trivial. No evidence of tricuspid stenosis.   Aortic Valve: The aortic valve was not well visualized. Aortic valve  regurgitation is not visualized. Severe aortic stenosis is present. Aortic  valve mean gradient measures 58.0 mmHg. Aortic valve peak gradient  measures 95.6 mmHg. Aortic valve area, by  VTI measures 0.90 cm.   Pulmonic Valve: The pulmonic valve was not well visualized. Pulmonic valve  regurgitation is not visualized. No evidence of pulmonic stenosis.   Aorta: The aortic root and ascending aorta are structurally normal, with  no evidence of dilitation.   IAS/Shunts: The atrial septum is grossly normal.      LEFT VENTRICLE  PLAX 2D  LVIDd:         3.92 cm     Diastology  LVIDs:         2.26 cm     LV e' medial:    7.51 cm/s  LV PW:         1.17 cm     LV E/e' medial:  10.6  LV IVS:        1.08 cm     LV e' lateral:   8.70 cm/s  LVOT diam:     2.10 cm     LV E/e' lateral: 9.2  LV SV:         69  LV SV Index:   34  LVOT Area:     3.46 cm     LV Volumes (MOD)  LV vol d, MOD A2C: 46.9 ml  LV vol d, MOD A4C: 58.8 ml  LV vol s, MOD A2C: 13.9 ml  LV vol s, MOD A4C: 14.6 ml  LV SV MOD A2C:     33.0 ml  LV SV MOD A4C:     58.8 ml  LV SV MOD BP:      39.0 ml   RIGHT VENTRICLE  RV S prime:     13.80 cm/s  TAPSE (M-mode): 2.7 cm   LEFT ATRIUM             Index        RIGHT ATRIUM  Index  LA diam:        3.50 cm 1.71 cm/m   RA Area:     13.90 cm  LA Vol (A2C):   62.1 ml 30.35 ml/m  RA Volume:   33.40 ml  16.32 ml/m  LA Vol (A4C):   67.8 ml 33.13 ml/m  LA Biplane Vol: 70.6 ml 34.50 ml/m   AORTIC VALVE                     PULMONIC VALVE  AV Area (Vmax):    0.79 cm      PV Vmax:       1.64 m/s  AV Area (Vmean):   1.02 cm      PV Peak grad:  10.7 mmHg  AV Area (VTI):     0.90 cm  AV Vmax:           489.00 cm/s  AV Vmean:          242.250 cm/s  AV VTI:            0.767 m  AV Peak Grad:      95.6 mmHg  AV Mean  Grad:      58.0 mmHg  LVOT Vmax:         111.00 cm/s  LVOT Vmean:        71.400 cm/s  LVOT VTI:          0.200 m  LVOT/AV VTI ratio: 0.26     AORTA  Ao Root diam: 3.20 cm  Ao Asc diam:  3.10 cm   MITRAL VALVE                TRICUSPID VALVE  MV Area (PHT): 2.42 cm     TR Peak grad:   31.8 mmHg  MV Area VTI:   3.15 cm     TR Vmax:        282.00 cm/s  MV Peak grad:  6.8 mmHg  MV Mean grad:  3.0 mmHg     SHUNTS  MV Vmax:       1.30 m/s     Systemic VTI:  0.20 m  MV Vmean:      77.6 cm/s    Systemic Diam: 2.10 cm  MV Decel Time: 313 msec  MV E velocity: 79.80 cm/s  MV A velocity: 134.00 cm/s  MV E/A ratio:  0.60   Recent Labs: 09/25/2021: BNP 33.0; Hemoglobin 14.9; Platelets 149; TSH 1.150 10/24/2021: ALT 26; BUN 19; Creat 1.02; Potassium 5.0; Sodium 139    Wt Readings from Last 3 Encounters:  10/29/21 187 lb (84.8 kg)  10/24/21 185 lb 8 oz (84.1 kg)  08/01/21 184 lb 2 oz (83.5 kg)     Other studies Reviewed: Additional studies/ records that were reviewed today include: office notes, echo images, EKG Review of the above records demonstrates: severe AS   Assessment and Plan:   1. Severe Aortic Valve Stenosis: He has critical aortic valve stenosis. I have personally reviewed the echo images. The aortic valve is thickened, calcified with limited leaflet mobility. I think he would benefit from AVR. Given advanced age, he is not a good candidate for conventional AVR by surgical approach. I think he may be a good candidate for TAVR.   I have reviewed the natural history of aortic stenosis with the patient and their family members  who are present today. We have discussed the limitations of medical therapy and the poor prognosis associated with  symptomatic aortic stenosis. We have reviewed potential treatment options, including palliative medical therapy, conventional surgical aortic valve replacement, and transcatheter aortic valve replacement. We discussed treatment options in the  context of the patient's specific comorbid medical conditions.   He would like to proceed with planning for TAVR. I will arrange a right and left heart catheterization at [facility] 12/04/21 at 10:30am. Risks and benefits of the cath procedure and the valve procedure are reviewed with the patient. After the cath, he will have a cardiac CT, CTA of the chest/abdomen and pelvis and will then be referred to see one of the CT surgeons on our TAVR team. With recent syncope, we will expedite his planning and try to do his TAVR within the next few weeks.   2. Syncope: Likely related to his aortic stenosis. I have asked him not to drive.      Current medicines are reviewed at length with the patient today.  The patient does not have concerns regarding medicines.  The following changes have been made:  no change  Labs/ tests ordered today include:   Orders Placed This Encounter  Procedures   CBC   Basic metabolic panel   EKG 52-CEYE     Disposition:   F/U with the valve team.    Signed, Lauree Chandler, MD 10/29/2021 1:25 PM    [facility] [place], [place], [place]  [address] Phone: [phone]; Fax: [phone]

## 2021-10-29 ENCOUNTER — Other Ambulatory Visit: Payer: Self-pay

## 2021-10-29 ENCOUNTER — Ambulatory Visit (INDEPENDENT_AMBULATORY_CARE_PROVIDER_SITE_OTHER): Payer: Medicare HMO | Admitting: Cardiovascular Disease

## 2021-10-29 ENCOUNTER — Encounter: Payer: Self-pay | Admitting: Cardiovascular Disease

## 2021-10-29 VITALS — BP 120/70 | HR 96 | Ht 71.5 in | Wt 187.0 lb

## 2021-10-29 DIAGNOSIS — I350 Nonrheumatic aortic (valve) stenosis: Secondary | ICD-10-CM

## 2021-10-29 DIAGNOSIS — R55 Syncope and collapse: Secondary | ICD-10-CM

## 2021-10-29 LAB — BASIC METABOLIC PANEL
BUN/Creatinine Ratio: 13 (ref 10–24)
BUN: 15 mg/dL (ref 8–27)
CO2: 24 mmol/L (ref 20–29)
Calcium: 9.5 mg/dL (ref 8.6–10.2)
Chloride: 99 mmol/L (ref 96–106)
Creatinine, Ser: 1.17 mg/dL (ref 0.76–1.27)
Glucose: 133 mg/dL — ABNORMAL HIGH (ref 70–99)
Potassium: 4.9 mmol/L (ref 3.5–5.2)
Sodium: 137 mmol/L (ref 134–144)
eGFR: 62 mL/min/{1.73_m2} (ref >59)

## 2021-10-29 LAB — CBC
Hematocrit: 40.4 % (ref 37.5–51.0)
Hemoglobin: 14.3 g/dL (ref 13.0–17.7)
MCH: 33.7 pg — ABNORMAL HIGH (ref 26.6–33.0)
MCHC: 35.4 g/dL (ref 31.5–35.7)
MCV: 95 fL (ref 79–97)
Platelets: 147 10*3/uL — ABNORMAL LOW (ref 150–450)
RBC: 4.24 x10E6/uL (ref 4.14–5.80)
RDW: 11.1 % — ABNORMAL LOW (ref 11.6–15.4)
WBC: 8.6 10*3/uL (ref 3.4–10.8)

## 2021-10-29 MED ORDER — ASPIRIN EC 81 MG PO TBEC: 81.0000 mg | DELAYED_RELEASE_TABLET | Freq: Every day | ORAL | 3 refills | Status: DC

## 2021-10-29 MED ORDER — METOPROLOL TARTRATE 50 MG PO TABS: ORAL_TABLET | ORAL | 0 refills | Status: DC

## 2021-10-29 NOTE — Progress Notes (Signed)
Pre Surgical Assessment: 5 M Walk Test  44M=16.66ft  5 Meter Walk Test- trial 1: 7.82 seconds 5 Meter Walk Test- trial 2: 8.79 seconds 5 Meter Walk Test- trial 3: 7.92 seconds 5 Meter Walk Test Average: 8.17 seconds

## 2021-10-29 NOTE — Patient Instructions (Signed)
Medication Instructions:  Your physician recommends that you continue on your current medications as directed. Please refer to the Current Medication list given to you today.  *If you need a refill on your cardiac medications before your next appointment, please call your pharmacy*   Lab Work: BMET and CBC   Testing/Procedures: Your physician has requested that you have a cardiac catheterization. Cardiac catheterization is used to diagnose and/or treat various heart conditions. Doctors may recommend this procedure for a number of different reasons. The most common reason is to evaluate chest pain. Chest pain can be a symptom of coronary artery disease (CAD), and cardiac catheterization can show whether plaque is narrowing or blocking your hearts arteries. This procedure is also used to evaluate the valves, as well as measure the blood flow and oxygen levels in different parts of your heart. For further information please visit [email or web address]. Please follow instruction sheet, as given.    Follow-Up: Per structural heart valve team.    Other Instructions  [facility] OFFICE [place], [place]  [place] [address] Dept: [place]: [place]  10/29/2021  You are scheduled for a Cardiac Catheterization on Monday, January 16 with Dr. Lauree Chandler.  1. Please arrive at the [facility] (Main Entrance A) at [facility]: [address] [place], [place] [address] at 8:00 AM (This time is two hours before your procedure to ensure your preparation). Free valet parking service is available.   Special note: Every effort is made to have your procedure done on time. Please understand that emergencies sometimes delay scheduled procedures.  2. Diet: Do not eat solid foods after midnight.  The patient may have clear liquids until 5am upon the day of the  procedure.  3. Labs: You will need to have blood drawn on TODAY.  4. Medication instructions in preparation for your procedure:   Contrast Allergy: No     On the morning of your procedure, take your Aspirin and any morning medicines NOT listed above.  You may use sips of water.  5. Plan for one night stay--bring personal belongings. 6. Bring a current list of your medications and current insurance cards. 7. You MUST have a responsible person to drive you home. 8. Someone MUST be with you the first 24 hours after you arrive home or your discharge will be delayed. 9. Please wear clothes that are easy to get on and off and wear slip-on shoes.  Thank you for allowing [place] to care for you!   -- [facility]

## 2021-10-30 ENCOUNTER — Telehealth: Payer: Self-pay | Admitting: *Deleted

## 2021-10-30 NOTE — Telephone Encounter (Signed)
Cardiac catheterization scheduled at [facility] for: Monday November 03, 2021 10:30 AM [facility] Main Entrance A [facility]) at: 8:30 AM   Diet-no solid food after midnight prior to cath, clear liquids until 5 AM day of procedure.  Medication instructions for procedure: -Usual morning medications can be taken pre-cath with sips of water including aspirin 81 mg.    Confirmed patient has responsible adult to drive home post procedure and be with patient first 24 hours after arriving home.  [facility] does allow one visitor to accompany you and wait in the hospital waiting room while you are there for your procedure. You and your visitor will be asked to wear a mask once you enter the hospital.   Patient reports does not currently have any new symptoms concerning for COVID-19 and no household members with COVID-19 like illness.    Reviewed procedure/mask/visitor instructions with patient.

## 2021-10-31 ENCOUNTER — Inpatient Hospital Stay (HOSPITAL_COMMUNITY)
Admission: RE | Admit: 2021-10-31 | Discharge: 2021-10-31 | Disposition: A | Discharge status: Home / Self Care | Payer: Medicare HMO | Source: Ambulatory Visit | Attending: Cardiovascular Disease | Admitting: Cardiovascular Disease

## 2021-10-31 ENCOUNTER — Other Ambulatory Visit: Payer: Self-pay

## 2021-10-31 DIAGNOSIS — K449 Diaphragmatic hernia without obstruction or gangrene: Secondary | ICD-10-CM | POA: Diagnosis not present

## 2021-10-31 DIAGNOSIS — K8020 Calculus of gallbladder without cholecystitis without obstruction: Secondary | ICD-10-CM | POA: Diagnosis not present

## 2021-10-31 DIAGNOSIS — Z809 Family history of malignant neoplasm, unspecified: Secondary | ICD-10-CM | POA: Diagnosis not present

## 2021-10-31 DIAGNOSIS — M1990 Unspecified osteoarthritis, unspecified site: Secondary | ICD-10-CM | POA: Diagnosis not present

## 2021-10-31 DIAGNOSIS — D62 Acute posthemorrhagic anemia: Secondary | ICD-10-CM | POA: Diagnosis not present

## 2021-10-31 DIAGNOSIS — J9601 Acute respiratory failure with hypoxia: Secondary | ICD-10-CM | POA: Diagnosis not present

## 2021-10-31 DIAGNOSIS — Z01810 Encounter for preprocedural cardiovascular examination: Secondary | ICD-10-CM | POA: Diagnosis not present

## 2021-10-31 DIAGNOSIS — R578 Other shock: Secondary | ICD-10-CM | POA: Diagnosis not present

## 2021-10-31 DIAGNOSIS — R2689 Other abnormalities of gait and mobility: Secondary | ICD-10-CM | POA: Diagnosis not present

## 2021-10-31 DIAGNOSIS — R0989 Other specified symptoms and signs involving the circulatory and respiratory systems: Secondary | ICD-10-CM | POA: Diagnosis not present

## 2021-10-31 DIAGNOSIS — E871 Hypo-osmolality and hyponatremia: Secondary | ICD-10-CM | POA: Diagnosis not present

## 2021-10-31 DIAGNOSIS — Z87891 Personal history of nicotine dependence: Secondary | ICD-10-CM | POA: Diagnosis not present

## 2021-10-31 DIAGNOSIS — I97618 Postprocedural hemorrhage and hematoma of a circulatory system organ or structure following other circulatory system procedure: Secondary | ICD-10-CM | POA: Diagnosis not present

## 2021-10-31 DIAGNOSIS — Z953 Presence of xenogenic heart valve: Secondary | ICD-10-CM | POA: Diagnosis not present

## 2021-10-31 DIAGNOSIS — Z01818 Encounter for other preprocedural examination: Secondary | ICD-10-CM | POA: Diagnosis not present

## 2021-10-31 DIAGNOSIS — D6959 Other secondary thrombocytopenia: Secondary | ICD-10-CM | POA: Diagnosis present

## 2021-10-31 DIAGNOSIS — Z951 Presence of aortocoronary bypass graft: Secondary | ICD-10-CM | POA: Diagnosis not present

## 2021-10-31 DIAGNOSIS — R0602 Shortness of breath: Secondary | ICD-10-CM | POA: Diagnosis not present

## 2021-10-31 DIAGNOSIS — K219 Gastro-esophageal reflux disease without esophagitis: Secondary | ICD-10-CM | POA: Diagnosis present

## 2021-10-31 DIAGNOSIS — R079 Chest pain, unspecified: Secondary | ICD-10-CM | POA: Diagnosis not present

## 2021-10-31 DIAGNOSIS — Z4682 Encounter for fitting and adjustment of non-vascular catheter: Secondary | ICD-10-CM | POA: Diagnosis not present

## 2021-10-31 DIAGNOSIS — R55 Syncope and collapse: Secondary | ICD-10-CM

## 2021-10-31 DIAGNOSIS — J9811 Atelectasis: Secondary | ICD-10-CM | POA: Diagnosis not present

## 2021-10-31 DIAGNOSIS — Z20822 Contact with and (suspected) exposure to covid-19: Secondary | ICD-10-CM | POA: Diagnosis present

## 2021-10-31 DIAGNOSIS — I442 Atrioventricular block, complete: Secondary | ICD-10-CM | POA: Diagnosis present

## 2021-10-31 DIAGNOSIS — Z79899 Other long term (current) drug therapy: Secondary | ICD-10-CM | POA: Diagnosis not present

## 2021-10-31 DIAGNOSIS — J984 Other disorders of lung: Secondary | ICD-10-CM | POA: Diagnosis not present

## 2021-10-31 DIAGNOSIS — E8771 Transfusion associated circulatory overload: Secondary | ICD-10-CM | POA: Diagnosis not present

## 2021-10-31 DIAGNOSIS — I700 Atherosclerosis of aorta: Secondary | ICD-10-CM | POA: Diagnosis not present

## 2021-10-31 DIAGNOSIS — M6281 Muscle weakness (generalized): Secondary | ICD-10-CM | POA: Diagnosis not present

## 2021-10-31 DIAGNOSIS — I080 Rheumatic disorders of both mitral and aortic valves: Secondary | ICD-10-CM | POA: Diagnosis not present

## 2021-10-31 DIAGNOSIS — R1311 Dysphagia, oral phase: Secondary | ICD-10-CM | POA: Diagnosis not present

## 2021-10-31 DIAGNOSIS — Z950 Presence of cardiac pacemaker: Secondary | ICD-10-CM | POA: Diagnosis not present

## 2021-10-31 DIAGNOSIS — I455 Other specified heart block: Secondary | ICD-10-CM | POA: Diagnosis not present

## 2021-10-31 DIAGNOSIS — J939 Pneumothorax, unspecified: Secondary | ICD-10-CM | POA: Diagnosis not present

## 2021-10-31 DIAGNOSIS — I493 Ventricular premature depolarization: Secondary | ICD-10-CM | POA: Diagnosis not present

## 2021-10-31 DIAGNOSIS — I517 Cardiomegaly: Secondary | ICD-10-CM | POA: Diagnosis not present

## 2021-10-31 DIAGNOSIS — R531 Weakness: Secondary | ICD-10-CM | POA: Diagnosis not present

## 2021-10-31 DIAGNOSIS — I358 Other nonrheumatic aortic valve disorders: Secondary | ICD-10-CM | POA: Diagnosis not present

## 2021-10-31 DIAGNOSIS — K5730 Diverticulosis of large intestine without perforation or abscess without bleeding: Secondary | ICD-10-CM | POA: Diagnosis not present

## 2021-10-31 DIAGNOSIS — R911 Solitary pulmonary nodule: Secondary | ICD-10-CM | POA: Diagnosis not present

## 2021-10-31 DIAGNOSIS — J90 Pleural effusion, not elsewhere classified: Secondary | ICD-10-CM | POA: Diagnosis not present

## 2021-10-31 DIAGNOSIS — I2510 Atherosclerotic heart disease of native coronary artery without angina pectoris: Secondary | ICD-10-CM | POA: Diagnosis present

## 2021-10-31 DIAGNOSIS — Y832 Surgical operation with anastomosis, bypass or graft as the cause of abnormal reaction of the patient, or of later complication, without mention of misadventure at the time of the procedure: Secondary | ICD-10-CM | POA: Diagnosis not present

## 2021-10-31 DIAGNOSIS — I350 Nonrheumatic aortic (valve) stenosis: Secondary | ICD-10-CM

## 2021-10-31 DIAGNOSIS — Z7982 Long term (current) use of aspirin: Secondary | ICD-10-CM | POA: Diagnosis not present

## 2021-10-31 DIAGNOSIS — Z7401 Bed confinement status: Secondary | ICD-10-CM | POA: Diagnosis not present

## 2021-10-31 DIAGNOSIS — Z833 Family history of diabetes mellitus: Secondary | ICD-10-CM | POA: Diagnosis not present

## 2021-10-31 DIAGNOSIS — E7800 Pure hypercholesterolemia, unspecified: Secondary | ICD-10-CM | POA: Diagnosis present

## 2021-10-31 DIAGNOSIS — Z452 Encounter for adjustment and management of vascular access device: Secondary | ICD-10-CM | POA: Diagnosis not present

## 2021-10-31 DIAGNOSIS — I97611 Postprocedural hemorrhage and hematoma of a circulatory system organ or structure following cardiac bypass: Secondary | ICD-10-CM | POA: Diagnosis not present

## 2021-10-31 DIAGNOSIS — E872 Acidosis, unspecified: Secondary | ICD-10-CM | POA: Diagnosis present

## 2021-10-31 DIAGNOSIS — N179 Acute kidney failure, unspecified: Secondary | ICD-10-CM | POA: Diagnosis not present

## 2021-10-31 DIAGNOSIS — D649 Anemia, unspecified: Secondary | ICD-10-CM | POA: Diagnosis not present

## 2021-10-31 DIAGNOSIS — R262 Difficulty in walking, not elsewhere classified: Secondary | ICD-10-CM | POA: Diagnosis not present

## 2021-10-31 DIAGNOSIS — D688 Other specified coagulation defects: Secondary | ICD-10-CM | POA: Diagnosis not present

## 2021-10-31 DIAGNOSIS — I10 Essential (primary) hypertension: Secondary | ICD-10-CM | POA: Diagnosis present

## 2021-10-31 DIAGNOSIS — Z952 Presence of prosthetic heart valve: Secondary | ICD-10-CM | POA: Diagnosis not present

## 2021-10-31 DIAGNOSIS — Z8249 Family history of ischemic heart disease and other diseases of the circulatory system: Secondary | ICD-10-CM | POA: Diagnosis not present

## 2021-10-31 DIAGNOSIS — J9383 Other pneumothorax: Secondary | ICD-10-CM | POA: Diagnosis not present

## 2021-10-31 MED ORDER — IOHEXOL 350 MG/ML SOLN
100.0000 mL | Freq: Once | INTRAVENOUS | Status: AC | PRN
  Administered 2021-10-31: 100 mL via INTRAVENOUS

## 2021-11-03 ENCOUNTER — Encounter (HOSPITAL_COMMUNITY)
Admission: AD | Disposition: A | Discharge status: Skilled Nursing Facility | Payer: Self-pay | Source: Home / Self Care | Attending: Thoracic Surgery (Cardiothoracic Vascular Surgery)

## 2021-11-03 ENCOUNTER — Inpatient Hospital Stay (HOSPITAL_COMMUNITY): Payer: Medicare HMO

## 2021-11-03 ENCOUNTER — Inpatient Hospital Stay (HOSPITAL_COMMUNITY)
Admission: AD | Admit: 2021-11-03 | Discharge: 2021-11-21 | DRG: 216 | Disposition: A | Discharge status: Skilled Nursing Facility | Payer: Medicare HMO | Attending: Thoracic Surgery (Cardiothoracic Vascular Surgery) | Admitting: Thoracic Surgery (Cardiothoracic Vascular Surgery)

## 2021-11-03 ENCOUNTER — Encounter: Payer: Self-pay | Admitting: Physician Assistant

## 2021-11-03 ENCOUNTER — Other Ambulatory Visit: Payer: Self-pay

## 2021-11-03 DIAGNOSIS — D649 Anemia, unspecified: Secondary | ICD-10-CM | POA: Diagnosis not present

## 2021-11-03 DIAGNOSIS — R0602 Shortness of breath: Secondary | ICD-10-CM | POA: Diagnosis not present

## 2021-11-03 DIAGNOSIS — Y832 Surgical operation with anastomosis, bypass or graft as the cause of abnormal reaction of the patient, or of later complication, without mention of misadventure at the time of the procedure: Secondary | ICD-10-CM | POA: Diagnosis not present

## 2021-11-03 DIAGNOSIS — K219 Gastro-esophageal reflux disease without esophagitis: Secondary | ICD-10-CM | POA: Diagnosis present

## 2021-11-03 DIAGNOSIS — J9811 Atelectasis: Secondary | ICD-10-CM

## 2021-11-03 DIAGNOSIS — I97611 Postprocedural hemorrhage and hematoma of a circulatory system organ or structure following cardiac bypass: Secondary | ICD-10-CM | POA: Diagnosis not present

## 2021-11-03 DIAGNOSIS — Z452 Encounter for adjustment and management of vascular access device: Secondary | ICD-10-CM | POA: Diagnosis not present

## 2021-11-03 DIAGNOSIS — Z7982 Long term (current) use of aspirin: Secondary | ICD-10-CM | POA: Diagnosis not present

## 2021-11-03 DIAGNOSIS — Z950 Presence of cardiac pacemaker: Secondary | ICD-10-CM | POA: Diagnosis not present

## 2021-11-03 DIAGNOSIS — E871 Hypo-osmolality and hyponatremia: Secondary | ICD-10-CM | POA: Diagnosis not present

## 2021-11-03 DIAGNOSIS — J9383 Other pneumothorax: Secondary | ICD-10-CM | POA: Diagnosis not present

## 2021-11-03 DIAGNOSIS — J939 Pneumothorax, unspecified: Secondary | ICD-10-CM

## 2021-11-03 DIAGNOSIS — J9601 Acute respiratory failure with hypoxia: Secondary | ICD-10-CM | POA: Diagnosis not present

## 2021-11-03 DIAGNOSIS — R079 Chest pain, unspecified: Secondary | ICD-10-CM | POA: Diagnosis not present

## 2021-11-03 DIAGNOSIS — R58 Hemorrhage, not elsewhere classified: Secondary | ICD-10-CM

## 2021-11-03 DIAGNOSIS — Z8249 Family history of ischemic heart disease and other diseases of the circulatory system: Secondary | ICD-10-CM | POA: Diagnosis not present

## 2021-11-03 DIAGNOSIS — Z01812 Encounter for preprocedural laboratory examination: Secondary | ICD-10-CM

## 2021-11-03 DIAGNOSIS — D6959 Other secondary thrombocytopenia: Secondary | ICD-10-CM | POA: Diagnosis not present

## 2021-11-03 DIAGNOSIS — Z809 Family history of malignant neoplasm, unspecified: Secondary | ICD-10-CM

## 2021-11-03 DIAGNOSIS — I10 Essential (primary) hypertension: Secondary | ICD-10-CM | POA: Diagnosis present

## 2021-11-03 DIAGNOSIS — I441 Atrioventricular block, second degree: Secondary | ICD-10-CM | POA: Diagnosis present

## 2021-11-03 DIAGNOSIS — J984 Other disorders of lung: Secondary | ICD-10-CM | POA: Diagnosis not present

## 2021-11-03 DIAGNOSIS — I517 Cardiomegaly: Secondary | ICD-10-CM | POA: Diagnosis not present

## 2021-11-03 DIAGNOSIS — M1990 Unspecified osteoarthritis, unspecified site: Secondary | ICD-10-CM | POA: Diagnosis not present

## 2021-11-03 DIAGNOSIS — Z952 Presence of prosthetic heart valve: Secondary | ICD-10-CM | POA: Diagnosis not present

## 2021-11-03 DIAGNOSIS — I442 Atrioventricular block, complete: Secondary | ICD-10-CM | POA: Diagnosis not present

## 2021-11-03 DIAGNOSIS — D62 Acute posthemorrhagic anemia: Secondary | ICD-10-CM | POA: Diagnosis not present

## 2021-11-03 DIAGNOSIS — J90 Pleural effusion, not elsewhere classified: Secondary | ICD-10-CM | POA: Diagnosis not present

## 2021-11-03 DIAGNOSIS — I2510 Atherosclerotic heart disease of native coronary artery without angina pectoris: Secondary | ICD-10-CM | POA: Diagnosis present

## 2021-11-03 DIAGNOSIS — E785 Hyperlipidemia, unspecified: Secondary | ICD-10-CM | POA: Diagnosis present

## 2021-11-03 DIAGNOSIS — Z951 Presence of aortocoronary bypass graft: Secondary | ICD-10-CM

## 2021-11-03 DIAGNOSIS — Z79899 Other long term (current) drug therapy: Secondary | ICD-10-CM | POA: Diagnosis not present

## 2021-11-03 DIAGNOSIS — N179 Acute kidney failure, unspecified: Secondary | ICD-10-CM | POA: Diagnosis not present

## 2021-11-03 DIAGNOSIS — Z4659 Encounter for fitting and adjustment of other gastrointestinal appliance and device: Secondary | ICD-10-CM

## 2021-11-03 DIAGNOSIS — Z953 Presence of xenogenic heart valve: Secondary | ICD-10-CM | POA: Diagnosis not present

## 2021-11-03 DIAGNOSIS — I350 Nonrheumatic aortic (valve) stenosis: Secondary | ICD-10-CM | POA: Diagnosis present

## 2021-11-03 DIAGNOSIS — D688 Other specified coagulation defects: Secondary | ICD-10-CM | POA: Diagnosis not present

## 2021-11-03 DIAGNOSIS — I97618 Postprocedural hemorrhage and hematoma of a circulatory system organ or structure following other circulatory system procedure: Secondary | ICD-10-CM | POA: Diagnosis not present

## 2021-11-03 DIAGNOSIS — Z87891 Personal history of nicotine dependence: Secondary | ICD-10-CM

## 2021-11-03 DIAGNOSIS — E7800 Pure hypercholesterolemia, unspecified: Secondary | ICD-10-CM | POA: Diagnosis present

## 2021-11-03 DIAGNOSIS — Z833 Family history of diabetes mellitus: Secondary | ICD-10-CM

## 2021-11-03 DIAGNOSIS — Z01818 Encounter for other preprocedural examination: Secondary | ICD-10-CM | POA: Diagnosis not present

## 2021-11-03 DIAGNOSIS — Z4682 Encounter for fitting and adjustment of non-vascular catheter: Secondary | ICD-10-CM | POA: Diagnosis not present

## 2021-11-03 DIAGNOSIS — Z20822 Contact with and (suspected) exposure to covid-19: Secondary | ICD-10-CM | POA: Diagnosis present

## 2021-11-03 DIAGNOSIS — E872 Acidosis, unspecified: Secondary | ICD-10-CM | POA: Diagnosis present

## 2021-11-03 DIAGNOSIS — Z9689 Presence of other specified functional implants: Secondary | ICD-10-CM

## 2021-11-03 DIAGNOSIS — R062 Wheezing: Secondary | ICD-10-CM

## 2021-11-03 DIAGNOSIS — I700 Atherosclerosis of aorta: Secondary | ICD-10-CM | POA: Diagnosis not present

## 2021-11-03 DIAGNOSIS — R578 Other shock: Secondary | ICD-10-CM | POA: Diagnosis not present

## 2021-11-03 DIAGNOSIS — T502X5A Adverse effect of carbonic-anhydrase inhibitors, benzothiadiazides and other diuretics, initial encounter: Secondary | ICD-10-CM | POA: Diagnosis not present

## 2021-11-03 DIAGNOSIS — Z01810 Encounter for preprocedural cardiovascular examination: Secondary | ICD-10-CM | POA: Diagnosis not present

## 2021-11-03 DIAGNOSIS — E8771 Transfusion associated circulatory overload: Secondary | ICD-10-CM | POA: Diagnosis not present

## 2021-11-03 DIAGNOSIS — I080 Rheumatic disorders of both mitral and aortic valves: Secondary | ICD-10-CM | POA: Diagnosis not present

## 2021-11-03 DIAGNOSIS — I358 Other nonrheumatic aortic valve disorders: Secondary | ICD-10-CM | POA: Diagnosis not present

## 2021-11-03 HISTORY — DX: Nonrheumatic aortic (valve) stenosis: I35.0

## 2021-11-03 HISTORY — PX: RIGHT/LEFT HEART CATH AND CORONARY ANGIOGRAPHY: CATH118266

## 2021-11-03 HISTORY — PX: INTRAVASCULAR PRESSURE WIRE/FFR STUDY: CATH118243

## 2021-11-03 LAB — BLOOD GAS, ARTERIAL
Acid-base deficit: 0.3 mmol/L (ref 0.0–2.0)
Bicarbonate: 23.3 mmol/L (ref 20.0–28.0)
FIO2: 21
O2 Saturation: 95.2 %
Patient temperature: 36.8
pCO2 arterial: 34.2 mmHg (ref 32.0–48.0)
pH, Arterial: 7.447 (ref 7.350–7.450)
pO2, Arterial: 73.8 mmHg — ABNORMAL LOW (ref 83.0–108.0)

## 2021-11-03 LAB — COMPREHENSIVE METABOLIC PANEL
ALT: 24 U/L (ref 0–44)
AST: 36 U/L (ref 15–41)
Albumin: 3.5 g/dL (ref 3.5–5.0)
Alkaline Phosphatase: 55 U/L (ref 38–126)
Anion gap: 12 (ref 5–15)
BUN: 12 mg/dL (ref 8–23)
CO2: 21 mmol/L — ABNORMAL LOW (ref 22–32)
Calcium: 8.9 mg/dL (ref 8.9–10.3)
Chloride: 101 mmol/L (ref 98–111)
Creatinine, Ser: 0.95 mg/dL (ref 0.61–1.24)
GFR, Estimated: >60 mL/min (ref >60)
Glucose, Bld: 149 mg/dL — ABNORMAL HIGH (ref 70–99)
Potassium: 4 mmol/L (ref 3.5–5.1)
Sodium: 134 mmol/L — ABNORMAL LOW (ref 135–145)
Total Bilirubin: 1.7 mg/dL — ABNORMAL HIGH (ref 0.3–1.2)
Total Protein: 6.3 g/dL — ABNORMAL LOW (ref 6.5–8.1)

## 2021-11-03 LAB — URINALYSIS, ROUTINE W REFLEX MICROSCOPIC
Bilirubin Urine: NEGATIVE
Glucose, UA: NEGATIVE mg/dL
Hgb urine dipstick: NEGATIVE
Ketones, ur: NEGATIVE mg/dL
Leukocytes,Ua: NEGATIVE
Nitrite: NEGATIVE
Protein, ur: NEGATIVE mg/dL
Specific Gravity, Urine: 1.01 (ref 1.005–1.030)
pH: 5.5 (ref 5.0–8.0)

## 2021-11-03 LAB — POCT I-STAT EG7
Acid-base deficit: 1 mmol/L (ref 0.0–2.0)
Bicarbonate: 23.6 mmol/L (ref 20.0–28.0)
Calcium, Ion: 1.06 mmol/L — ABNORMAL LOW (ref 1.15–1.40)
HCT: 33 % — ABNORMAL LOW (ref 39.0–52.0)
Hemoglobin: 11.2 g/dL — ABNORMAL LOW (ref 13.0–17.0)
O2 Saturation: 82 %
Potassium: 3.8 mmol/L (ref 3.5–5.1)
Sodium: 142 mmol/L (ref 135–145)
TCO2: 25 mmol/L (ref 22–32)
pCO2, Ven: 38.7 mmHg — ABNORMAL LOW (ref 44.0–60.0)
pH, Ven: 7.392 (ref 7.250–7.430)
pO2, Ven: 46 mmHg — ABNORMAL HIGH (ref 32.0–45.0)

## 2021-11-03 LAB — HEMOGLOBIN A1C
Hgb A1c MFr Bld: 5.3 % (ref 4.8–5.6)
Mean Plasma Glucose: 105.41 mg/dL

## 2021-11-03 LAB — APTT: aPTT: 28 seconds (ref 24–36)

## 2021-11-03 LAB — PROTIME-INR
INR: 1 (ref 0.8–1.2)
Prothrombin Time: 13 seconds (ref 11.4–15.2)

## 2021-11-03 LAB — ABO/RH: ABO/RH(D): B POS

## 2021-11-03 LAB — POCT ACTIVATED CLOTTING TIME: Activated Clotting Time: 323 seconds

## 2021-11-03 LAB — SARS CORONAVIRUS 2 BY RT PCR (HOSPITAL ORDER, PERFORMED IN ~~LOC~~ HOSPITAL LAB): SARS Coronavirus 2: NEGATIVE

## 2021-11-03 SURGERY — RIGHT/LEFT HEART CATH AND CORONARY ANGIOGRAPHY
Anesthesia: LOCAL

## 2021-11-03 MED ORDER — SODIUM CHLORIDE 0.9 % IV SOLN: INTRAVENOUS | Status: AC

## 2021-11-03 MED ORDER — NITROGLYCERIN IN D5W 200-5 MCG/ML-% IV SOLN
2.0000 ug/min | INTRAVENOUS | Status: DC
  Filled 2021-11-03: qty 250

## 2021-11-03 MED ORDER — SODIUM CHLORIDE 0.9% FLUSH: 3.0000 mL | Freq: Two times a day (BID) | INTRAVENOUS | Status: DC

## 2021-11-03 MED ORDER — NOREPINEPHRINE 4 MG/250ML-% IV SOLN
0.0000 ug/min | INTRAVENOUS | Status: DC
  Filled 2021-11-03: qty 250

## 2021-11-03 MED ORDER — BISACODYL 5 MG PO TBEC
5.0000 mg | DELAYED_RELEASE_TABLET | Freq: Once | ORAL | Status: DC
  Filled 2021-11-03: qty 1

## 2021-11-03 MED ORDER — HEPARIN 30,000 UNITS/1000 ML (OHS) CELLSAVER SOLUTION
Status: DC
  Filled 2021-11-03: qty 1000

## 2021-11-03 MED ORDER — INSULIN REGULAR(HUMAN) IN NACL 100-0.9 UT/100ML-% IV SOLN
INTRAVENOUS | Status: AC
  Administered 2021-11-04: 1 [IU]/h via INTRAVENOUS
  Filled 2021-11-03: qty 100

## 2021-11-03 MED ORDER — HEPARIN SODIUM (PORCINE) 1000 UNIT/ML IJ SOLN
INTRAMUSCULAR | Status: DC | PRN
  Administered 2021-11-03: 6000 [IU] via INTRAVENOUS
  Administered 2021-11-03: 4000 [IU] via INTRAVENOUS

## 2021-11-03 MED ORDER — HEPARIN (PORCINE) IN NACL 1000-0.9 UT/500ML-% IV SOLN
INTRAVENOUS | Status: DC | PRN
  Administered 2021-11-03 (×2): 500 mL

## 2021-11-03 MED ORDER — VERAPAMIL HCL 2.5 MG/ML IV SOLN
INTRAVENOUS | Status: DC | PRN
  Administered 2021-11-03: 10 mL via INTRA_ARTERIAL

## 2021-11-03 MED ORDER — LABETALOL HCL 5 MG/ML IV SOLN: 10.0000 mg | INTRAVENOUS | Status: AC | PRN

## 2021-11-03 MED ORDER — ATORVASTATIN CALCIUM 40 MG PO TABS
40.0000 mg | ORAL_TABLET | Freq: Every day | ORAL | Status: DC
  Administered 2021-11-04: 09:00:00 40 mg via ORAL
  Filled 2021-11-03: qty 1

## 2021-11-03 MED ORDER — MANNITOL 20 % IV SOLN
INTRAVENOUS | Status: DC
  Filled 2021-11-03: qty 13

## 2021-11-03 MED ORDER — CHLORHEXIDINE GLUCONATE 0.12 % MT SOLN
15.0000 mL | Freq: Once | OROMUCOSAL | Status: AC
  Administered 2021-11-04: 15 mL via OROMUCOSAL
  Filled 2021-11-03: qty 15

## 2021-11-03 MED ORDER — VERAPAMIL HCL 2.5 MG/ML IV SOLN
INTRAVENOUS | Status: AC
  Filled 2021-11-03: qty 2

## 2021-11-03 MED ORDER — LISINOPRIL 20 MG PO TABS
20.0000 mg | ORAL_TABLET | Freq: Every day | ORAL | Status: DC
  Administered 2021-11-04: 20 mg via ORAL
  Filled 2021-11-03: qty 1

## 2021-11-03 MED ORDER — MILRINONE LACTATE IN DEXTROSE 20-5 MG/100ML-% IV SOLN
0.3000 ug/kg/min | INTRAVENOUS | Status: DC
  Filled 2021-11-03: qty 100

## 2021-11-03 MED ORDER — TEMAZEPAM 15 MG PO CAPS
15.0000 mg | ORAL_CAPSULE | Freq: Once | ORAL | Status: AC | PRN
  Administered 2021-11-03: 15 mg via ORAL
  Filled 2021-11-03: qty 1

## 2021-11-03 MED ORDER — ASPIRIN EC 81 MG PO TBEC
81.0000 mg | DELAYED_RELEASE_TABLET | Freq: Every day | ORAL | Status: DC
  Administered 2021-11-04: 81 mg via ORAL
  Filled 2021-11-03: qty 1

## 2021-11-03 MED ORDER — ACETAMINOPHEN 325 MG PO TABS: 650.0000 mg | ORAL_TABLET | ORAL | Status: DC | PRN

## 2021-11-03 MED ORDER — FENTANYL CITRATE (PF) 100 MCG/2ML IJ SOLN
INTRAMUSCULAR | Status: AC
  Filled 2021-11-03: qty 2

## 2021-11-03 MED ORDER — MUPIROCIN 2 % EX OINT: 1.0000 "application " | TOPICAL_OINTMENT | Freq: Two times a day (BID) | CUTANEOUS | Status: DC

## 2021-11-03 MED ORDER — SODIUM CHLORIDE 0.9% FLUSH: 3.0000 mL | INTRAVENOUS | Status: DC | PRN

## 2021-11-03 MED ORDER — HEPARIN (PORCINE) IN NACL 1000-0.9 UT/500ML-% IV SOLN
INTRAVENOUS | Status: AC
  Filled 2021-11-03: qty 1000

## 2021-11-03 MED ORDER — HYDRALAZINE HCL 20 MG/ML IJ SOLN: 10.0000 mg | INTRAMUSCULAR | Status: AC | PRN

## 2021-11-03 MED ORDER — SODIUM CHLORIDE 0.9 % IV SOLN: 250.0000 mL | INTRAVENOUS | Status: DC | PRN

## 2021-11-03 MED ORDER — CHLORHEXIDINE GLUCONATE CLOTH 2 % EX PADS
6.0000 | MEDICATED_PAD | Freq: Once | CUTANEOUS | Status: AC
  Administered 2021-11-03: 6 via TOPICAL

## 2021-11-03 MED ORDER — ONDANSETRON HCL 4 MG/2ML IJ SOLN: 4.0000 mg | Freq: Four times a day (QID) | INTRAMUSCULAR | Status: DC | PRN

## 2021-11-03 MED ORDER — EPINEPHRINE HCL 5 MG/250ML IV SOLN IN NS
0.0000 ug/min | INTRAVENOUS | Status: DC
  Filled 2021-11-03: qty 250

## 2021-11-03 MED ORDER — CEFAZOLIN SODIUM-DEXTROSE 2-4 GM/100ML-% IV SOLN
2.0000 g | INTRAVENOUS | Status: AC
  Administered 2021-11-04 (×2): 2 g via INTRAVENOUS
  Filled 2021-11-03: qty 100

## 2021-11-03 MED ORDER — MIDAZOLAM HCL 2 MG/2ML IJ SOLN
INTRAMUSCULAR | Status: DC | PRN
  Administered 2021-11-03 (×2): 1 mg via INTRAVENOUS

## 2021-11-03 MED ORDER — TRANEXAMIC ACID 1000 MG/10ML IV SOLN
1.5000 mg/kg/h | INTRAVENOUS | Status: AC
  Administered 2021-11-04: 1.5 mg/kg/h via INTRAVENOUS
  Filled 2021-11-03: qty 25

## 2021-11-03 MED ORDER — METOPROLOL TARTRATE 12.5 MG HALF TABLET
12.5000 mg | ORAL_TABLET | Freq: Once | ORAL | Status: AC
  Administered 2021-11-04: 12.5 mg via ORAL
  Filled 2021-11-03: qty 1

## 2021-11-03 MED ORDER — IOHEXOL 350 MG/ML SOLN
INTRAVENOUS | Status: DC | PRN
  Administered 2021-11-03: 80 mL via INTRACARDIAC

## 2021-11-03 MED ORDER — POTASSIUM CHLORIDE 2 MEQ/ML IV SOLN
80.0000 meq | INTRAVENOUS | Status: DC
  Filled 2021-11-03: qty 40

## 2021-11-03 MED ORDER — VANCOMYCIN HCL 1250 MG/250ML IV SOLN
1250.0000 mg | INTRAVENOUS | Status: AC
  Administered 2021-11-04: 1250 mg via INTRAVENOUS
  Filled 2021-11-03: qty 250

## 2021-11-03 MED ORDER — ASPIRIN 81 MG PO CHEW: 81.0000 mg | CHEWABLE_TABLET | ORAL | Status: DC

## 2021-11-03 MED ORDER — SODIUM CHLORIDE 0.9 % WEIGHT BASED INFUSION: 1.0000 mL/kg/h | INTRAVENOUS | Status: DC

## 2021-11-03 MED ORDER — SODIUM CHLORIDE 0.9 % WEIGHT BASED INFUSION
3.0000 mL/kg/h | INTRAVENOUS | Status: DC
  Administered 2021-11-03: 3 mL/kg/h via INTRAVENOUS

## 2021-11-03 MED ORDER — LIDOCAINE HCL (PF) 1 % IJ SOLN
INTRAMUSCULAR | Status: DC | PRN
  Administered 2021-11-03 (×2): 2 mL

## 2021-11-03 MED ORDER — CHLORHEXIDINE GLUCONATE CLOTH 2 % EX PADS
6.0000 | MEDICATED_PAD | Freq: Once | CUTANEOUS | Status: AC
  Administered 2021-11-04: 6 via TOPICAL

## 2021-11-03 MED ORDER — DEXMEDETOMIDINE HCL IN NACL 400 MCG/100ML IV SOLN
0.1000 ug/kg/h | INTRAVENOUS | Status: AC
  Administered 2021-11-04: .3 ug/kg/h via INTRAVENOUS
  Filled 2021-11-03: qty 100

## 2021-11-03 MED ORDER — CEFAZOLIN SODIUM-DEXTROSE 2-4 GM/100ML-% IV SOLN
2.0000 g | INTRAVENOUS | Status: DC
  Filled 2021-11-03: qty 100

## 2021-11-03 MED ORDER — PLASMA-LYTE A IV SOLN
INTRAVENOUS | Status: DC
  Filled 2021-11-03: qty 5

## 2021-11-03 MED ORDER — MIDAZOLAM HCL 2 MG/2ML IJ SOLN
INTRAMUSCULAR | Status: AC
  Filled 2021-11-03: qty 2

## 2021-11-03 MED ORDER — LIDOCAINE HCL (PF) 1 % IJ SOLN
INTRAMUSCULAR | Status: AC
  Filled 2021-11-03: qty 30

## 2021-11-03 MED ORDER — TRANEXAMIC ACID (OHS) PUMP PRIME SOLUTION
2.0000 mg/kg | INTRAVENOUS | Status: DC
  Filled 2021-11-03: qty 1.68

## 2021-11-03 MED ORDER — PHENYLEPHRINE HCL-NACL 20-0.9 MG/250ML-% IV SOLN
30.0000 ug/min | INTRAVENOUS | Status: AC
  Administered 2021-11-04: 25 ug/min via INTRAVENOUS
  Administered 2021-11-04: 15 ug/min via INTRAVENOUS
  Filled 2021-11-03: qty 250

## 2021-11-03 MED ORDER — FENTANYL CITRATE (PF) 100 MCG/2ML IJ SOLN
INTRAMUSCULAR | Status: DC | PRN
  Administered 2021-11-03 (×2): 25 ug via INTRAVENOUS

## 2021-11-03 MED ORDER — TRANEXAMIC ACID (OHS) BOLUS VIA INFUSION
15.0000 mg/kg | INTRAVENOUS | Status: AC
  Administered 2021-11-04: 1258.5 mg via INTRAVENOUS

## 2021-11-03 SURGICAL SUPPLY — 15 items
CATH 5FR JL3.5 JR4 ANG PIG MP (CATHETERS) ×1 IMPLANT
CATH BALLN WEDGE 5F 110CM (CATHETERS) ×1 IMPLANT
CATH VISTA GUIDE 6FR XBLAD3.5 (CATHETERS) ×1 IMPLANT
DEVICE RAD COMP TR BAND LRG (VASCULAR PRODUCTS) ×2 IMPLANT
GLIDESHEATH SLEND SS 6F .021 (SHEATH) ×1 IMPLANT
GUIDEWIRE .025 260CM (WIRE) ×1 IMPLANT
GUIDEWIRE INQWIRE 1.5J.035X260 (WIRE) IMPLANT
GUIDEWIRE PRESSURE X 175 (WIRE) ×1 IMPLANT
INQWIRE 1.5J .035X260CM (WIRE) ×2
KIT ESSENTIALS PG (KITS) ×1 IMPLANT
KIT HEART LEFT (KITS) ×2 IMPLANT
PACK CARDIAC CATHETERIZATION (CUSTOM PROCEDURE TRAY) ×2 IMPLANT
SHEATH GLIDE SLENDER 4/5FR (SHEATH) ×1 IMPLANT
TRANSDUCER W/STOPCOCK (MISCELLANEOUS) ×2 IMPLANT
TUBING CIL FLEX 10 FLL-RA (TUBING) ×2 IMPLANT

## 2021-11-03 NOTE — Interval H&P Note (Signed)
History and Physical Interval Note:  11/03/2021 9:38 AM  Joshua [name]  has presented today for surgery, with the diagnosis of aortic stenosis - pre tavr.  The various methods of treatment have been discussed with the patient and family. After consideration of risks, benefits and other options for treatment, the patient has consented to  Procedure(s): RIGHT/LEFT HEART CATH AND CORONARY ANGIOGRAPHY (N/A) as a surgical intervention.  The patient's history has been reviewed, patient examined, no change in status, stable for surgery.  I have reviewed the patient's chart and labs.  Questions were answered to the patient's satisfaction.    Cath Lab Visit (complete for each Cath Lab visit)  Clinical Evaluation Leading to the Procedure:   ACS: No.  Non-ACS:    Anginal Classification: CCS I  Anti-ischemic medical therapy: Minimal Therapy (1 class of medications)  Non-Invasive Test Results: No non-invasive testing performed  Prior CABG: No previous CABG        Lauree Chandler

## 2021-11-03 NOTE — Consult Note (Addendum)
[address]       [place] [address]             [phone]        Leondro T Villatoro Iola Medical Record #[id number] Date of Birth: 07-11-1936  Referring: Angelena Form Primary Care: Shelda Pal, DO Primary Cardiologist: O'Neal  Chief Complaint: Aortic Stenosis, 3V CAD  History of Present Illness:      Joshua [name] is an 86 yo male with history of PVC's, arthritis, GERD, HTN, HLD, Aortic Stenosis.  The patient was referred to Dr. Audie Box by his PCP after he suffered a syncopal episode.  The patient stated he was walking in his neighborhood when he felt light headed and ultimately passed out.  He denied seizure like activity and did not experience any loss of bladder/stool function.  His Aortic Stenosis was originally diagnosed in 2017, has not been followed up on since that time as he did not know this was required.  The patient also experiences lower extremity edema which has been present for the past several months.  Physical exam by Dr. Jenetta DownerNori Riis revealed a very loud murmur with an absent S2 and he was concerned he had progression to severe aortic stenosis.  Repeat Echocardiogram was obtained and showed a preserved EF of 60-65% with severe aortic stenosis with Mean gradient 58 mmHg, peak gradient 95.6 mmHg, AVA 0.79 cm2, DI 0.20.  His valve leaflets are also calcified and thickened with restricted leaflet motion.  He was referred to Dr. Angelena Form for possible TAVR procedure.  It was felt with recent syncope the patient should have expedited TAVR procedure.  He would require cardiac catheterization prior to proceeding.  This was performed today 1/16 and showed 3v CAD.  This was felt the patient would require coronary bypass grafting procedure and conventional Aortic Valve replacement and Cardiac surgery consultation was requested.  The patient is currently symptom free.  He is disappointed he is unable to have a TAVR procedure.  Prior to surgery he was not that  active due to personal choice.  He was able to ambulate to the mailbox without difficulty, however his son states he previous played golf but had to stop due to leg weakness.  He is a former smoking and quit more than 50 years ago.  He routinely has dentist visits every 6 months.  He is an occasional drinker.  He is agreeable to proceed with surgical intervention tomorrow with Dr. Kipp Brood.  Current Activity/ Functional Status: Patient is independent with mobility/ambulation, transfers, ADL's, IADL's.   Zubrod Score: At the time of surgery this patients most appropriate activity status/level should be described as: []     0    Normal activity, no symptoms []     1    Restricted in physical strenuous activity but ambulatory, able to do out light work [x]     2    Ambulatory and capable of self care, unable to do work activities, up and about                 more than 50%  Of the time                            []     3    Only limited self care, in bed greater than 50% of waking hours []     4    Completely disabled, no self care, confined to bed  or chair []     5    Moribund  Past Medical History:  Diagnosis Date   Arthritis    Cataract    Esophageal stricture    GERD (gastroesophageal reflux disease)    Hyperlipidemia    Hypertension    Liver disease    hepatitis   Severe aortic stenosis     Past Surgical History:  Procedure Laterality Date   cataract  2013   HEMORROIDECTOMY  2001   HERNIA REPAIR  2005   INTRAVASCULAR PRESSURE WIRE/FFR STUDY N/A 11/03/2021   Procedure: INTRAVASCULAR PRESSURE WIRE/FFR STUDY;  Surgeon: Burnell Blanks, MD;  Location: [facility];  Service: Cardiovascular;  Laterality: N/A;   RIGHT/LEFT HEART CATH AND CORONARY ANGIOGRAPHY N/A 11/03/2021   Procedure: RIGHT/LEFT HEART CATH AND CORONARY ANGIOGRAPHY;  Surgeon: Burnell Blanks, MD;  Location: [facility];  Service: Cardiovascular;  Laterality: N/A;   TONSILLECTOMY  1948     Social History   Tobacco Use  Smoking Status Former   Types: Cigarettes   Quit date: 1965   Years since quitting: 58.0  Smokeless Tobacco Never    Social History   Substance and Sexual Activity  Alcohol Use Yes   Alcohol/week: 21.0 standard drinks   Types: 21 Shots of liquor per week   Comment: daily a few cocktails   No Known Allergies  Current Facility-Administered Medications  Medication Dose Route Frequency Provider Last Rate Last Admin   0.9 %  sodium chloride infusion   Intravenous Continuous Burnell Blanks, MD 50 mL/hr at 11/03/21 1224 Rate Change at 11/03/21 1224   0.9 %  sodium chloride infusion  250 mL Intravenous PRN Burnell Blanks, MD       acetaminophen (TYLENOL) tablet 650 mg  650 mg Oral Q4H PRN Burnell Blanks, MD       [START ON 11/04/2021] aspirin EC tablet 81 mg  81 mg Oral Daily Burnell Blanks, MD       [START ON 11/04/2021] atorvastatin (LIPITOR) tablet 40 mg  40 mg Oral Daily Burnell Blanks, MD       hydrALAZINE (APRESOLINE) injection 10 mg  10 mg Intravenous Q20 Min PRN Burnell Blanks, MD       labetalol (NORMODYNE) injection 10 mg  10 mg Intravenous Q10 min PRN Burnell Blanks, MD       [START ON 11/04/2021] lisinopril (ZESTRIL) tablet 20 mg  20 mg Oral Daily Burnell Blanks, MD       ondansetron [facility]) injection 4 mg  4 mg Intravenous Q6H PRN Burnell Blanks, MD       sodium chloride flush (NS) 0.9 % injection 3 mL  3 mL Intravenous Q12H Burnell Blanks, MD       sodium chloride flush (NS) 0.9 % injection 3 mL  3 mL Intravenous PRN Burnell Blanks, MD        Medications Prior to Admission  Medication Sig Dispense Refill Last Dose   aspirin EC 81 MG tablet Take 1 tablet (81 mg total) by mouth daily. Swallow whole. 90 tablet 3 11/03/2021 at 0700   atorvastatin (LIPITOR) 20 MG tablet TAKE 1 TABLET  DAILY AT 6 PM. 90 tablet 1 11/03/2021   Glucosamine-Chondroit-Vit  C-Mn (GLUCOSAMINE 1500 COMPLEX PO) Take 2 tablets by mouth every other day.   11/02/2021   ketoconazole (NIZORAL) 2 % shampoo Apply 1 application topically once a week.  6 11/03/2021   lisinopril (ZESTRIL) 20 MG  tablet TAKE 1 TABLET EVERY DAY 90 tablet 2 11/03/2021   Multiple Vitamin (MULTIVITAMIN) tablet Take 1 tablet by mouth daily.   11/03/2021   naproxen sodium (ALEVE) 220 MG tablet Take 220 mg by mouth daily as needed (pain).   11/03/2021   omeprazole (PRILOSEC) 40 MG capsule TAKE 1 CAPSULE EVERY DAY 90 capsule 1 11/03/2021   vitamin B-12 (CYANOCOBALAMIN) 1000 MCG tablet Take 1,000 mcg by mouth daily.   11/03/2021   metoprolol tartrate (LOPRESSOR) 50 MG tablet Take one tablet by mouth prior to 1/13 CT scan 1 tablet 0 Unknown    Family History  Problem Relation Age of Onset   Heart failure Mother 31   Heart failure Father 47       Scarlet fever as child-Valve disease   Cancer Maternal Grandmother    Diabetes Child    Heart Problems Child 67       cardiac arrest   Review of Systems:     Cardiac Review of Systems: Y or  [    ]= no  Chest Pain [ N   ]  Resting SOB [ N  ] Exertional SOB  [ N ]  Orthopnea [  ]   Pedal Edema [ Y  ]    Palpitations [  ] Syncope  [ Y ]   Presyncope [   ]  General Review of Systems: [Y] = yes [  ]=no Constitional: recent weight change [  ]; anorexia [  ]; fatigue Aqua.Slicker  ]; nausea [ N ]; night sweats [  ]; fever [  ]; or chills [  ]                                                               Dental: Last Dentist visit: w/in the last 6 months  Eye : blurred vision [  ]; diplopia [   ]; vision changes [  ];  Amaurosis fugax[  ]; + cataract surgery Resp: cough [ N ];  wheezing[  ];  hemoptysis[  ]; shortness of breath[ N ]; paroxysmal nocturnal dyspnea[  ]; dyspnea on exertion[N  ]; or orthopnea[  ];  GI:  gallstones[  ], vomiting[ N ];  dysphagia[  ]; melena[  ];  hematochezia [  ]; heartburn[  ];   Hx of  Colonoscopy[  ]; GU: kidney stones [  ]; hematuria[  ];    dysuria [  ];  nocturia[  ];  history of     obstruction [  ]; urinary frequency [  ]             Skin: rash, swelling[  ];, hair loss[  ];  peripheral edema[ Y ];  or itching[  ]; Musculosketetal: myalgias[  ];  joint swelling[  ];  joint erythema[  ];  joint pain[  ];  back pain[  ];  Heme/Lymph: bruising[  ];  bleeding[  ];  anemia[  ];  Neuro: TIA[  ];  headaches[  ];  stroke[ N ];  vertigo[  ];  seizures[  ];   paresthesias[  ];  difficulty walking[ Y ];  Psych:depression[  ]; anxiety[  ];  Endocrine: diabetes[ N ];  thyroid dysfunction[  ];  Physical Exam: BP 128/70 (BP Location: Left Arm)  Pulse 74    Temp 98.2 F (36.8 C) (Oral)    Resp 18    Ht 5' 11.5" (1.816 m)    Wt 83.9 kg    SpO2 98%    BMI 25.44 kg/m   General appearance: alert, cooperative, and no distress Head: Normocephalic, without obvious abnormality, atraumatic Neck: no adenopathy, no carotid bruit, no JVD, supple, symmetrical, trachea midline, and thyroid not enlarged, symmetric, no tenderness/mass/nodules Resp: clear to auscultation bilaterally Cardio: regular rate and rhythm and systolic murmur: systolic ejection 5/6, blowing at 2nd right intercostal space GI: soft, non-tender; bowel sounds normal; no masses,  no organomegaly Extremities: extremities normal, atraumatic, no cyanosis or edema Neurologic: Grossly normal  Diagnostic Studies & Laboratory data:     Recent Radiology Findings:   CARDIAC CATHETERIZATION  Result Date: 11/03/2021   Mid RCA lesion is 99% stenosed.   Prox RCA lesion is 60% stenosed.   Mid LM lesion is 40% stenosed.   Ost LAD to Prox LAD lesion is 70% stenosed.   Prox LAD to Mid LAD lesion is 40% stenosed.   Prox Cx to Mid Cx lesion is 40% stenosed. Mild to moderate distal left main stenosis. Severe, heavily calcified ostial LAD stenosis (RFR 0.78 suggesting the lesion is flow limiting). Mild mid Circumflex stenosis Large dominant RCA with eccentric, nodular calcific lesion in the proximal  vessel. This is a moderate stenosis. The mid RCA has a severe heavily calcified stenosis. Normal right heart pressures Severe aortic stenosis by echo (aortic valve not crossed today) Recommendations: He has severe, heavily calcified CAD involving the ostial LAD and the mid RCA. PCI of the LAD lesion would be high risk. I have reviewed his films with our interventional and structural team and I think the best option is to consider CABG and AVR. Given recent syncope, will admit him to the telemetry unit and have CT surgery see him to review options for CABG with surgical AVR. He appears to be a good candidate for surgery.   VAS US DOPPLER PRE CABG  Result Date: 11/03/2021 PREOPERATIVE VASCULAR EVALUATION Patient Name:  Joshua [name]  Date of Exam:   11/03/2021 Medical Rec #: [id number]         Accession #:    [id number] Date of Birth: 03-21-1936        Patient Gender: M Patient Age:   64 years Exam Location:  [facility] Procedure:      VAS US DOPPLER PRE CABG Referring Phys: Gilford Raid --------------------------------------------------------------------------------  Indications:  Pre-CABG. Risk Factors: Hypertension, hyperlipidemia. Performing Technologist: Archie Patten RVS  Examination Guidelines: A complete evaluation includes B-mode imaging, spectral Doppler, color Doppler, and power Doppler as needed of all accessible portions of each vessel. Bilateral testing is considered an integral part of a complete examination. Limited examinations for reoccurring indications may be performed as noted.  Right Carotid Findings: +----------+--------+--------+--------+-------------------------+---------+             PSV cm/s EDV cm/s Stenosis Describe                  Comments   +----------+--------+--------+--------+-------------------------+---------+  CCA Prox   95       21                heterogenous                         +----------+--------+--------+--------+-------------------------+---------+  CCA  Distal 63       14  heterogenous and calcific            +----------+--------+--------+--------+-------------------------+---------+  ICA Prox   82       27       1-39%    heterogenous              Shadowing  +----------+--------+--------+--------+-------------------------+---------+  ICA Distal 96       27                                                     +----------+--------+--------+--------+-------------------------+---------+  ECA        75       7                                                      +----------+--------+--------+--------+-------------------------+---------+ +----------+--------+-------+--------+------------+             PSV cm/s EDV cms Describe Arm Pressure  +----------+--------+-------+--------+------------+  Subclavian 94                                      +----------+--------+-------+--------+------------+ +---------+--------+--+--------+-+---------+  Vertebral PSV cm/s 38 EDV cm/s 9 Antegrade  +---------+--------+--+--------+-+---------+ Left Carotid Findings: +----------+--------+--------+--------+------------+--------+             PSV cm/s EDV cm/s Stenosis Describe     Comments  +----------+--------+--------+--------+------------+--------+  CCA Prox   111      27                heterogenous           +----------+--------+--------+--------+------------+--------+  CCA Distal 70       17                heterogenous           +----------+--------+--------+--------+------------+--------+  ICA Prox   84       27       1-39%    heterogenous           +----------+--------+--------+--------+------------+--------+  ICA Distal 69       22                                       +----------+--------+--------+--------+------------+--------+  ECA        75       11                                       +----------+--------+--------+--------+------------+--------+ +----------+--------+--------+--------+------------+  Subclavian PSV cm/s EDV cm/s Describe Arm Pressure   +----------+--------+--------+--------+------------+             115                                      +----------+--------+--------+--------+------------+ +---------+--------+--+--------+--+---------+  Vertebral PSV cm/s 39 EDV cm/s 11 Antegrade  +---------+--------+--+--------+--+---------+  ABI Findings: +---------+------------------+-----+----------+--------+  Right     Rt Pressure (  mmHg) Index Waveform   Comment   +---------+------------------+-----+----------+--------+  Brachial  148                      triphasic            +---------+------------------+-----+----------+--------+  PTA       255                1.72  monophasic           +---------+------------------+-----+----------+--------+  DP        147                0.99  monophasic           +---------+------------------+-----+----------+--------+  Great Toe 88                 0.59                       +---------+------------------+-----+----------+--------+ +---------+------------------+-----+---------+-------+  Left      Lt Pressure (mmHg) Index Waveform  Comment  +---------+------------------+-----+---------+-------+  Brachial  131                      triphasic          +---------+------------------+-----+---------+-------+  PTA       171                1.16  biphasic           +---------+------------------+-----+---------+-------+  DP        123                0.83  biphasic           +---------+------------------+-----+---------+-------+  Great Toe 100                0.68                     +---------+------------------+-----+---------+-------+ +-------+---------------+----------------+  ABI/TBI Today's ABI/TBI Previous ABI/TBI  +-------+---------------+----------------+  Right   1.72            0.59              +-------+---------------+----------------+  Left    1.16            0.68              +-------+---------------+----------------+  Right Doppler Findings: +--------+--------+-----+---------+--------+  Site     Pressure Index Doppler    Comments  +--------+--------+-----+---------+--------+  Brachial 148            triphasic           +--------+--------+-----+---------+--------+  Radial                  triphasic           +--------+--------+-----+---------+--------+  Ulnar                   triphasic           +--------+--------+-----+---------+--------+  Left Doppler Findings: +--------+--------+-----+---------+--------+  Site     Pressure Index Doppler   Comments  +--------+--------+-----+---------+--------+  Brachial 131            triphasic           +--------+--------+-----+---------+--------+  Radial                  triphasic           +--------+--------+-----+---------+--------+  Ulnar  triphasic           +--------+--------+-----+---------+--------+  Summary: Right Carotid: Velocities in the right ICA are consistent with a 1-39% stenosis. Left Carotid: Velocities in the left ICA are consistent with a 1-39% stenosis. Vertebrals: Bilateral vertebral arteries demonstrate antegrade flow. Right ABI: Resting right ankle-brachial index indicates noncompressible right lower extremity arteries. The right toe-brachial index is abnormal. Left ABI: Resting left ankle-brachial index is within normal range. No evidence of significant left lower extremity arterial disease. The left toe-brachial index is abnormal. Right Upper Extremity: Unable to obtain allens test due to TR band. Left Upper Extremity: Doppler waveform obliterate with left radial compression. Doppler waveforms remain within normal limits with left ulnar compression.     Preliminary      I have independently reviewed the above radiologic studies and discussed with the patient   Recent Lab Findings: Lab Results  Component Value Date   WBC 8.6 10/29/2021   HGB 14.3 10/29/2021   HCT 40.4 10/29/2021   PLT 147 (L) 10/29/2021   GLUCOSE 133 (H) 10/29/2021   CHOL 195 10/24/2021   TRIG 76 10/24/2021   HDL 101 10/24/2021   LDLCALC 78 10/24/2021   ALT 26 10/24/2021    AST 50 (H) 10/24/2021   NA 137 10/29/2021   K 4.9 10/29/2021   CL 99 10/29/2021   CREATININE 1.17 10/29/2021   BUN 15 10/29/2021   CO2 24 10/29/2021   TSH 1.150 09/25/2021   INR 0.97 07/11/2016   HGBA1C 4.9 10/09/2020   Assessment / Plan:      CAD- severe 3V disease- denies chest pain, shortness of breath Severe Aortic Stenosis- unfortunately found to have CAD, will need conventional aortic valve replacement with tissue valve HTN HLD H/O PVCs Dispo- patient stable, currently chest pain free, denies shortness of breath.. presenting symptoms was syncope.  Was scheduled for TAVR 1/24, but was found to have 3V CAD today and will require coronary bypass grafting procedure and aortic valve replacement.  He has been seen regularly by a dentist.  For surgery for tomorrow afternoon.  Dr. Kipp Brood to evaluate patient this afternoon.  I  spent 55 minutes counseling the patient face to face.  Ellwood Handler, PA-C 11/03/2021 4:20 PM   86 yo male with severe aortic stenosis, and 3V CAD.  He is agreeable to proceed with surgical revascularization and valve replacement.  The risks and benefits  have been discussed, and he is scheduled for 11/04/21.  Joshua [name]

## 2021-11-03 NOTE — Progress Notes (Signed)
Pre cabg has been completed.   Preliminary results in CV Proc.   Joshua [name] 11/03/2021 3:44 PM

## 2021-11-03 NOTE — H&P (View-Only) (Signed)
[address]       [name] [address]             [phone]        Joshua [name] [facility] Medical Record #[id number] Date of Birth: [birth year]  Referring: Angelena Form Primary Care: Shelda Pal, DO Primary Cardiologist: O'Neal  Chief Complaint: Aortic Stenosis, 3V CAD  History of Present Illness:      Joshua [name] is an 86 yo male with history of PVC's, arthritis, GERD, HTN, HLD, Aortic Stenosis.  The patient was referred to Dr. Audie Box by his PCP after he suffered a syncopal episode.  The patient stated he was walking in his neighborhood when he felt light headed and ultimately passed out.  He denied seizure like activity and did not experience any loss of bladder/stool function.  His Aortic Stenosis was originally diagnosed in 2017, has not been followed up on since that time as he did not know this was required.  The patient also experiences lower extremity edema which has been present for the past several months.  Physical exam by Dr. Jenetta DownerNori Riis revealed a very loud murmur with an absent S2 and he was concerned he had progression to severe aortic stenosis.  Repeat Echocardiogram was obtained and showed a preserved EF of 60-65% with severe aortic stenosis with Mean gradient 58 mmHg, peak gradient 95.6 mmHg, AVA 0.79 cm2, DI 0.20.  His valve leaflets are also calcified and thickened with restricted leaflet motion.  He was referred to Dr. Angelena Form for possible TAVR procedure.  It was felt with recent syncope the patient should have expedited TAVR procedure.  He would require cardiac catheterization prior to proceeding.  This was performed today 1/16 and showed 3v CAD.  This was felt the patient would require coronary bypass grafting procedure and conventional Aortic Valve replacement and Cardiac surgery consultation was requested.  The patient is currently symptom free.  He is disappointed he is unable to have a TAVR procedure.  Prior to surgery he was not that  active due to personal choice.  He was able to ambulate to the mailbox without difficulty, however his son states he previous played golf but had to stop due to leg weakness.  He is a former smoking and quit more than 50 years ago.  He routinely has dentist visits every 6 months.  He is an occasional drinker.  He is agreeable to proceed with surgical intervention tomorrow with Dr. Kipp Brood.  Current Activity/ Functional Status: Patient is independent with mobility/ambulation, transfers, ADL's, IADL's.   Zubrod Score: At the time of surgery this patients most appropriate activity status/level should be described as: []     0    Normal activity, no symptoms []     1    Restricted in physical strenuous activity but ambulatory, able to do out light work [x]     2    Ambulatory and capable of self care, unable to do work activities, up and about                 more than 50%  Of the time                            []     3    Only limited self care, in bed greater than 50% of waking hours []     4    Completely disabled, no self care, confined to bed  or chair []     5    Moribund  Past Medical History:  Diagnosis Date   Arthritis    Cataract    Esophageal stricture    GERD (gastroesophageal reflux disease)    Hyperlipidemia    Hypertension    Liver disease    hepatitis   Severe aortic stenosis     Past Surgical History:  Procedure Laterality Date   cataract  2013   HEMORROIDECTOMY  2001   HERNIA REPAIR  2005   INTRAVASCULAR PRESSURE WIRE/FFR STUDY N/A 11/03/2021   Procedure: INTRAVASCULAR PRESSURE WIRE/FFR STUDY;  Surgeon: Burnell Blanks, MD;  Location: [facility];  Service: Cardiovascular;  Laterality: N/A;   RIGHT/LEFT HEART CATH AND CORONARY ANGIOGRAPHY N/A 11/03/2021   Procedure: RIGHT/LEFT HEART CATH AND CORONARY ANGIOGRAPHY;  Surgeon: Burnell Blanks, MD;  Location: [facility];  Service: Cardiovascular;  Laterality: N/A;   TONSILLECTOMY  1948     Social History   Tobacco Use  Smoking Status Former   Types: Cigarettes   Quit date: 1965   Years since quitting: 58.0  Smokeless Tobacco Never    Social History   Substance and Sexual Activity  Alcohol Use Yes   Alcohol/week: 21.0 standard drinks   Types: 21 Shots of liquor per week   Comment: daily a few cocktails   No Known Allergies  Current Facility-Administered Medications  Medication Dose Route Frequency Provider Last Rate Last Admin   0.9 %  sodium chloride infusion   Intravenous Continuous Burnell Blanks, MD 50 mL/hr at 11/03/21 1224 Rate Change at 11/03/21 1224   0.9 %  sodium chloride infusion  250 mL Intravenous PRN Burnell Blanks, MD       acetaminophen (TYLENOL) tablet 650 mg  650 mg Oral Q4H PRN Burnell Blanks, MD       [START ON 11/04/2021] aspirin EC tablet 81 mg  81 mg Oral Daily Burnell Blanks, MD       [START ON 11/04/2021] atorvastatin (LIPITOR) tablet 40 mg  40 mg Oral Daily Burnell Blanks, MD       hydrALAZINE (APRESOLINE) injection 10 mg  10 mg Intravenous Q20 Min PRN Burnell Blanks, MD       labetalol (NORMODYNE) injection 10 mg  10 mg Intravenous Q10 min PRN Burnell Blanks, MD       [START ON 11/04/2021] lisinopril (ZESTRIL) tablet 20 mg  20 mg Oral Daily Burnell Blanks, MD       ondansetron [facility]) injection 4 mg  4 mg Intravenous Q6H PRN Burnell Blanks, MD       sodium chloride flush (NS) 0.9 % injection 3 mL  3 mL Intravenous Q12H Burnell Blanks, MD       sodium chloride flush (NS) 0.9 % injection 3 mL  3 mL Intravenous PRN Burnell Blanks, MD        Medications Prior to Admission  Medication Sig Dispense Refill Last Dose   aspirin EC 81 MG tablet Take 1 tablet (81 mg total) by mouth daily. Swallow whole. 90 tablet 3 11/03/2021 at 0700   atorvastatin (LIPITOR) 20 MG tablet TAKE 1 TABLET  DAILY AT 6 PM. 90 tablet 1 11/03/2021   Glucosamine-Chondroit-Vit  C-Mn (GLUCOSAMINE 1500 COMPLEX PO) Take 2 tablets by mouth every other day.   11/02/2021   ketoconazole (NIZORAL) 2 % shampoo Apply 1 application topically once a week.  6 11/03/2021   lisinopril (ZESTRIL) 20 MG  tablet TAKE 1 TABLET EVERY DAY 90 tablet 2 11/03/2021   Multiple Vitamin (MULTIVITAMIN) tablet Take 1 tablet by mouth daily.   11/03/2021   naproxen sodium (ALEVE) 220 MG tablet Take 220 mg by mouth daily as needed (pain).   11/03/2021   omeprazole (PRILOSEC) 40 MG capsule TAKE 1 CAPSULE EVERY DAY 90 capsule 1 11/03/2021   vitamin B-12 (CYANOCOBALAMIN) 1000 MCG tablet Take 1,000 mcg by mouth daily.   11/03/2021   metoprolol tartrate (LOPRESSOR) 50 MG tablet Take one tablet by mouth prior to 1/13 CT scan 1 tablet 0 Unknown    Family History  Problem Relation Age of Onset   Heart failure Mother 82   Heart failure Father 57       Scarlet fever as child-Valve disease   Cancer Maternal Grandmother    Diabetes Child    Heart Problems Child 63       cardiac arrest   Review of Systems:     Cardiac Review of Systems: Y or  [    ]= no  Chest Pain [ N   ]  Resting SOB [ N  ] Exertional SOB  [ N ]  Orthopnea [  ]   Pedal Edema [ Y  ]    Palpitations [  ] Syncope  [ Y ]   Presyncope [   ]  General Review of Systems: [Y] = yes [  ]=no Constitional: recent weight change [  ]; anorexia [  ]; fatigue Aqua.Slicker  ]; nausea [ N ]; night sweats [  ]; fever [  ]; or chills [  ]                                                               Dental: Last Dentist visit: w/in the last 6 months  Eye : blurred vision [  ]; diplopia [   ]; vision changes [  ];  Amaurosis fugax[  ]; + cataract surgery Resp: cough [ N ];  wheezing[  ];  hemoptysis[  ]; shortness of breath[ N ]; paroxysmal nocturnal dyspnea[  ]; dyspnea on exertion[N  ]; or orthopnea[  ];  GI:  gallstones[  ], vomiting[ N ];  dysphagia[  ]; melena[  ];  hematochezia [  ]; heartburn[  ];   Hx of  Colonoscopy[  ]; GU: kidney stones [  ]; hematuria[  ];    dysuria [  ];  nocturia[  ];  history of     obstruction [  ]; urinary frequency [  ]             Skin: rash, swelling[  ];, hair loss[  ];  peripheral edema[ Y ];  or itching[  ]; Musculosketetal: myalgias[  ];  joint swelling[  ];  joint erythema[  ];  joint pain[  ];  back pain[  ];  Heme/Lymph: bruising[  ];  bleeding[  ];  anemia[  ];  Neuro: TIA[  ];  headaches[  ];  stroke[ N ];  vertigo[  ];  seizures[  ];   paresthesias[  ];  difficulty walking[ Y ];  Psych:depression[  ]; anxiety[  ];  Endocrine: diabetes[ N ];  thyroid dysfunction[  ];  Physical Exam: BP 128/70 (BP Location: Left Arm)  Pulse 74    Temp 98.2 F (36.8 C) (Oral)    Resp 18    Ht 5' 11.5" (1.816 m)    Wt 83.9 kg    SpO2 98%    BMI 25.44 kg/m   General appearance: alert, cooperative, and no distress Head: Normocephalic, without obvious abnormality, atraumatic Neck: no adenopathy, no carotid bruit, no JVD, supple, symmetrical, trachea midline, and thyroid not enlarged, symmetric, no tenderness/mass/nodules Resp: clear to auscultation bilaterally Cardio: regular rate and rhythm and systolic murmur: systolic ejection 5/6, blowing at 2nd right intercostal space GI: soft, non-tender; bowel sounds normal; no masses,  no organomegaly Extremities: extremities normal, atraumatic, no cyanosis or edema Neurologic: Grossly normal  Diagnostic Studies & Laboratory data:     Recent Radiology Findings:   CARDIAC CATHETERIZATION  Result Date: 11/03/2021   Mid RCA lesion is 99% stenosed.   Prox RCA lesion is 60% stenosed.   Mid LM lesion is 40% stenosed.   Ost LAD to Prox LAD lesion is 70% stenosed.   Prox LAD to Mid LAD lesion is 40% stenosed.   Prox Cx to Mid Cx lesion is 40% stenosed. Mild to moderate distal left main stenosis. Severe, heavily calcified ostial LAD stenosis (RFR 0.78 suggesting the lesion is flow limiting). Mild mid Circumflex stenosis Large dominant RCA with eccentric, nodular calcific lesion in the proximal  vessel. This is a moderate stenosis. The mid RCA has a severe heavily calcified stenosis. Normal right heart pressures Severe aortic stenosis by echo (aortic valve not crossed today) Recommendations: He has severe, heavily calcified CAD involving the ostial LAD and the mid RCA. PCI of the LAD lesion would be high risk. I have reviewed his films with our interventional and structural team and I think the best option is to consider CABG and AVR. Given recent syncope, will admit him to the telemetry unit and have CT surgery see him to review options for CABG with surgical AVR. He appears to be a good candidate for surgery.   VAS US DOPPLER PRE CABG  Result Date: 11/03/2021 PREOPERATIVE VASCULAR EVALUATION Patient Name:  Joshua [name]  Date of Exam:   11/03/2021 Medical Rec #: [id number]         Accession #:    [id number] Date of Birth: Jul 09, 1936        Patient Gender: M Patient Age:   39 years Exam Location:  [facility] Procedure:      VAS US DOPPLER PRE CABG Referring Phys: Gilford Raid --------------------------------------------------------------------------------  Indications:  Pre-CABG. Risk Factors: Hypertension, hyperlipidemia. Performing Technologist: Archie Patten RVS  Examination Guidelines: A complete evaluation includes B-mode imaging, spectral Doppler, color Doppler, and power Doppler as needed of all accessible portions of each vessel. Bilateral testing is considered an integral part of a complete examination. Limited examinations for reoccurring indications may be performed as noted.  Right Carotid Findings: +----------+--------+--------+--------+-------------------------+---------+             PSV cm/s EDV cm/s Stenosis Describe                  Comments   +----------+--------+--------+--------+-------------------------+---------+  CCA Prox   95       21                heterogenous                         +----------+--------+--------+--------+-------------------------+---------+  CCA  Distal 63       14  heterogenous and calcific            +----------+--------+--------+--------+-------------------------+---------+  ICA Prox   82       27       1-39%    heterogenous              Shadowing  +----------+--------+--------+--------+-------------------------+---------+  ICA Distal 96       27                                                     +----------+--------+--------+--------+-------------------------+---------+  ECA        75       7                                                      +----------+--------+--------+--------+-------------------------+---------+ +----------+--------+-------+--------+------------+             PSV cm/s EDV cms Describe Arm Pressure  +----------+--------+-------+--------+------------+  Subclavian 94                                      +----------+--------+-------+--------+------------+ +---------+--------+--+--------+-+---------+  Vertebral PSV cm/s 38 EDV cm/s 9 Antegrade  +---------+--------+--+--------+-+---------+ Left Carotid Findings: +----------+--------+--------+--------+------------+--------+             PSV cm/s EDV cm/s Stenosis Describe     Comments  +----------+--------+--------+--------+------------+--------+  CCA Prox   111      27                heterogenous           +----------+--------+--------+--------+------------+--------+  CCA Distal 70       17                heterogenous           +----------+--------+--------+--------+------------+--------+  ICA Prox   84       27       1-39%    heterogenous           +----------+--------+--------+--------+------------+--------+  ICA Distal 69       22                                       +----------+--------+--------+--------+------------+--------+  ECA        75       11                                       +----------+--------+--------+--------+------------+--------+ +----------+--------+--------+--------+------------+  Subclavian PSV cm/s EDV cm/s Describe Arm Pressure   +----------+--------+--------+--------+------------+             115                                      +----------+--------+--------+--------+------------+ +---------+--------+--+--------+--+---------+  Vertebral PSV cm/s 39 EDV cm/s 11 Antegrade  +---------+--------+--+--------+--+---------+  ABI Findings: +---------+------------------+-----+----------+--------+  Right     Rt Pressure (  mmHg) Index Waveform   Comment   +---------+------------------+-----+----------+--------+  Brachial  148                      triphasic            +---------+------------------+-----+----------+--------+  PTA       255                1.72  monophasic           +---------+------------------+-----+----------+--------+  DP        147                0.99  monophasic           +---------+------------------+-----+----------+--------+  Great Toe 88                 0.59                       +---------+------------------+-----+----------+--------+ +---------+------------------+-----+---------+-------+  Left      Lt Pressure (mmHg) Index Waveform  Comment  +---------+------------------+-----+---------+-------+  Brachial  131                      triphasic          +---------+------------------+-----+---------+-------+  PTA       171                1.16  biphasic           +---------+------------------+-----+---------+-------+  DP        123                0.83  biphasic           +---------+------------------+-----+---------+-------+  Great Toe 100                0.68                     +---------+------------------+-----+---------+-------+ +-------+---------------+----------------+  ABI/TBI Today's ABI/TBI Previous ABI/TBI  +-------+---------------+----------------+  Right   1.72            0.59              +-------+---------------+----------------+  Left    1.16            0.68              +-------+---------------+----------------+  Right Doppler Findings: +--------+--------+-----+---------+--------+  Site     Pressure Index Doppler    Comments  +--------+--------+-----+---------+--------+  Brachial 148            triphasic           +--------+--------+-----+---------+--------+  Radial                  triphasic           +--------+--------+-----+---------+--------+  Ulnar                   triphasic           +--------+--------+-----+---------+--------+  Left Doppler Findings: +--------+--------+-----+---------+--------+  Site     Pressure Index Doppler   Comments  +--------+--------+-----+---------+--------+  Brachial 131            triphasic           +--------+--------+-----+---------+--------+  Radial                  triphasic           +--------+--------+-----+---------+--------+  Ulnar  triphasic           +--------+--------+-----+---------+--------+  Summary: Right Carotid: Velocities in the right ICA are consistent with a 1-39% stenosis. Left Carotid: Velocities in the left ICA are consistent with a 1-39% stenosis. Vertebrals: Bilateral vertebral arteries demonstrate antegrade flow. Right ABI: Resting right ankle-brachial index indicates noncompressible right lower extremity arteries. The right toe-brachial index is abnormal. Left ABI: Resting left ankle-brachial index is within normal range. No evidence of significant left lower extremity arterial disease. The left toe-brachial index is abnormal. Right Upper Extremity: Unable to obtain allens test due to TR band. Left Upper Extremity: Doppler waveform obliterate with left radial compression. Doppler waveforms remain within normal limits with left ulnar compression.     Preliminary      I have independently reviewed the above radiologic studies and discussed with the patient   Recent Lab Findings: Lab Results  Component Value Date   WBC 8.6 10/29/2021   HGB 14.3 10/29/2021   HCT 40.4 10/29/2021   PLT 147 (L) 10/29/2021   GLUCOSE 133 (H) 10/29/2021   CHOL 195 10/24/2021   TRIG 76 10/24/2021   HDL 101 10/24/2021   LDLCALC 78 10/24/2021   ALT 26 10/24/2021    AST 50 (H) 10/24/2021   NA 137 10/29/2021   K 4.9 10/29/2021   CL 99 10/29/2021   CREATININE 1.17 10/29/2021   BUN 15 10/29/2021   CO2 24 10/29/2021   TSH 1.150 09/25/2021   INR 0.97 07/11/2016   HGBA1C 4.9 10/09/2020   Assessment / Plan:      CAD- severe 3V disease- denies chest pain, shortness of breath Severe Aortic Stenosis- unfortunately found to have CAD, will need conventional aortic valve replacement with tissue valve HTN HLD H/O PVCs Dispo- patient stable, currently chest pain free, denies shortness of breath.. presenting symptoms was syncope.  Was scheduled for TAVR 1/24, but was found to have 3V CAD today and will require coronary bypass grafting procedure and aortic valve replacement.  He has been seen regularly by a dentist.  For surgery for tomorrow afternoon.  Dr. Kipp Brood to evaluate patient this afternoon.  I  spent 55 minutes counseling the patient face to face.  Ellwood Handler, PA-C 11/03/2021 4:20 PM   86 yo male with severe aortic stenosis, and 3V CAD.  He is agreeable to proceed with surgical revascularization and valve replacement.  The risks and benefits  have been discussed, and he is scheduled for 11/04/21.  Kashmir Leedy Bary Leriche

## 2021-11-04 ENCOUNTER — Inpatient Hospital Stay (HOSPITAL_COMMUNITY)
Admission: AD | Disposition: A | Discharge status: Skilled Nursing Facility | Payer: Self-pay | Source: Home / Self Care | Attending: Thoracic Surgery (Cardiothoracic Vascular Surgery)

## 2021-11-04 ENCOUNTER — Inpatient Hospital Stay (HOSPITAL_COMMUNITY): Payer: Medicare HMO

## 2021-11-04 ENCOUNTER — Encounter (HOSPITAL_COMMUNITY): Payer: Self-pay | Admitting: Cardiovascular Disease

## 2021-11-04 DIAGNOSIS — Z951 Presence of aortocoronary bypass graft: Secondary | ICD-10-CM

## 2021-11-04 DIAGNOSIS — Z953 Presence of xenogenic heart valve: Secondary | ICD-10-CM

## 2021-11-04 DIAGNOSIS — Z952 Presence of prosthetic heart valve: Secondary | ICD-10-CM

## 2021-11-04 DIAGNOSIS — I2510 Atherosclerotic heart disease of native coronary artery without angina pectoris: Secondary | ICD-10-CM

## 2021-11-04 DIAGNOSIS — I350 Nonrheumatic aortic (valve) stenosis: Secondary | ICD-10-CM

## 2021-11-04 HISTORY — PX: CORONARY ARTERY BYPASS GRAFT: SHX141

## 2021-11-04 HISTORY — PX: ENDOVEIN HARVEST OF GREATER SAPHENOUS VEIN: SHX5059

## 2021-11-04 HISTORY — PX: AORTIC VALVE REPLACEMENT: SHX41

## 2021-11-04 HISTORY — PX: TEE WITHOUT CARDIOVERSION: SHX5443

## 2021-11-04 LAB — PROTIME-INR
INR: 1.9 — ABNORMAL HIGH (ref 0.8–1.2)
Prothrombin Time: 21.5 seconds — ABNORMAL HIGH (ref 11.4–15.2)

## 2021-11-04 LAB — POCT I-STAT, CHEM 8
BUN: 10 mg/dL (ref 8–23)
BUN: 9 mg/dL (ref 8–23)
BUN: 8 mg/dL (ref 8–23)
BUN: 8 mg/dL (ref 8–23)
BUN: 8 mg/dL (ref 8–23)
BUN: 7 mg/dL — ABNORMAL LOW (ref 8–23)
Calcium, Ion: 1.24 mmol/L (ref 1.15–1.40)
Calcium, Ion: 1.22 mmol/L (ref 1.15–1.40)
Calcium, Ion: 0.98 mmol/L — ABNORMAL LOW (ref 1.15–1.40)
Calcium, Ion: 0.78 mmol/L — CL (ref 1.15–1.40)
Calcium, Ion: 0.84 mmol/L — CL (ref 1.15–1.40)
Calcium, Ion: 0.97 mmol/L — ABNORMAL LOW (ref 1.15–1.40)
Chloride: 101 mmol/L (ref 98–111)
Chloride: 103 mmol/L (ref 98–111)
Chloride: 99 mmol/L (ref 98–111)
Chloride: 97 mmol/L — ABNORMAL LOW (ref 98–111)
Chloride: 97 mmol/L — ABNORMAL LOW (ref 98–111)
Chloride: 99 mmol/L (ref 98–111)
Creatinine, Ser: 0.7 mg/dL (ref 0.61–1.24)
Creatinine, Ser: 0.6 mg/dL — ABNORMAL LOW (ref 0.61–1.24)
Creatinine, Ser: 0.5 mg/dL — ABNORMAL LOW (ref 0.61–1.24)
Creatinine, Ser: 0.5 mg/dL — ABNORMAL LOW (ref 0.61–1.24)
Creatinine, Ser: 0.4 mg/dL — ABNORMAL LOW (ref 0.61–1.24)
Creatinine, Ser: 0.5 mg/dL — ABNORMAL LOW (ref 0.61–1.24)
Glucose, Bld: 116 mg/dL — ABNORMAL HIGH (ref 70–99)
Glucose, Bld: 93 mg/dL (ref 70–99)
Glucose, Bld: 97 mg/dL (ref 70–99)
Glucose, Bld: 111 mg/dL — ABNORMAL HIGH (ref 70–99)
Glucose, Bld: 91 mg/dL (ref 70–99)
Glucose, Bld: 130 mg/dL — ABNORMAL HIGH (ref 70–99)
HCT: 34 % — ABNORMAL LOW (ref 39.0–52.0)
HCT: 30 % — ABNORMAL LOW (ref 39.0–52.0)
HCT: 22 % — ABNORMAL LOW (ref 39.0–52.0)
HCT: 24 % — ABNORMAL LOW (ref 39.0–52.0)
HCT: 21 % — ABNORMAL LOW (ref 39.0–52.0)
HCT: 24 % — ABNORMAL LOW (ref 39.0–52.0)
Hemoglobin: 11.6 g/dL — ABNORMAL LOW (ref 13.0–17.0)
Hemoglobin: 10.2 g/dL — ABNORMAL LOW (ref 13.0–17.0)
Hemoglobin: 7.5 g/dL — ABNORMAL LOW (ref 13.0–17.0)
Hemoglobin: 8.2 g/dL — ABNORMAL LOW (ref 13.0–17.0)
Hemoglobin: 7.1 g/dL — ABNORMAL LOW (ref 13.0–17.0)
Hemoglobin: 8.2 g/dL — ABNORMAL LOW (ref 13.0–17.0)
Potassium: 3.7 mmol/L (ref 3.5–5.1)
Potassium: 3.8 mmol/L (ref 3.5–5.1)
Potassium: 4.4 mmol/L (ref 3.5–5.1)
Potassium: 4.9 mmol/L (ref 3.5–5.1)
Potassium: 4.6 mmol/L (ref 3.5–5.1)
Potassium: 4.2 mmol/L (ref 3.5–5.1)
Sodium: 136 mmol/L (ref 135–145)
Sodium: 136 mmol/L (ref 135–145)
Sodium: 136 mmol/L (ref 135–145)
Sodium: 136 mmol/L (ref 135–145)
Sodium: 137 mmol/L (ref 135–145)
Sodium: 137 mmol/L (ref 135–145)
TCO2: 26 mmol/L (ref 22–32)
TCO2: 24 mmol/L (ref 22–32)
TCO2: 26 mmol/L (ref 22–32)
TCO2: 25 mmol/L (ref 22–32)
TCO2: 27 mmol/L (ref 22–32)
TCO2: 26 mmol/L (ref 22–32)

## 2021-11-04 LAB — CBC
HCT: 35.1 % — ABNORMAL LOW (ref 39.0–52.0)
HCT: 30.1 % — ABNORMAL LOW (ref 39.0–52.0)
HCT: 19.6 % — ABNORMAL LOW (ref 39.0–52.0)
Hemoglobin: 12.6 g/dL — ABNORMAL LOW (ref 13.0–17.0)
Hemoglobin: 10.7 g/dL — ABNORMAL LOW (ref 13.0–17.0)
Hemoglobin: 7 g/dL — ABNORMAL LOW (ref 13.0–17.0)
MCH: 34.8 pg — ABNORMAL HIGH (ref 26.0–34.0)
MCH: 33.9 pg (ref 26.0–34.0)
MCH: 34 pg (ref 26.0–34.0)
MCHC: 35.9 g/dL (ref 30.0–36.0)
MCHC: 35.5 g/dL (ref 30.0–36.0)
MCHC: 35.7 g/dL (ref 30.0–36.0)
MCV: 97 fL (ref 80.0–100.0)
MCV: 95.3 fL (ref 80.0–100.0)
MCV: 95.1 fL (ref 80.0–100.0)
Platelets: 122 10*3/uL — ABNORMAL LOW (ref 150–400)
Platelets: 73 10*3/uL — ABNORMAL LOW (ref 150–400)
Platelets: 104 10*3/uL — ABNORMAL LOW (ref 150–400)
RBC: 3.62 MIL/uL — ABNORMAL LOW (ref 4.22–5.81)
RBC: 3.16 MIL/uL — ABNORMAL LOW (ref 4.22–5.81)
RBC: 2.06 MIL/uL — ABNORMAL LOW (ref 4.22–5.81)
RDW: 11.8 % (ref 11.5–15.5)
RDW: 14.6 % (ref 11.5–15.5)
RDW: 15.1 % (ref 11.5–15.5)
WBC: 5.6 10*3/uL (ref 4.0–10.5)
WBC: 10.9 10*3/uL — ABNORMAL HIGH (ref 4.0–10.5)
WBC: 10.1 10*3/uL (ref 4.0–10.5)
nRBC: 0 % (ref 0.0–0.2)
nRBC: 0 % (ref 0.0–0.2)
nRBC: 0 % (ref 0.0–0.2)

## 2021-11-04 LAB — POCT I-STAT 7, (LYTES, BLD GAS, ICA,H+H)
Acid-Base Excess: 4 mmol/L — ABNORMAL HIGH (ref 0.0–2.0)
Acid-Base Excess: 3 mmol/L — ABNORMAL HIGH (ref 0.0–2.0)
Acid-Base Excess: 4 mmol/L — ABNORMAL HIGH (ref 0.0–2.0)
Acid-Base Excess: 2 mmol/L (ref 0.0–2.0)
Acid-Base Excess: 0 mmol/L (ref 0.0–2.0)
Acid-Base Excess: 1 mmol/L (ref 0.0–2.0)
Acid-Base Excess: 1 mmol/L (ref 0.0–2.0)
Acid-base deficit: 1 mmol/L (ref 0.0–2.0)
Acid-base deficit: 4 mmol/L — ABNORMAL HIGH (ref 0.0–2.0)
Acid-base deficit: 2 mmol/L (ref 0.0–2.0)
Bicarbonate: 28.2 mmol/L — ABNORMAL HIGH (ref 20.0–28.0)
Bicarbonate: 25.9 mmol/L (ref 20.0–28.0)
Bicarbonate: 27.6 mmol/L (ref 20.0–28.0)
Bicarbonate: 25.3 mmol/L (ref 20.0–28.0)
Bicarbonate: 26.5 mmol/L (ref 20.0–28.0)
Bicarbonate: 25.4 mmol/L (ref 20.0–28.0)
Bicarbonate: 26.1 mmol/L (ref 20.0–28.0)
Bicarbonate: 25 mmol/L (ref 20.0–28.0)
Bicarbonate: 21.5 mmol/L (ref 20.0–28.0)
Bicarbonate: 22.5 mmol/L (ref 20.0–28.0)
Calcium, Ion: 1.06 mmol/L — ABNORMAL LOW (ref 1.15–1.40)
Calcium, Ion: 0.98 mmol/L — ABNORMAL LOW (ref 1.15–1.40)
Calcium, Ion: 1.02 mmol/L — ABNORMAL LOW (ref 1.15–1.40)
Calcium, Ion: 0.8 mmol/L — CL (ref 1.15–1.40)
Calcium, Ion: 0.9 mmol/L — ABNORMAL LOW (ref 1.15–1.40)
Calcium, Ion: 0.75 mmol/L — CL (ref 1.15–1.40)
Calcium, Ion: 0.84 mmol/L — CL (ref 1.15–1.40)
Calcium, Ion: 0.97 mmol/L — ABNORMAL LOW (ref 1.15–1.40)
Calcium, Ion: 0.97 mmol/L — ABNORMAL LOW (ref 1.15–1.40)
Calcium, Ion: 0.96 mmol/L — ABNORMAL LOW (ref 1.15–1.40)
HCT: 24 % — ABNORMAL LOW (ref 39.0–52.0)
HCT: 24 % — ABNORMAL LOW (ref 39.0–52.0)
HCT: 26 % — ABNORMAL LOW (ref 39.0–52.0)
HCT: 23 % — ABNORMAL LOW (ref 39.0–52.0)
HCT: 24 % — ABNORMAL LOW (ref 39.0–52.0)
HCT: 21 % — ABNORMAL LOW (ref 39.0–52.0)
HCT: 22 % — ABNORMAL LOW (ref 39.0–52.0)
HCT: 24 % — ABNORMAL LOW (ref 39.0–52.0)
HCT: 28 % — ABNORMAL LOW (ref 39.0–52.0)
HCT: 17 % — ABNORMAL LOW (ref 39.0–52.0)
Hemoglobin: 8.2 g/dL — ABNORMAL LOW (ref 13.0–17.0)
Hemoglobin: 8.2 g/dL — ABNORMAL LOW (ref 13.0–17.0)
Hemoglobin: 8.8 g/dL — ABNORMAL LOW (ref 13.0–17.0)
Hemoglobin: 7.8 g/dL — ABNORMAL LOW (ref 13.0–17.0)
Hemoglobin: 8.2 g/dL — ABNORMAL LOW (ref 13.0–17.0)
Hemoglobin: 7.1 g/dL — ABNORMAL LOW (ref 13.0–17.0)
Hemoglobin: 7.5 g/dL — ABNORMAL LOW (ref 13.0–17.0)
Hemoglobin: 8.2 g/dL — ABNORMAL LOW (ref 13.0–17.0)
Hemoglobin: 9.5 g/dL — ABNORMAL LOW (ref 13.0–17.0)
Hemoglobin: 5.8 g/dL — CL (ref 13.0–17.0)
O2 Saturation: 100 %
O2 Saturation: 100 %
O2 Saturation: 100 %
O2 Saturation: 100 %
O2 Saturation: 100 %
O2 Saturation: 100 %
O2 Saturation: 100 %
O2 Saturation: 100 %
O2 Saturation: 89 %
O2 Saturation: 100 %
Patient temperature: 98
Patient temperature: 97.5
Potassium: 3.7 mmol/L (ref 3.5–5.1)
Potassium: 4.2 mmol/L (ref 3.5–5.1)
Potassium: 4.4 mmol/L (ref 3.5–5.1)
Potassium: 4.5 mmol/L (ref 3.5–5.1)
Potassium: 5 mmol/L (ref 3.5–5.1)
Potassium: 4.6 mmol/L (ref 3.5–5.1)
Potassium: 5 mmol/L (ref 3.5–5.1)
Potassium: 4.2 mmol/L (ref 3.5–5.1)
Potassium: 4.1 mmol/L (ref 3.5–5.1)
Potassium: 4.1 mmol/L (ref 3.5–5.1)
Sodium: 136 mmol/L (ref 135–145)
Sodium: 134 mmol/L — ABNORMAL LOW (ref 135–145)
Sodium: 136 mmol/L (ref 135–145)
Sodium: 136 mmol/L (ref 135–145)
Sodium: 136 mmol/L (ref 135–145)
Sodium: 137 mmol/L (ref 135–145)
Sodium: 137 mmol/L (ref 135–145)
Sodium: 137 mmol/L (ref 135–145)
Sodium: 139 mmol/L (ref 135–145)
Sodium: 139 mmol/L (ref 135–145)
TCO2: 29 mmol/L (ref 22–32)
TCO2: 27 mmol/L (ref 22–32)
TCO2: 29 mmol/L (ref 22–32)
TCO2: 27 mmol/L (ref 22–32)
TCO2: 28 mmol/L (ref 22–32)
TCO2: 27 mmol/L (ref 22–32)
TCO2: 27 mmol/L (ref 22–32)
TCO2: 26 mmol/L (ref 22–32)
TCO2: 23 mmol/L (ref 22–32)
TCO2: 24 mmol/L (ref 22–32)
pCO2 arterial: 38.9 mmHg (ref 32.0–48.0)
pCO2 arterial: 33 mmHg (ref 32.0–48.0)
pCO2 arterial: 36.8 mmHg (ref 32.0–48.0)
pCO2 arterial: 42.5 mmHg (ref 32.0–48.0)
pCO2 arterial: 53.7 mmHg — ABNORMAL HIGH (ref 32.0–48.0)
pCO2 arterial: 40.7 mmHg (ref 32.0–48.0)
pCO2 arterial: 46.1 mmHg (ref 32.0–48.0)
pCO2 arterial: 35 mmHg (ref 32.0–48.0)
pCO2 arterial: 37.5 mmHg (ref 32.0–48.0)
pCO2 arterial: 37.3 mmHg (ref 32.0–48.0)
pH, Arterial: 7.467 — ABNORMAL HIGH (ref 7.350–7.450)
pH, Arterial: 7.483 — ABNORMAL HIGH (ref 7.350–7.450)
pH, Arterial: 7.502 — ABNORMAL HIGH (ref 7.350–7.450)
pH, Arterial: 7.281 — ABNORMAL LOW (ref 7.350–7.450)
pH, Arterial: 7.404 (ref 7.350–7.450)
pH, Arterial: 7.349 — ABNORMAL LOW (ref 7.350–7.450)
pH, Arterial: 7.416 (ref 7.350–7.450)
pH, Arterial: 7.462 — ABNORMAL HIGH (ref 7.350–7.450)
pH, Arterial: 7.365 (ref 7.350–7.450)
pH, Arterial: 7.385 (ref 7.350–7.450)
pO2, Arterial: 350 mmHg — ABNORMAL HIGH (ref 83.0–108.0)
pO2, Arterial: 283 mmHg — ABNORMAL HIGH (ref 83.0–108.0)
pO2, Arterial: 290 mmHg — ABNORMAL HIGH (ref 83.0–108.0)
pO2, Arterial: 213 mmHg — ABNORMAL HIGH (ref 83.0–108.0)
pO2, Arterial: 248 mmHg — ABNORMAL HIGH (ref 83.0–108.0)
pO2, Arterial: 265 mmHg — ABNORMAL HIGH (ref 83.0–108.0)
pO2, Arterial: 265 mmHg — ABNORMAL HIGH (ref 83.0–108.0)
pO2, Arterial: 209 mmHg — ABNORMAL HIGH (ref 83.0–108.0)
pO2, Arterial: 57 mmHg — ABNORMAL LOW (ref 83.0–108.0)
pO2, Arterial: 228 mmHg — ABNORMAL HIGH (ref 83.0–108.0)

## 2021-11-04 LAB — POCT I-STAT EG7
Acid-Base Excess: 2 mmol/L (ref 0.0–2.0)
Bicarbonate: 26.3 mmol/L (ref 20.0–28.0)
Calcium, Ion: 1.05 mmol/L — ABNORMAL LOW (ref 1.15–1.40)
HCT: 23 % — ABNORMAL LOW (ref 39.0–52.0)
Hemoglobin: 7.8 g/dL — ABNORMAL LOW (ref 13.0–17.0)
O2 Saturation: 85 %
Potassium: 4.8 mmol/L (ref 3.5–5.1)
Sodium: 136 mmol/L (ref 135–145)
TCO2: 27 mmol/L (ref 22–32)
pCO2, Ven: 38.5 mmHg — ABNORMAL LOW (ref 44.0–60.0)
pH, Ven: 7.443 — ABNORMAL HIGH (ref 7.250–7.430)
pO2, Ven: 49 mmHg — ABNORMAL HIGH (ref 32.0–45.0)

## 2021-11-04 LAB — SURGICAL PCR SCREEN
MRSA, PCR: NEGATIVE
Staphylococcus aureus: NEGATIVE

## 2021-11-04 LAB — BASIC METABOLIC PANEL
Anion gap: 9 (ref 5–15)
BUN: 11 mg/dL (ref 8–23)
CO2: 24 mmol/L (ref 22–32)
Calcium: 9 mg/dL (ref 8.9–10.3)
Chloride: 103 mmol/L (ref 98–111)
Creatinine, Ser: 0.93 mg/dL (ref 0.61–1.24)
GFR, Estimated: >60 mL/min (ref >60)
Glucose, Bld: 96 mg/dL (ref 70–99)
Potassium: 4 mmol/L (ref 3.5–5.1)
Sodium: 136 mmol/L (ref 135–145)

## 2021-11-04 LAB — GLUCOSE, CAPILLARY
Glucose-Capillary: 101 mg/dL — ABNORMAL HIGH (ref 70–99)
Glucose-Capillary: 133 mg/dL — ABNORMAL HIGH (ref 70–99)
Glucose-Capillary: 149 mg/dL — ABNORMAL HIGH (ref 70–99)
Glucose-Capillary: 155 mg/dL — ABNORMAL HIGH (ref 70–99)
Glucose-Capillary: 176 mg/dL — ABNORMAL HIGH (ref 70–99)

## 2021-11-04 LAB — APTT: aPTT: 40 seconds — ABNORMAL HIGH (ref 24–36)

## 2021-11-04 LAB — PREPARE RBC (CROSSMATCH)

## 2021-11-04 LAB — HEMOGLOBIN AND HEMATOCRIT, BLOOD
HCT: 23.5 % — ABNORMAL LOW (ref 39.0–52.0)
Hemoglobin: 8.7 g/dL — ABNORMAL LOW (ref 13.0–17.0)

## 2021-11-04 LAB — PLATELET COUNT: Platelets: 79 10*3/uL — ABNORMAL LOW (ref 150–400)

## 2021-11-04 SURGERY — CORONARY ARTERY BYPASS GRAFTING (CABG)
Anesthesia: General | Site: Leg Lower | Laterality: Right

## 2021-11-04 MED ORDER — PHENYLEPHRINE 40 MCG/ML (10ML) SYRINGE FOR IV PUSH (FOR BLOOD PRESSURE SUPPORT): PREFILLED_SYRINGE | INTRAVENOUS | Status: DC | PRN

## 2021-11-04 MED ORDER — METOPROLOL TARTRATE 5 MG/5ML IV SOLN: 2.5000 mg | INTRAVENOUS | Status: DC | PRN

## 2021-11-04 MED ORDER — ACETAMINOPHEN 160 MG/5ML PO SOLN
1000.0000 mg | Freq: Four times a day (QID) | ORAL | Status: AC
  Administered 2021-11-04 – 2021-11-06 (×5): 1000 mg
  Filled 2021-11-04 (×5): qty 40.6

## 2021-11-04 MED ORDER — FENTANYL CITRATE (PF) 250 MCG/5ML IJ SOLN
INTRAMUSCULAR | Status: AC
  Filled 2021-11-04: qty 5

## 2021-11-04 MED ORDER — MIDAZOLAM HCL 2 MG/2ML IJ SOLN
INTRAMUSCULAR | Status: AC
  Administered 2021-11-05: 2 mg via INTRAVENOUS
  Filled 2021-11-04: qty 2

## 2021-11-04 MED ORDER — EPINEPHRINE HCL 5 MG/250ML IV SOLN IN NS
0.0000 ug/min | INTRAVENOUS | Status: DC
  Administered 2021-11-05: 0.5 ug/min via INTRAVENOUS
  Filled 2021-11-04: qty 250

## 2021-11-04 MED ORDER — LACTATED RINGERS IV SOLN: INTRAVENOUS | Status: DC | PRN

## 2021-11-04 MED ORDER — 0.9 % SODIUM CHLORIDE (POUR BTL) OPTIME
TOPICAL | Status: DC | PRN
  Administered 2021-11-04: 5000 mL

## 2021-11-04 MED ORDER — DOCUSATE SODIUM 100 MG PO CAPS
200.0000 mg | ORAL_CAPSULE | Freq: Every day | ORAL | Status: DC
  Administered 2021-11-06 – 2021-11-17 (×10): 200 mg via ORAL
  Filled 2021-11-04 (×12): qty 2

## 2021-11-04 MED ORDER — FAMOTIDINE IN NACL 20-0.9 MG/50ML-% IV SOLN
20.0000 mg | Freq: Two times a day (BID) | INTRAVENOUS | Status: AC
  Administered 2021-11-04: 20 mg via INTRAVENOUS
  Filled 2021-11-04: qty 50

## 2021-11-04 MED ORDER — TRAMADOL HCL 50 MG PO TABS
50.0000 mg | ORAL_TABLET | ORAL | Status: DC | PRN
  Administered 2021-11-10: 100 mg via ORAL
  Filled 2021-11-04: qty 2

## 2021-11-04 MED ORDER — ACETAMINOPHEN 160 MG/5ML PO SOLN: 650.0000 mg | Freq: Once | ORAL | Status: AC

## 2021-11-04 MED ORDER — MILRINONE LACTATE IN DEXTROSE 20-5 MG/100ML-% IV SOLN: 0.3000 ug/kg/min | INTRAVENOUS | Status: DC

## 2021-11-04 MED ORDER — NOREPINEPHRINE 4 MG/250ML-% IV SOLN
0.0000 ug/min | INTRAVENOUS | Status: DC
  Administered 2021-11-04: 2 ug/min via INTRAVENOUS
  Administered 2021-11-05: 30 ug/min via INTRAVENOUS
  Filled 2021-11-04 (×4): qty 250

## 2021-11-04 MED ORDER — HEPARIN SODIUM (PORCINE) 1000 UNIT/ML IJ SOLN
INTRAMUSCULAR | Status: DC | PRN
  Administered 2021-11-04: 25000 [IU] via INTRAVENOUS

## 2021-11-04 MED ORDER — SODIUM CHLORIDE 0.9 % IV SOLN: 250.0000 mL | INTRAVENOUS | Status: DC

## 2021-11-04 MED ORDER — ALBUMIN HUMAN 5 % IV SOLN
250.0000 mL | INTRAVENOUS | Status: AC | PRN
  Administered 2021-11-04 (×4): 12.5 g via INTRAVENOUS
  Filled 2021-11-04: qty 250

## 2021-11-04 MED ORDER — POTASSIUM CHLORIDE 10 MEQ/50ML IV SOLN: 10.0000 meq | INTRAVENOUS | Status: AC

## 2021-11-04 MED ORDER — BISACODYL 10 MG RE SUPP: 10.0000 mg | Freq: Every day | RECTAL | Status: DC

## 2021-11-04 MED ORDER — DEXTROSE 50 % IV SOLN: 0.0000 mL | INTRAVENOUS | Status: DC | PRN

## 2021-11-04 MED ORDER — FENTANYL CITRATE (PF) 250 MCG/5ML IJ SOLN
INTRAMUSCULAR | Status: DC | PRN
  Administered 2021-11-04: 50 ug via INTRAVENOUS
  Administered 2021-11-04 (×3): 100 ug via INTRAVENOUS
  Administered 2021-11-04 (×2): 50 ug via INTRAVENOUS
  Administered 2021-11-04: 100 ug via INTRAVENOUS
  Administered 2021-11-04: 225 ug via INTRAVENOUS
  Administered 2021-11-04: 25 ug via INTRAVENOUS
  Administered 2021-11-04 (×3): 50 ug via INTRAVENOUS
  Administered 2021-11-04: 100 ug via INTRAVENOUS
  Administered 2021-11-04: 50 ug via INTRAVENOUS
  Administered 2021-11-04: 100 ug via INTRAVENOUS
  Administered 2021-11-04: 50 ug via INTRAVENOUS

## 2021-11-04 MED ORDER — ASPIRIN EC 325 MG PO TBEC
325.0000 mg | DELAYED_RELEASE_TABLET | Freq: Every day | ORAL | Status: DC
  Administered 2021-11-06 – 2021-11-21 (×16): 325 mg via ORAL
  Filled 2021-11-04 (×16): qty 1

## 2021-11-04 MED ORDER — MIDAZOLAM HCL 2 MG/2ML IJ SOLN
2.0000 mg | INTRAMUSCULAR | Status: DC | PRN
  Administered 2021-11-05 (×3): 2 mg via INTRAVENOUS
  Filled 2021-11-04 (×4): qty 2

## 2021-11-04 MED ORDER — LACTATED RINGERS IV SOLN: INTRAVENOUS | Status: DC

## 2021-11-04 MED ORDER — ROCURONIUM BROMIDE 10 MG/ML (PF) SYRINGE
PREFILLED_SYRINGE | INTRAVENOUS | Status: AC
  Filled 2021-11-04: qty 30

## 2021-11-04 MED ORDER — ACETAMINOPHEN 650 MG RE SUPP
650.0000 mg | Freq: Once | RECTAL | Status: AC
  Administered 2021-11-04: 650 mg via RECTAL

## 2021-11-04 MED ORDER — CHLORHEXIDINE GLUCONATE 0.12% ORAL RINSE (MEDLINE KIT)
15.0000 mL | Freq: Two times a day (BID) | OROMUCOSAL | Status: DC
  Administered 2021-11-05 – 2021-11-06 (×2): 15 mL via OROMUCOSAL

## 2021-11-04 MED ORDER — CEFAZOLIN SODIUM-DEXTROSE 2-4 GM/100ML-% IV SOLN
2.0000 g | Freq: Three times a day (TID) | INTRAVENOUS | Status: AC
  Administered 2021-11-04 – 2021-11-06 (×6): 2 g via INTRAVENOUS
  Filled 2021-11-04 (×6): qty 100

## 2021-11-04 MED ORDER — VANCOMYCIN HCL IN DEXTROSE 1-5 GM/200ML-% IV SOLN
1000.0000 mg | Freq: Once | INTRAVENOUS | Status: AC
  Administered 2021-11-04: 1000 mg via INTRAVENOUS
  Filled 2021-11-04: qty 200

## 2021-11-04 MED ORDER — ONDANSETRON HCL 4 MG/2ML IJ SOLN: 4.0000 mg | Freq: Four times a day (QID) | INTRAMUSCULAR | Status: DC | PRN

## 2021-11-04 MED ORDER — ATROPINE SULFATE 0.4 MG/ML IV SOLN
INTRAVENOUS | Status: AC
  Filled 2021-11-04: qty 1

## 2021-11-04 MED ORDER — HEPARIN SODIUM (PORCINE) 1000 UNIT/ML IJ SOLN
INTRAMUSCULAR | Status: AC
  Filled 2021-11-04: qty 1

## 2021-11-04 MED ORDER — BISACODYL 5 MG PO TBEC
10.0000 mg | DELAYED_RELEASE_TABLET | Freq: Every day | ORAL | Status: DC
  Administered 2021-11-06 – 2021-11-17 (×10): 10 mg via ORAL
  Filled 2021-11-04 (×12): qty 2

## 2021-11-04 MED ORDER — SODIUM CHLORIDE 0.9% FLUSH
3.0000 mL | Freq: Two times a day (BID) | INTRAVENOUS | Status: DC
  Administered 2021-11-05 – 2021-11-12 (×13): 3 mL via INTRAVENOUS

## 2021-11-04 MED ORDER — OXYCODONE HCL 5 MG PO TABS
5.0000 mg | ORAL_TABLET | ORAL | Status: DC | PRN
  Administered 2021-11-10 – 2021-11-12 (×2): 5 mg via ORAL
  Filled 2021-11-04 (×2): qty 1

## 2021-11-04 MED ORDER — PROPOFOL 10 MG/ML IV BOLUS
INTRAVENOUS | Status: AC
  Filled 2021-11-04: qty 20

## 2021-11-04 MED ORDER — CHLORHEXIDINE GLUCONATE 0.12 % MT SOLN
15.0000 mL | OROMUCOSAL | Status: AC
  Administered 2021-11-04: 15 mL via OROMUCOSAL

## 2021-11-04 MED ORDER — PHENYLEPHRINE 40 MCG/ML (10ML) SYRINGE FOR IV PUSH (FOR BLOOD PRESSURE SUPPORT)
PREFILLED_SYRINGE | INTRAVENOUS | Status: DC | PRN
  Administered 2021-11-04 (×2): 80 ug via INTRAVENOUS

## 2021-11-04 MED ORDER — PROTAMINE SULFATE 10 MG/ML IV SOLN
INTRAVENOUS | Status: AC
  Filled 2021-11-04: qty 50

## 2021-11-04 MED ORDER — INSULIN REGULAR(HUMAN) IN NACL 100-0.9 UT/100ML-% IV SOLN
INTRAVENOUS | Status: DC
  Administered 2021-11-05: 1.8 [IU]/h via INTRAVENOUS
  Filled 2021-11-04: qty 100

## 2021-11-04 MED ORDER — DEXMEDETOMIDINE HCL IN NACL 400 MCG/100ML IV SOLN
0.0000 ug/kg/h | INTRAVENOUS | Status: DC
  Administered 2021-11-06: 0.3 ug/kg/h via INTRAVENOUS
  Filled 2021-11-04 (×2): qty 100

## 2021-11-04 MED ORDER — SODIUM CHLORIDE 0.45 % IV SOLN: INTRAVENOUS | Status: DC | PRN

## 2021-11-04 MED ORDER — SODIUM CHLORIDE 0.9 % IV SOLN: INTRAVENOUS | Status: DC

## 2021-11-04 MED ORDER — MORPHINE SULFATE (PF) 2 MG/ML IV SOLN
1.0000 mg | INTRAVENOUS | Status: DC | PRN
  Administered 2021-11-05: 06:00:00 2 mg via INTRAVENOUS
  Administered 2021-11-05: 4 mg via INTRAVENOUS
  Administered 2021-11-08: 10:00:00 2 mg via INTRAVENOUS
  Filled 2021-11-04: qty 2
  Filled 2021-11-04 (×2): qty 1

## 2021-11-04 MED ORDER — PANTOPRAZOLE SODIUM 40 MG PO TBEC
40.0000 mg | DELAYED_RELEASE_TABLET | Freq: Every day | ORAL | Status: DC
  Administered 2021-11-06 – 2021-11-21 (×16): 40 mg via ORAL
  Filled 2021-11-04 (×16): qty 1

## 2021-11-04 MED ORDER — ARTIFICIAL TEARS OPHTHALMIC OINT
TOPICAL_OINTMENT | OPHTHALMIC | Status: AC
  Filled 2021-11-04: qty 3.5

## 2021-11-04 MED ORDER — CHLORHEXIDINE GLUCONATE CLOTH 2 % EX PADS
6.0000 | MEDICATED_PAD | Freq: Every day | CUTANEOUS | Status: DC
  Administered 2021-11-06 – 2021-11-13 (×8): 6 via TOPICAL

## 2021-11-04 MED ORDER — ROCURONIUM BROMIDE 10 MG/ML (PF) SYRINGE
PREFILLED_SYRINGE | INTRAVENOUS | Status: DC | PRN
  Administered 2021-11-04: 50 mg via INTRAVENOUS
  Administered 2021-11-04: 100 mg via INTRAVENOUS
  Administered 2021-11-04: 50 mg via INTRAVENOUS
  Administered 2021-11-04: 30 mg via INTRAVENOUS
  Administered 2021-11-04: 50 mg via INTRAVENOUS

## 2021-11-04 MED ORDER — PHENYLEPHRINE 40 MCG/ML (10ML) SYRINGE FOR IV PUSH (FOR BLOOD PRESSURE SUPPORT)
PREFILLED_SYRINGE | INTRAVENOUS | Status: AC
  Filled 2021-11-04: qty 20

## 2021-11-04 MED ORDER — MAGNESIUM SULFATE 4 GM/100ML IV SOLN
4.0000 g | Freq: Once | INTRAVENOUS | Status: AC
  Administered 2021-11-04: 4 g via INTRAVENOUS
  Filled 2021-11-04: qty 100

## 2021-11-04 MED ORDER — EPHEDRINE 5 MG/ML INJ
INTRAVENOUS | Status: AC
  Filled 2021-11-04: qty 5

## 2021-11-04 MED ORDER — PLASMA-LYTE A IV SOLN: INTRAVENOUS | Status: DC | PRN

## 2021-11-04 MED ORDER — SODIUM CHLORIDE (PF) 0.9 % IJ SOLN
OROMUCOSAL | Status: DC | PRN
  Administered 2021-11-04 (×2): 4 mL via TOPICAL

## 2021-11-04 MED ORDER — LACTATED RINGERS IV SOLN
500.0000 mL | Freq: Once | INTRAVENOUS | Status: AC | PRN
  Administered 2021-11-04: 500 mL via INTRAVENOUS

## 2021-11-04 MED ORDER — ASPIRIN 81 MG PO CHEW: 324.0000 mg | CHEWABLE_TABLET | Freq: Every day | ORAL | Status: DC

## 2021-11-04 MED ORDER — MIDAZOLAM HCL (PF) 5 MG/ML IJ SOLN
INTRAMUSCULAR | Status: DC | PRN
  Administered 2021-11-04 (×3): 2 mg via INTRAVENOUS

## 2021-11-04 MED ORDER — METOCLOPRAMIDE HCL 5 MG/ML IJ SOLN
10.0000 mg | Freq: Four times a day (QID) | INTRAMUSCULAR | Status: AC
  Administered 2021-11-04 – 2021-11-05 (×3): 10 mg via INTRAVENOUS
  Filled 2021-11-04 (×3): qty 2

## 2021-11-04 MED ORDER — ALBUMIN HUMAN 5 % IV SOLN: INTRAVENOUS | Status: DC | PRN

## 2021-11-04 MED ORDER — METOPROLOL TARTRATE 12.5 MG HALF TABLET: 12.5000 mg | ORAL_TABLET | Freq: Two times a day (BID) | ORAL | Status: DC

## 2021-11-04 MED ORDER — SODIUM CHLORIDE 0.9% FLUSH: 3.0000 mL | INTRAVENOUS | Status: DC | PRN

## 2021-11-04 MED ORDER — FENTANYL CITRATE (PF) 100 MCG/2ML IJ SOLN
INTRAMUSCULAR | Status: AC
  Filled 2021-11-04: qty 2

## 2021-11-04 MED ORDER — ORAL CARE MOUTH RINSE
15.0000 mL | OROMUCOSAL | Status: DC
  Administered 2021-11-04 – 2021-11-06 (×13): 15 mL via OROMUCOSAL

## 2021-11-04 MED ORDER — ACETAMINOPHEN 500 MG PO TABS
1000.0000 mg | ORAL_TABLET | Freq: Four times a day (QID) | ORAL | Status: AC
  Administered 2021-11-06 – 2021-11-09 (×10): 1000 mg via ORAL
  Filled 2021-11-04 (×10): qty 2

## 2021-11-04 MED ORDER — LIDOCAINE HCL (CARDIAC) PF 100 MG/5ML IV SOSY
PREFILLED_SYRINGE | INTRAVENOUS | Status: DC | PRN
  Administered 2021-11-04: 100 mg via INTRAVENOUS

## 2021-11-04 MED ORDER — PHENYLEPHRINE HCL-NACL 20-0.9 MG/250ML-% IV SOLN
0.0000 ug/min | INTRAVENOUS | Status: DC
  Administered 2021-11-04: 175 ug/min via INTRAVENOUS
  Administered 2021-11-05 (×2): 400 ug/min via INTRAVENOUS
  Administered 2021-11-05: 250 ug/min via INTRAVENOUS
  Administered 2021-11-05: 400 ug/min via INTRAVENOUS
  Filled 2021-11-04 (×6): qty 250

## 2021-11-04 MED ORDER — SODIUM CHLORIDE 0.9 % IV SOLN: INTRAVENOUS | Status: DC | PRN

## 2021-11-04 MED ORDER — METOPROLOL TARTRATE 25 MG/10 ML ORAL SUSPENSION: 12.5000 mg | Freq: Two times a day (BID) | ORAL | Status: DC

## 2021-11-04 MED ORDER — MIDAZOLAM HCL (PF) 10 MG/2ML IJ SOLN
INTRAMUSCULAR | Status: AC
  Filled 2021-11-04: qty 2

## 2021-11-04 MED ORDER — PROPOFOL 10 MG/ML IV BOLUS
INTRAVENOUS | Status: DC | PRN
  Administered 2021-11-04: 50 mg via INTRAVENOUS

## 2021-11-04 MED ORDER — EPHEDRINE SULFATE-NACL 50-0.9 MG/10ML-% IV SOSY
PREFILLED_SYRINGE | INTRAVENOUS | Status: DC | PRN
  Administered 2021-11-04: 10 mg via INTRAVENOUS

## 2021-11-04 MED ORDER — NITROGLYCERIN IN D5W 200-5 MCG/ML-% IV SOLN: 0.0000 ug/min | INTRAVENOUS | Status: DC

## 2021-11-04 MED ORDER — PROTAMINE SULFATE 10 MG/ML IV SOLN
INTRAVENOUS | Status: DC | PRN
  Administered 2021-11-04: 10 mg via INTRAVENOUS
  Administered 2021-11-04: 240 mg via INTRAVENOUS

## 2021-11-04 SURGICAL SUPPLY — 114 items
ADAPTER CARDIO PERF ANTE/RETRO (ADAPTER) ×4 IMPLANT
ADH SKN CLS APL DERMABOND .7 (GAUZE/BANDAGES/DRESSINGS) ×3
ADPR PRFSN 84XANTGRD RTRGD (ADAPTER) ×3
BAG COUNTER SPONGE SURGICOUNT (BAG) ×3 IMPLANT
BAG DECANTER FOR FLEXI CONT (MISCELLANEOUS) ×7 IMPLANT
BAG SPNG CNTER NS LX DISP (BAG) ×9
BLADE CLIPPER SURG (BLADE) ×7 IMPLANT
BLADE STERNUM SYSTEM 6 (BLADE) ×7 IMPLANT
BLADE SURG 11 STRL SS (BLADE) ×1 IMPLANT
BLADE SURG 15 STRL LF DISP TIS (BLADE) ×3 IMPLANT
BLADE SURG 15 STRL SS (BLADE) ×4
BNDG ELASTIC 4X5.8 VLCR STR LF (GAUZE/BANDAGES/DRESSINGS) ×4 IMPLANT
BNDG ELASTIC 6X5.8 VLCR STR LF (GAUZE/BANDAGES/DRESSINGS) ×4 IMPLANT
BNDG GAUZE ELAST 4 BULKY (GAUZE/BANDAGES/DRESSINGS) ×4 IMPLANT
CABLE SURGICAL S-101-97-12 (CABLE) ×4 IMPLANT
CANISTER SUCT 3000ML PPV (MISCELLANEOUS) ×7 IMPLANT
CANNULA GUNDRY RCSP 15FR (MISCELLANEOUS) ×4 IMPLANT
CANNULA MC2 2 STG 29/37 NON-V (CANNULA) ×3 IMPLANT
CANNULA MC2 TWO STAGE (CANNULA) ×1
CANNULA NON VENT 20FR 12 (CANNULA) ×7 IMPLANT
CATH CPB KIT HENDRICKSON (MISCELLANEOUS) ×4 IMPLANT
CATH HEART VENT LEFT (CATHETERS) ×3 IMPLANT
CATH ROBINSON RED A/P 18FR (CATHETERS) ×18 IMPLANT
CLIP VESOCCLUDE MED 24/CT (CLIP) IMPLANT
CLIP VESOCCLUDE SM WIDE 24/CT (CLIP) IMPLANT
CNTNR URN SCR LID CUP LEK RST (MISCELLANEOUS) ×3 IMPLANT
CONN ST 1/2X1/2  BEN (MISCELLANEOUS) ×1
CONN ST 1/2X1/2 BEN (MISCELLANEOUS) ×6 IMPLANT
CONN ST 1/4X3/8  BEN (MISCELLANEOUS) ×1
CONN ST 1/4X3/8 BEN (MISCELLANEOUS) ×6 IMPLANT
CONNECTOR BLAKE 2:1 CARIO BLK (MISCELLANEOUS) ×4 IMPLANT
CONT SPEC 4OZ STRL OR WHT (MISCELLANEOUS) ×4
CONTAINER PROTECT SURGISLUSH (MISCELLANEOUS) ×13 IMPLANT
COVER SURGICAL LIGHT HANDLE (MISCELLANEOUS) ×4 IMPLANT
DERMABOND ADVANCED (GAUZE/BANDAGES/DRESSINGS) ×1
DERMABOND ADVANCED .7 DNX12 (GAUZE/BANDAGES/DRESSINGS) IMPLANT
DEVICE SUT CK QUICK LOAD MINI (Prosthesis & Implant Heart) ×1 IMPLANT
DRAIN CHANNEL 19F RND (DRAIN) ×12 IMPLANT
DRAIN CONNECTOR BLAKE 1:1 (MISCELLANEOUS) ×4 IMPLANT
DRAPE CARDIOVASCULAR INCISE (DRAPES) ×4
DRAPE INCISE IOBAN 66X45 STRL (DRAPES) IMPLANT
DRAPE SRG 135X102X78XABS (DRAPES) ×6 IMPLANT
DRAPE WARM FLUID 44X44 (DRAPES) ×7 IMPLANT
DRSG AQUACEL AG ADV 3.5X10 (GAUZE/BANDAGES/DRESSINGS) ×4 IMPLANT
DRSG COVADERM 4X14 (GAUZE/BANDAGES/DRESSINGS) ×6 IMPLANT
ELECT BLADE 4.0 EZ CLEAN MEGAD (MISCELLANEOUS) ×4
ELECT CAUTERY BLADE 6.4 (BLADE) ×4 IMPLANT
ELECT REM PT RETURN 9FT ADLT (ELECTROSURGICAL) ×8
ELECTRODE BLDE 4.0 EZ CLN MEGD (MISCELLANEOUS) ×3 IMPLANT
ELECTRODE REM PT RTRN 9FT ADLT (ELECTROSURGICAL) ×12 IMPLANT
FELT TEFLON 1X6 (MISCELLANEOUS) ×13 IMPLANT
GAUZE 4X4 16PLY ~~LOC~~+RFID DBL (SPONGE) ×7 IMPLANT
GAUZE SPONGE 4X4 12PLY STRL (GAUZE/BANDAGES/DRESSINGS) ×11 IMPLANT
GLOVE SURG ENC MOIS LTX SZ6 (GLOVE) IMPLANT
GLOVE SURG ENC MOIS LTX SZ6.5 (GLOVE) IMPLANT
GLOVE SURG ENC MOIS LTX SZ7 (GLOVE) ×15 IMPLANT
GLOVE SURG ENC MOIS LTX SZ7.5 (GLOVE) ×2 IMPLANT
GLOVE SURG ENC TEXT LTX SZ7.5 (GLOVE) ×7 IMPLANT
GLOVE SURG MICRO LTX SZ6 (GLOVE) ×3 IMPLANT
GLOVE SURG MICRO LTX SZ6.5 (GLOVE) ×3 IMPLANT
GOWN STRL REUS W/ TWL LRG LVL3 (GOWN DISPOSABLE) ×24 IMPLANT
GOWN STRL REUS W/ TWL XL LVL3 (GOWN DISPOSABLE) ×12 IMPLANT
GOWN STRL REUS W/TWL LRG LVL3 (GOWN DISPOSABLE) ×36
GOWN STRL REUS W/TWL XL LVL3 (GOWN DISPOSABLE) ×8
HEMOSTAT POWDER SURGIFOAM 1G (HEMOSTASIS) ×20 IMPLANT
HEMOSTAT SURGICEL 2X14 (HEMOSTASIS) ×4 IMPLANT
INSERT SUTURE HOLDER (MISCELLANEOUS) ×7 IMPLANT
KIT BASIN OR (CUSTOM PROCEDURE TRAY) ×7 IMPLANT
KIT SUCTION CATH 14FR (SUCTIONS) ×7 IMPLANT
KIT SUT CK MINI COMBO 4X17 (Prosthesis & Implant Heart) ×1 IMPLANT
KIT TURNOVER KIT B (KITS) ×7 IMPLANT
KIT VASOVIEW HEMOPRO 2 VH 4000 (KITS) ×4 IMPLANT
LEAD PACING MYOCARDI (MISCELLANEOUS) ×5 IMPLANT
MARKER GRAFT CORONARY BYPASS (MISCELLANEOUS) ×12 IMPLANT
NS IRRIG 1000ML POUR BTL (IV SOLUTION) ×38 IMPLANT
PACK ACCESSORY CANNULA KIT (KITS) ×4 IMPLANT
PACK E OPEN HEART (SUTURE) ×7 IMPLANT
PACK OPEN HEART (CUSTOM PROCEDURE TRAY) ×7 IMPLANT
PAD ARMBOARD 7.5X6 YLW CONV (MISCELLANEOUS) ×14 IMPLANT
PAD ELECT DEFIB RADIOL ZOLL (MISCELLANEOUS) ×4 IMPLANT
PENCIL BUTTON HOLSTER BLD 10FT (ELECTRODE) ×4 IMPLANT
POSITIONER HEAD DONUT 9IN (MISCELLANEOUS) ×7 IMPLANT
PUNCH AORTIC ROTATE 4.0MM (MISCELLANEOUS) ×4 IMPLANT
SET MPS 3-ND DEL (MISCELLANEOUS) ×1 IMPLANT
SPONGE T-LAP 18X18 ~~LOC~~+RFID (SPONGE) ×31 IMPLANT
SUPPORT HEART JANKE-BARRON (MISCELLANEOUS) ×4 IMPLANT
SUT BONE WAX W31G (SUTURE) ×7 IMPLANT
SUT EB EXC GRN/WHT 2-0 V-5 (SUTURE) ×8 IMPLANT
SUT ETHIBOND X763 2 0 SH 1 (SUTURE) ×8 IMPLANT
SUT MNCRL AB 3-0 PS2 18 (SUTURE) ×8 IMPLANT
SUT MNCRL AB 4-0 PS2 18 (SUTURE) ×1 IMPLANT
SUT PDS AB 1 CTX 36 (SUTURE) ×8 IMPLANT
SUT PROLENE 3 0 SH DA (SUTURE) IMPLANT
SUT PROLENE 3 0 SH1 36 (SUTURE) ×4 IMPLANT
SUT PROLENE 4 0 RB 1 (SUTURE) ×20
SUT PROLENE 4 0 SH DA (SUTURE) ×4 IMPLANT
SUT PROLENE 4-0 RB1 .5 CRCL 36 (SUTURE) ×9 IMPLANT
SUT PROLENE 5 0 C 1 36 (SUTURE) ×15 IMPLANT
SUT PROLENE 7 0 BV 1 (SUTURE) ×1 IMPLANT
SUT PROLENE 7 0 BV1 MDA (SUTURE) ×5 IMPLANT
SUT STEEL 6MS V (SUTURE) ×8 IMPLANT
SUT VIC AB 2-0 CT1 27 (SUTURE) ×4
SUT VIC AB 2-0 CT1 TAPERPNT 27 (SUTURE) IMPLANT
SYSTEM SAHARA CHEST DRAIN ATS (WOUND CARE) ×7 IMPLANT
TAPE CLOTH SURG 4X10 WHT LF (GAUZE/BANDAGES/DRESSINGS) ×1 IMPLANT
TAPE PAPER 2X10 WHT MICROPORE (GAUZE/BANDAGES/DRESSINGS) ×1 IMPLANT
TOWEL GREEN STERILE (TOWEL DISPOSABLE) ×7 IMPLANT
TOWEL GREEN STERILE FF (TOWEL DISPOSABLE) ×7 IMPLANT
TRAY FOLEY SLVR 16FR TEMP STAT (SET/KITS/TRAYS/PACK) ×7 IMPLANT
TUBING LAP HI FLOW INSUFFLATIO (TUBING) ×4 IMPLANT
UNDERPAD 30X36 HEAVY ABSORB (UNDERPADS AND DIAPERS) ×7 IMPLANT
VALVE AORTIC SZ23 INSP/RESIL (Prosthesis & Implant Heart) ×1 IMPLANT
VENT LEFT HEART 12002 (CATHETERS) ×4
WATER STERILE IRR 1000ML POUR (IV SOLUTION) ×14 IMPLANT

## 2021-11-04 NOTE — Hospital Course (Addendum)
History of Present Illness:  Joshua [name] is an 86 yo male with history of PVC's, arthritis, GERD, HTN, HLD, Aortic Stenosis.  The patient was referred to Dr. Audie Box by his PCP after he suffered a syncopal episode.  The patient stated he was walking in his neighborhood when he felt light headed and ultimately passed out.  He denied seizure like activity and did not experience any loss of bladder/stool function.  His Aortic Stenosis was originally diagnosed in 2017, has not been followed up on since that time as he did not know this was required.  The patient also experiences lower extremity edema which has been present for the past several months.  Physical exam by Dr. Jenetta DownerNori Riis revealed a very loud murmur with an absent S2 and he was concerned he had progression to severe aortic stenosis.  Repeat Echocardiogram was obtained and showed a preserved EF of 60-65% with severe aortic stenosis with Mean gradient 58 mmHg, peak gradient 95.6 mmHg, AVA 0.79 cm2, DI 0.20.  His valve leaflets are also calcified and thickened with restricted leaflet motion.  He was referred to Dr. Angelena Form for possible TAVR procedure.  It was felt with recent syncope the patient should have expedited TAVR procedure.  He would require cardiac catheterization prior to proceeding.  This was performed today 1/16 and showed 3v CAD.  This was felt the patient would require coronary bypass grafting procedure and conventional Aortic Valve replacement and Cardiac surgery consultation was requested.  The patient is currently symptom free.  He is disappointed he is unable to have a TAVR procedure.  Prior to surgery he was not that active due to personal choice.  He was able to ambulate to the mailbox without difficulty, however his son states he previous played golf but had to stop due to leg weakness.  He is a former smoking and quit more than 50 years ago.  He routinely has dentist visits every 6 months.  He is an occasional drinker.  He is agreeable  to proceed with surgical intervention tomorrow with Dr. Kipp Brood.   Hospital Course:  The patient was taken to the operating room on 11/04/2020.  He underwent CABG x 3 utilizing LIMA to LAD, SVG to OM, and SVG to PDA.  He also underwent Aortic Valve Replacement with a 23 mm Edwards Resilia Bioprosthetic valve.  He also underwent endoscopic harvest of greater saphenous vein from his right leg.  He tolerated the procedure without difficulty and was taken to the SICU in stable condition.  The patient had increased pressor requirement overnight.  He had high chest tube output.  He was treated with 2 units of packed red cells.  Follow up labs showed evidence of coagulopathy and he was treated with 4 FFP and 1 platelet.  He was acidotic and bicard was administered.  The patient's bleeding slowed some after administration of products, however output was still > 100 cc/hr.  His pacemaker leads were also only capturing intermittently.  It was felt he would require re-exploration in the operating room.  During the procedure he remained coagulopathic.  TEG was obtained and the patient was transfused additional 3 units of packed cells, 3 units of FFP, 2 platelets, and a unit of FFP per the result. Post procedure he was transported back to the SICU.  Hgb level was improved to 9.2 he was able to be weaned off Epinephrine and Levophed requirements decreased.  The patient was weaned and extubated on POD #2.  He was weaned off  all pressor support.  His hemoglobin remained stable.  His arterial line was removed without difficulty.  He was started on IV lasix for hypervolemic state.  He continued to require V. Pacing with episodes of loss of capture.  EP consult was obtained and they felt permanent pacemaker would be indicated.  He underwent placement of a temporary transvenous pacer on 11/07/2021.  This was replaced with a permanent pacemaker on 11/09/2021.  The patient epicardial pacing wires were removed without difficulty.  His  chest tube output decreased and his tubes were removed on 11/08/2021.  He did develop an acute renal injury but creatinine has shown steady improvement and is now in the normal range.  He has an expected acute blood loss anemia which is stabilized over time.  He has a postoperative thrombocytopenia.  Rehabilitation modalities have been slow due to pacer dependency.  He was hypertensive and started on Lisinopril.  CXR on 1/23 showed bilateral pneumothoraces.  He underwent placement of a right and left side pigtail catheter.  Follow up CXR showed some re-expansion of the lung.  He was started on Lopressor for additional BP relief.  His pacing wires were removed without difficulty.  It was felt patient would benefit from inpatient rehab and CIR consult was placed. His right pneumothorax resolved.  His right sided pigtail catheter was free from air leak and removed on 11/12/2021. Follow up CXR showed no evidence of pneumothorax. He was transitioned to an oral regimen of Lasix, potassium.  His hemoglobin level remained stable. The patient's left chest tube remained free from air leak and was removed on 11/13/2021.  Follow up CXR showed minimal apical pneumothorax on the left.  He was stable for transfer to the progressive care unit on 11/13/2021.  The patient continues to do well.  He is in a paced rhythm and his PPM is functioning appropriately.  His blood pressure was low and his Lisinopril was decreased to 10 mg daily.  He is deconditioned and is working with PT/OT.  They continue to recommend CIR placement.  Unfortunately, [employer] has declined CIR placement.  Due to this SNF placement will be arranged.  The patient is agreeable to this.  His surgical incisions are healing without evidence of infection.  He is felt medically stable for discharge to SNF today.

## 2021-11-04 NOTE — Anesthesia Procedure Notes (Signed)
Arterial Line Insertion Start/End1/17/2023 10:30 AM, 11/04/2021 10:40 AM Performed by: CRNA  Preanesthetic checklist: patient identified, IV checked, site marked, risks and benefits discussed, surgical consent, monitors and equipment checked, pre-op evaluation and timeout performed Lidocaine 1% used for infiltration and patient sedated Left, radial was placed Catheter size: 20 G Hand hygiene performed  and maximum sterile barriers used  Allen's test indicative of satisfactory collateral circulation Attempts: 1 Procedure performed without using ultrasound guided technique. Following insertion, dressing applied and Biopatch. Post procedure assessment: normal  Patient tolerated the procedure well with no immediate complications.

## 2021-11-04 NOTE — Anesthesia Procedure Notes (Addendum)
Central Venous Catheter Insertion Performed by: Santa Lighter, MD, anesthesiologist Start/End1/17/2023 10:22 AM, 11/04/2021 10:32 AM Patient location: Pre-op. Preanesthetic checklist: patient identified, IV checked, site marked, risks and benefits discussed, surgical consent, monitors and equipment checked, pre-op evaluation, timeout performed and anesthesia consent Position: Trendelenburg Lidocaine 1% used for infiltration and patient sedated Hand hygiene performed , maximum sterile barriers used  and Seldinger technique used Catheter size: 9 Fr Central line was placed.MAC introducer Procedure performed using ultrasound guided technique. Ultrasound Notes:anatomy identified, needle tip was noted to be adjacent to the nerve/plexus identified, no ultrasound evidence of intravascular and/or intraneural injection and image(s) printed for medical record Attempts: 1 Following insertion, line sutured, dressing applied and Biopatch. Post procedure assessment: free fluid flow, blood return through all ports and no air  Patient tolerated the procedure well with no immediate complications. Additional procedure comments: SLIC through introducer.

## 2021-11-04 NOTE — Anesthesia Postprocedure Evaluation (Signed)
Anesthesia Post Note  Patient: Joshua [name]  Procedure(s) Performed: CORONARY ARTERY BYPASS GRAFTING (CABG) X 3, USING LEFT INTERNAL MAMMARY ARTERY AND ENDOSCOPICALLY HARVESTED RIGHT GREATER SAPHENOUS VEIN. LIMA TO LAD, SVG TO PDA, SVG TO OM (Chest) AORTIC VALVE REPLACEMENT (AVR) USING 23 MM INSPIRIS RESILIA  AORTIC VALVE (Chest) TRANSESOPHAGEAL ECHOCARDIOGRAM (TEE) ENDOVEIN HARVEST OF GREATER SAPHENOUS VEIN (Right: Leg Lower) APPLICATION OF CELL SAVER (Chest)     Patient location during evaluation: SICU Anesthesia Type: General Level of consciousness: sedated Pain management: pain level controlled Vital Signs Assessment: post-procedure vital signs reviewed and stable Respiratory status: patient remains intubated per anesthesia plan Cardiovascular status: stable Postop Assessment: no apparent nausea or vomiting Anesthetic complications: no   No notable events documented.  Last Vitals:  Vitals:   11/04/21 2000 11/04/21 2005  BP: (!) 80/61   Pulse: 80   Resp: 12 12  Temp:    SpO2: 91%     Last Pain:  Vitals:   11/04/21 0953  TempSrc:   PainSc: 0-No pain                 Barbera Perritt DANIEL

## 2021-11-04 NOTE — Anesthesia Procedure Notes (Signed)
Procedure Name: Intubation Date/Time: 11/04/2021 1:00 PM Performed by: Alain Marion, CRNA Pre-anesthesia Checklist: Patient identified, Emergency Drugs available, Suction available and Patient being monitored Patient Re-evaluated:Patient Re-evaluated prior to induction Oxygen Delivery Method: Circle System Utilized Preoxygenation: Pre-oxygenation with 100% oxygen Induction Type: IV induction Ventilation: Mask ventilation without difficulty and Oral airway inserted - appropriate to patient size Laryngoscope Size: Sabra Heck and 2 Grade View: Grade I Tube type: Oral Tube size: 8.0 mm Number of attempts: 1 Airway Equipment and Method: Stylet and Oral airway Placement Confirmation: ETT inserted through vocal cords under direct vision, positive ETCO2 and breath sounds checked- equal and bilateral Secured at: 23 cm Tube secured with: Tape Dental Injury: Teeth and Oropharynx as per pre-operative assessment

## 2021-11-04 NOTE — Progress Notes (Signed)
°   °  [address]       ,[place] [address]             [phone]       No events  Vitals:   11/04/21 0400 11/04/21 0433  BP: (!) 93/51 110/70  Pulse: 78 78  Resp: 11   Temp: 97.7 F (36.5 C)   SpO2: 94%    Alert NAD Sinus EWOB  OR today for CABG 3/AVR  Joshua [name]

## 2021-11-04 NOTE — Brief Op Note (Addendum)
11/03/2021 - 11/04/2021  5:50 PM  PATIENT:  Joshua [name]  86 y.o. male  PRE-OPERATIVE DIAGNOSIS:  1. SEVERE AORTIC STENOSIS 2. CORONARY ARTERY DISEASE  POST-OPERATIVE DIAGNOSIS:  1. SEVERE AORTIC STENOSIS 2.CORONARY ARTERY DISEASE  PROCEDURE:  TRANSESOPHAGEAL ECHOCARDIOGRAM (TEE), CORONARY ARTERY BYPASS GRAFTING (CABG) X 3 (LIMA to LAD and SVG to OM, SVG to PDA), USING LEFT INTERNAL MAMMARY ARTERY AND ENDOSCOPICALLY HARVESTED RIGHT GREATER SAPHENOUS VEIN, (25/15 min) AORTIC VALVE REPLACEMENT (AVR) USING 23 MM INSPIRIS RESILIA  AORTIC VALVE (Model# 11500A, SERIAL # [id number]), ENDOVEIN HARVEST OF GREATER SAPHENOUS VEIN (Right) APPLICATION OF CELL SAVER   SURGEON:  Surgeon(s) and Role:    Lightfoot, Lucile Crater, MD - Primary  PHYSICIAN ASSISTANTS: 1. Aran Menning PA-C 2. Lars Pinks PA-C  ASSISTANTS: Alcide Evener RNFA   ANESTHESIA:   general  EBL: Per anesthesia, perfusion record  DRAINS:  Chest tubes placed in the mediastinal and pleural spaces    SPECIMEN:  Source of Specimen:  Native AV leaflets  DISPOSITION OF SPECIMEN:  PATHOLOGY  COUNTS CORRECT:  YES  DICTATION: .Dragon Dictation  PLAN OF CARE: Admit to inpatient   PATIENT DISPOSITION:  ICU - intubated and hemodynamically stable.   Delay start of Pharmacological VTE agent (>24hrs) due to surgical blood loss or risk of bleeding: yes   BASELINE WEIGHT: 82.3 kg

## 2021-11-04 NOTE — Op Note (Signed)
[address]       [place] [address]             [phone]                                           11/04/2021 Patient:  Joshua [name] Pre-Op Dx: Severe aortic stenosis   Left main/three-vessel coronary artery disease Post-op Dx: Same Procedure: Aortic valve replacement with a 23 mm Inspiris valve Coronary artery bypass grafting x3.  With LIMA to LAD, reverse saphenous vein to PDA, and obtuse marginal.   Surgeon and Role:      * Jaelani Posa, Lucile Crater, MD - Primary    *E. Barrett, PA-C and D. Tacy Dura, PA-C-  An experienced assistant was required given the complexity of this surgery and the standard of surgical care. The assistant was needed for exposure, dissection, suctioning, retraction of delicate tissues and sutures, instrument exchange and for overall help during this procedure.    Anesthesia  general EBL: 1 L  Blood Administration: 2 units of packed red blood cells, 1 unit of platelets Xclamp Time:  150 min Pump Time:  174min  Drains: 43 F blake drain: L, mediastinal  Wires: A/V Counts: correct   Indications: 86 yo male with severe aortic stenosis, and 3V CAD.  He is agreeable to proceed with surgical revascularization and valve replacement.  The risks and benefits  have been discussed, and he is scheduled for 11/04/21. Findings: Good LIMA, good vein conduit.  The LAD was heavily calcified.  None the PDA was small.  The obtuse marginal was calcified but overall good target.  The aortic valve is trileaflet with heavy calcification.  The annulus required significant debridement of all calcium.  Operative Technique: All invasive lines were placed in pre-op holding.  After the risks, benefits and alternatives were thoroughly discussed, the patient was brought to the operative theatre.  Anesthesia was induced, and the patient was prepped and draped in normal sterile fashion.  An appropriate surgical pause was performed, and pre-operative antibiotics  were dosed accordingly.  We began with simultaneous incisions along the right leg for harvesting of the greater saphenous vein and the chest for the sternotomy.  In regards to the sternotomy, this was carried down with bovie cautery, and the sternum was divided with a reciprocating saw.  Meticulous hemostasis was obtained.  The left internal thoracic artery was exposed and harvested in in pedicled fashion.  The patient was systemically heparinized, and the artery was divided distally, and placed in a papaverine sponge.    The sternal elevator was removed, and a retractor was placed.  The pericardium was divided in the midline and fashioned into a cradle with pericardial stitches.   After we confirmed an appropriate ACT, the ascending aorta was cannulated in standard fashion.  The right atrial appendage was used for venous cannulation site.  Cardiopulmonary bypass was initiated, and the heart retractor was placed. The cross clamp was applied, and a dose of anterograde cardioplegia was given with good arrest of the heart.  We moved to the posterior wall of the heart, and found a good target on the PDA.  An arteriotomy was made, and the vein graft was anastomosed to it in an end to side fashion.  Next we exposed the lateral wall, and found a good target on the OM.  An end  to side anastomosis with the vein graft was then created.  Finally, we exposed a good target on the LAD, and fashioned an end to side anastomosis between it and the LITA.   Another dose of antegrade cardioplegia was given. Our aortotomy was made and directed toward the non coronary cusp.  The valve was inspected.  It was trileaflet and heavily calcified.  All leaflets were excised.  The annulus was debrided of all calcium and sized to a 23 mm Insprirs valve.  The left ventricle was then copiously irrigated.  Pledgeted mattress sutures were placed circumferentially through the annulus.  These sutures were then passed through the sewing ring of  the valve.  Once the valve was seated in the annulus, it was secured with Core-knot sutures.  We began to rewarm, and close our aortotomy in 2 layers.  The vein grafts were then anastomosed to the ascending aorta.   A re-animation dose of cardioplegia was given.  After de-airing the heart, the aortic cross clamp was removed.  We checked our valve function, and for air using the TEE.  Once we were satisfying, we separated from cardiopulmonary bypass without event.    The heparin was reversed with protamine, and hemostasis was obtained.  Chest tubes and wires were placed, and the sternum was re-approximated with with sternal wires.  The soft tissue and skin were re-approximated wth absorbable suture.    The patient tolerated the procedure without any immediate complications, and was transferred to the ICU in guarded condition.  Joshua [name] Joshua [name]

## 2021-11-04 NOTE — Anesthesia Procedure Notes (Deleted)
Central Venous Catheter Insertion Performed by: Santa Lighter, MD, anesthesiologist Patient location: Pre-op. Preanesthetic checklist: patient identified, IV checked, site marked, risks and benefits discussed, surgical consent, monitors and equipment checked, pre-op evaluation and timeout performed Position: Trendelenburg Hand hygiene performed  and maximum sterile barriers used  Total catheter length 100. PA cath was placed.Swan type:thermodilution Procedure performed without using ultrasound guided technique. Attempts: 1 Patient tolerated the procedure well with no immediate complications.

## 2021-11-04 NOTE — Transfer of Care (Signed)
Immediate Anesthesia Transfer of Care Note  Patient: TAYSON SCHNELLE  Procedure(s) Performed: CORONARY ARTERY BYPASS GRAFTING (CABG) X 3, USING LEFT INTERNAL MAMMARY ARTERY AND ENDOSCOPICALLY HARVESTED RIGHT GREATER SAPHENOUS VEIN. LIMA TO LAD, SVG TO PDA, SVG TO OM (Chest) AORTIC VALVE REPLACEMENT (AVR) USING 23 MM INSPIRIS RESILIA  AORTIC VALVE (Chest) TRANSESOPHAGEAL ECHOCARDIOGRAM (TEE) ENDOVEIN HARVEST OF GREATER SAPHENOUS VEIN (Right: Leg Lower) APPLICATION OF CELL SAVER (Chest)  Patient Location: ICU  Anesthesia Type:General  Level of Consciousness: Patient remains intubated per anesthesia plan  Airway & Oxygen Therapy: Patient remains intubated per anesthesia plan and Patient placed on Ventilator (see vital sign flow sheet for setting)  Post-op Assessment: Report given to RN and Post -op Vital signs reviewed and stable  Post vital signs: Reviewed and stable  Last Vitals:  Vitals Value Taken Time  BP 80/61 11/04/21 2000  Temp    Pulse 80 11/04/21 2000  Resp 12 11/04/21 2001  SpO2 91 % 11/04/21 2000  Vitals shown include unvalidated device data.  Last Pain:  Vitals:   11/04/21 0953  TempSrc:   PainSc: 0-No pain         Complications: No notable events documented.

## 2021-11-04 NOTE — Anesthesia Preprocedure Evaluation (Addendum)
Anesthesia Evaluation  Patient identified by MRN, date of birth, ID band Patient awake    Reviewed: Allergy & Precautions, NPO status , Patient's Chart, lab work & pertinent test results, reviewed documented beta blocker date and time   Airway Mallampati: II  TM Distance: <3 FB Neck ROM: Full    Dental  (+) Teeth Intact, Dental Advisory Given, Missing, Chipped,    Pulmonary former smoker,    Pulmonary exam normal breath sounds clear to auscultation       Cardiovascular hypertension, Pt. on home beta blockers and Pt. on medications + Valvular Problems/Murmurs AS  Rhythm:Regular Rate:Normal + Systolic murmurs Echo 34/19/37: 1. The aortic valve was not well visualized. Aortic valve regurgitation is not visualized. Severe aortic valve stenosis. Aortic valve area, by VTI measures 0.90 cm. Aortic valve mean gradient measures 58.0 mmHg. Aortic  valve Vmax measures 4.89 m/s.  2. Left ventricular ejection fraction, by estimation, is 60 to 65%. The left ventricle has normal function. The left ventricle has no regional wall motion abnormalities. There is mild concentric left ventricular hypertrophy. Left ventricular diastolic parameters are consistent with Grade I diastolic dysfunction (impaired relaxation). There is a small intracavitary < 20 mmHg with Valsalva.  3. Right ventricular systolic function is normal. The right ventricular size is normal. There is normal pulmonary artery systolic pressure.  4. Left atrial size was mildly dilated.  5. The mitral valve is degenerative. No evidence of mitral valve regurgitation.    Neuro/Psych negative neurological ROS     GI/Hepatic Neg liver ROS, GERD  Medicated,Esophageal stricture    Endo/Other  negative endocrine ROS  Renal/GU negative Renal ROS     Musculoskeletal  (+) Arthritis ,   Abdominal   Peds  Hematology  (+) Blood dyscrasia (Thrombocytopenia), anemia ,   Anesthesia  Other Findings Day of surgery medications reviewed with the patient.  Reproductive/Obstetrics                            Anesthesia Physical Anesthesia Plan  ASA: 4  Anesthesia Plan: General   Post-op Pain Management:    Induction: Intravenous  PONV Risk Score and Plan: 2 and Midazolam  Airway Management Planned: Oral ETT  Additional Equipment: Arterial line, CVP, PA Cath and Ultrasound Guidance Line Placement  Intra-op Plan:   Post-operative Plan: Post-operative intubation/ventilation  Informed Consent: I have reviewed the patients History and Physical, chart, labs and discussed the procedure including the risks, benefits and alternatives for the proposed anesthesia with the patient or authorized representative who has indicated his/her understanding and acceptance.     Dental advisory given  Plan Discussed with: CRNA  Anesthesia Plan Comments:         Anesthesia Quick Evaluation

## 2021-11-05 ENCOUNTER — Inpatient Hospital Stay (HOSPITAL_COMMUNITY): Payer: Medicare HMO

## 2021-11-05 ENCOUNTER — Inpatient Hospital Stay (HOSPITAL_COMMUNITY): Payer: Medicare HMO | Admitting: Anesthesiology

## 2021-11-05 ENCOUNTER — Inpatient Hospital Stay (HOSPITAL_COMMUNITY)
Admission: AD | Disposition: A | Discharge status: Skilled Nursing Facility | Payer: Self-pay | Source: Home / Self Care | Attending: Thoracic Surgery (Cardiothoracic Vascular Surgery)

## 2021-11-05 ENCOUNTER — Encounter (HOSPITAL_COMMUNITY): Payer: Self-pay | Admitting: Thoracic Surgery (Cardiothoracic Vascular Surgery)

## 2021-11-05 DIAGNOSIS — I97611 Postprocedural hemorrhage and hematoma of a circulatory system organ or structure following cardiac bypass: Secondary | ICD-10-CM

## 2021-11-05 DIAGNOSIS — Z953 Presence of xenogenic heart valve: Secondary | ICD-10-CM

## 2021-11-05 DIAGNOSIS — Z951 Presence of aortocoronary bypass graft: Secondary | ICD-10-CM | POA: Diagnosis not present

## 2021-11-05 DIAGNOSIS — I350 Nonrheumatic aortic (valve) stenosis: Secondary | ICD-10-CM | POA: Diagnosis not present

## 2021-11-05 HISTORY — PX: EXPLORATION POST OPERATIVE OPEN HEART: SHX5061

## 2021-11-05 LAB — CBC
HCT: 15.5 % — ABNORMAL LOW (ref 39.0–52.0)
HCT: 18.6 % — ABNORMAL LOW (ref 39.0–52.0)
HCT: 27.1 % — ABNORMAL LOW (ref 39.0–52.0)
HCT: 27.2 % — ABNORMAL LOW (ref 39.0–52.0)
HCT: 27.8 % — ABNORMAL LOW (ref 39.0–52.0)
Hemoglobin: 5.5 g/dL — CL (ref 13.0–17.0)
Hemoglobin: 6.6 g/dL — CL (ref 13.0–17.0)
Hemoglobin: 9.2 g/dL — ABNORMAL LOW (ref 13.0–17.0)
Hemoglobin: 9.7 g/dL — ABNORMAL LOW (ref 13.0–17.0)
Hemoglobin: 9.7 g/dL — ABNORMAL LOW (ref 13.0–17.0)
MCH: 28.7 pg (ref 26.0–34.0)
MCH: 29.2 pg (ref 26.0–34.0)
MCH: 32.1 pg (ref 26.0–34.0)
MCH: 32.7 pg (ref 26.0–34.0)
MCH: 34.4 pg — ABNORMAL HIGH (ref 26.0–34.0)
MCHC: 33.8 g/dL (ref 30.0–36.0)
MCHC: 34.9 g/dL (ref 30.0–36.0)
MCHC: 35.5 g/dL (ref 30.0–36.0)
MCHC: 35.5 g/dL (ref 30.0–36.0)
MCHC: 35.8 g/dL (ref 30.0–36.0)
MCV: 81.6 fL (ref 80.0–100.0)
MCV: 84.7 fL (ref 80.0–100.0)
MCV: 92.1 fL (ref 80.0–100.0)
MCV: 92.3 fL (ref 80.0–100.0)
MCV: 96.9 fL (ref 80.0–100.0)
Platelets: 100 10*3/uL — ABNORMAL LOW (ref 150–400)
Platelets: 102 10*3/uL — ABNORMAL LOW (ref 150–400)
Platelets: 111 10*3/uL — ABNORMAL LOW (ref 150–400)
Platelets: 81 10*3/uL — ABNORMAL LOW (ref 150–400)
Platelets: 92 10*3/uL — ABNORMAL LOW (ref 150–400)
RBC: 1.68 MIL/uL — ABNORMAL LOW (ref 4.22–5.81)
RBC: 1.92 MIL/uL — ABNORMAL LOW (ref 4.22–5.81)
RBC: 3.02 MIL/uL — ABNORMAL LOW (ref 4.22–5.81)
RBC: 3.21 MIL/uL — ABNORMAL LOW (ref 4.22–5.81)
RBC: 3.32 MIL/uL — ABNORMAL LOW (ref 4.22–5.81)
RDW: 15.3 % (ref 11.5–15.5)
RDW: 15.3 % (ref 11.5–15.5)
RDW: 15.8 % — ABNORMAL HIGH (ref 11.5–15.5)
RDW: 17.8 % — ABNORMAL HIGH (ref 11.5–15.5)
RDW: 18.5 % — ABNORMAL HIGH (ref 11.5–15.5)
WBC: 10.5 10*3/uL (ref 4.0–10.5)
WBC: 11.3 10*3/uL — ABNORMAL HIGH (ref 4.0–10.5)
WBC: 5.6 10*3/uL (ref 4.0–10.5)
WBC: 7.1 10*3/uL (ref 4.0–10.5)
WBC: 7.9 10*3/uL (ref 4.0–10.5)
nRBC: 0.2 % (ref 0.0–0.2)
nRBC: 0.3 % — ABNORMAL HIGH (ref 0.0–0.2)
nRBC: 0.3 % — ABNORMAL HIGH (ref 0.0–0.2)
nRBC: 0.4 % — ABNORMAL HIGH (ref 0.0–0.2)
nRBC: 0.4 % — ABNORMAL HIGH (ref 0.0–0.2)

## 2021-11-05 LAB — POCT I-STAT 7, (LYTES, BLD GAS, ICA,H+H)
Acid-Base Excess: 0 mmol/L (ref 0.0–2.0)
Acid-base deficit: 15 mmol/L — ABNORMAL HIGH (ref 0.0–2.0)
Acid-base deficit: 9 mmol/L — ABNORMAL HIGH (ref 0.0–2.0)
Acid-base deficit: 9 mmol/L — ABNORMAL HIGH (ref 0.0–2.0)
Acid-base deficit: 6 mmol/L — ABNORMAL HIGH (ref 0.0–2.0)
Bicarbonate: 12 mmol/L — ABNORMAL LOW (ref 20.0–28.0)
Bicarbonate: 17.2 mmol/L — ABNORMAL LOW (ref 20.0–28.0)
Bicarbonate: 18.9 mmol/L — ABNORMAL LOW (ref 20.0–28.0)
Bicarbonate: 20.5 mmol/L (ref 20.0–28.0)
Bicarbonate: 24 mmol/L (ref 20.0–28.0)
Calcium, Ion: 0.89 mmol/L — CL (ref 1.15–1.40)
Calcium, Ion: 1.17 mmol/L (ref 1.15–1.40)
Calcium, Ion: 0.98 mmol/L — ABNORMAL LOW (ref 1.15–1.40)
Calcium, Ion: 0.95 mmol/L — ABNORMAL LOW (ref 1.15–1.40)
Calcium, Ion: 0.98 mmol/L — ABNORMAL LOW (ref 1.15–1.40)
HCT: 16 % — ABNORMAL LOW (ref 39.0–52.0)
HCT: 24 % — ABNORMAL LOW (ref 39.0–52.0)
HCT: 25 % — ABNORMAL LOW (ref 39.0–52.0)
HCT: 25 % — ABNORMAL LOW (ref 39.0–52.0)
HCT: 26 % — ABNORMAL LOW (ref 39.0–52.0)
Hemoglobin: 5.4 g/dL — CL (ref 13.0–17.0)
Hemoglobin: 8.2 g/dL — ABNORMAL LOW (ref 13.0–17.0)
Hemoglobin: 8.5 g/dL — ABNORMAL LOW (ref 13.0–17.0)
Hemoglobin: 8.5 g/dL — ABNORMAL LOW (ref 13.0–17.0)
Hemoglobin: 8.8 g/dL — ABNORMAL LOW (ref 13.0–17.0)
O2 Saturation: 91 %
O2 Saturation: 74 %
O2 Saturation: 94 %
O2 Saturation: 96 %
O2 Saturation: 94 %
Patient temperature: 97.6
Patient temperature: 34.2
Patient temperature: 97.4
Potassium: 3.3 mmol/L — ABNORMAL LOW (ref 3.5–5.1)
Potassium: 4.4 mmol/L (ref 3.5–5.1)
Potassium: 3.6 mmol/L (ref 3.5–5.1)
Potassium: 3.6 mmol/L (ref 3.5–5.1)
Potassium: 4 mmol/L (ref 3.5–5.1)
Sodium: 141 mmol/L (ref 135–145)
Sodium: 139 mmol/L (ref 135–145)
Sodium: 142 mmol/L (ref 135–145)
Sodium: 143 mmol/L (ref 135–145)
Sodium: 142 mmol/L (ref 135–145)
TCO2: 13 mmol/L — ABNORMAL LOW (ref 22–32)
TCO2: 18 mmol/L — ABNORMAL LOW (ref 22–32)
TCO2: 20 mmol/L — ABNORMAL LOW (ref 22–32)
TCO2: 22 mmol/L (ref 22–32)
TCO2: 25 mmol/L (ref 22–32)
pCO2 arterial: 29.1 mmHg — ABNORMAL LOW (ref 32.0–48.0)
pCO2 arterial: 39.6 mmHg (ref 32.0–48.0)
pCO2 arterial: 42.4 mmHg (ref 32.0–48.0)
pCO2 arterial: 42.8 mmHg (ref 32.0–48.0)
pCO2 arterial: 34.8 mmHg (ref 32.0–48.0)
pH, Arterial: 7.221 — ABNORMAL LOW (ref 7.350–7.450)
pH, Arterial: 7.246 — ABNORMAL LOW (ref 7.350–7.450)
pH, Arterial: 7.241 — ABNORMAL LOW (ref 7.350–7.450)
pH, Arterial: 7.288 — ABNORMAL LOW (ref 7.350–7.450)
pH, Arterial: 7.445 (ref 7.350–7.450)
pO2, Arterial: 70 mmHg — ABNORMAL LOW (ref 83.0–108.0)
pO2, Arterial: 46 mmHg — ABNORMAL LOW (ref 83.0–108.0)
pO2, Arterial: 74 mmHg — ABNORMAL LOW (ref 83.0–108.0)
pO2, Arterial: 94 mmHg (ref 83.0–108.0)
pO2, Arterial: 63 mmHg — ABNORMAL LOW (ref 83.0–108.0)

## 2021-11-05 LAB — BASIC METABOLIC PANEL
Anion gap: 15 (ref 5–15)
Anion gap: 19 — ABNORMAL HIGH (ref 5–15)
Anion gap: 13 (ref 5–15)
BUN: 10 mg/dL (ref 8–23)
BUN: 10 mg/dL (ref 8–23)
BUN: 11 mg/dL (ref 8–23)
CO2: 15 mmol/L — ABNORMAL LOW (ref 22–32)
CO2: 22 mmol/L (ref 22–32)
CO2: 24 mmol/L (ref 22–32)
Calcium: 6.6 mg/dL — ABNORMAL LOW (ref 8.9–10.3)
Calcium: 7.4 mg/dL — ABNORMAL LOW (ref 8.9–10.3)
Calcium: 7.3 mg/dL — ABNORMAL LOW (ref 8.9–10.3)
Chloride: 105 mmol/L (ref 98–111)
Chloride: 103 mmol/L (ref 98–111)
Chloride: 103 mmol/L (ref 98–111)
Creatinine, Ser: 1.26 mg/dL — ABNORMAL HIGH (ref 0.61–1.24)
Creatinine, Ser: 1.25 mg/dL — ABNORMAL HIGH (ref 0.61–1.24)
Creatinine, Ser: 1.3 mg/dL — ABNORMAL HIGH (ref 0.61–1.24)
GFR, Estimated: 57 mL/min — ABNORMAL LOW (ref >60)
GFR, Estimated: 57 mL/min — ABNORMAL LOW (ref >60)
GFR, Estimated: 55 mL/min — ABNORMAL LOW (ref >60)
Glucose, Bld: 183 mg/dL — ABNORMAL HIGH (ref 70–99)
Glucose, Bld: 177 mg/dL — ABNORMAL HIGH (ref 70–99)
Glucose, Bld: 134 mg/dL — ABNORMAL HIGH (ref 70–99)
Potassium: 4.3 mmol/L (ref 3.5–5.1)
Potassium: 3.7 mmol/L (ref 3.5–5.1)
Potassium: 3.9 mmol/L (ref 3.5–5.1)
Sodium: 135 mmol/L (ref 135–145)
Sodium: 144 mmol/L (ref 135–145)
Sodium: 140 mmol/L (ref 135–145)

## 2021-11-05 LAB — GLOBAL TEG PANEL
CFF Max Amplitude: 6.6 mm — ABNORMAL LOW (ref 15–32)
CK with Heparinase (R): 8.1 min (ref 4.3–8.3)
Citrated Functional Fibrinogen: 159.5 mg/dL — ABNORMAL LOW (ref 278–581)
Citrated Kaolin (K): 3 min — ABNORMAL HIGH (ref 0.8–2.1)
Citrated Kaolin (MA): 40.4 mm — ABNORMAL LOW (ref 52–69)
Citrated Kaolin (R): 8.6 min (ref 4.6–9.1)
Citrated Kaolin Angle: 63.2 deg (ref 63–78)
Citrated Rapid TEG (MA): <40 mm — ABNORMAL LOW (ref 52–70)

## 2021-11-05 LAB — PROTIME-INR
INR: 2.6 — ABNORMAL HIGH (ref 0.8–1.2)
INR: 2 — ABNORMAL HIGH (ref 0.8–1.2)
INR: 1.5 — ABNORMAL HIGH (ref 0.8–1.2)
INR: 1.3 — ABNORMAL HIGH (ref 0.8–1.2)
Prothrombin Time: 27.7 seconds — ABNORMAL HIGH (ref 11.4–15.2)
Prothrombin Time: 22.7 seconds — ABNORMAL HIGH (ref 11.4–15.2)
Prothrombin Time: 18.2 seconds — ABNORMAL HIGH (ref 11.4–15.2)
Prothrombin Time: 16.4 seconds — ABNORMAL HIGH (ref 11.4–15.2)

## 2021-11-05 LAB — GLUCOSE, CAPILLARY
Glucose-Capillary: 112 mg/dL — ABNORMAL HIGH (ref 70–99)
Glucose-Capillary: 122 mg/dL — ABNORMAL HIGH (ref 70–99)
Glucose-Capillary: 122 mg/dL — ABNORMAL HIGH (ref 70–99)
Glucose-Capillary: 137 mg/dL — ABNORMAL HIGH (ref 70–99)
Glucose-Capillary: 138 mg/dL — ABNORMAL HIGH (ref 70–99)
Glucose-Capillary: 143 mg/dL — ABNORMAL HIGH (ref 70–99)
Glucose-Capillary: 160 mg/dL — ABNORMAL HIGH (ref 70–99)
Glucose-Capillary: 168 mg/dL — ABNORMAL HIGH (ref 70–99)
Glucose-Capillary: 170 mg/dL — ABNORMAL HIGH (ref 70–99)
Glucose-Capillary: 170 mg/dL — ABNORMAL HIGH (ref 70–99)
Glucose-Capillary: 172 mg/dL — ABNORMAL HIGH (ref 70–99)
Glucose-Capillary: 180 mg/dL — ABNORMAL HIGH (ref 70–99)
Glucose-Capillary: 193 mg/dL — ABNORMAL HIGH (ref 70–99)
Glucose-Capillary: 91 mg/dL (ref 70–99)
Glucose-Capillary: 95 mg/dL (ref 70–99)
Glucose-Capillary: 97 mg/dL (ref 70–99)
Glucose-Capillary: 98 mg/dL (ref 70–99)

## 2021-11-05 LAB — PREPARE RBC (CROSSMATCH)

## 2021-11-05 LAB — POCT I-STAT, CHEM 8
BUN: 10 mg/dL (ref 8–23)
Calcium, Ion: 1.13 mmol/L — ABNORMAL LOW (ref 1.15–1.40)
Chloride: 104 mmol/L (ref 98–111)
Creatinine, Ser: 1 mg/dL (ref 0.61–1.24)
Glucose, Bld: 204 mg/dL — ABNORMAL HIGH (ref 70–99)
HCT: 27 % — ABNORMAL LOW (ref 39.0–52.0)
Hemoglobin: 9.2 g/dL — ABNORMAL LOW (ref 13.0–17.0)
Potassium: 4.4 mmol/L (ref 3.5–5.1)
Sodium: 139 mmol/L (ref 135–145)
TCO2: 17 mmol/L — ABNORMAL LOW (ref 22–32)

## 2021-11-05 LAB — ECHO INTRAOPERATIVE TEE
Height: 71.5 in
Height: 71.5 in
Weight: 2904 oz
Weight: 2904 oz

## 2021-11-05 LAB — FIBRINOGEN
Fibrinogen: 139 mg/dL — ABNORMAL LOW (ref 210–475)
Fibrinogen: 260 mg/dL (ref 210–475)

## 2021-11-05 LAB — PREPARE PLATELET PHERESIS: Unit division: 0

## 2021-11-05 LAB — MAGNESIUM
Magnesium: 3.1 mg/dL — ABNORMAL HIGH (ref 1.7–2.4)
Magnesium: 2.4 mg/dL (ref 1.7–2.4)
Magnesium: 2.1 mg/dL (ref 1.7–2.4)

## 2021-11-05 LAB — BPAM PLATELET PHERESIS
Blood Product Expiration Date: 202301192359
ISSUE DATE / TIME: 202301171902
Unit Type and Rh: 6200

## 2021-11-05 LAB — APTT
aPTT: 45 seconds — ABNORMAL HIGH (ref 24–36)
aPTT: 37 seconds — ABNORMAL HIGH (ref 24–36)

## 2021-11-05 SURGERY — EXPLORATION POST OPERATIVE OPEN HEART
Anesthesia: General

## 2021-11-05 MED ORDER — SODIUM CHLORIDE 0.9% IV SOLUTION: Freq: Once | INTRAVENOUS | Status: AC

## 2021-11-05 MED ORDER — CEFAZOLIN SODIUM-DEXTROSE 2-4 GM/100ML-% IV SOLN
2.0000 g | INTRAVENOUS | Status: DC
  Filled 2021-11-05: qty 100

## 2021-11-05 MED ORDER — FENTANYL CITRATE (PF) 250 MCG/5ML IJ SOLN
INTRAMUSCULAR | Status: AC
  Filled 2021-11-05: qty 5

## 2021-11-05 MED ORDER — ALBUMIN HUMAN 5 % IV SOLN
INTRAVENOUS | Status: AC
  Administered 2021-11-05: 12.5 g via INTRAVENOUS
  Filled 2021-11-05: qty 500

## 2021-11-05 MED ORDER — SODIUM BICARBONATE 8.4 % IV SOLN
150.0000 meq | Freq: Once | INTRAVENOUS | Status: AC
  Administered 2021-11-05: 150 meq via INTRAVENOUS

## 2021-11-05 MED ORDER — PROPOFOL 10 MG/ML IV BOLUS
INTRAVENOUS | Status: AC
  Filled 2021-11-05: qty 20

## 2021-11-05 MED ORDER — MILRINONE LACTATE IN DEXTROSE 20-5 MG/100ML-% IV SOLN
0.3000 ug/kg/min | INTRAVENOUS | Status: DC
  Filled 2021-11-05: qty 100

## 2021-11-05 MED ORDER — SODIUM CHLORIDE (PF) 0.9 % IJ SOLN
OROMUCOSAL | Status: DC | PRN
  Administered 2021-11-05 (×3): 4 mL via TOPICAL

## 2021-11-05 MED ORDER — SODIUM BICARBONATE 8.4 % IV SOLN
INTRAVENOUS | Status: AC
  Administered 2021-11-05: 50 meq via INTRAVENOUS
  Filled 2021-11-05: qty 50

## 2021-11-05 MED ORDER — PROPOFOL 500 MG/50ML IV EMUL
INTRAVENOUS | Status: DC | PRN
Start: 2021-11-05 — End: 2021-11-05
  Administered 2021-11-05: 50 mg via INTRAVENOUS
  Administered 2021-11-05: 30 mg via INTRAVENOUS

## 2021-11-05 MED ORDER — DEXMEDETOMIDINE HCL IN NACL 400 MCG/100ML IV SOLN
0.1000 ug/kg/h | INTRAVENOUS | Status: DC
  Filled 2021-11-05: qty 100

## 2021-11-05 MED ORDER — TRANEXAMIC ACID (OHS) PUMP PRIME SOLUTION
2.0000 mg/kg | INTRAVENOUS | Status: DC
  Filled 2021-11-05: qty 1.65

## 2021-11-05 MED ORDER — SODIUM CHLORIDE 0.9% IV SOLUTION: Freq: Once | INTRAVENOUS | Status: DC

## 2021-11-05 MED ORDER — SODIUM BICARBONATE 8.4 % IV SOLN
INTRAVENOUS | Status: AC
  Filled 2021-11-05: qty 50

## 2021-11-05 MED ORDER — EPINEPHRINE HCL 5 MG/250ML IV SOLN IN NS
0.0000 ug/min | INTRAVENOUS | Status: DC
  Filled 2021-11-05: qty 250

## 2021-11-05 MED ORDER — HEPARIN 30,000 UNITS/1000 ML (OHS) CELLSAVER SOLUTION
Status: DC
  Filled 2021-11-05: qty 1000

## 2021-11-05 MED ORDER — SODIUM CHLORIDE 0.9% IV SOLUTION
Freq: Once | INTRAVENOUS | Status: AC
  Administered 2021-11-05: 500 mL via INTRAVENOUS

## 2021-11-05 MED ORDER — CHLORHEXIDINE GLUCONATE 0.12 % MT SOLN: 15.0000 mL | Freq: Once | OROMUCOSAL | Status: DC

## 2021-11-05 MED ORDER — TRANEXAMIC ACID 1000 MG/10ML IV SOLN
1.5000 mg/kg/h | INTRAVENOUS | Status: AC
  Administered 2021-11-05: 1.5 mg/kg/h via INTRAVENOUS
  Filled 2021-11-05: qty 25

## 2021-11-05 MED ORDER — LACTATED RINGERS IV SOLN: INTRAVENOUS | Status: DC

## 2021-11-05 MED ORDER — POTASSIUM CHLORIDE 10 MEQ/50ML IV SOLN
10.0000 meq | INTRAVENOUS | Status: AC
  Administered 2021-11-05 (×2): 10 meq via INTRAVENOUS

## 2021-11-05 MED ORDER — SODIUM BICARBONATE 8.4 % IV SOLN: 50.0000 meq | Freq: Once | INTRAVENOUS | Status: AC

## 2021-11-05 MED ORDER — POTASSIUM CHLORIDE 10 MEQ/50ML IV SOLN
10.0000 meq | INTRAVENOUS | Status: AC
  Administered 2021-11-05 (×3): 10 meq via INTRAVENOUS
  Filled 2021-11-05 (×3): qty 50

## 2021-11-05 MED ORDER — TRANEXAMIC ACID (OHS) BOLUS VIA INFUSION
15.0000 mg/kg | INTRAVENOUS | Status: DC
  Filled 2021-11-05: qty 1235

## 2021-11-05 MED ORDER — ORAL CARE MOUTH RINSE: 15.0000 mL | Freq: Once | OROMUCOSAL | Status: DC

## 2021-11-05 MED ORDER — INSULIN REGULAR(HUMAN) IN NACL 100-0.9 UT/100ML-% IV SOLN
INTRAVENOUS | Status: DC
  Filled 2021-11-05: qty 100

## 2021-11-05 MED ORDER — MIDAZOLAM HCL 2 MG/2ML IJ SOLN
INTRAMUSCULAR | Status: AC
  Filled 2021-11-05: qty 2

## 2021-11-05 MED ORDER — PLASMA-LYTE A IV SOLN
INTRAVENOUS | Status: DC
  Filled 2021-11-05: qty 5

## 2021-11-05 MED ORDER — NITROGLYCERIN 0.2 MG/ML ON CALL CATH LAB
INTRAVENOUS | Status: DC | PRN
  Administered 2021-11-05: 40 ug via INTRAVENOUS

## 2021-11-05 MED ORDER — CALCIUM GLUCONATE-NACL 1-0.675 GM/50ML-% IV SOLN
1.0000 g | Freq: Once | INTRAVENOUS | Status: AC
  Administered 2021-11-05: 1000 mg via INTRAVENOUS
  Filled 2021-11-05: qty 50

## 2021-11-05 MED ORDER — ROCURONIUM BROMIDE 100 MG/10ML IV SOLN
INTRAVENOUS | Status: DC | PRN
Start: 2021-11-05 — End: 2021-11-05
  Administered 2021-11-05: 30 mg via INTRAVENOUS
  Administered 2021-11-05 (×2): 50 mg via INTRAVENOUS

## 2021-11-05 MED ORDER — HEMOSTATIC AGENTS (NO CHARGE) OPTIME
TOPICAL | Status: DC | PRN
  Administered 2021-11-05 (×2): 1 via TOPICAL

## 2021-11-05 MED ORDER — VASOPRESSIN 20 UNIT/ML IV SOLN
INTRAVENOUS | Status: AC
  Filled 2021-11-05: qty 1

## 2021-11-05 MED ORDER — PHENYLEPHRINE HCL-NACL 20-0.9 MG/250ML-% IV SOLN
30.0000 ug/min | INTRAVENOUS | Status: DC
  Filled 2021-11-05: qty 250

## 2021-11-05 MED ORDER — CALCIUM CHLORIDE 10 % IV SOLN: INTRAVENOUS | Status: DC | PRN

## 2021-11-05 MED ORDER — ATORVASTATIN CALCIUM 40 MG PO TABS
40.0000 mg | ORAL_TABLET | Freq: Every day | ORAL | Status: DC
  Administered 2021-11-06 – 2021-11-13 (×8): 40 mg
  Filled 2021-11-05 (×8): qty 1

## 2021-11-05 MED ORDER — POTASSIUM CHLORIDE 2 MEQ/ML IV SOLN
80.0000 meq | INTRAVENOUS | Status: DC
  Filled 2021-11-05: qty 40

## 2021-11-05 MED ORDER — SODIUM CHLORIDE 0.9 % IV SOLN: 10.0000 mL/h | Freq: Once | INTRAVENOUS | Status: DC

## 2021-11-05 MED ORDER — SODIUM BICARBONATE 8.4 % IV SOLN
50.0000 meq | Freq: Once | INTRAVENOUS | Status: AC
  Administered 2021-11-05: 50 meq via INTRAVENOUS
  Filled 2021-11-05: qty 50

## 2021-11-05 MED ORDER — CALCIUM CHLORIDE 10 % IV SOLN
INTRAVENOUS | Status: DC | PRN
  Administered 2021-11-05: 1 g via INTRAVENOUS

## 2021-11-05 MED ORDER — NOREPINEPHRINE 4 MG/250ML-% IV SOLN
0.0000 ug/min | INTRAVENOUS | Status: DC
  Filled 2021-11-05: qty 250

## 2021-11-05 MED ORDER — ALBUMIN HUMAN 5 % IV SOLN: INTRAVENOUS | Status: DC | PRN

## 2021-11-05 MED ORDER — VANCOMYCIN HCL 1250 MG/250ML IV SOLN
1250.0000 mg | INTRAVENOUS | Status: DC
  Filled 2021-11-05 (×2): qty 250

## 2021-11-05 MED ORDER — SODIUM CHLORIDE 0.9 % IV SOLN
20.0000 ug | INTRAVENOUS | Status: AC
  Administered 2021-11-05: 20 ug via INTRAVENOUS
  Filled 2021-11-05: qty 5

## 2021-11-05 MED ORDER — 0.9 % SODIUM CHLORIDE (POUR BTL) OPTIME
TOPICAL | Status: DC | PRN
Start: 2021-11-05 — End: 2021-11-05
  Administered 2021-11-05: 5000 mL

## 2021-11-05 MED ORDER — NITROGLYCERIN IN D5W 200-5 MCG/ML-% IV SOLN
2.0000 ug/min | INTRAVENOUS | Status: DC
  Filled 2021-11-05: qty 250

## 2021-11-05 MED ORDER — INSULIN ASPART 100 UNIT/ML IJ SOLN
0.0000 [IU] | INTRAMUSCULAR | Status: DC
  Administered 2021-11-06 (×4): 2 [IU] via SUBCUTANEOUS

## 2021-11-05 MED ORDER — MAGNESIUM SULFATE 50 % IJ SOLN
40.0000 meq | INTRAMUSCULAR | Status: DC
  Filled 2021-11-05: qty 9.85

## 2021-11-05 MED ORDER — EPINEPHRINE 1 MG/10ML IJ SOSY
PREFILLED_SYRINGE | INTRAMUSCULAR | Status: DC | PRN
  Administered 2021-11-05: 300 ug via INTRAVENOUS
  Administered 2021-11-05: 10 ug via INTRAVENOUS

## 2021-11-05 SURGICAL SUPPLY — 68 items
ADAPTER CARDIO PERF ANTE/RETRO (ADAPTER) IMPLANT
ADPR PRFSN 84XANTGRD RTRGD (ADAPTER)
BAG DECANTER FOR FLEXI CONT (MISCELLANEOUS) ×2 IMPLANT
BLADE SURG 11 STRL SS (BLADE) ×1 IMPLANT
BNDG ELASTIC 4X5.8 VLCR STR LF (GAUZE/BANDAGES/DRESSINGS) ×1 IMPLANT
BNDG ELASTIC 6X5.8 VLCR STR LF (GAUZE/BANDAGES/DRESSINGS) ×1 IMPLANT
BNDG GAUZE ELAST 4 BULKY (GAUZE/BANDAGES/DRESSINGS) ×1 IMPLANT
CANISTER SUCT 3000ML PPV (MISCELLANEOUS) ×2 IMPLANT
CONN ST 1/4X3/8  BEN (MISCELLANEOUS) ×3
CONN ST 1/4X3/8 BEN (MISCELLANEOUS) IMPLANT
CONNECTOR Y 1/4X3/8X3/8 STRL (MISCELLANEOUS) ×1 IMPLANT
CONTAINER PROTECT SURGISLUSH (MISCELLANEOUS) ×2 IMPLANT
DRAIN CHANNEL 28F RND 3/8 FF (WOUND CARE) ×3 IMPLANT
DRAPE CARDIOVASCULAR INCISE (DRAPES) ×2
DRAPE SRG 135X102X78XABS (DRAPES) ×1 IMPLANT
DRSG AQUACEL AG ADV 3.5X14 (GAUZE/BANDAGES/DRESSINGS) ×1 IMPLANT
DRSG COVADERM 4X14 (GAUZE/BANDAGES/DRESSINGS) ×2 IMPLANT
ELECT CAUTERY BLADE 6.4 (BLADE) IMPLANT
ELECT REM PT RETURN 9FT ADLT (ELECTROSURGICAL) ×4
ELECTRODE REM PT RTRN 9FT ADLT (ELECTROSURGICAL) ×2 IMPLANT
GAUZE 4X4 16PLY ~~LOC~~+RFID DBL (SPONGE) ×2 IMPLANT
GAUZE SPONGE 4X4 12PLY STRL (GAUZE/BANDAGES/DRESSINGS) ×3 IMPLANT
GAUZE SPONGE 4X4 12PLY STRL LF (GAUZE/BANDAGES/DRESSINGS) ×1 IMPLANT
GLOVE SURG ENC MOIS LTX SZ6 (GLOVE) IMPLANT
GLOVE SURG ENC MOIS LTX SZ6.5 (GLOVE) IMPLANT
GLOVE SURG ENC MOIS LTX SZ7 (GLOVE) IMPLANT
GLOVE SURG NEOP MICRO LF SZ6.5 (GLOVE) IMPLANT
GLOVE SURG SIGNA 7.5 PF LTX (GLOVE) ×4 IMPLANT
GOWN STRL REUS W/ TWL LRG LVL3 (GOWN DISPOSABLE) ×3 IMPLANT
GOWN STRL REUS W/ TWL XL LVL3 (GOWN DISPOSABLE) ×2 IMPLANT
GOWN STRL REUS W/TWL LRG LVL3 (GOWN DISPOSABLE) ×8
GOWN STRL REUS W/TWL XL LVL3 (GOWN DISPOSABLE) ×4
HEMOSTAT SURGICEL 2X14 (HEMOSTASIS) ×1 IMPLANT
KIT BASIN OR (CUSTOM PROCEDURE TRAY) ×2 IMPLANT
KIT SUCTION CATH 14FR (SUCTIONS) ×1 IMPLANT
KIT TURNOVER KIT B (KITS) ×2 IMPLANT
LEAD PACING MYOCARDI (MISCELLANEOUS) ×1 IMPLANT
LINE VENT (MISCELLANEOUS) IMPLANT
NS IRRIG 1000ML POUR BTL (IV SOLUTION) ×10 IMPLANT
PACK CHEST (CUSTOM PROCEDURE TRAY) ×2 IMPLANT
PACK E OPEN HEART (SUTURE) IMPLANT
PAD ARMBOARD 7.5X6 YLW CONV (MISCELLANEOUS) ×4 IMPLANT
PAD ELECT DEFIB RADIOL ZOLL (MISCELLANEOUS) ×2 IMPLANT
POSITIONER HEAD DONUT 9IN (MISCELLANEOUS) ×2 IMPLANT
SEALANT PATCH FIBRIN 2X4IN (MISCELLANEOUS) ×1 IMPLANT
SPONGE T-LAP 18X18 ~~LOC~~+RFID (SPONGE) ×8 IMPLANT
SPONGE T-LAP 4X18 ~~LOC~~+RFID (SPONGE) ×2 IMPLANT
SUT PROLENE 4 0 RB 1 (SUTURE) ×12
SUT PROLENE 4 0 SH DA (SUTURE) ×1 IMPLANT
SUT PROLENE 4-0 RB1 .5 CRCL 36 (SUTURE) IMPLANT
SUT PROLENE 5 0 C 1 36 (SUTURE) ×4 IMPLANT
SUT SILK  1 MH (SUTURE) ×3
SUT SILK 1 MH (SUTURE) ×1 IMPLANT
SUT STEEL 6MS V (SUTURE) IMPLANT
SUT STEEL STERNAL CCS#1 18IN (SUTURE) ×1 IMPLANT
SUT STEEL SZ 6 DBL 3X14 BALL (SUTURE) ×1 IMPLANT
SUT TEM PAC WIRE 2 0 SH (SUTURE) ×4 IMPLANT
SUT VIC AB 1 CTX 36 (SUTURE) ×4
SUT VIC AB 1 CTX36XBRD ANBCTR (SUTURE) ×2 IMPLANT
SUT VIC AB 2-0 CTX 27 (SUTURE) ×4 IMPLANT
SUT VIC AB 3-0 X1 27 (SUTURE) ×4 IMPLANT
SUT VICRYL 4-0 PS2 18IN ABS (SUTURE) IMPLANT
SYSTEM SAHARA CHEST DRAIN ATS (WOUND CARE) IMPLANT
TAPE CLOTH SURG 4X10 WHT LF (GAUZE/BANDAGES/DRESSINGS) ×1 IMPLANT
TAPE PAPER 2X10 WHT MICROPORE (GAUZE/BANDAGES/DRESSINGS) ×1 IMPLANT
TOWEL GREEN STERILE (TOWEL DISPOSABLE) ×2 IMPLANT
UNDERPAD 30X36 HEAVY ABSORB (UNDERPADS AND DIAPERS) ×2 IMPLANT
WATER STERILE IRR 1000ML POUR (IV SOLUTION) ×4 IMPLANT

## 2021-11-05 NOTE — Progress Notes (Signed)
Continued high CT output as well as increased pressor needs again. CVP 6 when pt lying flat. SV 37 on Flotrak, SVR 1116, CO 3.8, CI 1.9. Labs show coagulopathy. MD notified.   Given orders for 4 FFP and 1 platelet.

## 2021-11-05 NOTE — Progress Notes (Signed)
1 Day Post-Op Procedure(s) (LRB): CORONARY ARTERY BYPASS GRAFTING (CABG) X 3, USING LEFT INTERNAL MAMMARY ARTERY AND ENDOSCOPICALLY HARVESTED RIGHT GREATER SAPHENOUS VEIN. LIMA TO LAD, SVG TO PDA, SVG TO OM (N/A) AORTIC VALVE REPLACEMENT (AVR) USING 23 MM INSPIRIS RESILIA  AORTIC VALVE (N/A) TRANSESOPHAGEAL ECHOCARDIOGRAM (TEE) (N/A) ENDOVEIN HARVEST OF GREATER SAPHENOUS VEIN (Right) APPLICATION OF CELL SAVER (N/A) Subjective: Intubated and sedated Bleeding overnight  Objective: Vital signs in last 24 hours: Temp:  [97.5 F (36.4 C)-98.2 F (36.8 C)] 97.5 F (36.4 C) (01/18 0553) Pulse Rate:  [57-104] 92 (01/18 0605) Cardiac Rhythm: A-V Sequential paced (01/18 0000) Resp:  [11-22] 18 (01/18 0605) BP: (78-155)/(42-98) 90/54 (01/18 0600) SpO2:  [88 %-100 %] 100 % (01/18 0605) Arterial Line BP: (64-155)/(38-78) 111/48 (01/18 0605) FiO2 (%):  [50 %-100 %] 100 % (01/18 0453)  Hemodynamic parameters for last 24 hours: CVP:  [6 mmHg-12 mmHg] 10 mmHg  Intake/Output from previous day: 01/17 0701 - 01/18 0700 In: 8910.4 [I.V.:6163.5; Blood:2057; IV Piggyback:689.9] Out: 0962 [EZMOQ:9476; Chest Tube:1760] Intake/Output this shift: Total I/O In: 5505.4 [I.V.:3563.5; Blood:1502; IV Piggyback:439.9] Out: 2145 [Urine:385; Chest Tube:1760]  General appearance: intubated Heart: regular rate and rhythm Lungs: clear to auscultation bilaterally Abdomen: mildly distended Wound: mild drainage inferiorly  Lab Results: Recent Labs    11/04/21 2359 11/05/21 0307 11/05/21 0526  WBC 10.5 11.3*  --   HGB 6.6* 9.7* 5.4*  HCT 18.6* 27.8* 16.0*  PLT 111* 92*  --    BMET:  Recent Labs    11/04/21 0428 11/04/21 1323 11/04/21 1835 11/04/21 1840 11/05/21 0307 11/05/21 0526  NA 136   < > 137   < > 135 141  K 4.0   < > 4.2   < > 4.3 3.3*  CL 103   < > 99  --  105  --   CO2 24  --   --   --  15*  --   GLUCOSE 96   < > 130*  --  183*  --   BUN 11   < > 7*  --  10  --   CREATININE 0.93    < > 0.50*  --  1.26*  --   CALCIUM 9.0  --   --   --  6.6*  --    < > = values in this interval not displayed.    PT/INR:  Recent Labs    11/05/21 0307  LABPROT 27.7*  INR 2.6*   ABG    Component Value Date/Time   PHART 7.221 (L) 11/05/2021 0526   HCO3 12.0 (L) 11/05/2021 0526   TCO2 13 (L) 11/05/2021 0526   ACIDBASEDEF 15.0 (H) 11/05/2021 0526   O2SAT 91.0 11/05/2021 0526   CBG (last 3)  Recent Labs    11/05/21 0314 11/05/21 0355 11/05/21 0459  GLUCAP 168* 170* 170*    Assessment/Plan: S/P Procedure(s) (LRB): CORONARY ARTERY BYPASS GRAFTING (CABG) X 3, USING LEFT INTERNAL MAMMARY ARTERY AND ENDOSCOPICALLY HARVESTED RIGHT GREATER SAPHENOUS VEIN. LIMA TO LAD, SVG TO PDA, SVG TO OM (N/A) AORTIC VALVE REPLACEMENT (AVR) USING 23 MM INSPIRIS RESILIA  AORTIC VALVE (N/A) TRANSESOPHAGEAL ECHOCARDIOGRAM (TEE) (N/A) ENDOVEIN HARVEST OF GREATER SAPHENOUS VEIN (Right) APPLICATION OF CELL SAVER (N/A) POD # 1 AVR, CABG Started bleeding around midnight, Initially had output after repositioning, then tapered off for an hour before increasing again.  Coagulopathic with Pt= 27 and PTT 45- FFP infusing currently Thrombocytopenic with PLT 92K- Platelets have been given Anemia secondary to ABL-  transfused CT output down from 5- 6 AM with resuscitation and blood products Will send repeat labs May need to return to OR Metabolic acidosis despite Flow trac indicating good cardiac output- given bicarb- will repeat ABG at 1 hour May need to return to OR   LOS: 2 days    Melrose Nakayama 11/05/2021

## 2021-11-05 NOTE — Progress Notes (Signed)
NAME:  Joshua [name], MRN:  [id number], DOB:  24-May-1936, LOS: 2 ADMISSION DATE:  11/03/2021, CONSULTATION DATE:  11/05/2021 REFERRING MD:  Kipp Brood , CHIEF COMPLAINT:  post operative ventilator management.   History of Present Illness:  86 year old man who underwent AVR CABG  for severe aortic stenosis who presented with at syncopal episode.   AVA estimated at 0.79cm2 and he was also found to have 3 V CAD with LM involvement.   Pertinent  Medical History   Past Medical History:  Diagnosis Date   Arthritis    Cataract    Esophageal stricture    GERD (gastroesophageal reflux disease)    Hyperlipidemia    Hypertension    Liver disease    hepatitis   Severe aortic stenosis      Significant Hospital Events: Including procedures, antibiotic start and stop dates in addition to other pertinent events   1/17 - Aortic valve replacement with a 23 mm Inspiris valve and Coronary artery bypass grafting x3.  With LIMA to LAD, reverse saphenous vein to PDA, and obtuse marginal. 1/18 - returned to OR for bleeding - no clear source found. Received 3 units of PRBC, 3 FFP, 2 PLT , cryoprecipate.   Interim History / Subjective:  Remains intubated and sedated. Was able to wake up briefly.   Objective   Blood pressure 110/77, pulse 88, temperature (!) 94 F (34.4 C), temperature source Axillary, resp. rate 15, height 5' 11.5" (1.816 m), weight 82.3 kg, SpO2 94 %. CVP:  [6 mmHg-19 mmHg] 11 mmHg  Vent Mode: SIMV;PSV;PRVC FiO2 (%):  [50 %-100 %] 50 % Set Rate:  [12 bmp] 12 bmp Vt Set:  [610 mL] 610 mL PEEP:  [5 cmH20-8 cmH20] 8 cmH20 Pressure Support:  [10 cmH20] 10 cmH20 Plateau Pressure:  [13 cmH20-21 cmH20] 20 cmH20   Intake/Output Summary (Last 24 hours) at 11/05/2021 1648 Last data filed at 11/05/2021 1500 Gross per 24 hour  Intake 13742.6 ml  Output 7320 ml  Net 6422.6 ml   Filed Weights   11/03/21 0851 11/04/21 0400  Weight: 83.9 kg 82.3 kg    Examination: General:  intubated and sedated  HENT: ETT and OGT in place  Lungs: clear bilaterally. Minimal oxygen requirements Cardiovascular: HS distant . Minimal blood staining on dressing. Mediastinal and left pleural tubes in place. Abdomen: soft  Extremities: warm Neuro: sedated  GU: good urine output.  Chest tube output initially dropped to 60/h but has had sudden 535ml drainage in last hour following turn.   Ancillary tests personally reviewed:   Creatinine has increased to 1.25.  HB 9.2 PLT 81 CXR: improving right pleural effusion compared to am.  Assessment & Plan:  Post operative ventilator management  Hemorrhagic shock  Acute blood S/P AVR CABG CAD AKI  Plan:   - Transfuse PLT , repeat CBC - DDAVP for platelet dysfunction - Consider repeat TEG if continues ooze - Reexploration if becomes more hypotensive - currently only on 4 of NE - Increased PEEP to 10 to tamponade bleeding.   Best Practice (right click and "Reselect all SmartList Selections" daily)   Diet/type: NPO DVT prophylaxis: not indicated GI prophylaxis: H2B Lines: Central line and Arterial Line Foley:  Yes, and it is still needed Code Status:  full code Last date of multidisciplinary goals of care discussion [per primary team]  CRITICAL CARE Performed by: Kipp Brood   Total critical care time: 35 minutes  Critical care time was exclusive of separately billable procedures  and treating other patients.  Critical care was necessary to treat or prevent imminent or life-threatening deterioration.  Critical care was time spent personally by me on the following activities: development of treatment plan with patient and/or surrogate as well as nursing, discussions with consultants, evaluation of patient's response to treatment, examination of patient, obtaining history from patient or surrogate, ordering and performing treatments and interventions, ordering and review of laboratory studies, ordering and review of  radiographic studies, pulse oximetry, re-evaluation of patient's condition and participation in multidisciplinary rounds.  Kipp Brood, MD [facility] ICU Physician [place]  Pager: [phone] Mobile: [phone] After hours: [phone].

## 2021-11-05 NOTE — Progress Notes (Signed)
1640: Patient was turned post bath to the right side and dumped 500cc out of chest tubes. Dr Roxan Hockey made aware and gave verbal order for STAT CBC,BMET, and coags. Along with a STAT chest xray and a unit of platelets. Patient's vitals were stable post dump and MD came to the bedside to assess patient.

## 2021-11-05 NOTE — Plan of Care (Signed)

## 2021-11-05 NOTE — Progress Notes (Signed)
Patient ID: Joshua [name], male   DOB: June 13, 1936, 86 y.o.   MRN: [id number] [facility] Evening Rounds:  He was hemodynamically stable today and weaning vent but then put out 500 cc from chest tubes. After that only 30 cc last hr and it is thin and serosanguinous. There is no clot in tubes.  CBC    Component Value Date/Time   WBC 7.1 11/05/2021 1649   RBC 3.32 (L) 11/05/2021 1649   HGB 9.7 (L) 11/05/2021 1649   HGB 14.3 10/29/2021 1244   HCT 27.1 (L) 11/05/2021 1649   HCT 40.4 10/29/2021 1244   PLT 100 (L) 11/05/2021 1649   PLT 147 (L) 10/29/2021 1244   MCV 81.6 11/05/2021 1649   MCV 95 10/29/2021 1244   MCH 29.2 11/05/2021 1649   MCHC 35.8 11/05/2021 1649   RDW 18.5 (H) 11/05/2021 1649   RDW 11.1 (L) 10/29/2021 1244   LYMPHSABS 1,312 08/01/2021 1626   MONOABS 0.5 04/29/2020 1413   EOSABS 281 08/01/2021 1626   BASOSABS 49 08/01/2021 1626   Transfusing plts.  INR 1.3 Fibrinogen was 260 postop.  Good urine output.  Will keep on vent overnight and wean early morning if chest tube output remains low.

## 2021-11-05 NOTE — Interval H&P Note (Signed)
History and Physical Interval Note:  Bleeding overnight. Continues to bleed despite correction (partial so far) of coagulation abnormalities.  Needs reexploration for bleeding  11/05/2021 6:58 AM  Franco Collet  has presented today for surgery, with the diagnosis of bleeding.  The various methods of treatment have been discussed with the patient and family. After consideration of risks, benefits and other options for treatment, the patient has consented to  Procedure(s): EXPLORATION POST OPERATIVE OPEN HEARTFOR BLEEDING (N/A) as a surgical intervention.  The patient's history has been reviewed, patient examined, no change in status, stable for surgery.  I have reviewed the patient's chart and labs.  Questions were answered to the patient's satisfaction.     Melrose Nakayama

## 2021-11-05 NOTE — Progress Notes (Signed)
Cardiology Consultation:  Patient ID: Joshua [name] MRN: [id number]; DOB: [birth year]  Admit date: 11/03/2021 Date of Consult: 11/05/2021  Primary Care Provider: Shelda Pal, DO Primary Cardiologist: None  Primary Electrophysiologist:  None   Patient Profile:   Joshua [name] is a 86 y.o. male with a hx of aortic stenosis, HTN, HLD, PVCs who is being seen for the evaluation of aortic stenosis.   History of Present Illness:   Overnight events: CABG with AVR yesterday afternoon with no immediate complications noted. Around midnight, chest tube output was noticed to be significantly increased with decreased hemoglobin down to 5.5. Pressor support requirement increased. Patient received 2 units of pRBCs, 4 units of FFP and 1 unit of pheresed platelets overnight. This AM, patient returned to OR for exploratory procedure. During surgery, he received additional 3 units of pRBCs, 3 units of FFP, 2 units of pheresed platelets and 1 unit of cryo.   This AM, patient is sedated. Pressor support has been weaned with improve in BP.    Inpatient Medications: Scheduled Meds:  sodium chloride   Intravenous Once   sodium chloride   Intravenous Once   sodium chloride   Intravenous Once   sodium chloride   Intravenous Once   sodium chloride   Intravenous Once   acetaminophen  1,000 mg Oral Q6H   Or   acetaminophen (TYLENOL) oral liquid 160 mg/5 mL  1,000 mg Per Tube Q6H   aspirin EC  325 mg Oral Daily   Or   aspirin  324 mg Per Tube Daily   atorvastatin  40 mg Oral Daily   bisacodyl  10 mg Oral Daily   Or   bisacodyl  10 mg Rectal Daily   chlorhexidine gluconate (MEDLINE KIT)  15 mL Mouth Rinse BID   Chlorhexidine Gluconate Cloth  6 each Topical Daily   docusate sodium  200 mg Oral Daily   mouth rinse  15 mL Mouth Rinse 10 times per day   metoCLOPramide (REGLAN) injection  10 mg Intravenous Q6H   metoprolol tartrate  12.5 mg Oral BID   Or   metoprolol tartrate  12.5 mg Per  Tube BID   [START ON 11/06/2021] pantoprazole  40 mg Oral Daily   sodium bicarbonate       sodium chloride flush  3 mL Intravenous Q12H   Continuous Infusions:  sodium chloride Stopped (11/04/21 2015)   sodium chloride     sodium chloride     sodium chloride      ceFAZolin (ANCEF) IV 200 mL/hr at 11/05/21 0700   dexmedetomidine (PRECEDEX) IV infusion 0.6 mcg/kg/hr (11/05/21 0735)   epinephrine 2 mcg/min (11/05/21 0921)   famotidine (PEPCID) IV Stopped (11/04/21 2148)   insulin 6 Units/hr (11/05/21 4081)   lactated ringers     lactated ringers 20 mL/hr at 11/05/21 0735   milrinone     nitroGLYCERIN 0 mcg/min (11/04/21 1941)   norepinephrine (LEVOPHED) Adult infusion 2 mcg/min (11/05/21 0918)   phenylephrine (NEO-SYNEPHRINE) Adult infusion Stopped (11/05/21 0532)   PRN Meds: sodium chloride, dextrose, metoprolol tartrate, midazolam, morphine injection, ondansetron (ZOFRAN) IV, oxyCODONE, sodium chloride flush, traMADol  Allergies:    No Known Allergies  ROS:  All other ROS reviewed and negative. Pertinent positives noted in the HPI.     Physical Exam/Data:   Vitals:   11/05/21 0721 11/05/21 0727 11/05/21 0730 11/05/21 1029  BP:    (!) 166/81  Pulse: 88 88 88   Resp: 20 16 16  Temp:  (!) 97.5 F (36.4 C)    TempSrc:  Oral    SpO2: 99% 100% 98%   Weight:      Height:        Intake/Output Summary (Last 24 hours) at 11/05/2021 1049 Last data filed at 11/05/2021 1040 Gross per 24 hour  Intake 14221.35 ml  Output 7815 ml  Net 6406.35 ml    Last 3 Weights 11/04/2021 11/03/2021 10/29/2021  Weight (lbs) 181 lb 8 oz 185 lb 187 lb  Weight (kg) 82.328 kg 83.915 kg 84.823 kg    Body mass index is 24.96 kg/m.   General: Critically ill appearing gentleman. Sedated.  Head: Atraumatic, normal size  Neck: Supple, no JVD Cardiac: Regular rate and rhythm. No murmurs. Atrial and ventricular pacing with rates in the 80s.  Lungs: Clear to auscultation bilaterally, no wheezing, rhonchi  or rales  Abd: Soft, nontender, no hepatomegaly  Ext: No edema, RP 2+ bilaterally. DP pulses diminished.  Musculoskeletal: No deformities, BUE and BLE strength testing limited by sedation.  Skin: Warm and dry, no rashes   Neuro: Sedated.  Psych: Normal mood and affect   EKG:  The EKG was personally reviewed and demonstrates:  AV pacing with rate of 80 Telemetry:  Telemetry was personally reviewed and demonstrates:  AV pacing with rates in the 80s.   Relevant CV Studies:  Echocardiogram (10/08/2021) IMPRESSIONS  The aortic valve was not well visualized. Aortic valve regurgitation is not visualized. Severe aortic valve stenosis. Aortic valve area, by VTI measures 0.90 cm. Aortic valve mean gradient measures 58.0 mmHg. Aortic valve Vmax measures 4.89 m/s. 2. Left ventricular ejection fraction, by estimation, is 60 to 65%. The left ventricle has normal function. The left ventricle has no regional wall motion abnormalities. There is mild concentric left ventricular hypertrophy. Left ventricular diastolic parameters are consistent with Grade I diastolic dysfunction (impaired relaxation). There is a small intracavitary < 20 mmHg with Valsalva. 3. Right ventricular systolic function is normal. The right ventricular size is normal. There is normal pulmonary artery systolic pressure. 4. Left atrial size was mildly dilated. 5. The mitral valve is degenerative. No evidence of mitral valve regurgitation.  Comparison(s): EF 50%, AS mean gradient 28 mmHG, peak 46 mmHg. Interval increase to severe aortic stenosis.  Right/Left Heart Cath (11/03/2021)    Mid RCA lesion is 99% stenosed.   Prox RCA lesion is 60% stenosed.   Mid LM lesion is 40% stenosed.   Ost LAD to Prox LAD lesion is 70% stenosed.   Prox LAD to Mid LAD lesion is 40% stenosed.   Prox Cx to Mid Cx lesion is 40% stenosed.   Mild to moderate distal left main stenosis.  Severe, heavily calcified ostial LAD stenosis (RFR 0.78  suggesting the lesion is flow limiting).  Mild mid Circumflex stenosis Large dominant RCA with eccentric, nodular calcific lesion in the proximal vessel. This is a moderate stenosis. The mid RCA has a severe heavily calcified stenosis.  Normal right heart pressures Severe aortic stenosis by echo (aortic valve not crossed today)   Recommendations: He has severe, heavily calcified CAD involving the ostial LAD and the mid RCA. PCI of the LAD lesion would be high risk. I have reviewed his films with our interventional and structural team and I think the best option is to consider CABG and AVR. Given recent syncope, will admit him to the telemetry unit and have CT surgery see him to review options for CABG with surgical AVR. He appears to  be a good candidate for surgery  Laboratory Data: High Sensitivity Troponin:  No results for input(s): TROPONINIHS in the last 720 hours.   Cardiac EnzymesNo results for input(s): TROPONINI in the last 168 hours. No results for input(s): TROPIPOC in the last 168 hours.  Chemistry Recent Labs  Lab 11/03/21 2003 11/04/21 0428 11/04/21 1323 11/04/21 1835 11/04/21 1840 11/05/21 0307 11/05/21 0526 11/05/21 0809 11/05/21 0849 11/05/21 1032  NA 134* 136   < > 137   < > 135   < > 139 142 143  K 4.0 4.0   < > 4.2   < > 4.3   < > 4.4 3.6 3.6  CL 101 103   < > 99  --  105  --  104  --   --   CO2 21* 24  --   --   --  15*  --   --   --   --   GLUCOSE 149* 96   < > 130*  --  183*  --  204*  --   --   BUN 12 11   < > 7*  --  10  --  10  --   --   CREATININE 0.95 0.93   < > 0.50*  --  1.26*  --  1.00  --   --   CALCIUM 8.9 9.0  --   --   --  6.6*  --   --   --   --   GFRNONAA >60 >60  --   --   --  57*  --   --   --   --   ANIONGAP 12 9  --   --   --  15  --   --   --   --    < > = values in this interval not displayed.    Recent Labs  Lab 11/03/21 2003  PROT 6.3*  ALBUMIN 3.5  AST 36  ALT 24  ALKPHOS 55  BILITOT 1.7*   Hematology Recent Labs  Lab  11/04/21 2359 11/05/21 0307 11/05/21 0526 11/05/21 0618 11/05/21 0806 11/05/21 0809 11/05/21 0849 11/05/21 1032  WBC 10.5 11.3*  --  7.9  --   --   --   --   RBC 1.92* 3.02*  --  1.68*  --   --   --   --   HGB 6.6* 9.7*   < > 5.5*   < > 9.2* 8.5* 8.5*  HCT 18.6* 27.8*   < > 15.5*   < > 27.0* 25.0* 25.0*  MCV 96.9 92.1  --  92.3  --   --   --   --   MCH 34.4* 32.1  --  32.7  --   --   --   --   MCHC 35.5 34.9  --  35.5  --   --   --   --   RDW 15.3 15.3  --  15.8*  --   --   --   --   PLT 111* 92*  --  102*  --   --   --   --    < > = values in this interval not displayed.   BNPNo results for input(s): BNP, PROBNP in the last 168 hours.  DDimer No results for input(s): DDIMER in the last 168 hours.  Radiology/Studies:  CARDIAC CATHETERIZATION  Result Date: 11/03/2021   Mid RCA lesion is 99% stenosed.   Prox  RCA lesion is 60% stenosed.   Mid LM lesion is 40% stenosed.   Ost LAD to Prox LAD lesion is 70% stenosed.   Prox LAD to Mid LAD lesion is 40% stenosed.   Prox Cx to Mid Cx lesion is 40% stenosed. Mild to moderate distal left main stenosis. Severe, heavily calcified ostial LAD stenosis (RFR 0.78 suggesting the lesion is flow limiting). Mild mid Circumflex stenosis Large dominant RCA with eccentric, nodular calcific lesion in the proximal vessel. This is a moderate stenosis. The mid RCA has a severe heavily calcified stenosis. Normal right heart pressures Severe aortic stenosis by echo (aortic valve not crossed today) Recommendations: He has severe, heavily calcified CAD involving the ostial LAD and the mid RCA. PCI of the LAD lesion would be high risk. I have reviewed his films with our interventional and structural team and I think the best option is to consider CABG and AVR. Given recent syncope, will admit him to the telemetry unit and have CT surgery see him to review options for CABG with surgical AVR. He appears to be a good candidate for surgery.   DG Chest Port 1 View  Result  Date: 11/05/2021 CLINICAL DATA:  Status post open heart surgery. Endotracheal tube and chest tubes. EXAM: PORTABLE CHEST 1 VIEW COMPARISON:  Multiple prior chest radiographs including most recent dated November 04, 2021 FINDINGS: Endotracheal tube with distal tip approximately 3.5 cm above the carina, right IJ access central line, feeding tube and mediastinal and left pleural drain are unchanged. The heart is enlarged with evidence of CABG and prosthetic aortic valve. No appreciable pneumothorax. Bilateral lower lobe hazy opacities concerning for pleural effusion, underlying airspace disease can not be excluded. No acute osseous abnormality. IMPRESSION: 1.  Lines and tubes as above and unchanged. 2.   Stable cardiomegaly with postsurgical changes. 3. Small bilateral pleural effusions, right greater than the left with right basilar atelectasis, underlying airspace disease can not be excluded. Electronically Signed   By: Keane Police D.O.   On: 11/05/2021 09:01   DG Chest Port 1 View  Result Date: 11/04/2021 CLINICAL DATA:  Status post CABG, status post AVR. EXAM: PORTABLE CHEST 1 VIEW COMPARISON:  Chest x-ray 11/03/2021. FINDINGS: Endotracheal tube tip is 3.8 cm above carina. Enteric tube extends below the diaphragm. Right-sided central venous catheter tip projects over the proximal SVC. There are new sternotomy wires, prosthetic heart valve and mediastinal clips. Mediastinal and left pleural drainage catheters are also new from prior study. Cardiomediastinal silhouette is enlarged compatible with recent cardiac surgery. Lung volumes are low. There is minimal left basilar atelectasis. There is no pleural effusion or pneumothorax. No acute fractures are seen. IMPRESSION: 1. Low lung volumes with left basilar atelectasis. 2. Endotracheal tube tip 3.8 cm above carina. 3. Other lines and tubes/postsurgical changes as above. Electronically Signed   By: Ronney Asters M.D.   On: 11/04/2021 20:13   DG CHEST PORT 1  VIEW  Result Date: 11/03/2021 CLINICAL DATA:  Preop evaluation for upcoming coronary bypass grafting EXAM: PORTABLE CHEST 1 VIEW COMPARISON:  07/11/2016, CT from 10/31/2021 FINDINGS: Cardiac shadow is within normal limits. Mild aortic calcifications are seen. The lungs are well aerated bilaterally. No focal infiltrate or sizable effusion is seen. No bony abnormality is noted. IMPRESSION: No acute abnormality noted. Electronically Signed   By: Inez Catalina M.D.   On: 11/03/2021 21:13   VAS US DOPPLER PRE CABG  Result Date: 11/04/2021 PREOPERATIVE VASCULAR EVALUATION Patient Name:  JAHMARI ESBENSHADE  Date of Exam:   11/03/2021 Medical Rec #: [id number]         Accession #:    [id number] Date of Birth: Dec 17, 1935        Patient Gender: M Patient Age:   6 years Exam Location:  [facility] Procedure:      VAS US DOPPLER PRE CABG Referring Phys: Gilford Raid --------------------------------------------------------------------------------  Indications:  Pre-CABG. Risk Factors: Hypertension, hyperlipidemia. Performing Technologist: Archie Patten RVS  Examination Guidelines: A complete evaluation includes B-mode imaging, spectral Doppler, color Doppler, and power Doppler as needed of all accessible portions of each vessel. Bilateral testing is considered an integral part of a complete examination. Limited examinations for reoccurring indications may be performed as noted.  Right Carotid Findings: +----------+--------+--------+--------+-------------------------+---------+             PSV cm/s EDV cm/s Stenosis Describe                  Comments   +----------+--------+--------+--------+-------------------------+---------+  CCA Prox   95       21                heterogenous                         +----------+--------+--------+--------+-------------------------+---------+  CCA Distal 63       14                heterogenous and calcific             +----------+--------+--------+--------+-------------------------+---------+  ICA Prox   82       27       1-39%    heterogenous              Shadowing  +----------+--------+--------+--------+-------------------------+---------+  ICA Distal 96       27                                                     +----------+--------+--------+--------+-------------------------+---------+  ECA        75       7                                                      +----------+--------+--------+--------+-------------------------+---------+ +----------+--------+-------+--------+------------+             PSV cm/s EDV cms Describe Arm Pressure  +----------+--------+-------+--------+------------+  Subclavian 94                                      +----------+--------+-------+--------+------------+ +---------+--------+--+--------+-+---------+  Vertebral PSV cm/s 38 EDV cm/s 9 Antegrade  +---------+--------+--+--------+-+---------+ Left Carotid Findings: +----------+--------+--------+--------+------------+--------+             PSV cm/s EDV cm/s Stenosis Describe     Comments  +----------+--------+--------+--------+------------+--------+  CCA Prox   111      27                heterogenous           +----------+--------+--------+--------+------------+--------+  CCA Distal 70       17  heterogenous           +----------+--------+--------+--------+------------+--------+  ICA Prox   84       27       1-39%    heterogenous           +----------+--------+--------+--------+------------+--------+  ICA Distal 69       22                                       +----------+--------+--------+--------+------------+--------+  ECA        75       11                                       +----------+--------+--------+--------+------------+--------+ +----------+--------+--------+--------+------------+  Subclavian PSV cm/s EDV cm/s Describe Arm Pressure  +----------+--------+--------+--------+------------+             115                                       +----------+--------+--------+--------+------------+ +---------+--------+--+--------+--+---------+  Vertebral PSV cm/s 39 EDV cm/s 11 Antegrade  +---------+--------+--+--------+--+---------+  ABI Findings: +---------+------------------+-----+----------+--------+  Right     Rt Pressure (mmHg) Index Waveform   Comment   +---------+------------------+-----+----------+--------+  Brachial  148                      triphasic            +---------+------------------+-----+----------+--------+  PTA       255                1.72  monophasic           +---------+------------------+-----+----------+--------+  DP        147                0.99  monophasic           +---------+------------------+-----+----------+--------+  Great Toe 88                 0.59                       +---------+------------------+-----+----------+--------+ +---------+------------------+-----+---------+-------+  Left      Lt Pressure (mmHg) Index Waveform  Comment  +---------+------------------+-----+---------+-------+  Brachial  131                      triphasic          +---------+------------------+-----+---------+-------+  PTA       171                1.16  biphasic           +---------+------------------+-----+---------+-------+  DP        123                0.83  biphasic           +---------+------------------+-----+---------+-------+  Great Toe 100                0.68                     +---------+------------------+-----+---------+-------+ +-------+---------------+----------------+  ABI/TBI Today's ABI/TBI Previous ABI/TBI  +-------+---------------+----------------+  Right   1.72  0.59              +-------+---------------+----------------+  Left    1.16            0.68              +-------+---------------+----------------+  Right Doppler Findings: +--------+--------+-----+---------+--------+  Site     Pressure Index Doppler   Comments  +--------+--------+-----+---------+--------+  Brachial 148             triphasic           +--------+--------+-----+---------+--------+  Radial                  triphasic           +--------+--------+-----+---------+--------+  Ulnar                   triphasic           +--------+--------+-----+---------+--------+  Left Doppler Findings: +--------+--------+-----+---------+--------+  Site     Pressure Index Doppler   Comments  +--------+--------+-----+---------+--------+  Brachial 131            triphasic           +--------+--------+-----+---------+--------+  Radial                  triphasic           +--------+--------+-----+---------+--------+  Ulnar                   triphasic           +--------+--------+-----+---------+--------+  Summary: Right Carotid: Velocities in the right ICA are consistent with a 1-39% stenosis. Left Carotid: Velocities in the left ICA are consistent with a 1-39% stenosis. Vertebrals: Bilateral vertebral arteries demonstrate antegrade flow. Right ABI: Resting right ankle-brachial index indicates noncompressible right lower extremity arteries. The right toe-brachial index is abnormal. Left ABI: Resting left ankle-brachial index is within normal range. No evidence of significant left lower extremity arterial disease. The left toe-brachial index is abnormal. Right Upper Extremity: Unable to obtain allens test due to TR band. Left Upper Extremity: Doppler waveform obliterate with left radial compression. Doppler waveforms remain within normal limits with left ulnar compression.  Electronically signed by Deitra Mayo MD on 11/04/2021 at 3:31:06 PM.    Final     Assessment and Plan:   # Severe 3-vessel CAD s/p CABG (LIMA-LAD, SVG-PDA, SVG-OM)  No past medical history of known CAD prior to admission. R/L heart cath pursued as part of AVR pre-op evaluation with results demonstrating severe 3-vessel disease. Given heavy calcification PCI considered high risk. TCTS consulted and CABG pursued on 1/17. Unfortunately, post-op period complicated by high chest  tube output and severe blood loss anemia.   - CABG post-op care per TCTS  - Continue daily high dose Aspirin  - Continue high intensity statin   # Severe Aortic Stenosis s/p AVR  Patient diagnosed with aortic stenosis in 2017, graded as moderate. In 2022, referred to cardiology after syncopal episode with repeat cardiac imaging demonstrating severe aortic stenosis with aortic valve gradient of 58 mmHg and aortic valve area measuring 0.90 cm2. AVR with bioprosthetic valve (23 mm Inspiris Resilia) performed on 1/17.   - AVR post-op care per TCTS  # Severe blood loss anemia # Thrombocytopenia  In the setting of cardiothoracic surgery and associated coagulapathy. S/p pRBCs, FFP, platelets, and cryo. Most recent hemoglobin improved to  - Management per TCTS   # Acute Hypoxic Respiratory Failure In the setting of CABG -  Full vent support  - Precedex gtt for sedation   # Hypotension  # History of Hypertension  Hypotension improving at this time - pressor support has been weaned post-op this AM.  - Continue Levophed, Epi and Neo PRN - Wean as tolerated  - Holding home anti-hypertensives, including Lisinopril   # Acute Kidney Injury Likely pre-renal in the setting of ABLA.  - Trend renal indices  - Strict in/out  # Anion-Gap Metabolic Acidosis Overnight, patient's bicarb decreased to 15 with increase of AG to 15. Suspect lactic acidosis in the setting of rapid ABLA. Improving at this time.   # Hyperlipidemia  - Continue Atorvastatin 40 mg daily per tube    For questions or updates, please contact [place] Please consult [email or web address] for contact info under   Signed, Dr. Jose Persia Internal Medicine PGY-3  11/05/2021, 10:49 AM  Patient seen and examined and agree with Jose Persia, MD as detailed above.  In brief, the patient is a 86 year old male with history of severe aortic stenosis, HTN, HLD and PVCs who presented for planned cath as part of TAVR work-up found  to have multivessel CAD now s/p 3v CABG with LIMA-LAD, SVG-PDA, SVG-OM and AVR with 47m inspiris resilia AoV. Post-op course complicated by acute blood loss anemia requiring take back to OR for re-look this AM. Has received 5upRBC, 7u FFP, 3 units pheresed plt and 1 unit of cryo.  Currently back in the ICU. Weaned off pressors. CVP about 8 with good CO (4.5L/min). BiV paced.   GEN: Intubated and sedated   Neck: CVL in place Cardiac: RRR, soft systolic murmur Respiratory: Clear anteriorly. CT in place GI: Soft, nondistended MS: No edema; No deformity. Neuro:  Nonfocal  Psych: Normal affect    Plan: -S/p CABG +AVR with surgery with post-op course complicated by acute blood loss anemia s/p re-look this AM -Doing better, weaned off pressors and good UOP -Post-op management per CV surgery -Currently BiV pacing -ASA and statin -Will add GDMT as able in the future  HGwyndolyn Kaufman MD

## 2021-11-05 NOTE — Progress Notes (Signed)
°  Echocardiogram Echocardiogram Transesophageal has been performed.  Joshua [name] 11/05/2021, 10:58 AM

## 2021-11-05 NOTE — Progress Notes (Signed)
Pt transporting to OR with OR team and Respiratory. Report given to CRNA and day nurse Minette Brine.

## 2021-11-05 NOTE — Brief Op Note (Signed)
11/03/2021 - 11/05/2021  9:30 AM  PATIENT:  Joshua [name]  85 y.o. male  PRE-OPERATIVE DIAGNOSIS:  Bleeding  POST-OPERATIVE DIAGNOSIS:  Bleeding  PROCEDURE:  EXPLORATION POST OPERATIVE OPEN HEART  SURGEON:  Surgeon(s) and Role:    Melrose Nakayama, MD - Primary  PHYSICIAN ASSISTANT: Lars Pinks PA-C  ASSISTANTS: Despina Arias RNFA   ANESTHESIA:   general  EBL:  Per anesthesia, perfusion record  BLOOD ADMINISTERED: 3 CC PRBC, 3 FFP, and 2 PLTS, 1 Cryo, and Albumin 250  DRAINS:  1 Chest tube  placed in the left pleural space and 2 mediastinal chest tubes  COUNTS CORRECT:  YES  DICTATION: .Dragon Dictation  PLAN OF CARE: Admit to inpatient   PATIENT DISPOSITION:  ICU - intubated and hemodynamically stable.   Delay start of Pharmacological VTE agent (>24hrs) due to surgical blood loss or risk of bleeding: yes

## 2021-11-05 NOTE — Progress Notes (Signed)
° °   °  [address]Suite [address]       [place] [address]             [phone]      Bleeding has slowed down some with correction of coagulation abnormalities, but still > 100/hr.   Pt improved to 20 - additional FFp ordered Hgb 5.5- additional PRBC ordered  He has intermittently lost capture with pacemaker leads.  He needs to return to OR for reexploration for bleeding. I have discussed this with his son Rollen Selders. He gives consent. OR has been notified.  Revonda Standard Roxan Hockey, MD [facility] [phone]

## 2021-11-05 NOTE — Progress Notes (Signed)
Pt having increased pressor requirement, had already received all PRN albumins/IVF bolus. CT dumped 100cc s/p repositioning at 2330, then 200cc more in the subsequent 39min. UOP decreasing over past few hours. Mucosa pale. CVP 6-7.   Dr. Roxan Hockey notified of these findings, given orders for 2u pRBC.

## 2021-11-05 NOTE — Anesthesia Preprocedure Evaluation (Addendum)
Anesthesia Evaluation  Patient identified by MRN, date of birth, ID band Patient unresponsive    Reviewed: Allergy & Precautions, NPO status , Patient's Chart, lab work & pertinent test results, Unable to perform ROS - Chart review onlyPreop documentation limited or incomplete due to emergent nature of procedure.  Airway Mallampati: Intubated  TM Distance: >3 FB Neck ROM: Full    Dental  (+) Teeth Intact, Dental Advisory Given   Pulmonary former smoker,    breath sounds clear to auscultation   + intubated    Cardiovascular hypertension, Normal cardiovascular exam Rhythm:Regular Rate:Normal  POD1 s/p CABG, AVR   Neuro/Psych negative neurological ROS  negative psych ROS   GI/Hepatic Neg liver ROS, GERD  ,  Endo/Other  negative endocrine ROS  Renal/GU negative Renal ROS     Musculoskeletal  (+) Arthritis ,   Abdominal   Peds  Hematology  (+) Blood dyscrasia (Thrombocytopenia), anemia ,   Anesthesia Other Findings Day of surgery medications reviewed with the patient.  Reproductive/Obstetrics                             Anesthesia Physical Anesthesia Plan  ASA: 5 and emergent  Anesthesia Plan: General   Post-op Pain Management:    Induction: Intravenous  PONV Risk Score and Plan: 2 and Treatment may vary due to age or medical condition  Airway Management Planned: Oral ETT  Additional Equipment: TEE, Arterial line and CVP  Intra-op Plan:   Post-operative Plan: Post-operative intubation/ventilation  Informed Consent: I have reviewed the patients History and Physical, chart, labs and discussed the procedure including the risks, benefits and alternatives for the proposed anesthesia with the patient or authorized representative who has indicated his/her understanding and acceptance.     Only emergency history available  Plan Discussed with:   Anesthesia Plan Comments: (TEE FOR  INTRA-OP MONITORING ONLY)       Anesthesia Quick Evaluation

## 2021-11-05 NOTE — Anesthesia Postprocedure Evaluation (Signed)
Anesthesia Post Note  Patient: Joshua [name]  Procedure(s) Performed: EXPLORATION POST OPERATIVE OPEN HEARTFOR BLEEDING     Patient location during evaluation: SICU Anesthesia Type: General Level of consciousness: sedated Pain management: pain level controlled Vital Signs Assessment: vitals unstable Respiratory status: patient remains intubated per anesthesia plan Cardiovascular status: unstable Postop Assessment: no apparent nausea or vomiting Anesthetic complications: no   No notable events documented.  Last Vitals:  Vitals:   11/05/21 1500 11/05/21 1540  BP: 110/77   Pulse: 88 88  Resp: 12 15  Temp:    SpO2: 94% 93%    Last Pain:  Vitals:   11/05/21 1100  TempSrc: Axillary  PainSc:                  Santa Lighter

## 2021-11-05 NOTE — Care Management (Signed)
°  Transition of Care [facility]) Screening Note   Patient Details  Name: ALFREDO SPONG Date of Birth: 1935-11-16   Transition of Care [facility]) CM/SW Contact:    Bethena Roys, RN Phone Number: 11/05/2021, 1:19 PM    Transition of Care Department [facility]) has reviewed the patient and no TOC needs have been identified at this time. We will continue to monitor patient advancement through interdisciplinary progression rounds. If new patient transition needs arise, please place a TOC consult.

## 2021-11-05 NOTE — Progress Notes (Signed)
[address]       [place] [address]             [phone]      1 Day Post-Op Procedure(s) (LRB): CORONARY ARTERY BYPASS GRAFTING (CABG) X 3, USING LEFT INTERNAL MAMMARY ARTERY AND ENDOSCOPICALLY HARVESTED RIGHT GREATER SAPHENOUS VEIN. LIMA TO LAD, SVG TO PDA, SVG TO OM (N/A) AORTIC VALVE REPLACEMENT (AVR) USING 23 MM INSPIRIS RESILIA  AORTIC VALVE (N/A) TRANSESOPHAGEAL ECHOCARDIOGRAM (TEE) (N/A) ENDOVEIN HARVEST OF GREATER SAPHENOUS VEIN (Right) APPLICATION OF CELL SAVER (N/A)  Subjective:  Remains intubated, sedated.  Patient with high chest tube output, significant drop in hemoglobin level.  Objective: Vital signs in last 24 hours: Temp:  [97.5 F (36.4 C)-98.2 F (36.8 C)] 97.5 F (36.4 C) (01/18 0712) Pulse Rate:  [55-104] 88 (01/18 0715) Cardiac Rhythm: A-V Sequential paced (01/18 0000) Resp:  [11-22] 14 (01/18 0715) BP: (78-155)/(42-98) 142/61 (01/18 0712) SpO2:  [88 %-100 %] 100 % (01/18 0715) Arterial Line BP: (64-211)/(38-78) 157/65 (01/18 0715) FiO2 (%):  [50 %-100 %] 100 % (01/18 0453)  Hemodynamic parameters for last 24 hours: CVP:  [6 mmHg-19 mmHg] 15 mmHg  Intake/Output from previous day: 01/17 0701 - 01/18 0700 In: 10623.4 [I.V.:6271.6; KVQQV:9563; NG/GT:120; IV Piggyback:787.7] Out: 8756 [EPPIR:5188; Emesis/NG output:300; Chest Tube:1950]  General appearance: alert, cooperative, and no distress Heart: regular rate and rhythm Lungs: diminished breath sounds bibasilar Abdomen: soft + distention Extremities: + edema Wound: aquacel on sternotomy, EVH leg unwrapped, ecchymosis present, not unexpected, no evidence of hematoma, incisions intact and clean  Lab Results: Recent Labs    11/05/21 0307 11/05/21 0526 11/05/21 0618  WBC 11.3*  --  7.9  HGB 9.7* 5.4* 5.5*  HCT 27.8* 16.0* 15.5*  PLT 92*  --  102*   BMET:  Recent Labs    11/04/21 0428 11/04/21 1323 11/04/21 1835 11/04/21 1840 11/05/21 0307 11/05/21 0526  NA  136   < > 137   < > 135 141  K 4.0   < > 4.2   < > 4.3 3.3*  CL 103   < > 99  --  105  --   CO2 24  --   --   --  15*  --   GLUCOSE 96   < > 130*  --  183*  --   BUN 11   < > 7*  --  10  --   CREATININE 0.93   < > 0.50*  --  1.26*  --   CALCIUM 9.0  --   --   --  6.6*  --    < > = values in this interval not displayed.    PT/INR:  Recent Labs    11/05/21 0618  LABPROT 22.7*  INR 2.0*   ABG    Component Value Date/Time   PHART 7.221 (L) 11/05/2021 0526   HCO3 12.0 (L) 11/05/2021 0526   TCO2 13 (L) 11/05/2021 0526   ACIDBASEDEF 15.0 (H) 11/05/2021 0526   O2SAT 91.0 11/05/2021 0526   CBG (last 3)  Recent Labs    11/05/21 0459 11/05/21 0615 11/05/21 0657  GLUCAP 170* 193* 180*    Assessment/Plan: S/P Procedure(s) (LRB): CORONARY ARTERY BYPASS GRAFTING (CABG) X 3, USING LEFT INTERNAL MAMMARY ARTERY AND ENDOSCOPICALLY HARVESTED RIGHT GREATER SAPHENOUS VEIN. LIMA TO LAD, SVG TO PDA, SVG TO OM (N/A) AORTIC VALVE REPLACEMENT (AVR) USING 23 MM INSPIRIS RESILIA  AORTIC VALVE (N/A) TRANSESOPHAGEAL ECHOCARDIOGRAM (TEE) (N/A) ENDOVEIN  HARVEST OF GREATER SAPHENOUS VEIN (Right) APPLICATION OF CELL SAVER (N/A)  CV- paced, on Epi, Levophed Pulm- remains intubated, CT output 1950 overnight Renal- creatinine slightly increased to 1.26, suspect related to volume loss, K is at 4.3 Expected post operative blood loss anemia, Hgb is at 5.5, INR is elevated at 2.0, remains coagulopathic... will require re-exploration of chest to assess for source of bleeding... EVH leg examined w/o evidence of hematoma, source of active bleeding Dispo- patient returning urgently to the OR for mediastinal exploration    LOS: 2 days   Ellwood Handler, PA-C 11/05/2021

## 2021-11-05 NOTE — Transfer of Care (Signed)
Immediate Anesthesia Transfer of Care Note  Patient: Joshua [name]  Procedure(s) Performed: EXPLORATION POST OPERATIVE OPEN HEARTFOR BLEEDING  Patient Location: ICU  Anesthesia Type:General  Level of Consciousness: Patient remains intubated per anesthesia plan  Airway & Oxygen Therapy: Patient remains intubated per anesthesia plan and Patient placed on Ventilator (see vital sign flow sheet for setting)  Post-op Assessment: Report given to RN and Post -op Vital signs reviewed and stable  Post vital signs: Reviewed and stable  Last Vitals:  Vitals Value Taken Time  BP 120/75   Temp    Pulse 88 11/05/21 1024  Resp 12 11/05/21 1024  SpO2 97 % 11/05/21 1024  Vitals shown include unvalidated device data.  Last Pain:  Vitals:   11/05/21 0727  TempSrc: Oral  PainSc:          Complications: No notable events documented.

## 2021-11-06 ENCOUNTER — Inpatient Hospital Stay (HOSPITAL_COMMUNITY): Payer: Medicare HMO

## 2021-11-06 ENCOUNTER — Encounter (HOSPITAL_COMMUNITY): Payer: Self-pay | Admitting: Thoracic Surgery (Cardiothoracic Vascular Surgery)

## 2021-11-06 ENCOUNTER — Encounter: Payer: Medicare HMO | Admitting: Surgery

## 2021-11-06 ENCOUNTER — Other Ambulatory Visit: Payer: Self-pay | Admitting: Cardiology

## 2021-11-06 DIAGNOSIS — Z951 Presence of aortocoronary bypass graft: Secondary | ICD-10-CM | POA: Diagnosis not present

## 2021-11-06 DIAGNOSIS — I350 Nonrheumatic aortic (valve) stenosis: Secondary | ICD-10-CM

## 2021-11-06 DIAGNOSIS — Z953 Presence of xenogenic heart valve: Secondary | ICD-10-CM | POA: Diagnosis not present

## 2021-11-06 LAB — POCT I-STAT 7, (LYTES, BLD GAS, ICA,H+H)
Acid-Base Excess: 1 mmol/L (ref 0.0–2.0)
Acid-Base Excess: 0 mmol/L (ref 0.0–2.0)
Acid-base deficit: 7 mmol/L — ABNORMAL HIGH (ref 0.0–2.0)
Bicarbonate: 20.2 mmol/L (ref 20.0–28.0)
Bicarbonate: 24.4 mmol/L (ref 20.0–28.0)
Bicarbonate: 23.5 mmol/L (ref 20.0–28.0)
Calcium, Ion: 0.89 mmol/L — CL (ref 1.15–1.40)
Calcium, Ion: 1.03 mmol/L — ABNORMAL LOW (ref 1.15–1.40)
Calcium, Ion: 1.07 mmol/L — ABNORMAL LOW (ref 1.15–1.40)
HCT: 24 % — ABNORMAL LOW (ref 39.0–52.0)
HCT: 26 % — ABNORMAL LOW (ref 39.0–52.0)
HCT: 24 % — ABNORMAL LOW (ref 39.0–52.0)
Hemoglobin: 8.2 g/dL — ABNORMAL LOW (ref 13.0–17.0)
Hemoglobin: 8.8 g/dL — ABNORMAL LOW (ref 13.0–17.0)
Hemoglobin: 8.2 g/dL — ABNORMAL LOW (ref 13.0–17.0)
O2 Saturation: 98 %
O2 Saturation: 96 %
O2 Saturation: 94 %
Patient temperature: 37.5
Potassium: 3.5 mmol/L (ref 3.5–5.1)
Potassium: 3.8 mmol/L (ref 3.5–5.1)
Potassium: 3.6 mmol/L (ref 3.5–5.1)
Sodium: 143 mmol/L (ref 135–145)
Sodium: 142 mmol/L (ref 135–145)
Sodium: 141 mmol/L (ref 135–145)
TCO2: 22 mmol/L (ref 22–32)
TCO2: 25 mmol/L (ref 22–32)
TCO2: 24 mmol/L (ref 22–32)
pCO2 arterial: 47.4 mmHg (ref 32.0–48.0)
pCO2 arterial: 33.4 mmHg (ref 32.0–48.0)
pCO2 arterial: 30.8 mmHg — ABNORMAL LOW (ref 32.0–48.0)
pH, Arterial: 7.238 — ABNORMAL LOW (ref 7.350–7.450)
pH, Arterial: 7.474 — ABNORMAL HIGH (ref 7.350–7.450)
pH, Arterial: 7.49 — ABNORMAL HIGH (ref 7.350–7.450)
pO2, Arterial: 132 mmHg — ABNORMAL HIGH (ref 83.0–108.0)
pO2, Arterial: 75 mmHg — ABNORMAL LOW (ref 83.0–108.0)
pO2, Arterial: 64 mmHg — ABNORMAL LOW (ref 83.0–108.0)

## 2021-11-06 LAB — PREPARE PLATELET PHERESIS
Unit division: 0
Unit division: 0
Unit division: 0
Unit division: 0
Unit division: 0

## 2021-11-06 LAB — PREPARE FRESH FROZEN PLASMA
Unit division: 0
Unit division: 0
Unit division: 0
Unit division: 0
Unit division: 0
Unit division: 0

## 2021-11-06 LAB — BPAM FFP
Blood Product Expiration Date: 202301232359
Blood Product Expiration Date: 202301232359
Blood Product Expiration Date: 202301232359
Blood Product Expiration Date: 202301232359
Blood Product Expiration Date: 202301232359
Blood Product Expiration Date: 202301232359
Blood Product Expiration Date: 202301232359
ISSUE DATE / TIME: 202301180515
ISSUE DATE / TIME: 202301180515
ISSUE DATE / TIME: 202301180515
ISSUE DATE / TIME: 202301180515
ISSUE DATE / TIME: 202301180732
ISSUE DATE / TIME: 202301180732
ISSUE DATE / TIME: 202301180850
Unit Type and Rh: 1700
Unit Type and Rh: 7300
Unit Type and Rh: 7300
Unit Type and Rh: 7300
Unit Type and Rh: 7300
Unit Type and Rh: 7300
Unit Type and Rh: 7300

## 2021-11-06 LAB — BPAM PLATELET PHERESIS
Blood Product Expiration Date: 202301192359
Blood Product Expiration Date: 202301192359
Blood Product Expiration Date: 202301202359
Blood Product Expiration Date: 202301202359
Blood Product Expiration Date: 202301212359
ISSUE DATE / TIME: 202301180443
ISSUE DATE / TIME: 202301180902
ISSUE DATE / TIME: 202301180902
ISSUE DATE / TIME: 202301181646
Unit Type and Rh: 8400
Unit Type and Rh: 6200
Unit Type and Rh: 7300
Unit Type and Rh: 5100
Unit Type and Rh: 6200

## 2021-11-06 LAB — CBC
HCT: 26.1 % — ABNORMAL LOW (ref 39.0–52.0)
Hemoglobin: 9.2 g/dL — ABNORMAL LOW (ref 13.0–17.0)
MCH: 29 pg (ref 26.0–34.0)
MCHC: 35.2 g/dL (ref 30.0–36.0)
MCV: 82.3 fL (ref 80.0–100.0)
Platelets: 113 10*3/uL — ABNORMAL LOW (ref 150–400)
RBC: 3.17 MIL/uL — ABNORMAL LOW (ref 4.22–5.81)
RDW: 19.2 % — ABNORMAL HIGH (ref 11.5–15.5)
WBC: 7.8 10*3/uL (ref 4.0–10.5)
nRBC: 0.8 % — ABNORMAL HIGH (ref 0.0–0.2)

## 2021-11-06 LAB — BPAM CRYOPRECIPITATE
Blood Product Expiration Date: 202301181458
ISSUE DATE / TIME: 202301180922
Unit Type and Rh: 5100

## 2021-11-06 LAB — MAGNESIUM: Magnesium: 2.2 mg/dL (ref 1.7–2.4)

## 2021-11-06 LAB — PREPARE CRYOPRECIPITATE: Unit division: 0

## 2021-11-06 LAB — BASIC METABOLIC PANEL
Anion gap: 8 (ref 5–15)
BUN: 12 mg/dL (ref 8–23)
CO2: 26 mmol/L (ref 22–32)
Calcium: 7.1 mg/dL — ABNORMAL LOW (ref 8.9–10.3)
Chloride: 107 mmol/L (ref 98–111)
Creatinine, Ser: 1.38 mg/dL — ABNORMAL HIGH (ref 0.61–1.24)
GFR, Estimated: 51 mL/min — ABNORMAL LOW (ref >60)
Glucose, Bld: 136 mg/dL — ABNORMAL HIGH (ref 70–99)
Potassium: 3.7 mmol/L (ref 3.5–5.1)
Sodium: 141 mmol/L (ref 135–145)

## 2021-11-06 LAB — GLUCOSE, CAPILLARY
Glucose-Capillary: 104 mg/dL — ABNORMAL HIGH (ref 70–99)
Glucose-Capillary: 111 mg/dL — ABNORMAL HIGH (ref 70–99)
Glucose-Capillary: 129 mg/dL — ABNORMAL HIGH (ref 70–99)
Glucose-Capillary: 132 mg/dL — ABNORMAL HIGH (ref 70–99)
Glucose-Capillary: 159 mg/dL — ABNORMAL HIGH (ref 70–99)

## 2021-11-06 LAB — SURGICAL PATHOLOGY

## 2021-11-06 MED ORDER — POTASSIUM CHLORIDE 10 MEQ/50ML IV SOLN
10.0000 meq | INTRAVENOUS | Status: AC
  Administered 2021-11-06 (×3): 10 meq via INTRAVENOUS
  Filled 2021-11-06 (×2): qty 50

## 2021-11-06 MED ORDER — FUROSEMIDE 10 MG/ML IJ SOLN
40.0000 mg | Freq: Once | INTRAMUSCULAR | Status: AC
  Administered 2021-11-06: 40 mg via INTRAVENOUS
  Filled 2021-11-06: qty 4

## 2021-11-06 MED ORDER — ORAL CARE MOUTH RINSE
15.0000 mL | Freq: Two times a day (BID) | OROMUCOSAL | Status: DC
  Administered 2021-11-06 – 2021-11-11 (×11): 15 mL via OROMUCOSAL

## 2021-11-06 MED FILL — Sodium Bicarbonate IV Soln 8.4%: INTRAVENOUS | Qty: 50 | Status: AC

## 2021-11-06 MED FILL — Electrolyte-R (PH 7.4) Solution: INTRAVENOUS | Qty: 4000 | Status: AC

## 2021-11-06 MED FILL — Lidocaine HCl Local Preservative Free (PF) Inj 2%: INTRAMUSCULAR | Qty: 15 | Status: AC

## 2021-11-06 MED FILL — Heparin Sodium (Porcine) Inj 1000 Unit/ML: INTRAMUSCULAR | Qty: 30 | Status: AC

## 2021-11-06 MED FILL — Magnesium Sulfate Inj 50%: INTRAMUSCULAR | Qty: 10 | Status: AC

## 2021-11-06 MED FILL — Sodium Chloride IV Soln 0.9%: INTRAVENOUS | Qty: 2000 | Status: AC

## 2021-11-06 MED FILL — Albumin, Human Inj 5%: INTRAVENOUS | Qty: 250 | Status: AC

## 2021-11-06 MED FILL — Electrolyte-R (PH 7.4) Solution: INTRAVENOUS | Qty: 9000 | Status: AC

## 2021-11-06 MED FILL — Mannitol IV Soln 20%: INTRAVENOUS | Qty: 500 | Status: AC

## 2021-11-06 MED FILL — Sodium Chloride IV Soln 0.9%: INTRAVENOUS | Qty: 3000 | Status: AC

## 2021-11-06 MED FILL — Potassium Chloride Inj 2 mEq/ML: INTRAVENOUS | Qty: 40 | Status: AC

## 2021-11-06 MED FILL — Heparin Sodium (Porcine) Inj 1000 Unit/ML: Qty: 1000 | Status: AC

## 2021-11-06 MED FILL — Calcium Chloride Inj 10%: INTRAVENOUS | Qty: 10 | Status: AC

## 2021-11-06 MED FILL — Heparin Sodium (Porcine) Inj 1000 Unit/ML: INTRAMUSCULAR | Qty: 10 | Status: AC

## 2021-11-06 NOTE — Progress Notes (Signed)
NAME:  Joshua [name], MRN:  [id number], DOB:  [birth year], LOS: 3 ADMISSION DATE:  11/03/2021, CONSULTATION DATE:  11/05/2021 REFERRING MD:  Kipp Brood , CHIEF COMPLAINT:  post operative ventilator management.   History of Present Illness:  86 year old man who underwent AVR CABG  for severe aortic stenosis who presented with at syncopal episode.   AVA estimated at 0.79cm2 and he was also found to have 3 V CAD with LM involvement.   Pertinent  Medical History   Past Medical History:  Diagnosis Date   Arthritis    Cataract    Esophageal stricture    GERD (gastroesophageal reflux disease)    Hyperlipidemia    Hypertension    Liver disease    hepatitis   Severe aortic stenosis    Significant Hospital Events: Including procedures, antibiotic start and stop dates in addition to other pertinent events   1/17 - Aortic valve replacement with a 23 mm Inspiris valve and Coronary artery bypass grafting x3.  With LIMA to LAD, reverse saphenous vein to PDA, and obtuse marginal. 1/18 - returned to OR for bleeding - no clear source found. Received 3 units of PRBC, 3 FFP, 2 PLT , cryoprecipate.  1/19 - extubated on CVTS protocol.   Interim History / Subjective:   Denies chest pain.   Objective   Blood pressure 137/60, pulse 88, temperature 99.7 F (37.6 C), resp. rate 16, height 5' 11.5" (1.816 m), weight 103.8 kg, SpO2 94 %. CVP:  [6 mmHg-29 mmHg] 13 mmHg  Vent Mode: PRVC;SIMV;PSV FiO2 (%):  [40 %-100 %] 40 % Set Rate:  [4 bmp-12 bmp] 4 bmp Vt Set:  [610 mL] 610 mL PEEP:  [5 cmH20-8 cmH20] 5 cmH20 Pressure Support:  [10 cmH20] 10 cmH20 Plateau Pressure:  [12 cmH20-22 cmH20] 22 cmH20   Intake/Output Summary (Last 24 hours) at 11/06/2021 0751 Last data filed at 11/06/2021 0700 Gross per 24 hour  Intake 5642.9 ml  Output 4375 ml  Net 1267.9 ml    Filed Weights   11/03/21 0851 11/04/21 0400 11/06/21 0600  Weight: 83.9 kg 82.3 kg 103.8 kg    Examination: General: extubated and  comfortable  HENT: mild hoarseness  Lungs: clear bilaterally. Pulling 500+ on IS without splinting.  Cardiovascular: HS distant + rub . Minimal blood staining on dressing. Mediastinal and left pleural tubes in place with thin sanguinous discharge. V-paced at 49 with underlying rhythm in the 30's Abdomen: soft  Extremities: warm. ++ generalized edemal Neuro: awake with non focal examination.  GU: good urine output.  Ancillary tests personally reviewed:   Creatinine has increased to 1.38  HB 9.2 PLT 113 CXR: bilateral atelectasis and right pleural effusion.  Assessment & Plan:  Post operative atelectasis  Hemorrhagic shock  Acute blood loss anemia Anasarca S/P AVR CABG CAD AKI  Plan:   -Progressive ambulation.  - Multi-modal analgesia, pain control appears adequate at present - Will allow for auto-diuresis and stabilization of renal function today. May need diuretics tomorrow  - Wean off norepinephrine today, keep MAP > 65 - Continue pacing, allow for AV nodal edema to settle.   Best Practice (right click and "Reselect all SmartList Selections" daily)   Diet/type: clear liquids - advance diet per protocol  DVT prophylaxis: not indicated GI prophylaxis: H2B Lines: Central line and Arterial Line Foley:  Yes, and it is still needed Code Status:  full code Last date of multidisciplinary goals of care discussion [per primary team]  CRITICAL CARE Performed by:  Kery Batzel  Total critical care time: 35 minutes  Critical care time was exclusive of separately billable procedures and treating other patients.  Critical care was necessary to treat or prevent imminent or life-threatening deterioration.  Critical care was time spent personally by me on the following activities: development of treatment plan with patient and/or surrogate as well as nursing, discussions with consultants, evaluation of patient's response to treatment, examination of patient, obtaining history from  patient or surrogate, ordering and performing treatments and interventions, ordering and review of laboratory studies, ordering and review of radiographic studies, pulse oximetry, re-evaluation of patient's condition and participation in multidisciplinary rounds.  Kipp Brood, MD [facility] ICU Physician [place]  Pager: [phone] Mobile: [phone] After hours: [phone].

## 2021-11-06 NOTE — Procedures (Signed)
Extubation Procedure Note  Patient Details:   Name: Joshua [name] DOB: [birth year] MRN: [id number]   Airway Documentation:    Vent end date: 11/06/21 Vent end time: 0738   Evaluation  O2 sats: stable throughout Complications: No apparent complications Patient did tolerate procedure well. Bilateral Breath Sounds: Clear, Diminished   Yes  Patient was extubated to a 4L [place] without any complications, dyspnea or stridor noted. NIF: greater than -40, VC: 1.5L, positive cuff leak prior to extubation.   Claretta Fraise 11/06/2021, 7:38 AM

## 2021-11-06 NOTE — Progress Notes (Addendum)
[address]       [place] [address]             [phone]      1 Day Post-Op Procedure(s) (LRB): EXPLORATION POST OPERATIVE OPEN HEARTFOR BLEEDING (N/A)  Subjective:  Awake and alert on ventilator.  He denies pain.    Objective: Vital signs in last 24 hours: Temp:  [94 F (34.4 C)-99.3 F (37.4 C)] 99.3 F (37.4 C) (01/19 0600) Pulse Rate:  [88-93] 88 (01/19 0600) Cardiac Rhythm: A-V Sequential paced (01/19 0000) Resp:  [12-20] 15 (01/19 0600) BP: (91-166)/(50-85) 102/65 (01/19 0600) SpO2:  [92 %-100 %] 94 % (01/19 0650) Arterial Line BP: (92-180)/(41-77) 94/41 (01/19 0600) FiO2 (%):  [40 %-100 %] 40 % (01/19 0623) Weight:  [103.8 kg] 103.8 kg (01/19 0600)  Hemodynamic parameters for last 24 hours: CVP:  [6 mmHg-29 mmHg] 13 mmHg  Intake/Output from previous day: 01/18 0701 - 01/19 0700 In: 6121.1 [I.V.:1559.5; Blood:3708.5; IV Piggyback:853.1] Out: 4375 [Urine:2365; Blood:800; Chest Tube:1210]  General appearance: alert, cooperative, and no distress Heart: regular rate and rhythm Lungs: clear to auscultation bilaterally Abdomen: soft, non-tender; bowel sounds normal; no masses,  no organomegaly Extremities: edema significant, Bilateral Upper and Lower Extremity Wound: aquacel in place, EVH leg with ecchymosis, no evidence of infection  Lab Results: Recent Labs    11/05/21 1649 11/06/21 0312  WBC 7.1 7.8  HGB 9.7* 9.2*  HCT 27.1* 26.1*  PLT 100* 113*   BMET:  Recent Labs    11/05/21 1649 11/06/21 0312  NA 140 141  K 3.9 3.7  CL 103 107  CO2 24 26  GLUCOSE 134* 136*  BUN 11 12  CREATININE 1.30* 1.38*  CALCIUM 7.3* 7.1*    PT/INR:  Recent Labs    11/05/21 1649  LABPROT 16.4*  INR 1.3*   ABG    Component Value Date/Time   PHART 7.445 11/05/2021 1511   HCO3 24.0 11/05/2021 1511   TCO2 25 11/05/2021 1511   ACIDBASEDEF 6.0 (H) 11/05/2021 1032   O2SAT 94.0 11/05/2021 1511   CBG (last 3)  Recent Labs     11/05/21 2204 11/05/21 2255 11/05/21 2334  GLUCAP 91 95 112*    Assessment/Plan: S/P Procedure(s) (LRB): EXPLORATION POST OPERATIVE OPEN HEARTFOR BLEEDING (N/A)  CV- currently paced, Sinus Bradycardia with 1st degree AV Block, no BB at this time, remains on Levophed at 1 weaning as parameters are met Pulm- currently on vent, alert and responsive... ABG results look good this morning, can hopefully extubate today... CT output 1210 total, leave in place today Renal- creatinine at 1.38 this morning, significant volume overload on exam, weight is up if accurate 40 lbs... needs diuresis will discuss with staff... K is at 3.7, replacement per ICU protocol Expected post operative bleeding, re-explored in operating room yesterday... Hgb is stable at 9.2....Marland Kitchen multiple products received in past 24-48 hours CBGs- patient not a diabetic, continue SSIP for now, can hopefully stop soon Dispo- patient doing better today, now only requiring Levophed at 1, can hopefully wean off today, wean to extubate as able, significant volume overloaded... needs diuretics will discuss with staff, hgb stable at 9.2.Marland KitchenMarland Kitchen coagulopathy seems resolved, CT output high yesterday, can hopefully remove chest tubes tomorrow if output is low, supplement K per ICU protocol   LOS: 3 days   Ellwood Handler, PA-C 11/06/2021  Patient seen and examined. Just extubated When I paused pacemaker he was in CHB with slow escape in 30s-  continue DDD pacing Will leave chest tubes in place today Creatinine up. Fluid overloaded from blood products Will diurese. Monitor creatinine  Remo Lipps C. Roxan Hockey, MD [facility] [phone]

## 2021-11-06 NOTE — Addendum Note (Signed)
Addendum  created 11/06/21 0749 by Josephine Igo, CRNA   Order list changed, Pharmacy for encounter modified

## 2021-11-06 NOTE — Discharge Summary (Signed)
[address]       [place] [address]             [phone]    Physician Discharge Summary  Patient ID: Joshua [name] MRN: [id number] DOB/AGE: 1935-12-14 86 y.o.  Admit date: 11/03/2021 Discharge date: 11/21/2021  Admission Diagnoses:  Patient Active Problem List   Diagnosis Date Noted   Severe aortic stenosis    Infected abrasion of fifth toe, right, initial encounter 10/31/2019   Pain due to onychomycosis of toenails of both feet 04/25/2019   Porokeratosis 04/25/2019   Gastroesophageal reflux disease 04/22/2018   Moderate calcific aortic stenosis 08/14/2016   PVCs (premature ventricular contractions) 08/14/2016   Hypercholesterolemia 08/14/2016   DJD (degenerative joint disease) 08/14/2016   Essential hypertension 07/16/2016   Discharge Diagnoses:  Patient Active Problem List   Diagnosis Date Noted   CHB (complete heart block) ([place]) 11/14/2021   S/P placement of cardiac pacemaker 11/14/2021   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   S/P aortic valve replacement with bioprosthetic valve 11/04/2021   Severe aortic stenosis    Infected abrasion of fifth toe, right, initial encounter 10/31/2019   Pain due to onychomycosis of toenails of both feet 04/25/2019   Porokeratosis 04/25/2019   Gastroesophageal reflux disease 04/22/2018   Moderate calcific aortic stenosis 08/14/2016   PVCs (premature ventricular contractions) 08/14/2016   Hypercholesterolemia 08/14/2016   DJD (degenerative joint disease) 08/14/2016   Essential hypertension 07/16/2016   Discharged Condition: stable  History of Present Illness:  Joshua [name] is an 86 yo male with history of PVC's, arthritis, GERD, HTN, HLD, Aortic Stenosis.  The patient was referred to Dr. Audie Box by his PCP after he suffered a syncopal episode.  The patient stated he was walking in his neighborhood when he felt light headed and ultimately passed out.  He denied seizure like activity and did not experience any loss  of bladder/stool function.  His Aortic Stenosis was originally diagnosed in 2017, has not been followed up on since that time as he did not know this was required.  The patient also experiences lower extremity edema which has been present for the past several months.  Physical exam by Dr. Jenetta DownerNori Riis revealed a very loud murmur with an absent S2 and he was concerned he had progression to severe aortic stenosis.  Repeat Echocardiogram was obtained and showed a preserved EF of 60-65% with severe aortic stenosis with Mean gradient 58 mmHg, peak gradient 95.6 mmHg, AVA 0.79 cm2, DI 0.20.  His valve leaflets are also calcified and thickened with restricted leaflet motion.  He was referred to Dr. Angelena Form for possible TAVR procedure.  It was felt with recent syncope the patient should have expedited TAVR procedure.  He would require cardiac catheterization prior to proceeding.  This was performed today 1/16 and showed 3v CAD.  This was felt the patient would require coronary bypass grafting procedure and conventional Aortic Valve replacement and Cardiac surgery consultation was requested.  The patient is currently symptom free.  He is disappointed he is unable to have a TAVR procedure.  Prior to surgery he was not that active due to personal choice.  He was able to ambulate to the mailbox without difficulty, however his son states he previous played golf but had to stop due to leg weakness.  He is a former smoking and quit more than 50 years ago.  He routinely has dentist visits every 6 months.  He is an occasional drinker.  He is agreeable to proceed with surgical intervention tomorrow with Dr. Kipp Brood.   Hospital Course:  The patient was taken to the operating room on 11/04/2020.  He underwent CABG x 3 utilizing LIMA to LAD, SVG to OM, and SVG to PDA.  He also underwent Aortic Valve Replacement with a 23 mm Edwards Resilia Bioprosthetic valve.  He also underwent endoscopic harvest of greater saphenous vein from his  right leg.  He tolerated the procedure without difficulty and was taken to the SICU in stable condition.  The patient had increased pressor requirement overnight.  He had high chest tube output.  He was treated with 2 units of packed red cells.  Follow up labs showed evidence of coagulopathy and he was treated with 4 FFP and 1 platelet.  He was acidotic and bicard was administered.  The patient's bleeding slowed some after administration of products, however output was still > 100 cc/hr.  His pacemaker leads were also only capturing intermittently.  It was felt he would require re-exploration in the operating room.  During the procedure he remained coagulopathic.  TEG was obtained and the patient was transfused additional 3 units of packed cells, 3 units of FFP, 2 platelets, and a unit of FFP per the result. Post procedure he was transported back to the SICU.  Hgb level was improved to 9.2 he was able to be weaned off Epinephrine and Levophed requirements decreased.  The patient was weaned and extubated on POD #2.  He was weaned off all pressor support.  His hemoglobin remained stable.  His arterial line was removed without difficulty.  He was started on IV lasix for hypervolemic state.  He continued to require V. Pacing with episodes of loss of capture.  EP consult was obtained and they felt permanent pacemaker would be indicated.  He underwent placement of a temporary transvenous pacer on 11/07/2021.  This was replaced with a permanent pacemaker on 11/09/2021.  The patient epicardial pacing wires were removed without difficulty.  His chest tube output decreased and his tubes were removed on 11/08/2021.  He did develop an acute renal injury but creatinine has shown steady improvement and is now in the normal range.  He has an expected acute blood loss anemia which is stabilized over time.  He has a postoperative thrombocytopenia.  Rehabilitation modalities have been slow due to pacer dependency.  He was hypertensive  and started on Lisinopril.  CXR on 1/23 showed bilateral pneumothoraces.  He underwent placement of a right and left side pigtail catheter.  Follow up CXR showed some re-expansion of the lung.  He was started on Lopressor for additional BP relief.  His pacing wires were removed without difficulty.  It was felt patient would benefit from inpatient rehab and CIR consult was placed. His right pneumothorax resolved.  His right sided pigtail catheter was free from air leak and removed on 11/12/2021. Follow up CXR showed no evidence of pneumothorax. He was transitioned to an oral regimen of Lasix, potassium.  His hemoglobin level remained stable. The patient's left chest tube remained free from air leak and was removed on 11/13/2021.  Follow up CXR showed minimal apical pneumothorax on the left.  He was stable for transfer to the progressive care unit on 11/13/2021.  The patient continues to do well.  He is in a paced rhythm and his PPM is functioning appropriately.  His blood pressure was low and his Lisinopril was decreased to 10 mg daily.  He is deconditioned and is  working with PT/OT.  They continue to recommend CIR placement.  Unfortunately, [employer] has declined CIR placement.  Due to this SNF placement will be arranged.  The patient is agreeable to this.  His surgical incisions are healing without evidence of infection.  He is felt medically stable for discharge to SNF today.  We ask the SNF to please do the following: 1. Please obtain vital signs at least one time daily 2.Please weigh the patient daily. If he or she continues to gain weight or develops lower extremity edema, contact the office at [phone]. 3. Ambulate patient at least three times daily and please use sternal precautions.   Consults: cardiology,EP  Significant Diagnostic Studies: angiography:     Mid RCA lesion is 99% stenosed.   Prox RCA lesion is 60% stenosed.   Mid LM lesion is 40% stenosed.   Ost LAD to Prox LAD lesion is 70%  stenosed.   Prox LAD to Mid LAD lesion is 40% stenosed.   Prox Cx to Mid Cx lesion is 40% stenosed.   Mild to moderate distal left main stenosis.  Severe, heavily calcified ostial LAD stenosis (RFR 0.78 suggesting the lesion is flow limiting).  Mild mid Circumflex stenosis Large dominant RCA with eccentric, nodular calcific lesion in the proximal vessel. This is a moderate stenosis. The mid RCA has a severe heavily calcified stenosis.  Normal right heart pressures Severe aortic stenosis by echo (aortic valve not crossed today)   Recommendations: He has severe, heavily calcified CAD involving the ostial LAD and the mid RCA. PCI of the LAD lesion would be high risk. I have reviewed his films with our interventional and structural team and I think the best option is to consider CABG and AVR. Given recent syncope, will admit him to the telemetry unit and have CT surgery see him to review options for CABG with surgical AVR. He appears to be a good candidate for surgery.   Treatments: surgery:    11/04/2021 Patient:  Joshua [name] Pre-Op Dx: Severe aortic stenosis                         Left main/three-vessel coronary artery disease Post-op Dx: Same Procedure: Aortic valve replacement with a 23 mm Inspiris valve Coronary artery bypass grafting x3.  With LIMA to LAD, reverse saphenous vein to PDA, and obtuse marginal.     Surgeon and Role:      * Lightfoot, Lucile Crater, MD - Primary    *E. Barrett, PA-C and D. Tacy Dura, PA-C-  An experienced assistant was required given the complexity of this surgery and the standard of surgical care. The assistant was needed for exposure, dissection, suctioning, retraction of delicate tissues and sutures, instrument exchange and for overall help during this procedure.      Operative Report    DATE OF PROCEDURE: 11/05/2021   PREOPERATIVE DIAGNOSIS:  Bleeding after open heart surgery.   POSTOPERATIVE DIAGNOSIS:  Bleeding after open heart surgery.    PROCEDURE:  Reexploration of mediastinum for bleeding.   SURGEON: Modesto Charon, MD   ASSISTANT: Quincy Simmonds, she is a PA.  Discharge Exam: Blood pressure 125/82, pulse 84, temperature 98.8 F (37.1 C), temperature source Oral, resp. rate 17, height 5' 11.5" (1.816 m), weight 72.5 kg, SpO2 90 %.  Cardiovascular: V paced Pulmonary: Clear to auscultation bilaterally Abdomen: Soft, non tender, bowel sounds present. Extremities: No  lower extremity edema. Wounds: Clean and dry.  No erythema or signs  of infection.     Discharge Medications:  The patient has been discharged on:   1.Beta Blocker:  Yes [ X  ]                              No   [  ]                              If No, reason:   2.Ace Inhibitor/ARB: Yes [ X  ]                                     No  [    ]                                     If No, reason:  3.Statin:   Yes [ X  ]                  No  [   ]                  If No, reason:  4.Ecasa:  Yes  [  X ]                  No   [   ]                  If No, reason:  Patient had ACS upon admission:  Plavix/P2Y12 inhibitor: Yes [   ]                                      No  [  X ]     Discharge Instructions     Amb Referral to Cardiac Rehabilitation   Complete by: As directed    Diagnosis:  Valve Replacement CABG     Valve: Aortic   CABG X ___: 3   After initial evaluation and assessments completed: Virtual Based Care may be provided alone or in conjunction with Phase 2 Cardiac Rehab based on patient barriers.: Yes      Allergies as of 11/21/2021   No Known Allergies      Medication List     STOP taking these medications    naproxen sodium 220 MG tablet Commonly known as: ALEVE       TAKE these medications    acetaminophen 325 MG tablet Commonly known as: TYLENOL Take 1-2 tablets (325-650 mg total) by mouth every 4 (four) hours as needed for mild pain.   aspirin EC 81 MG tablet Take 1 tablet (81 mg total) by mouth  daily. Swallow whole.   atorvastatin 40 MG tablet Commonly known as: LIPITOR Take 1 tablet (40 mg total) by mouth daily. What changed:  medication strength See the new instructions.   GLUCOSAMINE 1500 COMPLEX PO Take 2 tablets by mouth every other day.   ketoconazole 2 % shampoo Commonly known as: NIZORAL Apply 1 application topically once a week.   lisinopril 10 MG tablet Commonly known as: ZESTRIL Take 1 tablet (10 mg total) by mouth daily. What changed:  medication strength how much to take  metoprolol tartrate 25 MG tablet Commonly known as: LOPRESSOR Take 1 tablet (25 mg total) by mouth 2 (two) times daily. What changed:  medication strength how much to take how to take this when to take this additional instructions   multivitamin tablet Take 1 tablet by mouth daily.   omeprazole 40 MG capsule Commonly known as: PRILOSEC TAKE 1 CAPSULE EVERY DAY   traMADol 50 MG tablet Commonly known as: ULTRAM Take 1 tablet (50 mg total) by mouth every 6 (six) hours as needed for moderate pain.   vitamin B-12 1000 MCG tablet Commonly known as: CYANOCOBALAMIN Take 1,000 mcg by mouth daily.        Follow-up Information     [facility] Follow up.   Why: on 2/2 at 920 for post hospital follow/pacemaker check Contact information: [place] [id number] [phone]        Deberah Pelton, NP Follow up on 11/27/2021.   Specialty: Cardiology Why: Appointment is at 8:25 Contact information: [place] [address] [phone]                 Signed: Lars Pinks PA-C 11/21/2021, 8:53 AM

## 2021-11-06 NOTE — Progress Notes (Signed)
CT surgery p.m. Rounds  Patient had a stable day up to chair He is in a paced rhythm Good Lasix diuresis INO -600 cc Patient breathing comfortably Blood pressure 134/65, pulse 91, temperature 98.5 F (36.9 C), temperature source Oral, resp. rate (!) 21, height 5' 11.5" (1.816 m), weight 103.8 kg, SpO2 93 %.

## 2021-11-06 NOTE — Plan of Care (Signed)
°  Problem: Respiratory: Goal: Respiratory status will improve Outcome: Progressing   Problem: Cardiac: Goal: Will achieve and/or maintain hemodynamic stability Outcome: Progressing   Problem: Urinary Elimination: Goal: Ability to achieve and maintain adequate renal perfusion and functioning will improve Outcome: Progressing

## 2021-11-06 NOTE — H&P (View-Only) (Signed)
Progress Note  Patient Name: Joshua [name] Date of Encounter: 11/06/2021  [facility] Cardiologist: None   Subjective   Extubated, alert and awake. States he feels overall good this morning. No chest pain. Happy to have the breathing tube out.  Inpatient Medications    Scheduled Meds:  sodium chloride   Intravenous Once   sodium chloride   Intravenous Once   sodium chloride   Intravenous Once   sodium chloride   Intravenous Once   sodium chloride   Intravenous Once   sodium chloride   Intravenous Once   acetaminophen  1,000 mg Oral Q6H   Or   acetaminophen (TYLENOL) oral liquid 160 mg/5 mL  1,000 mg Per Tube Q6H   aspirin EC  325 mg Oral Daily   Or   aspirin  324 mg Per Tube Daily   atorvastatin  40 mg Per Tube Daily   bisacodyl  10 mg Oral Daily   Or   bisacodyl  10 mg Rectal Daily   chlorhexidine gluconate (MEDLINE KIT)  15 mL Mouth Rinse BID   Chlorhexidine Gluconate Cloth  6 each Topical Daily   docusate sodium  200 mg Oral Daily   insulin aspart  0-24 Units Subcutaneous Q4H   mouth rinse  15 mL Mouth Rinse 10 times per day   metoprolol tartrate  12.5 mg Oral BID   Or   metoprolol tartrate  12.5 mg Per Tube BID   pantoprazole  40 mg Oral Daily   sodium chloride flush  3 mL Intravenous Q12H   Continuous Infusions:  sodium chloride 10 mL/hr at 11/06/21 0600   sodium chloride     sodium chloride     sodium chloride      ceFAZolin (ANCEF) IV Stopped (11/06/21 0604)   dexmedetomidine (PRECEDEX) IV infusion 0.3 mcg/kg/hr (11/06/21 0500)   epinephrine Stopped (11/05/21 1032)   lactated ringers Stopped (11/05/21 1640)   lactated ringers Stopped (11/06/21 0752)   milrinone     nitroGLYCERIN 0 mcg/min (11/04/21 1941)   norepinephrine (LEVOPHED) Adult infusion 2 mcg/min (11/06/21 0800)   phenylephrine (NEO-SYNEPHRINE) Adult infusion Stopped (11/05/21 0532)   potassium chloride 50 mL/hr at 11/06/21 0800   PRN Meds: sodium chloride, metoprolol tartrate,  midazolam, morphine injection, ondansetron (ZOFRAN) IV, oxyCODONE, sodium chloride flush, traMADol   Vital Signs    Vitals:   11/06/21 0650 11/06/21 0700 11/06/21 0738 11/06/21 0800  BP:  114/79 137/60 118/74  Pulse:  88  88  Resp:  16  (!) 23  Temp:  99.7 F (37.6 C)    TempSrc:      SpO2: 94% 94% 94% 94%  Weight:      Height:        Intake/Output Summary (Last 24 hours) at 11/06/2021 0827 Last data filed at 11/06/2021 0800 Gross per 24 hour  Intake 4906.07 ml  Output 4555 ml  Net 351.07 ml   Last 3 Weights 11/06/2021 11/04/2021 11/03/2021  Weight (lbs) 228 lb 13.4 oz 181 lb 8 oz 185 lb  Weight (kg) 103.8 kg 82.328 kg 83.915 kg      Telemetry    A-V paced - Personally Reviewed  ECG    No new tracing - Personally Reviewed  Physical Exam   GEN: Sitting up in bed, alert and interactive  Neck: CVL in place Cardiac: RR, no murmurs Respiratory: Diminished at the bases GI: Soft, nontender, non-distended  MS: Anasarcic Neuro:  Nonfocal  Psych: Normal affect   Labs  High Sensitivity Troponin:  No results for input(s): TROPONINIHS in the last 720 hours.   Chemistry Recent Labs  Lab 11/03/21 2003 11/04/21 0428 11/05/21 1047 11/05/21 1511 11/05/21 1649 11/06/21 0312  NA 134*   < > 144 142 140 141  K 4.0   < > 3.7 4.0 3.9 3.7  CL 101   < > 103  --  103 107  CO2 21*   < > 22  --  24 26  GLUCOSE 149*   < > 177*  --  134* 136*  BUN 12   < > 10  --  11 12  CREATININE 0.95   < > 1.25*  --  1.30* 1.38*  CALCIUM 8.9   < > 7.4*  --  7.3* 7.1*  MG  --    < > 2.4  --  2.1 2.2  PROT 6.3*  --   --   --   --   --   ALBUMIN 3.5  --   --   --   --   --   AST 36  --   --   --   --   --   ALT 24  --   --   --   --   --   ALKPHOS 55  --   --   --   --   --   BILITOT 1.7*  --   --   --   --   --   GFRNONAA >60   < > 57*  --  55* 51*  ANIONGAP 12   < > 19*  --  13 8   < > = values in this interval not displayed.    Lipids No results for input(s): CHOL, TRIG, HDL, LABVLDL,  LDLCALC, CHOLHDL in the last 168 hours.  Hematology Recent Labs  Lab 11/05/21 1047 11/05/21 1511 11/05/21 1649 11/06/21 0312  WBC 5.6  --  7.1 7.8  RBC 3.21*  --  3.32* 3.17*  HGB 9.2* 8.8* 9.7* 9.2*  HCT 27.2* 26.0* 27.1* 26.1*  MCV 84.7  --  81.6 82.3  MCH 28.7  --  29.2 29.0  MCHC 33.8  --  35.8 35.2  RDW 17.8*  --  18.5* 19.2*  PLT 81*  --  100* 113*   Thyroid No results for input(s): TSH, FREET4 in the last 168 hours.  BNPNo results for input(s): BNP, PROBNP in the last 168 hours.  DDimer No results for input(s): DDIMER in the last 168 hours.   Radiology    DG CHEST PORT 1 VIEW  Result Date: 11/05/2021 CLINICAL DATA:  Bleeding EXAM: PORTABLE CHEST 1 VIEW COMPARISON:  X-ray abdomen 11/05/2021. chest x-ray 118 23:12 p.m. FINDINGS: Right chest wall internal jugular central venous catheter with tip overlying the expected region of the proximal superior vena cava approximately 7 cm from the superior cavoatrial junction. Enteric tube coursing below the hemidiaphragm coursing below diaphragm with tip and side port collimated off view. Adjacent to on the left coursing cranially of unclear etiology. Query epicardial pacing wires noted along the right heart border. Left inferior approach chest tube is noted with tip overlying the region of the left costophrenic angle. Previously identified mediastinal drain not visualized. The heart and mediastinal contours are unchanged. Status post aortic valve repair. No focal consolidation. No pulmonary edema. Trace left pleural effusion. Possible trace right pleural effusion. No pneumothorax. No acute osseous abnormality. IMPRESSION: 1. Right chest wall internal jugular central venous catheter with  tip overlying the expected region of the proximal superior vena cava approximately 7 cm from the superior cavoatrial junction. 2. Enteric tube coursing below the hemidiaphragm coursing below diaphragm with tip and side port collimated off view. Adjacent to on the  left coursing cranially of unclear etiology. Consider x-ray abdomen for further evaluation. 3. Trace left pleural effusion with inferior approach left chest tube noted. Electronically Signed   By: Iven Finn M.D.   On: 11/05/2021 18:55   DG CHEST PORT 1 VIEW  Result Date: 11/05/2021 CLINICAL DATA:  Encounter for orogastric tube placement. Status post exploration postoperative open heart for bleeding today. EXAM: PORTABLE CHEST 1 VIEW COMPARISON:  Chest radiograph dated November 05, 2021 at 5:08 a.m. FINDINGS: The heart is enlarged. Interval increase in size of the right pleural effusion/atelectasis. Low lung volumes. No appreciable pneumothorax. Endotracheal tube with distal tip approximately 3.6 cm above the carina. Feeding tube coursing below the diaphragm with distal tip not included. Right IJ access central line sheath is unchanged. Sternotomy wires are intact. IMPRESSION: 1. Endotracheal tube with distal tip projecting below the diaphragm and not included on the image. 2. Interval increase in bilateral pleural effusions, right greater than the left with bibasilar atelectasis. 3.  Lines and tubes as above. Electronically Signed   By: Keane Police D.O.   On: 11/05/2021 13:19   DG Chest Port 1 View  Result Date: 11/05/2021 CLINICAL DATA:  Status post open heart surgery. Endotracheal tube and chest tubes. EXAM: PORTABLE CHEST 1 VIEW COMPARISON:  Multiple prior chest radiographs including most recent dated November 04, 2021 FINDINGS: Endotracheal tube with distal tip approximately 3.5 cm above the carina, right IJ access central line, feeding tube and mediastinal and left pleural drain are unchanged. The heart is enlarged with evidence of CABG and prosthetic aortic valve. No appreciable pneumothorax. Bilateral lower lobe hazy opacities concerning for pleural effusion, underlying airspace disease can not be excluded. No acute osseous abnormality. IMPRESSION: 1.  Lines and tubes as above and unchanged. 2.    Stable cardiomegaly with postsurgical changes. 3. Small bilateral pleural effusions, right greater than the left with right basilar atelectasis, underlying airspace disease can not be excluded. Electronically Signed   By: Keane Police D.O.   On: 11/05/2021 09:01   DG Chest Port 1 View  Result Date: 11/04/2021 CLINICAL DATA:  Status post CABG, status post AVR. EXAM: PORTABLE CHEST 1 VIEW COMPARISON:  Chest x-ray 11/03/2021. FINDINGS: Endotracheal tube tip is 3.8 cm above carina. Enteric tube extends below the diaphragm. Right-sided central venous catheter tip projects over the proximal SVC. There are new sternotomy wires, prosthetic heart valve and mediastinal clips. Mediastinal and left pleural drainage catheters are also new from prior study. Cardiomediastinal silhouette is enlarged compatible with recent cardiac surgery. Lung volumes are low. There is minimal left basilar atelectasis. There is no pleural effusion or pneumothorax. No acute fractures are seen. IMPRESSION: 1. Low lung volumes with left basilar atelectasis. 2. Endotracheal tube tip 3.8 cm above carina. 3. Other lines and tubes/postsurgical changes as above. Electronically Signed   By: Ronney Asters M.D.   On: 11/04/2021 20:13   DG Abd Portable 1V  Result Date: 11/05/2021 CLINICAL DATA:  Status post laparotomy. EXAM: PORTABLE ABDOMEN - 1 VIEW COMPARISON:  None. FINDINGS: Enteric tube with the tip projecting over the antrum of the stomach. No bowel dilatation to suggest obstruction. No evidence of pneumoperitoneum, portal venous gas or pneumatosis. No pathologic calcifications along the expected course of the ureters.  Lap pad projecting over the right upper quadrant with 2 metallic needles. No acute osseous abnormality. IMPRESSION: Enteric tube with the tip projecting over the antrum of the stomach. Lap pad projecting over the right upper quadrant with 2 metallic needles. Electronically Signed   By: Kathreen Devoid M.D.   On: 11/05/2021 13:12    ECHO INTRAOPERATIVE TEE  Result Date: 11/05/2021  *INTRAOPERATIVE TRANSESOPHAGEAL REPORT *  Patient Name:   Joshua [name] Date of Exam: 11/04/2021 Medical Rec #:  [id number]        Height:       71.5 in Accession #:    [id number]       Weight:       181.5 lb Date of Birth:  09/22/1936       BSA:          2.03 m Patient Age:    44 years         BP:           109/76 mmHg Patient Gender: M                HR:           80 bpm. Exam Location:  Anesthesiology Transesophogeal exam was perform intraoperatively during surgical procedure. Patient was closely monitored under general anesthesia during the entirety of examination. Indications:     Aortic Stenosis i35.0, CAD Native Vessel i25.10 Sonographer:     Raquel Sarna Senior RDCS Performing Phys: [id number] Lucile Crater LIGHTFOOT Diagnosing Phys: Hoy Morn MD Complications: No known complications during this procedure. POST-OP IMPRESSIONS _ Left Ventricle: has normal systolic function, with an ejection fraction of 60%. The cavity size was decreased. The wall motion is normal. _ Right Ventricle: The right ventricle appears unchanged from pre-bypass. _ Aorta: The aorta appears unchanged from pre-bypass. _ Left Atrial Appendage: The left atrial appendage appears unchanged from pre-bypass. _ Aortic Valve: No stenosis present. No regurgitation post repair. Perivalvular leak noted.s/p 9mm Inspiris valve with small PVL noted and discussed with surgeon. _ Mitral Valve: The mitral valve appears unchanged from pre-bypass. _ Tricuspid Valve: The tricuspid valve appears unchanged from pre-bypass. _ Pulmonic Valve: The pulmonic valve appears unchanged from pre-bypass. _ Interatrial Septum: The interatrial septum appears unchanged from pre-bypass. _ Pericardium: The pericardium appears unchanged from pre-bypass. PRE-OP FINDINGS  Left Ventricle: The left ventricle has normal systolic function, with an ejection fraction of 60-65%. The cavity size was normal. There is mild concentric left  ventricular hypertrophy. Right Ventricle: The right ventricle has normal systolic function. The cavity was normal. There is no increase in right ventricular wall thickness. Left Atrium: Left atrial size was dilated. No left atrial/left atrial appendage thrombus was detected. Right Atrium: Right atrial size was normal in size. Interatrial Septum: No atrial level shunt detected by color flow Doppler. Pericardium: There is no evidence of pericardial effusion. Mitral Valve: The mitral valve is normal in structure. Mitral valve regurgitation is trivial by color flow Doppler. Tricuspid Valve: The tricuspid valve was normal in structure. Tricuspid valve regurgitation was not visualized by color flow Doppler. Aortic Valve: The aortic valve is tricuspid Aortic valve regurgitation is mild by color flow Doppler. There is severe stenosis of the aortic valve. Pulmonic Valve: The pulmonic valve was normal in structure. Pulmonic valve regurgitation is not visualized by color flow Doppler. Aorta: The aortic root, ascending aorta and aortic arch are normal in size and structure. There is evidence of plaque in the descending aorta. Pulmonary Artery: The pulmonary artery  is of normal size. +--------------+--------++  LEFT VENTRICLE            +--------------+--------++  PLAX 2D                   +--------------+--------++  LVOT diam:     2.40 cm    +--------------+--------++  LVOT Area:     4.52 cm   +--------------+--------++                            +--------------+--------++  +--------------+-------+  SHUNTS                  +--------------+-------+  Systemic Diam: 2.40 cm  +--------------+-------+  Hoy Morn MD Electronically signed by Hoy Morn MD Signature Date/Time: 11/05/2021/3:46:21 PM    Final     Cardiac Studies   Echocardiogram (10/08/2021) IMPRESSIONS   The aortic valve was not well visualized. Aortic valve regurgitation is not visualized. Severe aortic valve stenosis. Aortic valve area, by VTI measures  0.90 cm. Aortic valve mean gradient measures 58.0 mmHg. Aortic valve Vmax measures 4.89 m/s. 2. Left ventricular ejection fraction, by estimation, is 60 to 65%. The left ventricle has normal function. The left ventricle has no regional wall motion abnormalities. There is mild concentric left ventricular hypertrophy. Left ventricular diastolic parameters are consistent with Grade I diastolic dysfunction (impaired relaxation). There is a small intracavitary < 20 mmHg with Valsalva. 3. Right ventricular systolic function is normal. The right ventricular size is normal. There is normal pulmonary artery systolic pressure. 4. Left atrial size was mildly dilated. 5. The mitral valve is degenerative. No evidence of mitral valve regurgitation.   Comparison(s): EF 50%, AS mean gradient 28 mmHG, peak 46 mmHg. Interval increase to severe aortic stenosis.   Right/Left Heart Cath (11/03/2021)    Mid RCA lesion is 99% stenosed.   Prox RCA lesion is 60% stenosed.   Mid LM lesion is 40% stenosed.   Ost LAD to Prox LAD lesion is 70% stenosed.   Prox LAD to Mid LAD lesion is 40% stenosed.   Prox Cx to Mid Cx lesion is 40% stenosed.   Mild to moderate distal left main stenosis.  Severe, heavily calcified ostial LAD stenosis (RFR 0.78 suggesting the lesion is flow limiting).  Mild mid Circumflex stenosis Large dominant RCA with eccentric, nodular calcific lesion in the proximal vessel. This is a moderate stenosis. The mid RCA has a severe heavily calcified stenosis.  Normal right heart pressures Severe aortic stenosis by echo (aortic valve not crossed today)   Recommendations: He has severe, heavily calcified CAD involving the ostial LAD and the mid RCA. PCI of the LAD lesion would be high risk. I have reviewed his films with our interventional and structural team and I think the best option is to consider CABG and AVR. Given recent syncope, will admit him to the telemetry unit and have CT surgery see him  to review options for CABG with surgical AVR. He appears to be a good candidate for surgery    Patient Profile     86 y.o. male with history of severe aortic stenosis, HTN, HLD and PVCs who presented for planned cath as part of TAVR work-up found to have multivessel CAD now s/p 3v CABG with LIMA-LAD, SVG-PDA, SVG-OM and AVR with 86m inspiris resilia AoV. Post-op course complicated by acute blood loss anemia requiring take back to OR for re-look on 11/05/21.   Assessment & Plan    #  Multivessel CAD s/p CABG/AVR: Patient s/p 3v CABG with LIMA-LAD, SVG-PDA, SVG-OM and AVR on 1/17 with post-op course complicated by acute blood loss anemia requiring re-exploration on 01/18. Received multiple pRBCs, FFP, cryo and plt. Currently doing better and weaning down on pressors. Remains intubated but is alert and interactive this AM. -Post-op care per CV surgery -Off pressors -Continue ASA and statin -Will add GDMT as able in the future  #Severe AS s/p AVR: S/p 3v CABG and AVR with 62m Inspiris resilia prosthesis as detailed above. Post-op TEE on 11/04/21 with EF 60%, mild PVL. -Post-op care per CV surgery  #Acute Blood Loss Anemia: #Thrombocytopenia Required re-exploration on 11/05/21. Received multiple products. HgB stable this AM at 9.2>9.7>9.2. Plt improving 81>100>113. -S/p resuscitation with multiple units of pRBCs, FFP, plt and cryo -Trending HgB; currently stable -CT in place  #Anasarca: Anasarcic on exam and wt up in the setting of resuscitation with blood products.  -Diuresis per CV surgery; received lasix 436mIV once  #HTN: -Holding for now; just weaned off pressors  #HLD: -Continue lipitor [id number]     For questions or updates, please contact [email or web address] Please consult [email or web address] for contact info under        Signed, HeaFreada BergeronD  11/06/2021, 8:27 AM

## 2021-11-06 NOTE — Plan of Care (Signed)
°  Problem: Education: Goal: Understanding of CV disease, CV risk reduction, and recovery process will improve Outcome: Progressing Goal: Individualized Educational Video(s) Outcome: Progressing   Problem: Activity: Goal: Ability to return to baseline activity level will improve Outcome: Progressing   Problem: Cardiovascular: Goal: Ability to achieve and maintain adequate cardiovascular perfusion will improve Outcome: Progressing Goal: Vascular access site(s) Level 0-1 will be maintained Outcome: Progressing   Problem: Health Behavior/Discharge Planning: Goal: Ability to safely manage health-related needs after discharge will improve Outcome: Progressing   Problem: Clinical Measurements: Goal: Ability to maintain clinical measurements within normal limits will improve Outcome: Progressing Goal: Will remain free from infection Outcome: Progressing Goal: Diagnostic test results will improve Outcome: Progressing Goal: Respiratory complications will improve Outcome: Progressing Goal: Cardiovascular complication will be avoided Outcome: Progressing   Problem: Nutrition: Goal: Adequate nutrition will be maintained Outcome: Progressing   Problem: Coping: Goal: Level of anxiety will decrease Outcome: Progressing   

## 2021-11-06 NOTE — Progress Notes (Signed)
Progress Note  Patient Name: Joshua [name] Date of Encounter: 11/06/2021  [facility] Cardiologist: None   Subjective   Extubated, alert and awake. States he feels overall good this morning. No chest pain. Happy to have the breathing tube out.  Inpatient Medications    Scheduled Meds:  sodium chloride   Intravenous Once   sodium chloride   Intravenous Once   sodium chloride   Intravenous Once   sodium chloride   Intravenous Once   sodium chloride   Intravenous Once   sodium chloride   Intravenous Once   acetaminophen  1,000 mg Oral Q6H   Or   acetaminophen (TYLENOL) oral liquid 160 mg/5 mL  1,000 mg Per Tube Q6H   aspirin EC  325 mg Oral Daily   Or   aspirin  324 mg Per Tube Daily   atorvastatin  40 mg Per Tube Daily   bisacodyl  10 mg Oral Daily   Or   bisacodyl  10 mg Rectal Daily   chlorhexidine gluconate (MEDLINE KIT)  15 mL Mouth Rinse BID   Chlorhexidine Gluconate Cloth  6 each Topical Daily   docusate sodium  200 mg Oral Daily   insulin aspart  0-24 Units Subcutaneous Q4H   mouth rinse  15 mL Mouth Rinse 10 times per day   metoprolol tartrate  12.5 mg Oral BID   Or   metoprolol tartrate  12.5 mg Per Tube BID   pantoprazole  40 mg Oral Daily   sodium chloride flush  3 mL Intravenous Q12H   Continuous Infusions:  sodium chloride 10 mL/hr at 11/06/21 0600   sodium chloride     sodium chloride     sodium chloride      ceFAZolin (ANCEF) IV Stopped (11/06/21 0604)   dexmedetomidine (PRECEDEX) IV infusion 0.3 mcg/kg/hr (11/06/21 0500)   epinephrine Stopped (11/05/21 1032)   lactated ringers Stopped (11/05/21 1640)   lactated ringers Stopped (11/06/21 0752)   milrinone     nitroGLYCERIN 0 mcg/min (11/04/21 1941)   norepinephrine (LEVOPHED) Adult infusion 2 mcg/min (11/06/21 0800)   phenylephrine (NEO-SYNEPHRINE) Adult infusion Stopped (11/05/21 0532)   potassium chloride 50 mL/hr at 11/06/21 0800   PRN Meds: sodium chloride, metoprolol tartrate,  midazolam, morphine injection, ondansetron (ZOFRAN) IV, oxyCODONE, sodium chloride flush, traMADol   Vital Signs    Vitals:   11/06/21 0650 11/06/21 0700 11/06/21 0738 11/06/21 0800  BP:  114/79 137/60 118/74  Pulse:  88  88  Resp:  16  (!) 23  Temp:  99.7 F (37.6 C)    TempSrc:      SpO2: 94% 94% 94% 94%  Weight:      Height:        Intake/Output Summary (Last 24 hours) at 11/06/2021 0827 Last data filed at 11/06/2021 0800 Gross per 24 hour  Intake 4906.07 ml  Output 4555 ml  Net 351.07 ml   Last 3 Weights 11/06/2021 11/04/2021 11/03/2021  Weight (lbs) 228 lb 13.4 oz 181 lb 8 oz 185 lb  Weight (kg) 103.8 kg 82.328 kg 83.915 kg      Telemetry    A-V paced - Personally Reviewed  ECG    No new tracing - Personally Reviewed  Physical Exam   GEN: Sitting up in bed, alert and interactive  Neck: CVL in place Cardiac: RR, no murmurs Respiratory: Diminished at the bases GI: Soft, nontender, non-distended  MS: Anasarcic Neuro:  Nonfocal  Psych: Normal affect   Labs  High Sensitivity Troponin:  No results for input(s): TROPONINIHS in the last 720 hours.   Chemistry Recent Labs  Lab 11/03/21 2003 11/04/21 0428 11/05/21 1047 11/05/21 1511 11/05/21 1649 11/06/21 0312  NA 134*   < > 144 142 140 141  K 4.0   < > 3.7 4.0 3.9 3.7  CL 101   < > 103  --  103 107  CO2 21*   < > 22  --  24 26  GLUCOSE 149*   < > 177*  --  134* 136*  BUN 12   < > 10  --  11 12  CREATININE 0.95   < > 1.25*  --  1.30* 1.38*  CALCIUM 8.9   < > 7.4*  --  7.3* 7.1*  MG  --    < > 2.4  --  2.1 2.2  PROT 6.3*  --   --   --   --   --   ALBUMIN 3.5  --   --   --   --   --   AST 36  --   --   --   --   --   ALT 24  --   --   --   --   --   ALKPHOS 55  --   --   --   --   --   BILITOT 1.7*  --   --   --   --   --   GFRNONAA >60   < > 57*  --  55* 51*  ANIONGAP 12   < > 19*  --  13 8   < > = values in this interval not displayed.    Lipids No results for input(s): CHOL, TRIG, HDL, LABVLDL,  LDLCALC, CHOLHDL in the last 168 hours.  Hematology Recent Labs  Lab 11/05/21 1047 11/05/21 1511 11/05/21 1649 11/06/21 0312  WBC 5.6  --  7.1 7.8  RBC 3.21*  --  3.32* 3.17*  HGB 9.2* 8.8* 9.7* 9.2*  HCT 27.2* 26.0* 27.1* 26.1*  MCV 84.7  --  81.6 82.3  MCH 28.7  --  29.2 29.0  MCHC 33.8  --  35.8 35.2  RDW 17.8*  --  18.5* 19.2*  PLT 81*  --  100* 113*   Thyroid No results for input(s): TSH, FREET4 in the last 168 hours.  BNPNo results for input(s): BNP, PROBNP in the last 168 hours.  DDimer No results for input(s): DDIMER in the last 168 hours.   Radiology    DG CHEST PORT 1 VIEW  Result Date: 11/05/2021 CLINICAL DATA:  Bleeding EXAM: PORTABLE CHEST 1 VIEW COMPARISON:  X-ray abdomen 11/05/2021. chest x-ray 118 23:12 p.m. FINDINGS: Right chest wall internal jugular central venous catheter with tip overlying the expected region of the proximal superior vena cava approximately 7 cm from the superior cavoatrial junction. Enteric tube coursing below the hemidiaphragm coursing below diaphragm with tip and side port collimated off view. Adjacent to on the left coursing cranially of unclear etiology. Query epicardial pacing wires noted along the right heart border. Left inferior approach chest tube is noted with tip overlying the region of the left costophrenic angle. Previously identified mediastinal drain not visualized. The heart and mediastinal contours are unchanged. Status post aortic valve repair. No focal consolidation. No pulmonary edema. Trace left pleural effusion. Possible trace right pleural effusion. No pneumothorax. No acute osseous abnormality. IMPRESSION: 1. Right chest wall internal jugular central venous catheter with  tip overlying the expected region of the proximal superior vena cava approximately 7 cm from the superior cavoatrial junction. 2. Enteric tube coursing below the hemidiaphragm coursing below diaphragm with tip and side port collimated off view. Adjacent to on the  left coursing cranially of unclear etiology. Consider x-ray abdomen for further evaluation. 3. Trace left pleural effusion with inferior approach left chest tube noted. Electronically Signed   By: Iven Finn M.D.   On: 11/05/2021 18:55   DG CHEST PORT 1 VIEW  Result Date: 11/05/2021 CLINICAL DATA:  Encounter for orogastric tube placement. Status post exploration postoperative open heart for bleeding today. EXAM: PORTABLE CHEST 1 VIEW COMPARISON:  Chest radiograph dated November 05, 2021 at 5:08 a.m. FINDINGS: The heart is enlarged. Interval increase in size of the right pleural effusion/atelectasis. Low lung volumes. No appreciable pneumothorax. Endotracheal tube with distal tip approximately 3.6 cm above the carina. Feeding tube coursing below the diaphragm with distal tip not included. Right IJ access central line sheath is unchanged. Sternotomy wires are intact. IMPRESSION: 1. Endotracheal tube with distal tip projecting below the diaphragm and not included on the image. 2. Interval increase in bilateral pleural effusions, right greater than the left with bibasilar atelectasis. 3.  Lines and tubes as above. Electronically Signed   By: Keane Police D.O.   On: 11/05/2021 13:19   DG Chest Port 1 View  Result Date: 11/05/2021 CLINICAL DATA:  Status post open heart surgery. Endotracheal tube and chest tubes. EXAM: PORTABLE CHEST 1 VIEW COMPARISON:  Multiple prior chest radiographs including most recent dated November 04, 2021 FINDINGS: Endotracheal tube with distal tip approximately 3.5 cm above the carina, right IJ access central line, feeding tube and mediastinal and left pleural drain are unchanged. The heart is enlarged with evidence of CABG and prosthetic aortic valve. No appreciable pneumothorax. Bilateral lower lobe hazy opacities concerning for pleural effusion, underlying airspace disease can not be excluded. No acute osseous abnormality. IMPRESSION: 1.  Lines and tubes as above and unchanged. 2.    Stable cardiomegaly with postsurgical changes. 3. Small bilateral pleural effusions, right greater than the left with right basilar atelectasis, underlying airspace disease can not be excluded. Electronically Signed   By: Keane Police D.O.   On: 11/05/2021 09:01   DG Chest Port 1 View  Result Date: 11/04/2021 CLINICAL DATA:  Status post CABG, status post AVR. EXAM: PORTABLE CHEST 1 VIEW COMPARISON:  Chest x-ray 11/03/2021. FINDINGS: Endotracheal tube tip is 3.8 cm above carina. Enteric tube extends below the diaphragm. Right-sided central venous catheter tip projects over the proximal SVC. There are new sternotomy wires, prosthetic heart valve and mediastinal clips. Mediastinal and left pleural drainage catheters are also new from prior study. Cardiomediastinal silhouette is enlarged compatible with recent cardiac surgery. Lung volumes are low. There is minimal left basilar atelectasis. There is no pleural effusion or pneumothorax. No acute fractures are seen. IMPRESSION: 1. Low lung volumes with left basilar atelectasis. 2. Endotracheal tube tip 3.8 cm above carina. 3. Other lines and tubes/postsurgical changes as above. Electronically Signed   By: Ronney Asters M.D.   On: 11/04/2021 20:13   DG Abd Portable 1V  Result Date: 11/05/2021 CLINICAL DATA:  Status post laparotomy. EXAM: PORTABLE ABDOMEN - 1 VIEW COMPARISON:  None. FINDINGS: Enteric tube with the tip projecting over the antrum of the stomach. No bowel dilatation to suggest obstruction. No evidence of pneumoperitoneum, portal venous gas or pneumatosis. No pathologic calcifications along the expected course of the ureters.  Lap pad projecting over the right upper quadrant with 2 metallic needles. No acute osseous abnormality. IMPRESSION: Enteric tube with the tip projecting over the antrum of the stomach. Lap pad projecting over the right upper quadrant with 2 metallic needles. Electronically Signed   By: Kathreen Devoid M.D.   On: 11/05/2021 13:12    ECHO INTRAOPERATIVE TEE  Result Date: 11/05/2021  *INTRAOPERATIVE TRANSESOPHAGEAL REPORT *  Patient Name:   Joshua [name] Date of Exam: 11/04/2021 Medical Rec #:  [id number]        Height:       71.5 in Accession #:    [id number]       Weight:       181.5 lb Date of Birth:  Apr 23, 1936       BSA:          2.03 m Patient Age:    63 years         BP:           109/76 mmHg Patient Gender: M                HR:           80 bpm. Exam Location:  Anesthesiology Transesophogeal exam was perform intraoperatively during surgical procedure. Patient was closely monitored under general anesthesia during the entirety of examination. Indications:     Aortic Stenosis i35.0, CAD Native Vessel i25.10 Sonographer:     Raquel Sarna Senior RDCS Performing Phys: [id number] Lucile Crater LIGHTFOOT Diagnosing Phys: Hoy Morn MD Complications: No known complications during this procedure. POST-OP IMPRESSIONS _ Left Ventricle: has normal systolic function, with an ejection fraction of 60%. The cavity size was decreased. The wall motion is normal. _ Right Ventricle: The right ventricle appears unchanged from pre-bypass. _ Aorta: The aorta appears unchanged from pre-bypass. _ Left Atrial Appendage: The left atrial appendage appears unchanged from pre-bypass. _ Aortic Valve: No stenosis present. No regurgitation post repair. Perivalvular leak noted.s/p 40mm Inspiris valve with small PVL noted and discussed with surgeon. _ Mitral Valve: The mitral valve appears unchanged from pre-bypass. _ Tricuspid Valve: The tricuspid valve appears unchanged from pre-bypass. _ Pulmonic Valve: The pulmonic valve appears unchanged from pre-bypass. _ Interatrial Septum: The interatrial septum appears unchanged from pre-bypass. _ Pericardium: The pericardium appears unchanged from pre-bypass. PRE-OP FINDINGS  Left Ventricle: The left ventricle has normal systolic function, with an ejection fraction of 60-65%. The cavity size was normal. There is mild concentric left  ventricular hypertrophy. Right Ventricle: The right ventricle has normal systolic function. The cavity was normal. There is no increase in right ventricular wall thickness. Left Atrium: Left atrial size was dilated. No left atrial/left atrial appendage thrombus was detected. Right Atrium: Right atrial size was normal in size. Interatrial Septum: No atrial level shunt detected by color flow Doppler. Pericardium: There is no evidence of pericardial effusion. Mitral Valve: The mitral valve is normal in structure. Mitral valve regurgitation is trivial by color flow Doppler. Tricuspid Valve: The tricuspid valve was normal in structure. Tricuspid valve regurgitation was not visualized by color flow Doppler. Aortic Valve: The aortic valve is tricuspid Aortic valve regurgitation is mild by color flow Doppler. There is severe stenosis of the aortic valve. Pulmonic Valve: The pulmonic valve was normal in structure. Pulmonic valve regurgitation is not visualized by color flow Doppler. Aorta: The aortic root, ascending aorta and aortic arch are normal in size and structure. There is evidence of plaque in the descending aorta. Pulmonary Artery: The pulmonary artery  is of normal size. +--------------+--------++  LEFT VENTRICLE            +--------------+--------++  PLAX 2D                   +--------------+--------++  LVOT diam:     2.40 cm    +--------------+--------++  LVOT Area:     4.52 cm   +--------------+--------++                            +--------------+--------++  +--------------+-------+  SHUNTS                  +--------------+-------+  Systemic Diam: 2.40 cm  +--------------+-------+  Hoy Morn MD Electronically signed by Hoy Morn MD Signature Date/Time: 11/05/2021/3:46:21 PM    Final     Cardiac Studies   Echocardiogram (10/08/2021) IMPRESSIONS   The aortic valve was not well visualized. Aortic valve regurgitation is not visualized. Severe aortic valve stenosis. Aortic valve area, by VTI measures  0.90 cm. Aortic valve mean gradient measures 58.0 mmHg. Aortic valve Vmax measures 4.89 m/s. 2. Left ventricular ejection fraction, by estimation, is 60 to 65%. The left ventricle has normal function. The left ventricle has no regional wall motion abnormalities. There is mild concentric left ventricular hypertrophy. Left ventricular diastolic parameters are consistent with Grade I diastolic dysfunction (impaired relaxation). There is a small intracavitary < 20 mmHg with Valsalva. 3. Right ventricular systolic function is normal. The right ventricular size is normal. There is normal pulmonary artery systolic pressure. 4. Left atrial size was mildly dilated. 5. The mitral valve is degenerative. No evidence of mitral valve regurgitation.   Comparison(s): EF 50%, AS mean gradient 28 mmHG, peak 46 mmHg. Interval increase to severe aortic stenosis.   Right/Left Heart Cath (11/03/2021)    Mid RCA lesion is 99% stenosed.   Prox RCA lesion is 60% stenosed.   Mid LM lesion is 40% stenosed.   Ost LAD to Prox LAD lesion is 70% stenosed.   Prox LAD to Mid LAD lesion is 40% stenosed.   Prox Cx to Mid Cx lesion is 40% stenosed.   Mild to moderate distal left main stenosis.  Severe, heavily calcified ostial LAD stenosis (RFR 0.78 suggesting the lesion is flow limiting).  Mild mid Circumflex stenosis Large dominant RCA with eccentric, nodular calcific lesion in the proximal vessel. This is a moderate stenosis. The mid RCA has a severe heavily calcified stenosis.  Normal right heart pressures Severe aortic stenosis by echo (aortic valve not crossed today)   Recommendations: He has severe, heavily calcified CAD involving the ostial LAD and the mid RCA. PCI of the LAD lesion would be high risk. I have reviewed his films with our interventional and structural team and I think the best option is to consider CABG and AVR. Given recent syncope, will admit him to the telemetry unit and have CT surgery see him  to review options for CABG with surgical AVR. He appears to be a good candidate for surgery    Patient Profile     86 y.o. male with history of severe aortic stenosis, HTN, HLD and PVCs who presented for planned cath as part of TAVR work-up found to have multivessel CAD now s/p 3v CABG with LIMA-LAD, SVG-PDA, SVG-OM and AVR with 86m inspiris resilia AoV. Post-op course complicated by acute blood loss anemia requiring take back to OR for re-look on 11/05/21.   Assessment & Plan    #  Multivessel CAD s/p CABG/AVR: Patient s/p 3v CABG with LIMA-LAD, SVG-PDA, SVG-OM and AVR on 1/17 with post-op course complicated by acute blood loss anemia requiring re-exploration on 01/18. Received multiple pRBCs, FFP, cryo and plt. Currently doing better and weaning down on pressors. Remains intubated but is alert and interactive this AM. -Post-op care per CV surgery -Off pressors -Continue ASA and statin -Will add GDMT as able in the future  #Severe AS s/p AVR: S/p 3v CABG and AVR with 62m Inspiris resilia prosthesis as detailed above. Post-op TEE on 11/04/21 with EF 60%, mild PVL. -Post-op care per CV surgery  #Acute Blood Loss Anemia: #Thrombocytopenia Required re-exploration on 11/05/21. Received multiple products. HgB stable this AM at 9.2>9.7>9.2. Plt improving 81>100>113. -S/p resuscitation with multiple units of pRBCs, FFP, plt and cryo -Trending HgB; currently stable -CT in place  #Anasarca: Anasarcic on exam and wt up in the setting of resuscitation with blood products.  -Diuresis per CV surgery; received lasix 436mIV once  #HTN: -Holding for now; just weaned off pressors  #HLD: -Continue lipitor 4064maily     For questions or updates, please contact [facility] Please consult [email or web address] for contact info under        Signed, HeaFreada BergeronD  11/06/2021, 8:27 AM

## 2021-11-07 ENCOUNTER — Encounter (HOSPITAL_COMMUNITY): Payer: Self-pay | Admitting: Internal Medicine

## 2021-11-07 ENCOUNTER — Inpatient Hospital Stay (HOSPITAL_COMMUNITY): Payer: Medicare HMO

## 2021-11-07 ENCOUNTER — Encounter (HOSPITAL_COMMUNITY)
Admission: AD | Disposition: A | Discharge status: Skilled Nursing Facility | Payer: Self-pay | Source: Home / Self Care | Attending: Thoracic Surgery (Cardiothoracic Vascular Surgery)

## 2021-11-07 DIAGNOSIS — I442 Atrioventricular block, complete: Secondary | ICD-10-CM

## 2021-11-07 HISTORY — PX: TEMPORARY PACEMAKER: CATH118268

## 2021-11-07 LAB — BPAM RBC
Blood Product Expiration Date: 202301192359
Blood Product Expiration Date: 202301222359
Blood Product Expiration Date: 202301282359
Blood Product Expiration Date: 202301302359
Blood Product Expiration Date: 202301302359
Blood Product Expiration Date: 202301302359
Blood Product Expiration Date: 202301302359
Blood Product Expiration Date: 202301302359
Blood Product Expiration Date: 202301302359
Blood Product Expiration Date: 202301302359
Blood Product Expiration Date: 202301302359
Blood Product Expiration Date: 202302092359
Blood Product Expiration Date: 202302092359
ISSUE DATE / TIME: 202301171540
ISSUE DATE / TIME: 202301171540
ISSUE DATE / TIME: 202301180020
ISSUE DATE / TIME: 202301180020
ISSUE DATE / TIME: 202301180607
ISSUE DATE / TIME: 202301180654
ISSUE DATE / TIME: 202301180654
ISSUE DATE / TIME: 202301180719
ISSUE DATE / TIME: 202301180719
ISSUE DATE / TIME: 202301191100
ISSUE DATE / TIME: 202301191339
Unit Type and Rh: 7300
Unit Type and Rh: 7300
Unit Type and Rh: 7300
Unit Type and Rh: 7300
Unit Type and Rh: 7300
Unit Type and Rh: 7300
Unit Type and Rh: 7300
Unit Type and Rh: 7300
Unit Type and Rh: 7300
Unit Type and Rh: 7300
Unit Type and Rh: 7300
Unit Type and Rh: 7300
Unit Type and Rh: 7300

## 2021-11-07 LAB — BASIC METABOLIC PANEL
Anion gap: 7 (ref 5–15)
BUN: 16 mg/dL (ref 8–23)
CO2: 26 mmol/L (ref 22–32)
Calcium: 6.8 mg/dL — ABNORMAL LOW (ref 8.9–10.3)
Chloride: 105 mmol/L (ref 98–111)
Creatinine, Ser: 1.43 mg/dL — ABNORMAL HIGH (ref 0.61–1.24)
GFR, Estimated: 49 mL/min — ABNORMAL LOW (ref >60)
Glucose, Bld: 97 mg/dL (ref 70–99)
Potassium: 3.7 mmol/L (ref 3.5–5.1)
Sodium: 138 mmol/L (ref 135–145)

## 2021-11-07 LAB — TYPE AND SCREEN
ABO/RH(D): B POS
Antibody Screen: NEGATIVE
Unit division: 0
Unit division: 0
Unit division: 0
Unit division: 0
Unit division: 0
Unit division: 0
Unit division: 0
Unit division: 0
Unit division: 0
Unit division: 0
Unit division: 0
Unit division: 0
Unit division: 0

## 2021-11-07 LAB — GLUCOSE, CAPILLARY: Glucose-Capillary: 100 mg/dL — ABNORMAL HIGH (ref 70–99)

## 2021-11-07 LAB — CBC
HCT: 27.1 % — ABNORMAL LOW (ref 39.0–52.0)
Hemoglobin: 9.3 g/dL — ABNORMAL LOW (ref 13.0–17.0)
MCH: 29.2 pg (ref 26.0–34.0)
MCHC: 34.3 g/dL (ref 30.0–36.0)
MCV: 85.2 fL (ref 80.0–100.0)
Platelets: 101 10*3/uL — ABNORMAL LOW (ref 150–400)
RBC: 3.18 MIL/uL — ABNORMAL LOW (ref 4.22–5.81)
RDW: 20 % — ABNORMAL HIGH (ref 11.5–15.5)
WBC: 9.5 10*3/uL (ref 4.0–10.5)
nRBC: 0.4 % — ABNORMAL HIGH (ref 0.0–0.2)

## 2021-11-07 SURGERY — TEMPORARY PACEMAKER
Anesthesia: LOCAL

## 2021-11-07 MED ORDER — METOPROLOL TARTRATE 25 MG/10 ML ORAL SUSPENSION: 25.0000 mg | Freq: Two times a day (BID) | ORAL | Status: DC

## 2021-11-07 MED ORDER — LIDOCAINE HCL (PF) 1 % IJ SOLN
INTRAMUSCULAR | Status: AC
  Filled 2021-11-07: qty 30

## 2021-11-07 MED ORDER — HEPARIN (PORCINE) IN NACL 1000-0.9 UT/500ML-% IV SOLN
INTRAVENOUS | Status: AC
  Filled 2021-11-07: qty 500

## 2021-11-07 MED ORDER — FUROSEMIDE 10 MG/ML IJ SOLN
40.0000 mg | Freq: Two times a day (BID) | INTRAMUSCULAR | Status: AC
  Administered 2021-11-07 (×2): 40 mg via INTRAVENOUS
  Filled 2021-11-07 (×2): qty 4

## 2021-11-07 MED ORDER — MIDAZOLAM HCL 2 MG/2ML IJ SOLN
INTRAMUSCULAR | Status: AC
  Filled 2021-11-07: qty 2

## 2021-11-07 MED ORDER — SODIUM CHLORIDE 0.9 % IV SOLN
INTRAVENOUS | Status: AC | PRN
  Administered 2021-11-07: 20 mL/h via INTRAVENOUS

## 2021-11-07 MED ORDER — LIDOCAINE HCL (PF) 1 % IJ SOLN
INTRAMUSCULAR | Status: DC | PRN
  Administered 2021-11-07: 10 mL

## 2021-11-07 MED ORDER — FENTANYL CITRATE (PF) 100 MCG/2ML IJ SOLN
INTRAMUSCULAR | Status: DC | PRN
  Administered 2021-11-07: 25 ug via INTRAVENOUS

## 2021-11-07 MED ORDER — ZOLPIDEM TARTRATE 5 MG PO TABS
5.0000 mg | ORAL_TABLET | Freq: Every evening | ORAL | Status: DC | PRN
  Administered 2021-11-07 – 2021-11-20 (×12): 5 mg via ORAL
  Filled 2021-11-07 (×13): qty 1

## 2021-11-07 MED ORDER — SODIUM CHLORIDE 0.9% FLUSH
3.0000 mL | Freq: Two times a day (BID) | INTRAVENOUS | Status: DC
  Administered 2021-11-07 – 2021-11-08 (×3): 3 mL via INTRAVENOUS

## 2021-11-07 MED ORDER — METOPROLOL TARTRATE 25 MG PO TABS: 25.0000 mg | ORAL_TABLET | Freq: Two times a day (BID) | ORAL | Status: DC

## 2021-11-07 MED ORDER — SODIUM CHLORIDE 0.9% FLUSH: 3.0000 mL | Freq: Two times a day (BID) | INTRAVENOUS | Status: DC

## 2021-11-07 MED ORDER — FENTANYL CITRATE (PF) 100 MCG/2ML IJ SOLN
INTRAMUSCULAR | Status: AC
  Filled 2021-11-07: qty 2

## 2021-11-07 MED ORDER — MIDAZOLAM HCL 2 MG/2ML IJ SOLN
INTRAMUSCULAR | Status: DC | PRN
  Administered 2021-11-07: 1 mg via INTRAVENOUS

## 2021-11-07 MED ORDER — POTASSIUM CHLORIDE CRYS ER 20 MEQ PO TBCR
20.0000 meq | EXTENDED_RELEASE_TABLET | Freq: Every day | ORAL | Status: DC
  Administered 2021-11-07 – 2021-11-11 (×5): 20 meq via ORAL
  Filled 2021-11-07 (×5): qty 1

## 2021-11-07 MED ORDER — HEPARIN (PORCINE) IN NACL 1000-0.9 UT/500ML-% IV SOLN
INTRAVENOUS | Status: DC | PRN
  Administered 2021-11-07: 500 mL

## 2021-11-07 SURGICAL SUPPLY — 9 items
CABLE ADAPT PACING TEMP 12FT (ADAPTER) ×1 IMPLANT
PACK CARDIAC CATHETERIZATION (CUSTOM PROCEDURE TRAY) ×1 IMPLANT
PROTECTION STATION PRESSURIZED (MISCELLANEOUS) ×2
SHEATH PINNACLE 6F 10CM (SHEATH) ×1 IMPLANT
SHEATH PROBE COVER 6X72 (BAG) ×1 IMPLANT
SLEEVE REPOSITIONING LENGTH 30 (MISCELLANEOUS) ×1 IMPLANT
STATION PROTECTION PRESSURIZED (MISCELLANEOUS) IMPLANT
WIRE MICRO SET SILHO 5FR 7 (SHEATH) ×1 IMPLANT
WIRE PACING TEMP ST TIP 5 (WIRE) ×1 IMPLANT

## 2021-11-07 NOTE — Progress Notes (Signed)
° °   °  [address]       [place] [address]             [phone]      POD # 3/2 S/p pacemaker placement Resting comfortably  BP 140/78    Pulse 84    Temp 98.5 F (36.9 C) (Oral)    Resp 19    Ht 5' 11.5" (1.816 m)    Wt 98.7 kg    SpO2 96%    BMI 29.93 kg/m    Intake/Output Summary (Last 24 hours) at 11/07/2021 1759 Last data filed at 11/07/2021 1600 Gross per 24 hour  Intake 260 ml  Output 3600 ml  Net -3340 ml   Continue current Rx  Rebecca Cairns C. Roxan Hockey, MD [facility] [phone]

## 2021-11-07 NOTE — Op Note (Addendum)
NAMEOMERO, Joshua [name] MEDICAL RECORD NO: [id number] ACCOUNT NO: [id number] DATE OF BIRTH: 10/28/1935 FACILITY: [facility] LOCATION: [facility] PHYSICIAN: Revonda Standard. Roxan Hockey, MD  Operative Report   DATE OF PROCEDURE: 11/05/2021  PREOPERATIVE DIAGNOSIS:  Bleeding after open heart surgery.  POSTOPERATIVE DIAGNOSIS:  Bleeding after open heart surgery.  PROCEDURE:  Reexploration of mediastinum for bleeding.  SURGEON: Modesto Charon, MD  ASSISTANT: Lars Pinks, PA.  ANESTHESIA:  General.  FINDINGS:  Extensive clot in the pericardium and a large amount of blood in the left pleural space.  Multiple small bleeding sites.  No definite culprit bleed identified.  CLINICAL NOTE:  The patient is an 86 year old gentleman who had undergone aortic valve replacement and coronary bypass grafting x 3 on 11/04/2021.  His initial postoperative course was unremarkable, but early in the morning on 11/05/2021, he was noted to  have significant bleeding.  He was found to be severely coagulopathic.  That was treated, but there was ongoing bleeding.  The patient was intubated and unable to participate in decision making, so I spoke with his son and advised that we needed to  return to the operating room for reexploration.  The indications, risks, benefits, and alternatives were discussed with the patient's son.  He gave consent.  The OR was notified.  OPERATIVE NOTE: The patient was brought to the operating room on 11/05/2021.  He was already intubated.  He had induction of general anesthesia.  He did have significant hypotension and loss of pacing capture with movement from the bed to the table.  He  was given intravenous epinephrine, but did not require CPR.  The chest, abdomen and legs were prepped and draped in the usual sterile fashion.  A timeout was performed.  The sternotomy incision was reopened.  The sternal wires were removed and a sternal retractor was placed.  There was a large amount of clot in  the anterior mediastinum.  There also was clot around the right atrium and  significantly there was clot along the diaphragmatic surface of the heart and the ventricular pacing wire was partially dislodged which would explain his episodes of loss of capture.  There was bleeding at that site.  Inspection of the aortotomy showed  no bleeding, no evidence of any bleeding from the proximal anastomoses.  The left chest had a large amount of blood, which was evacuated and clot was evacuated from the chest.  Systematic inspection then was begun beginning superiorly, inspecting the  skin and subcutaneous tissue and then the sternum.  The mammary bed was inspected and there were several small vessels that were oozing.  The mammary pedicle itself had no bleeding.  There were small vasa vasorum type bleeding vessels along the vein to the right coronary  artery, which were clipped.  There was also some bleeding from the area of the pacing wire exit through the chest wall, but no sites that would explain the large amount of blood and clot that were identified.  The chest was copiously irrigated with warm  saline on multiple occasions and continued inspection was performed.  All the while, correction of coagulopathy, thrombocytopenia and anemia was performed with resuscitation by the anesthesia service based on the thromboelastogram and lab values.   Epicardial pacing wires were placed on the right ventricle and a new bipolar wire was placed as well, so there were two sets of ventricular wires.  The atrial pacing wire that was in place was left in place and then additional pacing wires were placed on  the atrium as well for backup.  The patient was pacer dependent with a very slow ventricular escape in the 30s.  A 28-French Blake drain was placed in the left pleural space and one was placed also into the pericardium along the diaphragmatic surface  and then finally one was placed anteriorly as well. With no obvious  ongoing bleeding, the sternum was reapproximated with interrupted single and double heavy gauge stainless steel wires.  Pectoralis fascia, subcutaneous tissue and skin were closed in  standard fashion.  The chest tubes were placed to suction.  All sponge, needle, and instrument counts were correct at the end the procedure.  The patient was observed in the operating room with no significant bleeding.  Therefore, he was transported from the operating room to the surgical intensive care unit.  Experienced assistance was necessary to this case due to complexity and surgical standard of care.  Lars Pinks, PA served as the assistant for this case, providing retraction and exposure, suctioning, suturing and other services as needed.   MUK D: 11/06/2021 6:44:40 pm T: 11/07/2021 12:03:00 am  JOB: [id number]/ [id number]   Addendum Corrected date to 11/05/2021

## 2021-11-07 NOTE — Interval H&P Note (Signed)
History and Physical Interval Note:  11/07/2021 10:24 AM  Joshua [name]  has presented today for surgery, with the diagnosis of chest pain.  The various methods of treatment have been discussed with the patient and family. After consideration of risks, benefits and other options for treatment, the patient has consented to  Procedure(s): TEMPORARY PACEMAKER (N/A) as a surgical intervention.  The patient's history has been reviewed, patient examined, no change in status, stable for surgery.  I have reviewed the patient's chart and labs.  Questions were answered to the patient's satisfaction.     [name]

## 2021-11-07 NOTE — Plan of Care (Signed)
°  Problem: Education: Goal: Understanding of CV disease, CV risk reduction, and recovery process will improve Outcome: Progressing Goal: Individualized Educational Video(s) Outcome: Progressing   Problem: Activity: Goal: Ability to return to baseline activity level will improve Outcome: Progressing   Problem: Cardiovascular: Goal: Ability to achieve and maintain adequate cardiovascular perfusion will improve Outcome: Progressing Goal: Vascular access site(s) Level 0-1 will be maintained Outcome: Progressing   Problem: Education: Goal: Knowledge of General Education information will improve Description: Including pain rating scale, medication(s)/side effects and non-pharmacologic comfort measures Outcome: Progressing   Problem: Health Behavior/Discharge Planning: Goal: Ability to manage health-related needs will improve Outcome: Progressing   Problem: Clinical Measurements: Goal: Ability to maintain clinical measurements within normal limits will improve Outcome: Progressing Goal: Will remain free from infection Outcome: Progressing Goal: Diagnostic test results will improve Outcome: Progressing Goal: Respiratory complications will improve Outcome: Progressing Goal: Cardiovascular complication will be avoided Outcome: Progressing   Problem: Activity: Goal: Risk for activity intolerance will decrease Outcome: Progressing

## 2021-11-07 NOTE — Progress Notes (Signed)
PT Cancellation Note  Patient Details Name: Joshua [name] MRN: [id number] DOB: [birth year]   Cancelled Treatment:    Reason Eval/Treat Not Completed: Medical issues which prohibited therapy. Pt needs temporary pacer.    Shary Decamp [facility] 11/07/2021, 9:55 AM [place] Pager [phone] Office [phone]

## 2021-11-07 NOTE — Consult Note (Signed)
Cardiology Consultation:   Patient ID: Joshua [name] MRN: [id number]; DOB: [birth year]  Admit date: 11/03/2021 Date of Consult: 11/07/2021  PCP:  Shelda Pal, DO   [place] Providers Cardiologist:  Dr. Marisue Ivan  Patient Profile:   Joshua [name] is a 86 y.o. male with a hx of HLD, HTN, VHD, CAD who is being seen 11/07/2021 for the evaluation of post-op CHB at the request of Dr. Kipp Brood.  History of Present Illness:   Joshua [name] was admitted 11/03/21 for cardiac cath to evaluate further his AS and evaluate for CAD. He was found to have severe AS and multivessel CAD Underwent CABG (LIMA-LAD, SVG > PDA, SVG > OM) and AVR (bioprosthetic, 23 mm Inspiris valve on 11/04/21) Had post-op bleeding that slowed with correction of his coags, though went back to the OR 10/26/21 for re-exploration and had further PRBC and FFP and a unit of cryo Extubated 11/06/21 Has done well since Chest tube remains in place Remains pacer dependent today and EP is asked to see  In review of his telemetry he has been having intermittent LOC via his epicardial wires and remains dependent this AM   LABS K+ 3.7 BUN/Creat 16/1.43 WBC 9.5 H/H 9.3/27.1 Plts 101  11/06/21 PT 16.4 INR 1.3  No nodal blocking agents on board, last dose of metoprolol was 1/17 No perssors or gtts  Pt is in bed, comfortable, no CP, no SOB, going through his mail, visiting with family  Past Medical History:  Diagnosis Date   Arthritis    Cataract    Esophageal stricture    GERD (gastroesophageal reflux disease)    Hyperlipidemia    Hypertension    Liver disease    hepatitis   Severe aortic stenosis     Past Surgical History:  Procedure Laterality Date   AORTIC VALVE REPLACEMENT N/A 11/04/2021   Procedure: AORTIC VALVE REPLACEMENT (AVR) USING 23 MM INSPIRIS RESILIA  AORTIC VALVE;  Surgeon: Lajuana Matte, MD;  Location: [place];  Service: Open Heart Surgery;  Laterality: N/A;   cataract  2013    CORONARY ARTERY BYPASS GRAFT N/A 11/04/2021   Procedure: CORONARY ARTERY BYPASS GRAFTING (CABG) X 3, USING LEFT INTERNAL MAMMARY ARTERY AND ENDOSCOPICALLY HARVESTED RIGHT GREATER SAPHENOUS VEIN. LIMA TO LAD, SVG TO PDA, SVG TO OM;  Surgeon: Lajuana Matte, MD;  Location: [place];  Service: Open Heart Surgery;  Laterality: N/A;   ENDOVEIN HARVEST OF GREATER SAPHENOUS VEIN Right 11/04/2021   Procedure: ENDOVEIN HARVEST OF GREATER SAPHENOUS VEIN;  Surgeon: Lajuana Matte, MD;  Location: [place];  Service: Open Heart Surgery;  Laterality: Right;   EXPLORATION POST OPERATIVE OPEN HEART N/A 11/05/2021   Procedure: EXPLORATION POST OPERATIVE OPEN HEARTFOR BLEEDING;  Surgeon: Melrose Nakayama, MD;  Location: [place];  Service: Open Heart Surgery;  Laterality: N/A;   HEMORROIDECTOMY  2001   HERNIA REPAIR  2005   INTRAVASCULAR PRESSURE WIRE/FFR STUDY N/A 11/03/2021   Procedure: INTRAVASCULAR PRESSURE WIRE/FFR STUDY;  Surgeon: Burnell Blanks, MD;  Location: [place] [facility];  Service: Cardiovascular;  Laterality: N/A;   RIGHT/LEFT HEART CATH AND CORONARY ANGIOGRAPHY N/A 11/03/2021   Procedure: RIGHT/LEFT HEART CATH AND CORONARY ANGIOGRAPHY;  Surgeon: Burnell Blanks, MD;  Location: [place] [facility];  Service: Cardiovascular;  Laterality: N/A;   TEE WITHOUT CARDIOVERSION N/A 11/04/2021   Procedure: TRANSESOPHAGEAL ECHOCARDIOGRAM (TEE);  Surgeon: Lajuana Matte, MD;  Location: [place];  Service: Open Heart Surgery;  Laterality: N/A;  TONSILLECTOMY  1948     Home Medications:  Prior to Admission medications   Medication Sig Start Date End Date Taking? Authorizing Provider  aspirin EC 81 MG tablet Take 1 tablet (81 mg total) by mouth daily. Swallow whole. 10/29/21  Yes Burnell Blanks, MD  atorvastatin (LIPITOR) 20 MG tablet TAKE 1 TABLET  DAILY AT 6 PM. 07/07/21  Yes Wendling, Crosby Oyster, DO  Glucosamine-Chondroit-Vit C-Mn (GLUCOSAMINE 1500 COMPLEX PO) Take 2 tablets  by mouth every other day.   Yes [provider]  ketoconazole (NIZORAL) 2 % shampoo Apply 1 application topically once a week. 04/18/18  Yes [provider]  lisinopril (ZESTRIL) 20 MG tablet TAKE 1 TABLET EVERY DAY 04/16/21  Yes Wendling, Crosby Oyster, DO  Multiple Vitamin (MULTIVITAMIN) tablet Take 1 tablet by mouth daily.   Yes [provider]  naproxen sodium (ALEVE) 220 MG tablet Take 220 mg by mouth daily as needed (pain).   Yes [provider]  omeprazole (PRILOSEC) 40 MG capsule TAKE 1 CAPSULE EVERY DAY 05/26/21  Yes Wendling, Crosby Oyster, DO  vitamin B-12 (CYANOCOBALAMIN) 1000 MCG tablet Take 1,000 mcg by mouth daily.   Yes [provider]  metoprolol tartrate (LOPRESSOR) 50 MG tablet Take one tablet by mouth prior to 1/13 CT scan 10/29/21   Burnell Blanks, MD    Inpatient Medications: Scheduled Meds:  acetaminophen  1,000 mg Oral Q6H   Or   acetaminophen (TYLENOL) oral liquid 160 mg/5 mL  1,000 mg Per Tube Q6H   aspirin EC  325 mg Oral Daily   Or   aspirin  324 mg Per Tube Daily   atorvastatin  40 mg Per Tube Daily   bisacodyl  10 mg Oral Daily   Or   bisacodyl  10 mg Rectal Daily   Chlorhexidine Gluconate Cloth  6 each Topical Daily   docusate sodium  200 mg Oral Daily   furosemide  40 mg Intravenous BID   mouth rinse  15 mL Mouth Rinse BID   pantoprazole  40 mg Oral Daily   potassium chloride  20 mEq Oral Daily   sodium chloride flush  3 mL Intravenous Q12H   Continuous Infusions:  sodium chloride 10 mL/hr at 11/06/21 0600   sodium chloride     sodium chloride     lactated ringers Stopped (11/06/21 0752)   PRN Meds: sodium chloride, metoprolol tartrate, midazolam, morphine injection, ondansetron (ZOFRAN) IV, oxyCODONE, sodium chloride flush, traMADol, zolpidem  Allergies:   No Known Allergies  Social History:   Social History   Socioeconomic History   Marital status: Widowed    Spouse name: Not on file    Number of children: 3   Years of education: Not on file   Highest education level: Not on file  Occupational History   Occupation: retired Estate agent for [employer]   Occupation: Radiographer, 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  Tobacco Use   Smoking status: Former    Types: Cigarettes    Quit date: 1965    Years since quitting: 58.0   Smokeless tobacco: Never  Substance and Sexual Activity   Alcohol use: Yes    Alcohol/week: 21.0 standard drinks    Types: 21 Shots of liquor per week    Comment: daily a few cocktails   Drug use: No   Sexual activity: Never  Other Topics Concern   Not on file  Social History Narrative   Epworth Sleepiness Scale = 3 (as of 07/15/2016)   Social  Determinants of Health   Financial Resource Strain: Low Risk    Difficulty of Paying Living Expenses: Not hard at all  Food Insecurity: No Food Insecurity   Worried About [place] in the Last Year: Never true   Ran Out of Food in the Last Year: Never true  Transportation Needs: No Transportation Needs   Lack of Transportation (Medical): No   Lack of Transportation (Non-Medical): No  Physical Activity: Insufficiently Active   Days of Exercise per Week: 3 days   Minutes of Exercise per Session: 30 min  Stress: No Stress Concern Present   Feeling of Stress : Not at all  Social Connections: Moderately Integrated   Frequency of Communication with Friends and Family: Once a week   Frequency of Social Gatherings with Friends and Family: Three times a week   Attends Religious Services: More than 4 times per year   Active Member of Clubs or Organizations: Yes   Attends Archivist Meetings: 1 to 4 times per year   Marital Status: Widowed  Human resources officer Violence: Not At Risk   Fear of Current or Ex-Partner: No   Emotionally Abused: No   Physically Abused: No   Sexually Abused: No    Family History:   Family History  Problem Relation Age of Onset   Heart failure Mother 34   Heart failure Father 59        Scarlet fever as child-Valve disease   Cancer Maternal Grandmother    Diabetes Child    Heart Problems Child 81       cardiac arrest     ROS:  Please see the history of present illness.  All other ROS reviewed and negative.     Physical Exam/Data:   Vitals:   11/07/21 0738 11/07/21 0800 11/07/21 0823 11/07/21 0900  BP:   134/65 130/72  Pulse: 88 89 89 84  Resp: (!) 23 20 16  (!) 27  Temp: 98.2 F (36.8 C)     TempSrc: Oral     SpO2: 95% 94% 92% 96%  Weight:      Height:        Intake/Output Summary (Last 24 hours) at 11/07/2021 1006 Last data filed at 11/07/2021 0900 Gross per 24 hour  Intake 240 ml  Output 3180 ml  Net -2940 ml   Last 3 Weights 11/07/2021 11/06/2021 11/04/2021  Weight (lbs) 217 lb 9.5 oz 228 lb 13.4 oz 181 lb 8 oz  Weight (kg) 98.7 kg 103.8 kg 82.328 kg     Body mass index is 29.93 kg/m.  General:  Well nourished, well developed, in no acute distress HEENT: normal Neck: no JVD Vascular: No carotid bruits; Distal pulses 2+ bilaterally Cardiac:  RRR; no murmurs, gallops or rubs Thoracotomy dressing in place, no evidence of ongoing/active bleeding Lungs:  CTA b/l, no wheezing, rhonchi or rales  Abd: soft, nontender  Ext: no edema Musculoskeletal:  No deformities, age appropriate atrophy Skin: warm and dry  Neuro:  no gross focal abnormalities noted Psych:  Normal affect   EKG:  The EKG was personally reviewed and demonstrates:   AV paced  Telemetry:  Telemetry was personally reviewed and demonstrates:   AV paced, he has had in the last 24 hours a number of episodes of loss of capture at all hours of the day, he without pacing is dependent with a escape in the high 20's-30 range  Relevant CV Studies:  ECHO INTRAOPERATIVE TEE Result Date: 11/05/2021  *INTRAOPERATIVE TRANSESOPHAGEAL  REPORT *  Patient Name:   Joshua [name] Date of Exam: 11/04/2021 Medical Rec #:  [id number]        Height:       71.5 in Accession #:    [id number]       Weight:        181.5 lb Date of Birth:  1936-07-15       BSA:          2.03 m Patient Age:    11 years         BP:           109/76 mmHg Patient Gender: M                HR:           80 bpm. Exam Location:  Anesthesiology Transesophogeal exam was perform intraoperatively during surgical procedure. Patient was closely monitored under general anesthesia during the entirety of examination. Indications:     Aortic Stenosis i35.0, CAD Native Vessel i25.10 Sonographer:     Raquel Sarna Senior RDCS Performing Phys: [id number] Lucile Crater LIGHTFOOT Diagnosing Phys: Hoy Morn MD Complications: No known complications during this procedure. POST-OP IMPRESSIONS _ Left Ventricle: has normal systolic function, with an ejection fraction of 60%. The cavity size was decreased. The wall motion is normal. _ Right Ventricle: The right ventricle appears unchanged from pre-bypass. _ Aorta: The aorta appears unchanged from pre-bypass. _ Left Atrial Appendage: The left atrial appendage appears unchanged from pre-bypass. _ Aortic Valve: No stenosis present. No regurgitation post repair. Perivalvular leak noted.s/p 58mm Inspiris valve with small PVL noted and discussed with surgeon. _ Mitral Valve: The mitral valve appears unchanged from pre-bypass. _ Tricuspid Valve: The tricuspid valve appears unchanged from pre-bypass. _ Pulmonic Valve: The pulmonic valve appears unchanged from pre-bypass. _ Interatrial Septum: The interatrial septum appears unchanged from pre-bypass. _ Pericardium: The pericardium appears unchanged from pre-bypass. PRE-OP FINDINGS  Left Ventricle: The left ventricle has normal systolic function, with an ejection fraction of 60-65%. The cavity size was normal. There is mild concentric left ventricular hypertrophy. Right Ventricle: The right ventricle has normal systolic function. The cavity was normal. There is no increase in right ventricular wall thickness. Left Atrium: Left atrial size was dilated. No left atrial/left atrial appendage  thrombus was detected. Right Atrium: Right atrial size was normal in size. Interatrial Septum: No atrial level shunt detected by color flow Doppler. Pericardium: There is no evidence of pericardial effusion. Mitral Valve: The mitral valve is normal in structure. Mitral valve regurgitation is trivial by color flow Doppler. Tricuspid Valve: The tricuspid valve was normal in structure. Tricuspid valve regurgitation was not visualized by color flow Doppler. Aortic Valve: The aortic valve is tricuspid Aortic valve regurgitation is mild by color flow Doppler. There is severe stenosis of the aortic valve. Pulmonic Valve: The pulmonic valve was normal in structure. Pulmonic valve regurgitation is not visualized by color flow Doppler. Aorta: The aortic root, ascending aorta and aortic arch are normal in size and structure. There is evidence of plaque in the descending aorta. Pulmonary Artery: The pulmonary artery is of normal size  Laboratory Data:  High Sensitivity Troponin:  No results for input(s): TROPONINIHS in the last 720 hours.   Chemistry Recent Labs  Lab 11/05/21 1047 11/05/21 1511 11/05/21 1649 11/06/21 0312 11/06/21 0716 11/06/21 0925 11/07/21 0518  NA 144   < > 140 141 142 141 138  K 3.7   < > 3.9 3.7 3.8 3.6 3.7  CL 103  --  103 107  --   --  105  CO2 22  --  24 26  --   --  26  GLUCOSE 177*  --  134* 136*  --   --  97  BUN 10  --  11 12  --   --  16  CREATININE 1.25*  --  1.30* 1.38*  --   --  1.43*  CALCIUM 7.4*  --  7.3* 7.1*  --   --  6.8*  MG 2.4  --  2.1 2.2  --   --   --   GFRNONAA 57*  --  55* 51*  --   --  49*  ANIONGAP 19*  --  13 8  --   --  7   < > = values in this interval not displayed.    Recent Labs  Lab 11/03/21 2003  PROT 6.3*  ALBUMIN 3.5  AST 36  ALT 24  ALKPHOS 55  BILITOT 1.7*   Lipids No results for input(s): CHOL, TRIG, HDL, LABVLDL, LDLCALC, CHOLHDL in the last 168 hours.  Hematology Recent Labs  Lab 11/05/21 1649 11/06/21 0312 11/06/21 0716  11/06/21 0925 11/07/21 0518  WBC 7.1 7.8  --   --  9.5  RBC 3.32* 3.17*  --   --  3.18*  HGB 9.7* 9.2* 8.8* 8.2* 9.3*  HCT 27.1* 26.1* 26.0* 24.0* 27.1*  MCV 81.6 82.3  --   --  85.2  MCH 29.2 29.0  --   --  29.2  MCHC 35.8 35.2  --   --  34.3  RDW 18.5* 19.2*  --   --  20.0*  PLT 100* 113*  --   --  101*   Thyroid No results for input(s): TSH, FREET4 in the last 168 hours.  BNPNo results for input(s): BNP, PROBNP in the last 168 hours.  DDimer No results for input(s): DDIMER in the last 168 hours.   Radiology/Studies:  CARDIAC CATHETERIZATION Result Date: 11/03/2021   Mid RCA lesion is 99% stenosed.   Prox RCA lesion is 60% stenosed.   Mid LM lesion is 40% stenosed.   Ost LAD to Prox LAD lesion is 70% stenosed.   Prox LAD to Mid LAD lesion is 40% stenosed.   Prox Cx to Mid Cx lesion is 40% stenosed. Mild to moderate distal left main stenosis. Severe, heavily calcified ostial LAD stenosis (RFR 0.78 suggesting the lesion is flow limiting). Mild mid Circumflex stenosis Large dominant RCA with eccentric, nodular calcific lesion in the proximal vessel. This is a moderate stenosis. The mid RCA has a severe heavily calcified stenosis. Normal right heart pressures Severe aortic stenosis by echo (aortic valve not crossed today) Recommendations: He has severe, heavily calcified CAD involving the ostial LAD and the mid RCA. PCI of the LAD lesion would be high risk. I have reviewed his films with our interventional and structural team and I think the best option is to consider CABG and AVR. Given recent syncope, will admit him to the telemetry unit and have CT surgery see him to review options for CABG with surgical AVR. He appears to be a good candidate for surgery.   DG Chest Port 1 View Result Date: 11/07/2021 CLINICAL DATA:  Chest tube, sore chest, post AVR EXAM: PORTABLE CHEST 1 VIEW COMPARISON:  Portable exam 0510 hours compared to 11/06/2021 FINDINGS: Interval removal of endotracheal and  nasogastric tubes. RIGHT jugular central venous catheter with tip projecting over  confluence of the SVC. Mediastinal drains and LEFT thoracostomy tube present. Normal heart size post CABG and AVR. Bibasilar atelectasis and small RIGHT pleural effusion. Remaining lungs clear. No pneumothorax. IMPRESSION: Bibasilar atelectasis and small RIGHT pleural effusion post cardiac surgery. Electronically Signed   By: Lavonia Dana M.D.   On: 11/07/2021 08:48     Assessment and Plan:   Post CABG/AVR CHB POD #3 today from initial operation POD #2 from his re-exploration  Coagulopathy and H/H better  Dr. Quentin Ore has seen the patient He is having LOC from his epicardial wires with a very slow escape underneath He will need a temp pacing wire  He most likely need PPM though still with chest tube in and not quite ready  Perhaps over the weekend/Sunday or next week     Risk Assessment/Risk Scores:    For questions or updates, please contact [place] Please consult [email or web address] for contact info under    Signed, Baldwin [place], PA-C  11/07/2021 10:06 AM

## 2021-11-07 NOTE — Progress Notes (Signed)
Progress Note  Patient Name: Joshua [name] Date of Encounter: 11/07/2021  [facility] Cardiologist: None   Subjective   Underwent TVP placement this AM as epicardial leads failed to capture and patient's underlying native rhythm was CHB with HR 20-30s.   Doing well. Sitting up comfortably in bed. States he is urinating well. No chest pain.    Inpatient Medications    Scheduled Meds:  acetaminophen  1,000 mg Oral Q6H   Or   acetaminophen (TYLENOL) oral liquid 160 mg/5 mL  1,000 mg Per Tube Q6H   aspirin EC  325 mg Oral Daily   Or   aspirin  324 mg Per Tube Daily   atorvastatin  40 mg Per Tube Daily   bisacodyl  10 mg Oral Daily   Or   bisacodyl  10 mg Rectal Daily   Chlorhexidine Gluconate Cloth  6 each Topical Daily   docusate sodium  200 mg Oral Daily   furosemide  40 mg Intravenous BID   mouth rinse  15 mL Mouth Rinse BID   pantoprazole  40 mg Oral Daily   potassium chloride  20 mEq Oral Daily   sodium chloride flush  3 mL Intravenous Q12H   sodium chloride flush  3 mL Intravenous Q12H   Continuous Infusions:  sodium chloride 10 mL/hr at 11/06/21 0600   sodium chloride     sodium chloride     lactated ringers Stopped (11/06/21 0752)   PRN Meds: sodium chloride, metoprolol tartrate, midazolam, morphine injection, ondansetron (ZOFRAN) IV, oxyCODONE, sodium chloride flush, traMADol, zolpidem   Vital Signs    Vitals:   11/07/21 1147 11/07/21 1200 11/07/21 1215 11/07/21 1230  BP: (!) 143/89 (!) 154/77 (!) 153/90 (!) 153/84  Pulse: 81 82 82 81  Resp: 11 17 10 14   Temp:      TempSrc:      SpO2: 91% 97% 97% 97%  Weight:      Height:        Intake/Output Summary (Last 24 hours) at 11/07/2021 1311 Last data filed at 11/07/2021 1200 Gross per 24 hour  Intake 240 ml  Output 3320 ml  Net -3080 ml    Last 3 Weights 11/07/2021 11/06/2021 11/04/2021  Weight (lbs) 217 lb 9.5 oz 228 lb 13.4 oz 181 lb 8 oz  Weight (kg) 98.7 kg 103.8 kg 82.328 kg       Telemetry    A-V pacing with intermittent LOC; now V-pacing - Personally Reviewed  ECG    No new tracing - Personally Reviewed  Physical Exam   GEN: Sitting up in bed, alert and interactive  Neck: TVP in place Cardiac: RR, no murmurs Respiratory: Diminished at the bases GI: Soft, nontender, non-distended  MS: Anasarcic but improving; warm Neuro:  Nonfocal  Psych: Normal affect   Labs    High Sensitivity Troponin:  No results for input(s): TROPONINIHS in the last 720 hours.   Chemistry Recent Labs  Lab 11/03/21 2003 11/04/21 0428 11/05/21 1047 11/05/21 1511 11/05/21 1649 11/06/21 0312 11/06/21 0716 11/06/21 0925 11/07/21 0518  NA 134*   < > 144   < > 140 141 142 141 138  K 4.0   < > 3.7   < > 3.9 3.7 3.8 3.6 3.7  CL 101   < > 103  --  103 107  --   --  105  CO2 21*   < > 22  --  24 26  --   --  26  GLUCOSE 149*   < > 177*  --  134* 136*  --   --  97  BUN 12   < > 10  --  11 12  --   --  16  CREATININE 0.95   < > 1.25*  --  1.30* 1.38*  --   --  1.43*  CALCIUM 8.9   < > 7.4*  --  7.3* 7.1*  --   --  6.8*  MG  --    < > 2.4  --  2.1 2.2  --   --   --   PROT 6.3*  --   --   --   --   --   --   --   --   ALBUMIN 3.5  --   --   --   --   --   --   --   --   AST 36  --   --   --   --   --   --   --   --   ALT 24  --   --   --   --   --   --   --   --   ALKPHOS 55  --   --   --   --   --   --   --   --   BILITOT 1.7*  --   --   --   --   --   --   --   --   GFRNONAA >60   < > 57*  --  55* 51*  --   --  49*  ANIONGAP 12   < > 19*  --  13 8  --   --  7   < > = values in this interval not displayed.     Lipids No results for input(s): CHOL, TRIG, HDL, LABVLDL, LDLCALC, CHOLHDL in the last 168 hours.  Hematology Recent Labs  Lab 11/05/21 1649 11/06/21 0312 11/06/21 0716 11/06/21 0925 11/07/21 0518  WBC 7.1 7.8  --   --  9.5  RBC 3.32* 3.17*  --   --  3.18*  HGB 9.7* 9.2* 8.8* 8.2* 9.3*  HCT 27.1* 26.1* 26.0* 24.0* 27.1*  MCV 81.6 82.3  --   --  85.2  MCH  29.2 29.0  --   --  29.2  MCHC 35.8 35.2  --   --  34.3  RDW 18.5* 19.2*  --   --  20.0*  PLT 100* 113*  --   --  101*    Thyroid No results for input(s): TSH, FREET4 in the last 168 hours.  BNPNo results for input(s): BNP, PROBNP in the last 168 hours.  DDimer No results for input(s): DDIMER in the last 168 hours.   Radiology    CARDIAC CATHETERIZATION  Result Date: 11/07/2021 1.  Successful temporary pacemaker placement with threshold of 0.4 mA, rate of 60 bpm, and output of 15 mA; epicardial pacing was resumed at 80 bpm with the output of 15 mA.   DG Chest Port 1 View  Result Date: 11/07/2021 CLINICAL DATA:  Chest tube, sore chest, post AVR EXAM: PORTABLE CHEST 1 VIEW COMPARISON:  Portable exam 0510 hours compared to 11/06/2021 FINDINGS: Interval removal of endotracheal and nasogastric tubes. RIGHT jugular central venous catheter with tip projecting over confluence of the SVC. Mediastinal drains and LEFT thoracostomy tube present. Normal heart size post CABG and AVR. Bibasilar atelectasis and  small RIGHT pleural effusion. Remaining lungs clear. No pneumothorax. IMPRESSION: Bibasilar atelectasis and small RIGHT pleural effusion post cardiac surgery. Electronically Signed   By: Lavonia Dana M.D.   On: 11/07/2021 08:48   DG CHEST PORT 1 VIEW  Result Date: 11/06/2021 CLINICAL DATA:  Intubated, status post open heart surgery EXAM: PORTABLE CHEST 1 VIEW COMPARISON:  Chest radiograph 11/05/2021 FINDINGS: The endotracheal tube tip is approximately 3.9 cm from the carina. The right IJ vascular sheath terminates in the upper SVC. A left basilar chest tube and presumed right chest tube are stable. The enteric catheter again courses off the field of view. A tube is again seen terminating over the medial left hemithorax, likely reflecting a mediastinal drain as seen on the radiograph from 11/05/2021 at 11:56 a.m. Median sternotomy wires, mediastinal surgical clips, and an aortic valve prosthesis are again  seen. Lung volumes are low. Probable trace right pleural effusions are again seen with hazy opacity over the right lower lobe likely reflecting atelectasis. Overall, aeration is not significantly changed. There is no appreciable pneumothorax. The bones are stable. IMPRESSION: 1. Support devices as above. 2. Probable trace bilateral pleural effusions, not significantly changed compared to the most recent prior study. No appreciable pneumothorax. Electronically Signed   By: Valetta Mole M.D.   On: 11/06/2021 09:05   DG CHEST PORT 1 VIEW  Result Date: 11/05/2021 CLINICAL DATA:  Bleeding EXAM: PORTABLE CHEST 1 VIEW COMPARISON:  X-ray abdomen 11/05/2021. chest x-ray 118 23:12 p.m. FINDINGS: Right chest wall internal jugular central venous catheter with tip overlying the expected region of the proximal superior vena cava approximately 7 cm from the superior cavoatrial junction. Enteric tube coursing below the hemidiaphragm coursing below diaphragm with tip and side port collimated off view. Adjacent to on the left coursing cranially of unclear etiology. Query epicardial pacing wires noted along the right heart border. Left inferior approach chest tube is noted with tip overlying the region of the left costophrenic angle. Previously identified mediastinal drain not visualized. The heart and mediastinal contours are unchanged. Status post aortic valve repair. No focal consolidation. No pulmonary edema. Trace left pleural effusion. Possible trace right pleural effusion. No pneumothorax. No acute osseous abnormality. IMPRESSION: 1. Right chest wall internal jugular central venous catheter with tip overlying the expected region of the proximal superior vena cava approximately 7 cm from the superior cavoatrial junction. 2. Enteric tube coursing below the hemidiaphragm coursing below diaphragm with tip and side port collimated off view. Adjacent to on the left coursing cranially of unclear etiology. Consider x-ray abdomen  for further evaluation. 3. Trace left pleural effusion with inferior approach left chest tube noted. Electronically Signed   By: Iven Finn M.D.   On: 11/05/2021 18:55    Cardiac Studies   Echocardiogram (10/08/2021) IMPRESSIONS   The aortic valve was not well visualized. Aortic valve regurgitation is not visualized. Severe aortic valve stenosis. Aortic valve area, by VTI measures 0.90 cm. Aortic valve mean gradient measures 58.0 mmHg. Aortic valve Vmax measures 4.89 m/s. 2. Left ventricular ejection fraction, by estimation, is 60 to 65%. The left ventricle has normal function. The left ventricle has no regional wall motion abnormalities. There is mild concentric left ventricular hypertrophy. Left ventricular diastolic parameters are consistent with Grade I diastolic dysfunction (impaired relaxation). There is a small intracavitary < 20 mmHg with Valsalva. 3. Right ventricular systolic function is normal. The right ventricular size is normal. There is normal pulmonary artery systolic pressure. 4. Left atrial size was  mildly dilated. 5. The mitral valve is degenerative. No evidence of mitral valve regurgitation.   Comparison(s): EF 50%, AS mean gradient 28 mmHG, peak 46 mmHg. Interval increase to severe aortic stenosis.   Right/Left Heart Cath (11/03/2021)    Mid RCA lesion is 99% stenosed.   Prox RCA lesion is 60% stenosed.   Mid LM lesion is 40% stenosed.   Ost LAD to Prox LAD lesion is 70% stenosed.   Prox LAD to Mid LAD lesion is 40% stenosed.   Prox Cx to Mid Cx lesion is 40% stenosed.   Mild to moderate distal left main stenosis.  Severe, heavily calcified ostial LAD stenosis (RFR 0.78 suggesting the lesion is flow limiting).  Mild mid Circumflex stenosis Large dominant RCA with eccentric, nodular calcific lesion in the proximal vessel. This is a moderate stenosis. The mid RCA has a severe heavily calcified stenosis.  Normal right heart pressures Severe aortic stenosis  by echo (aortic valve not crossed today)   Recommendations: He has severe, heavily calcified CAD involving the ostial LAD and the mid RCA. PCI of the LAD lesion would be high risk. I have reviewed his films with our interventional and structural team and I think the best option is to consider CABG and AVR. Given recent syncope, will admit him to the telemetry unit and have CT surgery see him to review options for CABG with surgical AVR. He appears to be a good candidate for surgery    Patient Profile     86 y.o. male with history of severe aortic stenosis, HTN, HLD and PVCs who presented for planned cath as part of TAVR work-up found to have multivessel CAD now s/p 3v CABG with LIMA-LAD, SVG-PDA, SVG-OM and AVR with 78mm inspiris resilia AoV. Post-op course complicated by acute blood loss anemia requiring take back to OR for re-look on 11/05/21.   Assessment & Plan    #CHB: Patient with intermittent failure to capture from epicardial leads last night. Native rhythm was CHB with HR 20-30s. TVP placed today but ultimately he will need permanent PPM. EP following. -TVP placed this AM -Ultimately will need PPM -EP following  #Multivessel CAD s/p CABG/AVR: Patient s/p 3v CABG with LIMA-LAD, SVG-PDA, SVG-OM and AVR on 1/17 with post-op course complicated by acute blood loss anemia requiring re-exploration on 01/18. Received multiple pRBCs, FFP, cryo and plt. Also with CHB as detailed above s/p TVP on 11/07/21. Doing well this morning. Off pressors, currently v-paced.  -Post-op care per CV surgery -TVP in place as above -Off pressors -Continue ASA and statin -Will add GDMT as able   #Severe AS s/p AVR: S/p 3v CABG and AVR with 90mm Inspiris resilia prosthesis as detailed above. Post-op TEE on 11/04/21 with EF 60%, mild PVL. -Post-op care per CV surgery  #Acute Blood Loss Anemia: #Thrombocytopenia Required re-exploration on 11/05/21. Received multiple products. HgB stable this AM at  9.2>9.7>9.2. Plt improving 81>100>113. -S/p resuscitation with multiple units of pRBCs, FFP, plt and cryo -Trending HgB; currently stable -CT in place  #Anasarca: Anasarcic on exam and wt up in the setting of resuscitation with blood products.  -Diuresis per CV surgery; on lasix 40mg  IV BID and responding well  #HTN: Elevated today. -Diuresing as above  #HLD: -Continue lipitor 40mg  daily   For questions or updates, please contact [facility] Please consult [email or web address] for contact info under        Signed, Freada Bergeron, MD  11/07/2021, 1:11 PM

## 2021-11-07 NOTE — Progress Notes (Signed)
OT Cancellation Note  Patient Details Name: Joshua [name] MRN: [id number] DOB: August 20, 1936   Cancelled Treatment:    Reason Eval/Treat Not Completed: Medical issues which prohibited therapy. Pt's current pacing wires aren't pacing, needs to new ones.  [place], OTR/L Acute Rehab Services Pager [phone] Office [phone]    Almon Register 11/07/2021, 9:55 AM

## 2021-11-07 NOTE — Progress Notes (Addendum)
[address]       [place] [address]             [phone]      2 Days Post-Op Procedure(s) (LRB): EXPLORATION POST OPERATIVE OPEN HEARTFOR BLEEDING (N/A)  Subjective:  Patient had a rough night, didn't get any sleep. He would like a sleeping pill.  He denies pain, N/V.  No BM yet  + gas  Objective: Vital signs in last 24 hours: Temp:  [98.4 F (36.9 C)-99.5 F (37.5 C)] 98.4 F (36.9 C) (01/20 0300) Pulse Rate:  [79-93] 88 (01/20 0700) Cardiac Rhythm: A-V Sequential paced (01/20 0000) Resp:  [14-25] 22 (01/20 0700) BP: (109-161)/(60-93) 154/84 (01/20 0700) SpO2:  [89 %-96 %] 95 % (01/20 0700) Arterial Line BP: (118-145)/(50-60) 145/59 (01/19 1045) Weight:  [98.7 kg] 98.7 kg (01/20 0500)  Intake/Output from previous day: 01/19 0701 - 01/20 0700 In: 111.2 [I.V.:25.4; IV Piggyback:85.8] Out: 3955 [Urine:3295; Chest Tube:660]  General appearance: alert, cooperative, and no distress Heart: regular rate and rhythm Lungs: clear to auscultation bilaterally Abdomen: soft, mild distention, + BS Extremities: bilateral UE/LE Edema, improving Wound: clean and dry EVH site, ecchymosis present.Marland Kitchen aquacel on sternotomy  Lab Results: Recent Labs    11/06/21 0312 11/06/21 0716 11/06/21 0925 11/07/21 0518  WBC 7.8  --   --  9.5  HGB 9.2*   < > 8.2* 9.3*  HCT 26.1*   < > 24.0* 27.1*  PLT 113*  --   --  101*   < > = values in this interval not displayed.   BMET:  Recent Labs    11/06/21 0312 11/06/21 0716 11/06/21 0925 11/07/21 0518  NA 141   < > 141 138  K 3.7   < > 3.6 3.7  CL 107  --   --  105  CO2 26  --   --  26  GLUCOSE 136*  --   --  97  BUN 12  --   --  16  CREATININE 1.38*  --   --  1.43*  CALCIUM 7.1*  --   --  6.8*   < > = values in this interval not displayed.    PT/INR:  Recent Labs    11/05/21 1649  LABPROT 16.4*  INR 1.3*   ABG    Component Value Date/Time   PHART 7.490 (H) 11/06/2021 0925   HCO3 23.5 11/06/2021  0925   TCO2 24 11/06/2021 0925   ACIDBASEDEF 6.0 (H) 11/05/2021 1032   O2SAT 94.0 11/06/2021 0925   CBG (last 3)  Recent Labs    11/06/21 2011 11/06/21 2350 11/07/21 0428  GLUCAP 111* 104* 100*    Assessment/Plan: S/P Procedure(s) (LRB): EXPLORATION POST OPERATIVE OPEN HEARTFOR BLEEDING (N/A)  CV- Sinus Bradycardia with 2nd degree AV Block- remains paced, no BB at this time, mild Hypertension will see how it is after diuretics, will monitor with slight elevation in creatinine, can start antihypertensive if needed in a few days Pulm- CT output is blood tinge serous, 660 output yesterday, CXR is w/o effusion, weaning oxygen as tolerated, CT to remain in place for now Renal- creatinine is slightly increased at 1.43, combination of blood loss and now diuretic usage due to volume overloaded state, K is at 3.7, continue lasix, potassium today... foley to remain in place for accurate I/O monitoring Expected post operative blood loss anemia, stable hgb at 9.3 CBGs controlled, not a diabetic d/c SSIP, cbgs Dispo- patient doing  better, he is in 2nd degree AV block under pacer.. no BB at this time, mild hypertension will monitor since being aggressively diuresed, CT output at 660 yesterday will leave in place today, watch creatinine, needs diuresis IV lasix ordered today, Hgb stable   LOS: 4 days    Ellwood Handler, PA-C 11/07/2021  Agree with above Sinus brady in 30s.  Will consult EP today. Will keep pacing wires Will keep Cts Continue diuresis  Continue ICU care  [place]

## 2021-11-08 ENCOUNTER — Inpatient Hospital Stay (HOSPITAL_COMMUNITY): Payer: Medicare HMO

## 2021-11-08 LAB — BASIC METABOLIC PANEL
Anion gap: 7 (ref 5–15)
BUN: 18 mg/dL (ref 8–23)
CO2: 24 mmol/L (ref 22–32)
Calcium: 7.8 mg/dL — ABNORMAL LOW (ref 8.9–10.3)
Chloride: 103 mmol/L (ref 98–111)
Creatinine, Ser: 1.34 mg/dL — ABNORMAL HIGH (ref 0.61–1.24)
GFR, Estimated: 53 mL/min — ABNORMAL LOW (ref >60)
Glucose, Bld: 97 mg/dL (ref 70–99)
Potassium: 3.6 mmol/L (ref 3.5–5.1)
Sodium: 134 mmol/L — ABNORMAL LOW (ref 135–145)

## 2021-11-08 LAB — CBC
HCT: 28.4 % — ABNORMAL LOW (ref 39.0–52.0)
Hemoglobin: 9.7 g/dL — ABNORMAL LOW (ref 13.0–17.0)
MCH: 29.6 pg (ref 26.0–34.0)
MCHC: 34.2 g/dL (ref 30.0–36.0)
MCV: 86.6 fL (ref 80.0–100.0)
Platelets: 99 10*3/uL — ABNORMAL LOW (ref 150–400)
RBC: 3.28 MIL/uL — ABNORMAL LOW (ref 4.22–5.81)
RDW: 19.6 % — ABNORMAL HIGH (ref 11.5–15.5)
WBC: 10 10*3/uL (ref 4.0–10.5)
nRBC: 0 % (ref 0.0–0.2)

## 2021-11-08 MED ORDER — SODIUM CHLORIDE 0.9 % IV SOLN
250.0000 mL | INTRAVENOUS | Status: DC
  Administered 2021-11-09: 250 mL via INTRAVENOUS

## 2021-11-08 MED ORDER — CEFAZOLIN SODIUM-DEXTROSE 2-4 GM/100ML-% IV SOLN
2.0000 g | INTRAVENOUS | Status: AC
  Administered 2021-11-09: 2 g via INTRAVENOUS

## 2021-11-08 MED ORDER — SODIUM CHLORIDE 0.9 % IV SOLN
80.0000 mg | INTRAVENOUS | Status: AC
  Administered 2021-11-09: 80 mg

## 2021-11-08 MED ORDER — SODIUM CHLORIDE 0.9% FLUSH: 3.0000 mL | INTRAVENOUS | Status: DC | PRN

## 2021-11-08 MED ORDER — CHLORHEXIDINE GLUCONATE 4 % EX LIQD
60.0000 mL | Freq: Once | CUTANEOUS | Status: AC
  Administered 2021-11-09: 4 via TOPICAL
  Filled 2021-11-08 (×2): qty 60

## 2021-11-08 MED ORDER — CHLORHEXIDINE GLUCONATE 4 % EX LIQD
60.0000 mL | Freq: Once | CUTANEOUS | Status: AC
  Administered 2021-11-08: 4 via TOPICAL
  Filled 2021-11-08: qty 60

## 2021-11-08 MED ORDER — SODIUM CHLORIDE 0.9 % IV SOLN: INTRAVENOUS | Status: DC

## 2021-11-08 NOTE — Progress Notes (Signed)
PT Cancellation Note  Patient Details Name: Joshua [name] MRN: [id number] DOB: [birth year]   Cancelled Treatment:    Reason Eval/Treat Not Completed: Patient not medically ready. Patient with transvenous pacemaker, to remain in bed until PPM tomorrow. Will follow-up for PT Evaluation on Monday.  Mabeline Caras, PT, DPT Acute Rehabilitation Services  Pager [phone] Office [place] 11/08/2021, 7:49 AM

## 2021-11-08 NOTE — Progress Notes (Signed)
Progress Note  Patient Name: Joshua [name] Date of Encounter: 11/08/2021  [facility] Cardiologist: None  Subjective   NAEO. TVP yesterday with good capture threshold.  Inpatient Medications    Scheduled Meds:  acetaminophen  1,000 mg Oral Q6H   Or   acetaminophen (TYLENOL) oral liquid 160 mg/5 mL  1,000 mg Per Tube Q6H   aspirin EC  325 mg Oral Daily   Or   aspirin  324 mg Per Tube Daily   atorvastatin  40 mg Per Tube Daily   bisacodyl  10 mg Oral Daily   Or   bisacodyl  10 mg Rectal Daily   Chlorhexidine Gluconate Cloth  6 each Topical Daily   docusate sodium  200 mg Oral Daily   mouth rinse  15 mL Mouth Rinse BID   pantoprazole  40 mg Oral Daily   potassium chloride  20 mEq Oral Daily   sodium chloride flush  3 mL Intravenous Q12H   sodium chloride flush  3 mL Intravenous Q12H   sodium chloride flush  3 mL Intravenous Q12H   Continuous Infusions:  sodium chloride 10 mL/hr at 11/06/21 0600   sodium chloride     sodium chloride 10 mL/hr at 11/07/21 2000   lactated ringers Stopped (11/06/21 0752)   PRN Meds: sodium chloride, metoprolol tartrate, midazolam, morphine injection, ondansetron (ZOFRAN) IV, oxyCODONE, sodium chloride flush, traMADol, zolpidem   Vital Signs    Vitals:   11/08/21 0500 11/08/21 0530 11/08/21 0700 11/08/21 0727  BP: (!) 159/80  138/75   Pulse: 80  80   Resp:      Temp:    99 F (37.2 C)  TempSrc:    Oral  SpO2: 95%  96%   Weight:  93.4 kg    Height:        Intake/Output Summary (Last 24 hours) at 11/08/2021 0729 Last data filed at 11/08/2021 0530 Gross per 24 hour  Intake 299.9 ml  Output 4910 ml  Net -4610.1 ml   Last 3 Weights 11/08/2021 11/07/2021 11/06/2021  Weight (lbs) 205 lb 14.6 oz 217 lb 9.5 oz 228 lb 13.4 oz  Weight (kg) 93.4 kg 98.7 kg 103.8 kg      Telemetry    paced - Personally Reviewed  ECG    Personally Reviewed  Physical Exam   GEN: No acute distress.   Neck: No JVD. R IJ TVP. Cardiac: RRR, no  murmurs, rubs, or gallops.  Respiratory: Clear to auscultation bilaterally. GI: Soft, nontender, non-distended  MS: No edema; No deformity. Neuro:  Nonfocal  Psych: Normal affect   Labs    High Sensitivity Troponin:  No results for input(s): TROPONINIHS in the last 720 hours.   Chemistry Recent Labs  Lab 11/03/21 2003 11/04/21 0428 11/05/21 1047 11/05/21 1511 11/05/21 1649 11/06/21 0312 11/06/21 0716 11/06/21 0925 11/07/21 0518 11/08/21 0342  NA 134*   < > 144   < > 140 141   < > 141 138 134*  K 4.0   < > 3.7   < > 3.9 3.7   < > 3.6 3.7 3.6  CL 101   < > 103  --  103 107  --   --  105 103  CO2 21*   < > 22  --  24 26  --   --  26 24  GLUCOSE 149*   < > 177*  --  134* 136*  --   --  97 97  BUN 12   < >  10  --  11 12  --   --  16 18  CREATININE 0.95   < > 1.25*  --  1.30* 1.38*  --   --  1.43* 1.34*  CALCIUM 8.9   < > 7.4*  --  7.3* 7.1*  --   --  6.8* 7.8*  MG  --    < > 2.4  --  2.1 2.2  --   --   --   --   PROT 6.3*  --   --   --   --   --   --   --   --   --   ALBUMIN 3.5  --   --   --   --   --   --   --   --   --   AST 36  --   --   --   --   --   --   --   --   --   ALT 24  --   --   --   --   --   --   --   --   --   ALKPHOS 55  --   --   --   --   --   --   --   --   --   BILITOT 1.7*  --   --   --   --   --   --   --   --   --   GFRNONAA >60   < > 57*  --  55* 51*  --   --  49* 53*  ANIONGAP 12   < > 19*  --  13 8  --   --  7 7   < > = values in this interval not displayed.    Lipids No results for input(s): CHOL, TRIG, HDL, LABVLDL, LDLCALC, CHOLHDL in the last 168 hours.  Hematology Recent Labs  Lab 11/06/21 0312 11/06/21 0716 11/06/21 0925 11/07/21 0518 11/08/21 0342  WBC 7.8  --   --  9.5 10.0  RBC 3.17*  --   --  3.18* 3.28*  HGB 9.2*   < > 8.2* 9.3* 9.7*  HCT 26.1*   < > 24.0* 27.1* 28.4*  MCV 82.3  --   --  85.2 86.6  MCH 29.0  --   --  29.2 29.6  MCHC 35.2  --   --  34.3 34.2  RDW 19.2*  --   --  20.0* 19.6*  PLT 113*  --   --  101* 99*   <  > = values in this interval not displayed.   Thyroid No results for input(s): TSH, FREET4 in the last 168 hours.  BNPNo results for input(s): BNP, PROBNP in the last 168 hours.  DDimer No results for input(s): DDIMER in the last 168 hours.   Radiology    CARDIAC CATHETERIZATION  Result Date: 11/07/2021 1.  Successful temporary pacemaker placement with threshold of 0.4 mA, rate of 60 bpm, and output of 15 mA; epicardial pacing was resumed at 80 bpm with the output of 15 mA.   DG CHEST PORT 1 VIEW  Result Date: 11/07/2021 CLINICAL DATA:  Status post coronary bypass surgery and aortic valve replacement EXAM: PORTABLE CHEST 1 VIEW COMPARISON:  Previous examinations including the study done earlier today. FINDINGS: There is interval placement of right IJ central venous catheter with its tip in the course of superior  vena cava. There is interval removal of vascular sheath from right IJ. Metallic sutures are seen in the sternum. Skin staples are noted. No interval changes are noted in the left chest tube and mediastinal drains. There are linear densities in both lower lung fields suggesting subsegmental atelectasis with interval improvement. There are no signs of alveolar pulmonary edema or new focal infiltrates. There is no significant pleural effusion or pneumothorax. IMPRESSION: Tip of right IJ central venous catheter is seen in the course of superior vena cava. There is interval improvement in aeration of lower lung fields suggesting decrease in subsegmental atelectasis. Electronically Signed   By: Elmer Picker M.D.   On: 11/07/2021 13:47   DG Chest Port 1 View  Result Date: 11/07/2021 CLINICAL DATA:  Chest tube, sore chest, post AVR EXAM: PORTABLE CHEST 1 VIEW COMPARISON:  Portable exam 0510 hours compared to 11/06/2021 FINDINGS: Interval removal of endotracheal and nasogastric tubes. RIGHT jugular central venous catheter with tip projecting over confluence of the SVC. Mediastinal drains and  LEFT thoracostomy tube present. Normal heart size post CABG and AVR. Bibasilar atelectasis and small RIGHT pleural effusion. Remaining lungs clear. No pneumothorax. IMPRESSION: Bibasilar atelectasis and small RIGHT pleural effusion post cardiac surgery. Electronically Signed   By: Lavonia Dana M.D.   On: 11/07/2021 08:48    Cardiac Studies     Patient Profile     86 y.o. male with CAD and severe AS s/p CABG and AVR on 1/17/12023 who has developed complete heart block requiring pacing support. Permanent pacing will be required.  Assessment & Plan    #Complete heart block Temporary pacer in place with capture threshold <77mA. I have the epicardial wires providing primary pacing support for now with the TVP as backup. Plan for dual chamber permanent pacemaker with left bundle area lead. Plan for implant on Sunday morning. - keep NPO after MN    #CAD s/p CABG -management per primary  For questions or updates, please contact [facility] Please consult [email or web address] for contact info under        Signed, Vickie Epley, MD  11/08/2021, 7:29 AM

## 2021-11-08 NOTE — Progress Notes (Signed)
° °   °  [address]       [place] [address]             [phone]      Eating dinner No new issues  BP 136/89    Pulse 84    Temp 98.3 F (36.8 C) (Oral)    Resp (!) 24    Ht 5' 11.5" (1.816 m)    Wt 93.4 kg    SpO2 95%    BMI 28.32 kg/m   Intake/Output Summary (Last 24 hours) at 11/08/2021 1706 Last data filed at 11/08/2021 1300 Gross per 24 hour  Intake 909.9 ml  Output 2835 ml  Net -1925.1 ml   Continue present care For PPM tomorrow AM  Revonda Standard. Roxan Hockey, MD [facility] [phone]

## 2021-11-08 NOTE — Progress Notes (Signed)
1 Day Post-Op Procedure(s) (LRB): TEMPORARY PACEMAKER (N/A) Subjective: No complaints this morning  Objective: Vital signs in last 24 hours: Temp:  [98.1 F (36.7 C)-99 F (37.2 C)] 99 F (37.2 C) (01/21 0727) Pulse Rate:  [53-130] 80 (01/21 0700) Cardiac Rhythm: A-V Sequential paced (01/21 0400) Resp:  [0-48] 24 (01/20 1900) BP: (109-164)/(65-98) 138/75 (01/21 0700) SpO2:  [0 %-97 %] 96 % (01/21 0700) Weight:  [93.4 kg] 93.4 kg (01/21 0530)  Hemodynamic parameters for last 24 hours:    Intake/Output from previous day: 01/20 0701 - 01/21 0700 In: 299.9 [P.O.:240; I.V.:59.9] Out: 4910 [Urine:4550; Chest Tube:360] Intake/Output this shift: No intake/output data recorded.  General appearance: alert, cooperative, and no distress Neurologic: intact Heart: regular rate and rhythm Lungs: diminished breath sounds bibasilar Abdomen: normal findings: soft, non-tender  Lab Results: Recent Labs    11/07/21 0518 11/08/21 0342  WBC 9.5 10.0  HGB 9.3* 9.7*  HCT 27.1* 28.4*  PLT 101* 99*   BMET:  Recent Labs    11/07/21 0518 11/08/21 0342  NA 138 134*  K 3.7 3.6  CL 105 103  CO2 26 24  GLUCOSE 97 97  BUN 16 18  CREATININE 1.43* 1.34*  CALCIUM 6.8* 7.8*    PT/INR:  Recent Labs    11/05/21 1649  LABPROT 16.4*  INR 1.3*   ABG    Component Value Date/Time   PHART 7.490 (H) 11/06/2021 0925   HCO3 23.5 11/06/2021 0925   TCO2 24 11/06/2021 0925   ACIDBASEDEF 6.0 (H) 11/05/2021 1032   O2SAT 94.0 11/06/2021 0925   CBG (last 3)  Recent Labs    11/06/21 2011 11/06/21 2350 11/07/21 0428  GLUCAP 111* 104* 100*    Assessment/Plan: S/P Procedure(s) (LRB): TEMPORARY PACEMAKER (N/A) Overall doing well CHB pacer dependent. Has temporary wire in place. For PPM tomorrow Minimal output from tubes overnight- will dc Tolerating diet Bedrest until permanent pacemaker placed Thrombocytopenia stable Creatinine down slightly to 1.34   LOS: 5 days    Joshua [name] 11/08/2021

## 2021-11-08 NOTE — Progress Notes (Signed)
OT Cancellation Note  Patient Details Name: Joshua [name] MRN: [id number] DOB: 03/18/1936   Cancelled Treatment:    Reason Eval/Treat Not Completed: Patient not medically ready. Patient with transvenous pacemaker, to remain in bed until PPM tomorrow. Will follow-up for OT Evaluation on Monday per RN advice.  Taiwo Fish D Tanyah Debruyne 11/08/2021, 7:54 AM

## 2021-11-09 ENCOUNTER — Inpatient Hospital Stay (HOSPITAL_COMMUNITY)
Admission: AD | Disposition: A | Discharge status: Skilled Nursing Facility | Payer: Self-pay | Source: Home / Self Care | Attending: Thoracic Surgery (Cardiothoracic Vascular Surgery)

## 2021-11-09 HISTORY — PX: PACEMAKER IMPLANT: EP1218

## 2021-11-09 LAB — CBC
HCT: 30.7 % — ABNORMAL LOW (ref 39.0–52.0)
Hemoglobin: 10.3 g/dL — ABNORMAL LOW (ref 13.0–17.0)
MCH: 29.5 pg (ref 26.0–34.0)
MCHC: 33.6 g/dL (ref 30.0–36.0)
MCV: 88 fL (ref 80.0–100.0)
Platelets: 102 10*3/uL — ABNORMAL LOW (ref 150–400)
RBC: 3.49 MIL/uL — ABNORMAL LOW (ref 4.22–5.81)
RDW: 19 % — ABNORMAL HIGH (ref 11.5–15.5)
WBC: 8.4 10*3/uL (ref 4.0–10.5)
nRBC: 0 % (ref 0.0–0.2)

## 2021-11-09 LAB — BASIC METABOLIC PANEL
Anion gap: 10 (ref 5–15)
BUN: 16 mg/dL (ref 8–23)
CO2: 22 mmol/L (ref 22–32)
Calcium: 8.3 mg/dL — ABNORMAL LOW (ref 8.9–10.3)
Chloride: 102 mmol/L (ref 98–111)
Creatinine, Ser: 1.09 mg/dL (ref 0.61–1.24)
GFR, Estimated: >60 mL/min (ref >60)
Glucose, Bld: 98 mg/dL (ref 70–99)
Potassium: 4 mmol/L (ref 3.5–5.1)
Sodium: 134 mmol/L — ABNORMAL LOW (ref 135–145)

## 2021-11-09 SURGERY — PACEMAKER IMPLANT

## 2021-11-09 MED ORDER — SODIUM CHLORIDE 0.9 % IV SOLN
INTRAVENOUS | Status: AC
  Filled 2021-11-09: qty 2

## 2021-11-09 MED ORDER — HEPARIN (PORCINE) IN NACL 1000-0.9 UT/500ML-% IV SOLN
INTRAVENOUS | Status: AC
  Filled 2021-11-09: qty 500

## 2021-11-09 MED ORDER — MIDAZOLAM HCL 5 MG/5ML IJ SOLN
INTRAMUSCULAR | Status: AC
  Filled 2021-11-09: qty 5

## 2021-11-09 MED ORDER — CEFAZOLIN SODIUM-DEXTROSE 2-4 GM/100ML-% IV SOLN
INTRAVENOUS | Status: AC
  Filled 2021-11-09: qty 100

## 2021-11-09 MED ORDER — LIDOCAINE HCL (PF) 1 % IJ SOLN
INTRAMUSCULAR | Status: AC
  Filled 2021-11-09: qty 60

## 2021-11-09 MED ORDER — FENTANYL CITRATE (PF) 100 MCG/2ML IJ SOLN
INTRAMUSCULAR | Status: DC | PRN
  Administered 2021-11-09: 25 ug via INTRAVENOUS

## 2021-11-09 MED ORDER — FENTANYL CITRATE (PF) 100 MCG/2ML IJ SOLN
INTRAMUSCULAR | Status: AC
  Filled 2021-11-09: qty 2

## 2021-11-09 MED ORDER — MIDAZOLAM HCL 5 MG/5ML IJ SOLN
INTRAMUSCULAR | Status: DC | PRN
  Administered 2021-11-09: 1 mg via INTRAVENOUS

## 2021-11-09 MED ORDER — LISINOPRIL 20 MG PO TABS
20.0000 mg | ORAL_TABLET | Freq: Every day | ORAL | Status: DC
  Administered 2021-11-09 – 2021-11-13 (×5): 20 mg via ORAL
  Filled 2021-11-09 (×5): qty 1

## 2021-11-09 MED ORDER — LIDOCAINE HCL (PF) 1 % IJ SOLN
INTRAMUSCULAR | Status: DC | PRN
  Administered 2021-11-09: 60 mL

## 2021-11-09 MED ORDER — HEPARIN (PORCINE) IN NACL 1000-0.9 UT/500ML-% IV SOLN
INTRAVENOUS | Status: DC | PRN
  Administered 2021-11-09: 500 mL

## 2021-11-09 MED ORDER — ACETAMINOPHEN 325 MG PO TABS
325.0000 mg | ORAL_TABLET | ORAL | Status: DC | PRN
  Administered 2021-11-11 – 2021-11-12 (×2): 650 mg via ORAL
  Filled 2021-11-09 (×2): qty 2

## 2021-11-09 SURGICAL SUPPLY — 13 items
CABLE SURGICAL S-101-97-12 (CABLE) ×2 IMPLANT
CATH RIGHTSITE C315HIS02 (CATHETERS) ×1 IMPLANT
IPG PACE AZUR XT DR MRI W1DR01 (Pacemaker) IMPLANT
LEAD CAPSURE NOVUS 5076-52CM (Lead) ×1 IMPLANT
LEAD CAPSURE NOVUS 5076-58CM (Lead) ×1 IMPLANT
PACE AZURE XT DR MRI W1DR01 (Pacemaker) ×2 IMPLANT
PAD DEFIB RADIO PHYSIO CONN (PAD) ×2 IMPLANT
POUCH AIGIS-R ANTIBACT PPM (Mesh General) ×2 IMPLANT
POUCH AIGIS-R ANTIBACT PPM MED (Mesh General) IMPLANT
SHEATH 7FR PRELUDE SNAP 13 (SHEATH) ×1 IMPLANT
SHEATH 9FR PRELUDE SNAP 13 (SHEATH) ×1 IMPLANT
TRAY PACEMAKER INSERTION (PACKS) ×2 IMPLANT
WIRE HI TORQ VERSACORE-J 145CM (WIRE) ×1 IMPLANT

## 2021-11-09 NOTE — Progress Notes (Signed)
Electrophysiology Rounding Note  Patient Name: Joshua [name] Date of Encounter: 11/10/21   Primary Cardiologist: None Electrophysiologist: Vickie Epley, MD   Subjective   Pt with worsening SOB overnight and now s/p chest tube x 2 for bilateral pneumothoraces  Inpatient Medications    Scheduled Meds:  acetaminophen  1,000 mg Oral Q6H   Or   acetaminophen (TYLENOL) oral liquid 160 mg/5 mL  1,000 mg Per Tube Q6H   aspirin EC  325 mg Oral Daily   Or   aspirin  324 mg Per Tube Daily   atorvastatin  40 mg Per Tube Daily   bisacodyl  10 mg Oral Daily   Or   bisacodyl  10 mg Rectal Daily   Chlorhexidine Gluconate Cloth  6 each Topical Daily   docusate sodium  200 mg Oral Daily   lisinopril  20 mg Oral Daily   mouth rinse  15 mL Mouth Rinse BID   pantoprazole  40 mg Oral Daily   potassium chloride  20 mEq Oral Daily   sodium chloride flush  3 mL Intravenous Q12H   Continuous Infusions:  PRN Meds: acetaminophen, morphine injection, ondansetron (ZOFRAN) IV, oxyCODONE, sodium chloride flush, traMADol, zolpidem   Vital Signs    Vitals:   11/09/21 1600 11/09/21 1700 11/09/21 1800 11/09/21 2000  BP: (!) 173/90 (!) 165/84 (!) 176/88 (!) 128/107  Pulse: 84 91 96 90  Resp:      Temp:    97.9 F (36.6 C)  TempSrc:    Oral  SpO2: 94% 94% 92% 93%  Weight:      Height:        Intake/Output Summary (Last 24 hours) at 11/09/2021 2122 Last data filed at 11/09/2021 1600 Gross per 24 hour  Intake 450.95 ml  Output 2800 ml  Net -2349.05 ml   Filed Weights   11/06/21 0600 11/07/21 0500 11/08/21 0530  Weight: 103.8 kg 98.7 kg 93.4 kg    Physical Exam    GEN- The patient is well appearing, alert and oriented x 3 today.   Head- normocephalic, atraumatic Eyes-  Sclera clear, conjunctiva pink Ears- hearing intact Oropharynx- clear Neck- supple Lungs- Clear to ausculation bilaterally, normal work of breathing Heart- Regular rate and rhythm, no murmurs, rubs or  gallops GI- soft, NT, ND, + BS Extremities- no clubbing or cyanosis. No edema Skin- no rash or lesion Psych- euthymic mood, full affect Neuro- strength and sensation are intact  Labs    CBC Recent Labs    11/08/21 0342 11/09/21 0557  WBC 10.0 8.4  HGB 9.7* 10.3*  HCT 28.4* 30.7*  MCV 86.6 88.0  PLT 99* 469*   Basic Metabolic Panel Recent Labs    11/08/21 0342 11/09/21 0557  NA 134* 134*  K 3.6 4.0  CL 103 102  CO2 24 22  GLUCOSE 97 98  BUN 18 16  CREATININE 1.34* 1.09  CALCIUM 7.8* 8.3*   Liver Function Tests No results for input(s): AST, ALT, ALKPHOS, BILITOT, PROT, ALBUMIN in the last 72 hours. No results for input(s): LIPASE, AMYLASE in the last 72 hours. Cardiac Enzymes No results for input(s): CKTOTAL, CKMB, CKMBINDEX, TROPONINI in the last 72 hours.   Telemetry    V pacing 80-90s (personally reviewed)  Radiology    EP PPM/ICD IMPLANT  Result Date: 11/09/2021  CONCLUSIONS:  1. Symptomatic complete heart block  2. Successful dual chamber permanent pacemaker implantation  3.  No early apparent complications.   DG CHEST PORT  1 VIEW  Result Date: 11/08/2021 CLINICAL DATA:  Chest pain and shortness of breath. Status post CABG procedure. EXAM: PORTABLE CHEST 1 VIEW COMPARISON:  11/07/2021 FINDINGS: Status post CABG procedure right internal jugular pacer lead is identified terminating in the right ventricle. Mediastinal drain and left chest tube are stable in position. No pneumothorax visualized. Persistent small right pleural effusion. No interstitial edema or airspace consolidation. IMPRESSION: 1. Stable chest tube and mediastinal drain. No pneumothorax visualized. 2. Persistent small right pleural effusion. Electronically Signed   By: Kerby Moors M.D.   On: 11/08/2021 10:07    Patient Profile     86 y.o. male with CAD and severe AS s/p CABG and AVR on 1/17/12023 who has developed complete heart block requiring pacing support. Permanent pacing will be  required.    Assessment & Plan    Complete heart block S/p MDT DDD PPM 1/22 CXR (1 view) with stable appearing leads Wound care and arm restrictions to be reviewed. Usual follow up in place.  2. CAD s/p CABG - management per primary  3. Pnuemothoraces Appreciate TCTS management. Now s/p bilateral chest tubes.   For questions or updates, please contact [place] Please consult [email or web address] for contact info under Cardiology/STEMI.  Signed, Shirley Friar, PA-C  11/09/2021, 9:22 PM

## 2021-11-09 NOTE — Progress Notes (Signed)
2 Days Post-Op Procedure(s) (LRB): TEMPORARY PACEMAKER (N/A) Subjective: No complaints this AM  Objective: Vital signs in last 24 hours: Temp:  [98.2 F (36.8 C)-98.5 F (36.9 C)] 98.2 F (36.8 C) (01/22 0722) Pulse Rate:  [77-92] 84 (01/22 0600) Cardiac Rhythm: A-V Sequential paced (01/22 0000) BP: (118-185)/(73-126) 163/123 (01/22 0600) SpO2:  [91 %-97 %] 94 % (01/22 0600)  Hemodynamic parameters for last 24 hours:    Intake/Output from previous day: 01/21 0701 - 01/22 0700 In: 1218.1 [P.O.:870; I.V.:348.1] Out: 2725 [Urine:2635; Chest Tube:90] Intake/Output this shift: No intake/output data recorded.  General appearance: alert, cooperative, and no distress Neurologic: intact Heart: regular rate and rhythm Lungs: diminished breath sounds bibasilar  Lab Results: Recent Labs    11/08/21 0342 11/09/21 0557  WBC 10.0 8.4  HGB 9.7* 10.3*  HCT 28.4* 30.7*  PLT 99* 102*   BMET:  Recent Labs    11/08/21 0342 11/09/21 0557  NA 134* 134*  K 3.6 4.0  CL 103 102  CO2 24 22  GLUCOSE 97 98  BUN 18 16  CREATININE 1.34* 1.09  CALCIUM 7.8* 8.3*    PT/INR: No results for input(s): LABPROT, INR in the last 72 hours. ABG    Component Value Date/Time   PHART 7.490 (H) 11/06/2021 0925   HCO3 23.5 11/06/2021 0925   TCO2 24 11/06/2021 0925   ACIDBASEDEF 6.0 (H) 11/05/2021 1032   O2SAT 94.0 11/06/2021 0925   CBG (last 3)  Recent Labs    11/06/21 2011 11/06/21 2350 11/07/21 0428  GLUCAP 111* 104* 100*    Assessment/Plan: S/P Procedure(s) (LRB): TEMPORARY PACEMAKER (N/A) -POD # 5/4 NEURO- intact CV- still pacer dependent  For PPM this AM RESP- Continue IS RENAL- creatinine back to normal ENDO_ CBG well controlled GI- tolerating POs, currently NPO for pacemaker Anemia sec to ABL, mild, stable Thrombocytopenia stable Mobilize after PPM placed  LOS: 6 days    Melrose Nakayama 11/09/2021

## 2021-11-09 NOTE — Discharge Instructions (Addendum)
We ask the SNF to please do the following: 1. Please obtain vital signs at least one time daily 2.Please weigh the patient daily. If he or she continues to gain weight or develops lower extremity edema, contact the office at [phone]. 3. Ambulate patient at least three times daily and please use sternal precautions.    Discharge Instructions:  1. You may shower, please wash incisions daily with soap and water and keep dry.  If you wish to cover wounds with dressing you may do so but please keep clean and change daily.  No tub baths or swimming until incisions have completely healed.  If your incisions become red or develop any drainage please call our office at [phone]  2. No Driving until cleared by [place] office and you are no longer using narcotic pain medications  3. Monitor your weight daily.. Please use the same scale and weigh at same time... If you gain 5-10 lbs in 48 hours with associated lower extremity swelling, please contact our office at [phone]  4. Fever of 101.5 for at least 24 hours with no source, please contact our office at [phone]  5. Activity- up as tolerated, please walk at least 3 times per day.  Avoid strenuous activity, no lifting, pushing, or pulling with your arms over 8-10 lbs for a minimum of 6 weeks  6. If any questions or concerns arise, please do not hesitate to contact our office at [phone]    After Your Pacemaker   You have a [employer] Pacemaker  ACTIVITY Do not lift your arm above shoulder height for 1 week after your procedure. After 7 days, you may progress as below.  You should remove your sling 24 hours after your procedure, unless otherwise instructed by your provider.     Sunday November 16, 2021  Monday November 17, 2021 Tuesday November 18, 2021 Wednesday November 19, 2021   Do not lift, push, pull, or carry anything over 10 pounds with the affected arm until 6 weeks (Sunday December 21, 2021 ) after your procedure.    You may drive AFTER your wound check, unless you have been told otherwise by your provider.   Ask your healthcare provider when you can go back to work   INCISION/Dressing If you are on a blood thinner such as Coumadin, Xarelto, Eliquis, Plavix, or Pradaxa please confirm with your provider when this should be resumed.   If large square, outer bandage is left in place, this can be removed after 24 hours from your procedure. Do not remove steri-strips or glue as below.   Monitor your Pacemaker site for redness, swelling, and drainage. Call the device clinic at [phone] if you experience these symptoms or fever/chills.  If your incision is sealed with Steri-strips or staples, you may shower 10 days after your procedure or when told by your provider. Do not remove the steri-strips or let the shower hit directly on your site. You may wash around your site with soap and water.    If you were discharged in a sling, please do not wear this during the day more than 48 hours after your surgery unless otherwise instructed. This may increase the risk of stiffness and soreness in your shoulder.   Avoid lotions, ointments, or perfumes over your incision until it is well-healed.  You may use a hot tub or a pool AFTER your wound check appointment if the incision is completely closed.  Pacemaker Alerts:  Some alerts are vibratory and others beep. These  are NOT emergencies. Please call our office to let us know. If this occurs at night or on weekends, it can wait until the next business day. Send a remote transmission.  If your device is capable of reading fluid status (for heart failure), you will be offered monthly monitoring to review this with you.   DEVICE MANAGEMENT Remote monitoring is used to monitor your pacemaker from home. This monitoring is scheduled every 91 days by our office. It allows [place] to keep an eye on the functioning of your device to ensure it is working properly. You will  routinely see your Electrophysiologist annually (more often if necessary).   You should receive your ID card for your new device in 4-8 weeks. Keep this card with you at all times once received. Consider wearing a medical alert bracelet or necklace.  Your Pacemaker may be MRI compatible. This will be discussed at your next office visit/wound check.  You should avoid contact with strong electric or magnetic fields.   Do not use amateur (ham) radio equipment or electric (arc) welding torches. MP3 player headphones with magnets should not be used. Some devices are safe to use if held at least 12 inches (30 cm) from your Pacemaker. These include power tools, lawn mowers, and speakers. If you are unsure if something is safe to use, ask your health care provider.  When using your cell phone, hold it to the ear that is on the opposite side from the Pacemaker. Do not leave your cell phone in a pocket over the Pacemaker.  You may safely use electric blankets, heating pads, computers, and microwave ovens.  Call the office right away if: You have chest pain. You feel more short of breath than you have felt before. You feel more light-headed than you have felt before. Your incision starts to open up.  This information is not intended to replace advice given to you by your health care provider. Make sure you discuss any questions you have with your health care provider.

## 2021-11-09 NOTE — Progress Notes (Signed)
Progress Note  Patient Name: Joshua [name] Date of Encounter: 11/09/2021  [facility] Cardiologist: None  Subjective   NAEO. For PPM this morning.  Inpatient Medications    Scheduled Meds:  acetaminophen  1,000 mg Oral Q6H   Or   acetaminophen (TYLENOL) oral liquid 160 mg/5 mL  1,000 mg Per Tube Q6H   aspirin EC  325 mg Oral Daily   Or   aspirin  324 mg Per Tube Daily   atorvastatin  40 mg Per Tube Daily   bisacodyl  10 mg Oral Daily   Or   bisacodyl  10 mg Rectal Daily   chlorhexidine  60 mL Topical Once   Chlorhexidine Gluconate Cloth  6 each Topical Daily   docusate sodium  200 mg Oral Daily   gentamicin irrigation  80 mg Irrigation On Call   mouth rinse  15 mL Mouth Rinse BID   pantoprazole  40 mg Oral Daily   potassium chloride  20 mEq Oral Daily   sodium chloride flush  3 mL Intravenous Q12H   sodium chloride flush  3 mL Intravenous Q12H   sodium chloride flush  3 mL Intravenous Q12H   Continuous Infusions:  sodium chloride 10 mL/hr at 11/06/21 0600   sodium chloride     sodium chloride 10 mL/hr at 11/09/21 0600   sodium chloride 50 mL/hr at 11/09/21 0600   sodium chloride 250 mL (11/09/21 0646)    ceFAZolin (ANCEF) IV     lactated ringers Stopped (11/06/21 0752)   PRN Meds: sodium chloride, metoprolol tartrate, midazolam, morphine injection, ondansetron (ZOFRAN) IV, oxyCODONE, sodium chloride flush, sodium chloride flush, traMADol, zolpidem   Vital Signs    Vitals:   11/09/21 0400 11/09/21 0500 11/09/21 0600 11/09/21 0722  BP: (!) 165/79 (!) 171/95 (!) 163/123   Pulse: 80 82 84   Resp:      Temp:    98.2 F (36.8 C)  TempSrc:    Oral  SpO2: 95% 95% 94%   Weight:      Height:        Intake/Output Summary (Last 24 hours) at 11/09/2021 0743 Last data filed at 11/09/2021 0600 Gross per 24 hour  Intake 1218.1 ml  Output 2725 ml  Net -1506.9 ml    Last 3 Weights 11/08/2021 11/07/2021 11/06/2021  Weight (lbs) 205 lb 14.6 oz 217 lb 9.5 oz 228  lb 13.4 oz  Weight (kg) 93.4 kg 98.7 kg 103.8 kg      Telemetry    paced - Personally Reviewed  ECG    Personally Reviewed  Physical Exam   GEN: No acute distress.   Neck: No JVD. R IJ TVP. Cardiac: RRR, no murmurs, rubs, or gallops.  Respiratory: Clear to auscultation bilaterally. GI: Soft, nontender, non-distended  MS: No edema; No deformity. Neuro:  Nonfocal  Psych: Normal affect   Labs    High Sensitivity Troponin:  No results for input(s): TROPONINIHS in the last 720 hours.   Chemistry Recent Labs  Lab 11/03/21 2003 11/04/21 0428 11/05/21 1047 11/05/21 1511 11/05/21 1649 11/06/21 0312 11/06/21 0716 11/07/21 0518 11/08/21 0342 11/09/21 0557  NA 134*   < > 144   < > 140 141   < > 138 134* 134*  K 4.0   < > 3.7   < > 3.9 3.7   < > 3.7 3.6 4.0  CL 101   < > 103  --  103 107  --  105 103 102  CO2 21*   < > 22  --  24 26  --  26 24 22   GLUCOSE 149*   < > 177*  --  134* 136*  --  97 97 98  BUN 12   < > 10  --  11 12  --  16 18 16   CREATININE 0.95   < > 1.25*  --  1.30* 1.38*  --  1.43* 1.34* 1.09  CALCIUM 8.9   < > 7.4*  --  7.3* 7.1*  --  6.8* 7.8* 8.3*  MG  --    < > 2.4  --  2.1 2.2  --   --   --   --   PROT 6.3*  --   --   --   --   --   --   --   --   --   ALBUMIN 3.5  --   --   --   --   --   --   --   --   --   AST 36  --   --   --   --   --   --   --   --   --   ALT 24  --   --   --   --   --   --   --   --   --   ALKPHOS 55  --   --   --   --   --   --   --   --   --   BILITOT 1.7*  --   --   --   --   --   --   --   --   --   GFRNONAA >60   < > 57*  --  55* 51*  --  49* 53* >60  ANIONGAP 12   < > 19*  --  13 8  --  7 7 10    < > = values in this interval not displayed.     Lipids No results for input(s): CHOL, TRIG, HDL, LABVLDL, LDLCALC, CHOLHDL in the last 168 hours.  Hematology Recent Labs  Lab 11/07/21 0518 11/08/21 0342 11/09/21 0557  WBC 9.5 10.0 8.4  RBC 3.18* 3.28* 3.49*  HGB 9.3* 9.7* 10.3*  HCT 27.1* 28.4* 30.7*  MCV 85.2 86.6  88.0  MCH 29.2 29.6 29.5  MCHC 34.3 34.2 33.6  RDW 20.0* 19.6* 19.0*  PLT 101* 99* 102*    Thyroid No results for input(s): TSH, FREET4 in the last 168 hours.  BNPNo results for input(s): BNP, PROBNP in the last 168 hours.  DDimer No results for input(s): DDIMER in the last 168 hours.   Radiology    CARDIAC CATHETERIZATION  Result Date: 11/07/2021 1.  Successful temporary pacemaker placement with threshold of 0.4 mA, rate of 60 bpm, and output of 15 mA; epicardial pacing was resumed at 80 bpm with the output of 15 mA.   DG CHEST PORT 1 VIEW  Result Date: 11/08/2021 CLINICAL DATA:  Chest pain and shortness of breath. Status post CABG procedure. EXAM: PORTABLE CHEST 1 VIEW COMPARISON:  11/07/2021 FINDINGS: Status post CABG procedure right internal jugular pacer lead is identified terminating in the right ventricle. Mediastinal drain and left chest tube are stable in position. No pneumothorax visualized. Persistent small right pleural effusion. No interstitial edema or airspace consolidation. IMPRESSION: 1. Stable chest tube and mediastinal drain. No pneumothorax visualized. 2. Persistent small right  pleural effusion. Electronically Signed   By: Kerby Moors M.D.   On: 11/08/2021 10:07   DG CHEST PORT 1 VIEW  Result Date: 11/07/2021 CLINICAL DATA:  Status post coronary bypass surgery and aortic valve replacement EXAM: PORTABLE CHEST 1 VIEW COMPARISON:  Previous examinations including the study done earlier today. FINDINGS: There is interval placement of right IJ central venous catheter with its tip in the course of superior vena cava. There is interval removal of vascular sheath from right IJ. Metallic sutures are seen in the sternum. Skin staples are noted. No interval changes are noted in the left chest tube and mediastinal drains. There are linear densities in both lower lung fields suggesting subsegmental atelectasis with interval improvement. There are no signs of alveolar pulmonary edema  or new focal infiltrates. There is no significant pleural effusion or pneumothorax. IMPRESSION: Tip of right IJ central venous catheter is seen in the course of superior vena cava. There is interval improvement in aeration of lower lung fields suggesting decrease in subsegmental atelectasis. Electronically Signed   By: Elmer Picker M.D.   On: 11/07/2021 13:47    Cardiac Studies     Patient Profile     86 y.o. male with CAD and severe AS s/p CABG and AVR on 1/17/12023 who has developed complete heart block requiring pacing support. Permanent pacing will be required.  Assessment & Plan    #Complete heart block Temporary pacer in place Plan for dual chamber permanent pacemaker with left bundle area lead this morning. Keep NPO.  Risks, benefits, alternatives to PPM implantation were discussed in detail with the patient today. The patient understands that the risks include but are not limited to bleeding, infection, pneumothorax, perforation, tamponade, vascular damage, renal failure, MI, stroke, death, and lead dislodgement and wishes to proceed.    #CAD s/p CABG -management per primary  For questions or updates, please contact [place] Please consult [email or web address] for contact info under        Signed, Vickie Epley, MD  11/09/2021, 7:43 AM

## 2021-11-09 NOTE — Progress Notes (Signed)
° °   °  [address]       [place] [address]             [phone]      S/p pacemaker  Sleeping currently  BP (!) 173/90    Pulse 84    Temp 98 F (36.7 C) (Oral)    Resp 12    Ht 5' 11.5" (1.816 m)    Wt 93.4 kg    SpO2 94%    BMI 28.32 kg/m   Intake/Output Summary (Last 24 hours) at 11/09/2021 1710 Last data filed at 11/09/2021 1600 Gross per 24 hour  Intake 690.96 ml  Output 3200 ml  Net -2509.04 ml   Hypertensive- will restart lisinopril  Remo Lipps C. Roxan Hockey, MD [facility] [phone]

## 2021-11-10 ENCOUNTER — Inpatient Hospital Stay (HOSPITAL_COMMUNITY): Payer: Medicare HMO

## 2021-11-10 ENCOUNTER — Other Ambulatory Visit: Payer: Medicare HMO

## 2021-11-10 ENCOUNTER — Encounter (HOSPITAL_COMMUNITY): Payer: Self-pay | Admitting: Cardiology

## 2021-11-10 DIAGNOSIS — J939 Pneumothorax, unspecified: Secondary | ICD-10-CM

## 2021-11-10 DIAGNOSIS — Z950 Presence of cardiac pacemaker: Secondary | ICD-10-CM

## 2021-11-10 LAB — BASIC METABOLIC PANEL
Anion gap: 9 (ref 5–15)
BUN: 15 mg/dL (ref 8–23)
CO2: 23 mmol/L (ref 22–32)
Calcium: 8.7 mg/dL — ABNORMAL LOW (ref 8.9–10.3)
Chloride: 100 mmol/L (ref 98–111)
Creatinine, Ser: 1.03 mg/dL (ref 0.61–1.24)
GFR, Estimated: >60 mL/min (ref >60)
Glucose, Bld: 109 mg/dL — ABNORMAL HIGH (ref 70–99)
Potassium: 3.9 mmol/L (ref 3.5–5.1)
Sodium: 132 mmol/L — ABNORMAL LOW (ref 135–145)

## 2021-11-10 LAB — CBC
HCT: 30.3 % — ABNORMAL LOW (ref 39.0–52.0)
Hemoglobin: 10.6 g/dL — ABNORMAL LOW (ref 13.0–17.0)
MCH: 30.2 pg (ref 26.0–34.0)
MCHC: 35 g/dL (ref 30.0–36.0)
MCV: 86.3 fL (ref 80.0–100.0)
Platelets: 115 10*3/uL — ABNORMAL LOW (ref 150–400)
RBC: 3.51 MIL/uL — ABNORMAL LOW (ref 4.22–5.81)
RDW: 18.6 % — ABNORMAL HIGH (ref 11.5–15.5)
WBC: 8.1 10*3/uL (ref 4.0–10.5)
nRBC: 0.2 % (ref 0.0–0.2)

## 2021-11-10 MED ORDER — LEVALBUTEROL HCL 1.25 MG/0.5ML IN NEBU
INHALATION_SOLUTION | RESPIRATORY_TRACT | Status: AC
  Filled 2021-11-10: qty 0.5

## 2021-11-10 MED ORDER — METOPROLOL TARTRATE 12.5 MG HALF TABLET
12.5000 mg | ORAL_TABLET | Freq: Two times a day (BID) | ORAL | Status: DC
  Administered 2021-11-10 (×2): 12.5 mg via ORAL
  Filled 2021-11-10 (×2): qty 1

## 2021-11-10 MED ORDER — LIDOCAINE HCL (PF) 1 % IJ SOLN
INTRAMUSCULAR | Status: AC
  Filled 2021-11-10: qty 5

## 2021-11-10 MED ORDER — LEVALBUTEROL HCL 1.25 MG/0.5ML IN NEBU
1.2500 mg | INHALATION_SOLUTION | Freq: Four times a day (QID) | RESPIRATORY_TRACT | Status: DC | PRN
  Administered 2021-11-10: 1.25 mg via RESPIRATORY_TRACT

## 2021-11-10 MED FILL — Cefazolin Sodium-Dextrose IV Solution 2 GM/100ML-4%: INTRAVENOUS | Qty: 100 | Status: AC

## 2021-11-10 MED FILL — Gentamicin Sulfate Inj 40 MG/ML: INTRAMUSCULAR | Qty: 80 | Status: AC

## 2021-11-10 NOTE — Progress Notes (Addendum)
Inpatient Rehab Admissions Coordinator:   Per therapy recommendations,  patient was screened for CIR candidacy by Brigetta Beckstrom, MS, CCC-SLP . At this time, Pt. Appears to be a a potential candidate for CIR. I will request   order for rehab consult per protocol for full assessment. Please contact me any with questions.  Noemi Bellissimo, MS, CCC-SLP Rehab Admissions Coordinator  [phone] (celll) [phone] (office)  

## 2021-11-10 NOTE — Progress Notes (Signed)
° °   °  [address]       [place] [address]             [phone]                 1 Day Post-Op Procedure(s) (LRB): PACEMAKER IMPLANT (N/A)   Events: Bilateral pneumoT and dyspnea Pigtails placed with some re-expansion _______________________________________________________________ Vitals: BP (!) 138/93    Pulse 100    Temp 97.9 F (36.6 C) (Oral)    Resp (!) 31    Ht 5' 11.5" (1.816 m)    Wt 93.4 kg    SpO2 96%    BMI 28.32 kg/m  Filed Weights   11/06/21 0600 11/07/21 0500 11/08/21 0530  Weight: 103.8 kg 98.7 kg 93.4 kg     - Neuro: alert NAD  - Cardiovascular: paced  Drips: none.      - Pulm: No leak.  EWOB SS CT output FiO2 (%):  [100 %] 100 %  ABG    Component Value Date/Time   PHART 7.490 (H) 11/06/2021 0925   PCO2ART 30.8 (L) 11/06/2021 0925   PO2ART 64 (L) 11/06/2021 0925   HCO3 23.5 11/06/2021 0925   TCO2 24 11/06/2021 0925   ACIDBASEDEF 6.0 (H) 11/05/2021 1032   O2SAT 94.0 11/06/2021 0925    - Abd: ND - Extremity: warm  .Intake/Output      01/22 0701 01/23 0700 01/23 0701 01/24 0700   P.O.     I.V. (mL/kg) 342.9 (3.7)    Total Intake(mL/kg) 342.9 (3.7)    Urine (mL/kg/hr) 2350 (1)    Stool 0    Chest Tube 1025    Total Output 3375    Net -3032.1         Stool Occurrence 1 x       _______________________________________________________________ Labs: CBC Latest Ref Rng & Units 11/10/2021 11/09/2021 11/08/2021  WBC 4.0 - 10.5 K/uL 8.1 8.4 10.0  Hemoglobin 13.0 - 17.0 g/dL 10.6(L) 10.3(L) 9.7(L)  Hematocrit 39.0 - 52.0 % 30.3(L) 30.7(L) 28.4(L)  Platelets 150 - 400 K/uL 115(L) 102(L) 99(L)   CMP Latest Ref Rng & Units 11/10/2021 11/09/2021 11/08/2021  Glucose 70 - 99 mg/dL 109(H) 98 97  BUN 8 - 23 mg/dL 15 16 18   Creatinine 0.61 - 1.24 mg/dL 1.03 1.09 1.34(H)  Sodium 135 - 145 mmol/L 132(L) 134(L) 134(L)  Potassium 3.5 - 5.1 mmol/L 3.9 4.0 3.6  Chloride 98 - 111 mmol/L 100 102 103  CO2 22 - 32 mmol/L 23 22 24    Calcium 8.9 - 10.3 mg/dL 8.7(L) 8.3(L) 7.8(L)  Total Protein 6.5 - 8.1 g/dL - - -  Total Bilirubin 0.3 - 1.2 mg/dL - - -  Alkaline Phos 38 - 126 U/L - - -  AST 15 - 41 U/L - - -  ALT 0 - 44 U/L - - -    CXR: Residual pneumoT B.    _______________________________________________________________  Assessment and Plan: POD 6 s/p AVR CABG, POD 5 s/p re-exploration, POD 1 s/p PPM  Neuro: pain controlled CV: PPM in place.  Will titrate BP meds.  Restarting BB.  On A/S Pulm: will keep Cts.  Pulm hygiene Renal: creat stable GI: on diet Heme: stable ID: afebrile Endo: SSI Dispo: continue ICU care   Lajuana Matte 11/10/2021 7:17 AM

## 2021-11-10 NOTE — Evaluation (Signed)
Occupational Therapy Evaluation Patient Details Name: Joshua [name] MRN: [id number] DOB: [birth year] Today's Date: 11/10/2021   History of Present Illness Pt is an 86 y.o. male with severe 3-vessel heart disease, admitted 11/04/21 for CABG x3 and AVR. Post-op course complicated by acute blood loss anemia requiring mediastinal re-exploration 1/18. ETT 1/17-1/19. Pt with complete heart block s/p temporary pacemaker insertion 1/20. Pt with permanent pacer placed 1/22. Pt developed bil pneumothorax on 1/23 and bil chest tubes placed.  PMH inlcudes CAD, severe AS, HTN, HLD, arthritis, GERD, cataracts.   Clinical Impression   This 86 yo male admitted and underwent above presents to acute OT with PLOF of being totally independent with all aspects of life including taking care of his 84 yo paralyzed grandson. He currently has multiple lines/tubes as well as sternal precautions and limited use of LUE due to recent pacemaker thus making him at a setup-total A for basic ADLs and +2 Mod A for sit<>stand with inability to pivot safely once in standing. He will continue to benefit from acute OT with follow up on CIR.      Recommendations for follow up therapy are one component of a multi-disciplinary discharge planning process, led by the attending physician.  Recommendations may be updated based on patient status, additional functional criteria and insurance authorization.   Follow Up Recommendations  Acute inpatient rehab (3hours/day)    Assistance Recommended at Discharge Frequent or constant Supervision/Assistance  Patient can return home with the following Two people to help with bathing/dressing/bathroom;Two people to help with walking and/or transfers;Assistance with cooking/housework;Assist for transportation    Functional Status Assessment  Patient has had a recent decline in their functional status and demonstrates the ability to make significant improvements in function in a reasonable and  predictable amount of time.  Equipment Recommendations  Other (comment) (TBD next venue)       Precautions / Restrictions Precautions Precautions: Fall;Sternal;ICD/Pacemaker Precaution Comments: bilateral chest tubes Restrictions Other Position/Activity Restrictions: sternal      Mobility Bed Mobility Overal bed mobility: Needs Assistance Bed Mobility: Supine to Sit     Supine to sit: +2 for physical assistance, Mod assist, HOB elevated     General bed mobility comments: Assist to bring legs off of bed, elevate trunk into sitting and bring hips to EOB.    Transfers Overall transfer level: Needs assistance Equipment used: Ambulation equipment used Transfers: Sit to/from Stand Sit to Stand: Mod assist, +2 physical assistance, From elevated surface           General transfer comment: stood with +2 Mod A but unable to move his feet to pivot, then used Denna Haggard for sit<>stand and turn to recliner      Balance Overall balance assessment: Needs assistance Sitting-balance support: Single extremity supported, Feet supported Sitting balance-Leahy Scale: Poor Sitting balance - Comments: min to min guard assist with pt leaning rt. Pt reports he can tell he is leaning rt but didn't self correct unless cued to Postural control: Right lateral lean Standing balance support: Bilateral upper extremity supported Standing balance-Leahy Scale: Poor Standing balance comment: + 2 mod assist for static standing                           ADL either performed or assessed with clinical judgement   ADL Overall ADL's : Needs assistance/impaired Eating/Feeding: Independent;Sitting   Grooming: Set up;Sitting   Upper Body Bathing: Minimal assistance;Sitting   Lower Body Bathing: Maximal assistance  Lower Body Bathing Details (indicate cue type and reason): Mod A +2 sit<stand Upper Body Dressing : Moderate assistance;Sitting   Lower Body Dressing: Total assistance Lower Body  Dressing Details (indicate cue type and reason): Mod A +2 sit<stand   Toilet Transfer Details (indicate cue type and reason): Mod A sit<>stand with sara stedy Toileting- Clothing Manipulation and Hygiene: Total assistance Toileting - Clothing Manipulation Details (indicate cue type and reason): Mod A +2 sit<stand with sara stedy             Vision Baseline Vision/History: 1 Wears glasses Patient Visual Report: No change from baseline              Pertinent Vitals/Pain Pain Assessment Pain Assessment: 0-10 Pain Score: 6  Pain Location: back Pain Descriptors / Indicators: Aching Pain Intervention(s): Limited activity within patient's tolerance, Monitored during session, Repositioned     Hand Dominance Right   Extremity/Trunk Assessment Upper Extremity Assessment Upper Extremity Assessment: Generalized weakness   Lower Extremity Assessment Lower Extremity Assessment: Generalized weakness       Communication Communication Communication: No difficulties   Cognition Arousal/Alertness: Awake/alert Behavior During Therapy: WFL for tasks assessed/performed Overall Cognitive Status: Within Functional Limits for tasks assessed                                       General Comments  VSS            Home Living Family/patient expects to be discharged to:: Private residence Living Arrangements: Alone Available Help at Discharge: Family;Available PRN/intermittently Type of Home: House Home Access: Level entry     Home Layout: One level     Bathroom Shower/Tub: Occupational psychologist: Handicapped height     Home Equipment: Rollator (4 wheels);Cane - single point;Toilet riser;Grab bars - toilet;Grab bars - tub/shower;Hand held shower head;Wheelchair - manual          Prior Functioning/Environment Prior Level of Function : Independent/Modified Independent;Driving;History of Falls (last six months)             Mobility Comments:  No assistive device. 1 fall in last 6 months          OT Problem List: Decreased strength;Impaired balance (sitting and/or standing);Pain      OT Treatment/Interventions: Self-care/ADL training;DME and/or AE instruction;Patient/family education;Balance training    OT Goals(Current goals can be found in the care plan section) Acute Rehab OT Goals Patient Stated Goal: to be able to get back home and continue taking care of my grandson OT Goal Formulation: With patient Time For Goal Achievement: 11/24/21 Potential to Achieve Goals: Good  OT Frequency: Min 2X/week    Co-evaluation PT/OT/SLP Co-Evaluation/Treatment: Yes Reason for Co-Treatment: For patient/therapist safety;To address functional/ADL transfers PT goals addressed during session: Mobility/safety with mobility;Balance;Strengthening/ROM OT goals addressed during session: Strengthening/ROM;ADL's and self-care      AM-PAC OT "6 Clicks" Daily Activity     Outcome Measure Help from another person eating meals?: None Help from another person taking care of personal grooming?: A Little Help from another person toileting, which includes using toliet, bedpan, or urinal?: Total Help from another person bathing (including washing, rinsing, drying)?: A Lot Help from another person to put on and taking off regular upper body clothing?: A Lot Help from another person to put on and taking off regular lower body clothing?: Total 6 Click Score: 13   End of Session  Nurse Communication:  (RN and tech in to help [place] with transfer due to both chest tubes to suction)  Activity Tolerance: Patient tolerated treatment well Patient left: in chair;with call bell/phone within reach  OT Visit Diagnosis: Unsteadiness on feet (R26.81);Other abnormalities of gait and mobility (R26.89);Muscle weakness (generalized) (M62.81);Pain Pain - part of body:  (back)                Time: 3662-9476 OT Time Calculation (min): 28 min Charges:  OT General  Charges $OT Visit: 1 Visit OT Evaluation $OT Eval Moderate Complexity: 1 Mod  Golden Circle, OTR/L Acute [employer] Pager [phone] Office [phone]    Almon Register 11/10/2021, 11:44 AM

## 2021-11-10 NOTE — Plan of Care (Signed)
Problem: Clinical Measurements: Goal: Ability to maintain clinical measurements within normal limits will improve Outcome: Progressing   Problem: Cardiac: Goal: Will achieve and/or maintain hemodynamic stability Outcome: Progressing   Problem: Respiratory: Goal: Respiratory status will improve Outcome: Progressing   Problem: Safety: Goal: Non-violent Restraint(s) Outcome: Completed/Met

## 2021-11-10 NOTE — Procedures (Signed)
Procedure note  Joshua [name] developed respiratory distress this AM CXR showed bilateral pneumothoraces  Informed consent obtained for bilateral chest tube placement  Sterile technique utilized.  Local anesthesia with 10 ml of 1% lidocaine on each side  14 F pigtail catheter placed left chest using modified Seldinger technique. + rush of air, Some serosanguinous fluid drainage as well ~400 ml in 1st 15 min. He experienced immediate relief but still tachypneic  14 F pigtail placed on right same technique. + rush of air.  Joshua [name] tolerated the procedure well with dramatic improvement in dyspnea.  CXR ordered  Remo Lipps C. Roxan Hockey, MD [facility] [phone]

## 2021-11-10 NOTE — Progress Notes (Signed)
Patient ID: Joshua [name], male   DOB: 11/03/1935, 86 y.o.   MRN: [id number] TCTS Evening Rounds:  Hemodynamically stable. AV paced   Sats 96% 2L.  Urine output ok CT output decreasing today.   Has been up to chair but not ambulating.

## 2021-11-10 NOTE — Evaluation (Signed)
Physical Therapy Evaluation Patient Details Name: Joshua [name] MRN: [id number] DOB: 08-30-1936 Today's Date: 11/10/2021  History of Present Illness  Pt is an 86 y.o. male with severe 3-vessel heart disease, admitted 11/04/21 for CABG x3 and AVR. Post-op course complicated by acute blood loss anemia requiring mediastinal re-exploration 1/18. ETT 1/17-1/19. Pt with complete heart block s/p temporary pacemaker insertion 1/20. Pt with permanent pacer placed 1/22. Pt developed bil pneumothorax on 1/23 and bil chest tubes placed.  PMH inlcudes CAD, severe AS, HTN, HLD, arthritis, GERD, cataracts.  Clinical Impression  Pt presents to PT with decreased mobility due to decreased strength, decreased balance, and decreased functional activity tolerance. Pt has intermittent support at home so will likely need to be fairly independent at DC. Pt motivated to return to prior level of independence. Feel he would benefit from acute inpatient rehab stay.         Recommendations for follow up therapy are one component of a multi-disciplinary discharge planning process, led by the attending physician.  Recommendations may be updated based on patient status, additional functional criteria and insurance authorization.  Follow Up Recommendations Acute inpatient rehab (3hours/day)    Assistance Recommended at Discharge Intermittent Supervision/Assistance  Patient can return home with the following  Two people to help with walking and/or transfers;Help with stairs or ramp for entrance    Equipment Recommendations None recommended by PT  Recommendations for Other Services  Rehab consult    Functional Status Assessment Patient has had a recent decline in their functional status and demonstrates the ability to make significant improvements in function in a reasonable and predictable amount of time.     Precautions / Restrictions Precautions Precautions: Fall;Sternal;ICD/Pacemaker Precaution Comments: bilateral  chest tubes Restrictions Other Position/Activity Restrictions: sternal      Mobility  Bed Mobility Overal bed mobility: Needs Assistance Bed Mobility: Supine to Sit     Supine to sit: +2 for physical assistance, Mod assist, HOB elevated     General bed mobility comments: Assist to bring legs off of bed, elevate trunk into sitting and bring hips to EOB.    Transfers Overall transfer level: Needs assistance Equipment used: 2 person hand held assist, Ambulation equipment used Transfers: Sit to/from Stand, Bed to chair/wheelchair/BSC Sit to Stand: +2 physical assistance, Mod assist           General transfer comment: Stood from bedside with +2 forearm assist and using pad under hips to bring hips up. Pt with difficulty moving feet so returned to sitting and used Stedy for bed to chair. With Stedy assist to bring hips up and verbal/tactile cues to extend hips/trunk more. Not quite able to achieve fully upright posture. Transfer via Lift Equipment: Stedy  Ambulation/Gait               General Gait Details: Biomedical engineer Rankin (Stroke Patients Only)       Balance Overall balance assessment: Needs assistance Sitting-balance support: Single extremity supported, Feet supported Sitting balance-Leahy Scale: Poor Sitting balance - Comments: min to min guard assist with pt leaning rt. Pt reports he can tell he is leaning rt but didn't self correct unless cued to Postural control: Right lateral lean Standing balance support: Bilateral upper extremity supported Standing balance-Leahy Scale: Poor Standing balance comment: + 2 mod assist for static standing  Pertinent Vitals/Pain Pain Assessment Pain Assessment: 0-10 Pain Score: 6  Pain Location: back Pain Descriptors / Indicators: Aching    Home Living Family/patient expects to be discharged to:: Private residence Living  Arrangements: Alone Available Help at Discharge: Family;Available PRN/intermittently Type of Home: House Home Access: Level entry       Home Layout: One level Home Equipment: Rollator (4 wheels);Cane - single point;Toilet riser;Grab bars - toilet;Grab bars - tub/shower;Hand held shower head;Wheelchair - manual      Prior Function Prior Level of Function : Independent/Modified Independent;Driving;History of Falls (last six months)             Mobility Comments: No assistive device. 1 fall in last 6 months       Hand Dominance   Dominant Hand: Right    Extremity/Trunk Assessment   Upper Extremity Assessment Upper Extremity Assessment: Generalized weakness    Lower Extremity Assessment Lower Extremity Assessment: Generalized weakness       Communication   Communication: No difficulties  Cognition Arousal/Alertness: Awake/alert Behavior During Therapy: WFL for tasks assessed/performed Overall Cognitive Status: Within Functional Limits for tasks assessed                                          General Comments General comments (skin integrity, edema, etc.): VSS    Exercises     Assessment/Plan    PT Assessment Patient needs continued PT services  PT Problem List Decreased strength;Decreased activity tolerance;Decreased balance;Decreased mobility;Decreased knowledge of use of DME;Decreased knowledge of precautions       PT Treatment Interventions DME instruction;Gait training;Functional mobility training;Therapeutic activities;Therapeutic exercise;Balance training;Patient/family education    PT Goals (Current goals can be found in the Care Plan section)  Acute Rehab PT Goals Patient Stated Goal: to walk PT Goal Formulation: With patient Time For Goal Achievement: 11/24/21 Potential to Achieve Goals: Good    Frequency Min 3X/week     Co-evaluation PT/OT/SLP Co-Evaluation/Treatment: Yes Reason for Co-Treatment: For patient/therapist  safety;To address functional/ADL transfers PT goals addressed during session: Mobility/safety with mobility;Balance;Strengthening/ROM OT goals addressed during session: Strengthening/ROM;ADL's and self-care       AM-PAC PT "6 Clicks" Mobility  Outcome Measure Help needed turning from your back to your side while in a flat bed without using bedrails?: A Lot Help needed moving from lying on your back to sitting on the side of a flat bed without using bedrails?: Total Help needed moving to and from a bed to a chair (including a wheelchair)?: Total Help needed standing up from a chair using your arms (e.g., wheelchair or bedside chair)?: Total Help needed to walk in hospital room?: Total Help needed climbing 3-5 steps with a railing? : Total 6 Click Score: 7    End of Session   Activity Tolerance: Patient tolerated treatment well Patient left: in chair;with call bell/phone within reach Nurse Communication: Mobility status;Need for lift equipment PT Visit Diagnosis: Unsteadiness on feet (R26.81);Other abnormalities of gait and mobility (R26.89);Muscle weakness (generalized) (M62.81)    Time: 8786-7672 PT Time Calculation (min) (ACUTE ONLY): 26 min   Charges:   PT Evaluation $PT Eval Moderate Complexity: 1 [place] Pager [phone] Office [place] 11/10/2021, 11:44 AM

## 2021-11-10 NOTE — Progress Notes (Signed)
Progress Note  Patient Name: Joshua [name] Date of Encounter: 11/10/2021  [facility] Cardiologist: None   Subjective   Pacemaker for complete heart block over the weekend.   This morning underwent bilateral chest tube placement for bilateral pneumothoraces.  Inpatient Medications    Scheduled Meds:  aspirin EC  325 mg Oral Daily   Or   aspirin  324 mg Per Tube Daily   atorvastatin  40 mg Per Tube Daily   bisacodyl  10 mg Oral Daily   Or   bisacodyl  10 mg Rectal Daily   Chlorhexidine Gluconate Cloth  6 each Topical Daily   docusate sodium  200 mg Oral Daily   levalbuterol       lidocaine (PF)       lidocaine (PF)       lisinopril  20 mg Oral Daily   mouth rinse  15 mL Mouth Rinse BID   metoprolol tartrate  12.5 mg Oral BID   pantoprazole  40 mg Oral Daily   potassium chloride  20 mEq Oral Daily   sodium chloride flush  3 mL Intravenous Q12H   Continuous Infusions:  PRN Meds: acetaminophen, levalbuterol, morphine injection, ondansetron (ZOFRAN) IV, oxyCODONE, sodium chloride flush, traMADol, zolpidem   Vital Signs    Vitals:   11/10/21 0500 11/10/21 0600 11/10/21 0700 11/10/21 0800  BP: (!) 192/123 (!) 138/93 107/78   Pulse: (!) 103 100 98   Resp:      Temp:    (!) 97.5 F (36.4 C)  TempSrc:    Oral  SpO2: 95% 96% 91%   Weight:      Height:        Intake/Output Summary (Last 24 hours) at 11/10/2021 0842 Last data filed at 11/10/2021 0800 Gross per 24 hour  Intake 466.78 ml  Output 3435 ml  Net -2968.22 ml   Last 3 Weights 11/08/2021 11/07/2021 11/06/2021  Weight (lbs) 205 lb 14.6 oz 217 lb 9.5 oz 228 lb 13.4 oz  Weight (kg) 93.4 kg 98.7 kg 103.8 kg      Telemetry    A sensed, V paced- Personally Reviewed  ECG    Atrial sensed, Ventricular paced- Personally Reviewed  Physical Exam  Appears well.  Comfortable.  Conversant. GEN: No acute distress.   Neck: No JVD Cardiac: RRR, no murmurs or gallops.  Had a distinct 3 component pericardial  rub.  Left subclavian pacemaker site with a tiny amount of oozing on dressing Respiratory: Clear to auscultation bilaterally.  Bilateral pigtail chest tube GI: Soft, nontender, non-distended  MS: No edema; No deformity. Neuro:  Nonfocal  Psych: Normal affect   Labs    High Sensitivity Troponin:  No results for input(s): TROPONINIHS in the last 720 hours.   Chemistry Recent Labs  Lab 11/03/21 2003 11/04/21 0428 11/05/21 1047 11/05/21 1511 11/05/21 1649 11/06/21 0312 11/06/21 0716 11/08/21 0342 11/09/21 0557 11/10/21 0134  NA 134*   < > 144   < > 140 141   < > 134* 134* 132*  K 4.0   < > 3.7   < > 3.9 3.7   < > 3.6 4.0 3.9  CL 101   < > 103  --  103 107   < > 103 102 100  CO2 21*   < > 22  --  24 26   < > 24 22 23   GLUCOSE 149*   < > 177*  --  134* 136*   < > 97  98 109*  BUN 12   < > 10  --  11 12   < > 18 16 15   CREATININE 0.95   < > 1.25*  --  1.30* 1.38*   < > 1.34* 1.09 1.03  CALCIUM 8.9   < > 7.4*  --  7.3* 7.1*   < > 7.8* 8.3* 8.7*  MG  --    < > 2.4  --  2.1 2.2  --   --   --   --   PROT 6.3*  --   --   --   --   --   --   --   --   --   ALBUMIN 3.5  --   --   --   --   --   --   --   --   --   AST 36  --   --   --   --   --   --   --   --   --   ALT 24  --   --   --   --   --   --   --   --   --   ALKPHOS 55  --   --   --   --   --   --   --   --   --   BILITOT 1.7*  --   --   --   --   --   --   --   --   --   GFRNONAA >60   < > 57*  --  55* 51*   < > 53* >60 >60  ANIONGAP 12   < > 19*  --  13 8   < > 7 10 9    < > = values in this interval not displayed.    Lipids No results for input(s): CHOL, TRIG, HDL, LABVLDL, LDLCALC, CHOLHDL in the last 168 hours.  Hematology Recent Labs  Lab 11/08/21 0342 11/09/21 0557 11/10/21 0134  WBC 10.0 8.4 8.1  RBC 3.28* 3.49* 3.51*  HGB 9.7* 10.3* 10.6*  HCT 28.4* 30.7* 30.3*  MCV 86.6 88.0 86.3  MCH 29.6 29.5 30.2  MCHC 34.2 33.6 35.0  RDW 19.6* 19.0* 18.6*  PLT 99* 102* 115*   Thyroid No results for input(s): TSH,  FREET4 in the last 168 hours.  BNPNo results for input(s): BNP, PROBNP in the last 168 hours.  DDimer No results for input(s): DDIMER in the last 168 hours.   Radiology    EP PPM/ICD IMPLANT  Result Date: 11/09/2021  CONCLUSIONS:  1. Symptomatic complete heart block  2. Successful dual chamber permanent pacemaker implantation  3.  No early apparent complications.   DG CHEST PORT 1 VIEW  Addendum Date: 11/10/2021   ADDENDUM REPORT: 11/10/2021 04:45 ADDENDUM: Critical findings were reported to Dr. Roxan Hockey at 443 a.m. Electronically Signed   By: Brett Fairy M.D.   On: 11/10/2021 04:45   Result Date: 11/10/2021 CLINICAL DATA:  Encounter for wheezing. EXAM: PORTABLE CHEST 1 VIEW COMPARISON:  11/08/2021. FINDINGS: The heart is enlarged and there is evidence of prior cardiothoracic surgery. A prosthetic cardiac valve is noted. Sternotomy wires are present over the midline. A left pacemaker is noted. There is a moderate pneumothorax on the left a large pneumothorax on the right with atelectasis and partial lung collapse. No definite effusion. No acute osseous abnormality. IMPRESSION: 1. Large pneumothorax on the right and moderate pneumothorax  on the left with bilateral atelectasis and partial lung collapse. 2. Cardiomegaly. Electronically Signed: By: Brett Fairy M.D. On: 11/10/2021 04:38    Cardiac Studies   Pacemaker interrogation today shows normal atrial and ventricular lead impedance/sensing/pacing thresholds.  He remains in complete heart block.  Normal device function.  Patient Profile     86 y.o. male now day #6 s/p AVR/CABG (01/17 23 mm Inspiris valve, LIMA to LAD, SVG to PDA, SVG to OM) for severe AS + 99% mid RCA, 70% calcified ostial LAD), day #5 s/p reexploration, day #1 s/p dual-chamber permanent pacemaker, bilateral chest tubes placed  today for bilateral pneumothoraces.  Assessment & Plan    AS s/p AVR: Normal valve function by physical exam, pending echo. CAD s/p CABG:  LIMA to LAD, SVG to PDA, SVG to OM CHB: Remains pacemaker dependent S/P PPM: Normal device function. Bilateral pneumo: Mechanism unclear, occurred roughly 24 hours after pacemaker implantation but also after removal of postop chest tube.  Improved following bilateral pigtail chest tubes.     For questions or updates, please contact [place] Please consult [email or web address] for contact info under        Signed, Sanda Klein, MD  11/10/2021, 8:42 AM

## 2021-11-11 ENCOUNTER — Inpatient Hospital Stay (HOSPITAL_COMMUNITY): Payer: Medicare HMO

## 2021-11-11 LAB — BASIC METABOLIC PANEL
Anion gap: 10 (ref 5–15)
BUN: 14 mg/dL (ref 8–23)
CO2: 24 mmol/L (ref 22–32)
Calcium: 8.4 mg/dL — ABNORMAL LOW (ref 8.9–10.3)
Chloride: 96 mmol/L — ABNORMAL LOW (ref 98–111)
Creatinine, Ser: 0.98 mg/dL (ref 0.61–1.24)
GFR, Estimated: >60 mL/min (ref >60)
Glucose, Bld: 112 mg/dL — ABNORMAL HIGH (ref 70–99)
Potassium: 4 mmol/L (ref 3.5–5.1)
Sodium: 130 mmol/L — ABNORMAL LOW (ref 135–145)

## 2021-11-11 LAB — CBC
HCT: 28.8 % — ABNORMAL LOW (ref 39.0–52.0)
Hemoglobin: 9.8 g/dL — ABNORMAL LOW (ref 13.0–17.0)
MCH: 29.6 pg (ref 26.0–34.0)
MCHC: 34 g/dL (ref 30.0–36.0)
MCV: 87 fL (ref 80.0–100.0)
Platelets: 126 10*3/uL — ABNORMAL LOW (ref 150–400)
RBC: 3.31 MIL/uL — ABNORMAL LOW (ref 4.22–5.81)
RDW: 18.5 % — ABNORMAL HIGH (ref 11.5–15.5)
WBC: 11.2 10*3/uL — ABNORMAL HIGH (ref 4.0–10.5)
nRBC: 0 % (ref 0.0–0.2)

## 2021-11-11 MED ORDER — METOPROLOL TARTRATE 25 MG PO TABS
25.0000 mg | ORAL_TABLET | Freq: Two times a day (BID) | ORAL | Status: DC
  Administered 2021-11-11 – 2021-11-21 (×18): 25 mg via ORAL
  Filled 2021-11-11 (×21): qty 1

## 2021-11-11 MED ORDER — FUROSEMIDE 10 MG/ML IJ SOLN
40.0000 mg | Freq: Once | INTRAMUSCULAR | Status: AC
  Administered 2021-11-11: 08:00:00 40 mg via INTRAVENOUS
  Filled 2021-11-11: qty 4

## 2021-11-11 NOTE — Progress Notes (Signed)
Inpatient Rehabilitation Admissions Coordinator   I met at bedside with patient and his son, [name]. We dicussed goals and expectations of a possible Cir admit. They prefer CIR rather than SNF. Once chest tubes removed, I could then begin Auth with [facility] for a possible CIR admit. I will follow his progress.  Danne Baxter, RN, MSN Rehab Admissions Coordinator [phone] 11/11/2021 11:36 AM

## 2021-11-11 NOTE — Plan of Care (Signed)
°  Problem: Cardiovascular: Goal: Vascular access site(s) Level 0-1 will be maintained Outcome: Progressing   Problem: Nutrition: Goal: Adequate nutrition will be maintained Outcome: Progressing   Problem: Cardiac: Goal: Will achieve and/or maintain hemodynamic stability Outcome: Progressing   Problem: Urinary Elimination: Goal: Ability to achieve and maintain adequate renal perfusion and functioning will improve Outcome: Progressing

## 2021-11-11 NOTE — TOC Initial Note (Signed)
Transition of Care [facility]) - Initial/Assessment Note    Patient Details  Name: Joshua [name] MRN: [id number] Date of Birth: May 01, 1936  [facility] CM/SW Contact:    Bethena Roys, RN Phone Number: 11/11/2021, 3:35 PM  Clinical Narrative:  Patient post CABG/AVR and POD-2 PPM. Patient has chest tubes in place. CIR is following the patient for possible CIR admit once chest tubes removed. Case Manager continues to follow  for additional disposition needs.           Expected Discharge Plan: IP Rehab Facility Barriers to Discharge: Continued Medical Work up   Expected Discharge Plan and Services Expected Discharge Plan: [place] In-house Referral: NA Discharge Planning Services: CM Consult Post Acute Care Choice: IP Rehab Living arrangements for the past 2 months: Single Family Home                 DME Arranged: N/A DME Agency: NA     Prior Living Arrangements/Services Living arrangements for the past 2 months: Single Family Home Lives with::  (has family support of sons.) Patient language and need for interpreter reviewed:: Yes        Need for Family Participation in Patient Care: Yes (Comment) Care giver support system in place?: Yes (comment)   Criminal Activity/Legal Involvement Pertinent to Current Situation/Hospitalization: No - Comment as needed  Activities of Daily Living Home Assistive Devices/Equipment: Grab bars in shower, Wheelchair, Environmental consultant (specify type), Cane (specify quad or straight), Blood pressure cuff, Eyeglasses, Dentures (specify type) ADL Screening (condition at time of admission) Patient's cognitive ability adequate to safely complete daily activities?: Yes Is the patient deaf or have difficulty hearing?: No Does the patient have difficulty seeing, even when wearing glasses/contacts?: Yes Does the patient have difficulty concentrating, remembering, or making decisions?: No Patient able to express need for assistance  with ADLs?: Yes Does the patient have difficulty dressing or bathing?: No Independently performs ADLs?: Yes (appropriate for developmental age) Does the patient have difficulty walking or climbing stairs?: Yes Weakness of Legs: Both Weakness of Arms/Hands: None  Permission Sought/Granted Permission sought to share information with : Case Manager, Customer service manager, Family Supports                Emotional Assessment Appearance:: Appears stated age       Alcohol / Substance Use: Not Applicable Psych Involvement: No (comment)  Admission diagnosis:  Severe aortic stenosis [I35.0] Patient Active Problem List   Diagnosis Date Noted   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   S/P aortic valve replacement with bioprosthetic valve 11/04/2021   Severe aortic stenosis    Infected abrasion of fifth toe, right, initial encounter 10/31/2019   Pain due to onychomycosis of toenails of both feet 04/25/2019   Porokeratosis 04/25/2019   Gastroesophageal reflux disease 04/22/2018   Moderate calcific aortic stenosis 08/14/2016   PVCs (premature ventricular contractions) 08/14/2016   Hypercholesterolemia 08/14/2016   DJD (degenerative joint disease) 08/14/2016   Essential hypertension 07/16/2016   PCP:  Shelda Pal, DO Pharmacy:   [facility] - [place], [place] [address] [place] [address] Phone: [phone] Fax: [phone] - [place], [place] - [address] [place] [address] [place] [address] Phone: [phone] Fax: [phone]     Social Determinants of Health (SDOH) Interventions    Readmission Risk Interventions No flowsheet data found.

## 2021-11-11 NOTE — Progress Notes (Signed)
° °   °  [address]       [place] [address]             [phone]                 2 Days Post-Op Procedure(s) (LRB): PACEMAKER IMPLANT (N/A)   Events: No events ____________________________ Vitals: BP (!) 130/93    Pulse 90    Temp 97.6 F (36.4 C) (Axillary)    Resp (!) 22    Ht 5' 11.5" (1.816 m)    Wt 87.4 kg    SpO2 94%    BMI 26.49 kg/m  Filed Weights   11/07/21 0500 11/08/21 0530 11/11/21 0608  Weight: 98.7 kg 93.4 kg 87.4 kg     - Neuro: alert NAD  - Cardiovascular: paced  Drips: none.      - Pulm: No leak.  EWOB SS CT output    ABG    Component Value Date/Time   PHART 7.490 (H) 11/06/2021 0925   PCO2ART 30.8 (L) 11/06/2021 0925   PO2ART 64 (L) 11/06/2021 0925   HCO3 23.5 11/06/2021 0925   TCO2 24 11/06/2021 0925   ACIDBASEDEF 6.0 (H) 11/05/2021 1032   O2SAT 94.0 11/06/2021 0925    - Abd: ND - Extremity: warm  .Intake/Output      01/23 0701 01/24 0700 01/24 0701 01/25 0700   P.O. 630    I.V. (mL/kg)     Total Intake(mL/kg) 630 (7.2)    Urine (mL/kg/hr) 1450 (0.7)    Stool     Chest Tube 470    Total Output 1920    Net -1290         Urine Occurrence 1 x       _______________________________________________________________ Labs: CBC Latest Ref Rng & Units 11/11/2021 11/10/2021 11/09/2021  WBC 4.0 - 10.5 K/uL 11.2(H) 8.1 8.4  Hemoglobin 13.0 - 17.0 g/dL 9.8(L) 10.6(L) 10.3(L)  Hematocrit 39.0 - 52.0 % 28.8(L) 30.3(L) 30.7(L)  Platelets 150 - 400 K/uL 126(L) 115(L) 102(L)   CMP Latest Ref Rng & Units 11/11/2021 11/10/2021 11/09/2021  Glucose 70 - 99 mg/dL 112(H) 109(H) 98  BUN 8 - 23 mg/dL 14 15 16   Creatinine 0.61 - 1.24 mg/dL 0.98 1.03 1.09  Sodium 135 - 145 mmol/L 130(L) 132(L) 134(L)  Potassium 3.5 - 5.1 mmol/L 4.0 3.9 4.0  Chloride 98 - 111 mmol/L 96(L) 100 102  CO2 22 - 32 mmol/L 24 23 22   Calcium 8.9 - 10.3 mg/dL 8.4(L) 8.7(L) 8.3(L)  Total Protein 6.5 - 8.1 g/dL - - -  Total Bilirubin 0.3 - 1.2 mg/dL - - -   Alkaline Phos 38 - 126 U/L - - -  AST 15 - 41 U/L - - -  ALT 0 - 44 U/L - - -    CXR: pneumoT improved.    _______________________________________________________________  Assessment and Plan: POD 7 s/p AVR CABG, POD 6 s/p re-exploration, POD 2 s/p PPM  Neuro: pain controlled CV: PPM in place.  Will titrate BP meds.  Increasing BB  On A/S.  Will remove epicardial wires Pulm: will keep Cts.  Pulm hygiene Renal: creat stable GI: on diet Heme: stable ID: [email or web address] Endo: SSI Dispo: continue ICU care.  CIR consult placed   Chelsae Zanella O Tamarah Bhullar 11/11/2021 7:14 AM

## 2021-11-11 NOTE — Progress Notes (Signed)
Electrophysiology Rounding Note  Patient Name: Joshua [name] Date of Encounter: 11/11/2021  Primary Cardiologist: None Electrophysiologist: Vickie Epley, MD   Subjective   NAEO. Breathing better and ptx improving with chest tubes.  Inpatient Medications    Scheduled Meds:  aspirin EC  325 mg Oral Daily   Or   aspirin  324 mg Per Tube Daily   atorvastatin  40 mg Per Tube Daily   bisacodyl  10 mg Oral Daily   Or   bisacodyl  10 mg Rectal Daily   Chlorhexidine Gluconate Cloth  6 each Topical Daily   docusate sodium  200 mg Oral Daily   lisinopril  20 mg Oral Daily   mouth rinse  15 mL Mouth Rinse BID   metoprolol tartrate  25 mg Oral BID   pantoprazole  40 mg Oral Daily   potassium chloride  20 mEq Oral Daily   sodium chloride flush  3 mL Intravenous Q12H   Continuous Infusions:  PRN Meds: acetaminophen, levalbuterol, morphine injection, ondansetron (ZOFRAN) IV, oxyCODONE, sodium chloride flush, traMADol, zolpidem   Vital Signs    Vitals:   11/11/21 0600 11/11/21 0608 11/11/21 0700 11/11/21 0800  BP: (!) 151/86  (!) 130/93 131/78  Pulse: 90  90 88  Resp:   (!) 22 10  Temp:    98.6 F (37 C)  TempSrc:    Oral  SpO2: 95%  94% 93%  Weight:  87.4 kg    Height:        Intake/Output Summary (Last 24 hours) at 11/11/2021 0928 Last data filed at 11/11/2021 0800 Gross per 24 hour  Intake 630 ml  Output 1660 ml  Net -1030 ml   Filed Weights   11/07/21 0500 11/08/21 0530 11/11/21 0608  Weight: 98.7 kg 93.4 kg 87.4 kg    Physical Exam    GEN- The patient is well appearing, alert and oriented x 3 today.   Head- normocephalic, atraumatic Eyes-  Sclera clear, conjunctiva pink Ears- hearing intact Oropharynx- clear Neck- supple Lungs- Clear to ausculation bilaterally, normal work of breathing Heart- Regular rate and rhythm, no murmurs, rubs or gallops GI- soft, NT, ND, + BS Extremities- no clubbing or cyanosis. No edema Skin- no rash or lesion Psych-  euthymic mood, full affect Neuro- strength and sensation are intact  Labs    CBC Recent Labs    11/10/21 0134 11/11/21 0104  WBC 8.1 11.2*  HGB 10.6* 9.8*  HCT 30.3* 28.8*  MCV 86.3 87.0  PLT 115* 026*   Basic Metabolic Panel Recent Labs    11/10/21 0134 11/11/21 0104  NA 132* 130*  K 3.9 4.0  CL 100 96*  CO2 23 24  GLUCOSE 109* 112*  BUN 15 14  CREATININE 1.03 0.98  CALCIUM 8.7* 8.4*   Liver Function Tests No results for input(s): AST, ALT, ALKPHOS, BILITOT, PROT, ALBUMIN in the last 72 hours. No results for input(s): LIPASE, AMYLASE in the last 72 hours. Cardiac Enzymes No results for input(s): CKTOTAL, CKMB, CKMBINDEX, TROPONINI in the last 72 hours.   Telemetry    AS/VP 80-90s (personally reviewed)  Radiology    EP PPM/ICD IMPLANT  Result Date: 11/09/2021  CONCLUSIONS:  1. Symptomatic complete heart block  2. Successful dual chamber permanent pacemaker implantation  3.  No early apparent complications.   DG Chest 1V REPEAT Same Day  Result Date: 11/10/2021 CLINICAL DATA:  86 year old male with history of bilateral chest tube placement. EXAM: CHEST - 1 VIEW  SAME DAY COMPARISON:  Chest x-ray 11/10/2021. FINDINGS: Bilateral chest tubes are now in position. Left pigtail reformed over the lower left hemithorax. Right pigtail partially reformed projecting over the mid to upper right hemithorax. Previously noted left-sided pneumothorax persists, still moderate in size. Previously noted right-sided pneumothorax also persists, small to moderate in size. Bibasilar opacities are noted which may reflect areas of atelectasis and/or consolidation. Blunting of the costophrenic sulci bilaterally is also noted, suggesting the presence of pleural fluid bilaterally. No evidence of pulmonary edema. Heart size is normal. Upper mediastinal contours are within normal limits. A small amount of pneumomediastinum is also evident. Atherosclerotic calcifications are noted in the thoracic  aorta. Status post median sternotomy for CABG and aortic valve replacement. Left-sided pacemaker device in place with lead tips projecting over the expected location of the right atrium and right ventricle. IMPRESSION: 1. Status post placement of small bore bilateral chest tubes with persistent bilateral hydropneumothoraces, with moderate pneumothorax components bilaterally (these have decreased, but not yet resolved). Electronically Signed   By: Vinnie Langton M.D.   On: 11/10/2021 06:57   DG CHEST PORT 1 VIEW  Result Date: 11/11/2021 CLINICAL DATA:  Follow up pneumothorax. History of aortic valve replacement one week ago. EXAM: PORTABLE CHEST 1 VIEW COMPARISON:  Radiographs 11/10/2021.  CT 10/31/2021. FINDINGS: 0508 hours. Bilateral pigtail chest tubes remain in place with better formation of the right-sided catheter. No residual right-sided pneumothorax identified. There is a minimal residual left-sided pneumothorax. The heart size and mediastinal contours are stable status post CABG and aortic valve replacement. There are persistent small bilateral pleural effusions and mild bibasilar atelectasis. Left subclavian pacemaker leads appear unchanged. Skin staples are in place. IMPRESSION: Interval resolution of previously demonstrated right pneumothorax with improvement in the left-sided pneumothorax. No other significant changes. Electronically Signed   By: Richardean Sale M.D.   On: 11/11/2021 08:08   DG CHEST PORT 1 VIEW  Addendum Date: 11/10/2021   ADDENDUM REPORT: 11/10/2021 04:45 ADDENDUM: Critical findings were reported to Dr. Roxan Hockey at 443 a.m. Electronically Signed   By: Brett Fairy M.D.   On: 11/10/2021 04:45   Result Date: 11/10/2021 CLINICAL DATA:  Encounter for wheezing. EXAM: PORTABLE CHEST 1 VIEW COMPARISON:  11/08/2021. FINDINGS: The heart is enlarged and there is evidence of prior cardiothoracic surgery. A prosthetic cardiac valve is noted. Sternotomy wires are present over the  midline. A left pacemaker is noted. There is a moderate pneumothorax on the left a large pneumothorax on the right with atelectasis and partial lung collapse. No definite effusion. No acute osseous abnormality. IMPRESSION: 1. Large pneumothorax on the right and moderate pneumothorax on the left with bilateral atelectasis and partial lung collapse. 2. Cardiomegaly. Electronically Signed: By: Brett Fairy M.D. On: 11/10/2021 04:38    Patient Profile        86 y.o. male with CAD and severe AS s/p CABG and AVR on 1/17/12023 who has developed complete heart block requiring pacing support. Permanent pacing will be required.  Assessment & Plan    Complete heart block S/p MDT DDD PPM 1/22 CXR (1 view) with stable appearing leads Wound care and arm restrictions to be reviewed. Usual follow up in place.  2. CAD s/p CABG - management per primary   3. Pnuemothoraces Appreciate TCTS management. Now s/p bilateral chest tubes with improvement.   For questions or updates, please contact [place] Please consult [email or web address] for contact info under Cardiology/STEMI.  Signed, Shirley Friar, PA-C  11/11/2021, 9:28 AM

## 2021-11-11 NOTE — Progress Notes (Signed)
Progress Note  Patient Name: Joshua [name] Date of Encounter: 11/11/2021  [facility] Cardiologist: Audie Box  Subjective   Sleeping comfortably, sitting up in chair.  Bilateral chest tube still in.  Denies pain. 100% atrial sensed, ventricular paced rhythm. Small residual L pneumothorax, none on R.  Inpatient Medications    Scheduled Meds:  aspirin EC  325 mg Oral Daily   Or   aspirin  324 mg Per Tube Daily   atorvastatin  40 mg Per Tube Daily   bisacodyl  10 mg Oral Daily   Or   bisacodyl  10 mg Rectal Daily   Chlorhexidine Gluconate Cloth  6 each Topical Daily   docusate sodium  200 mg Oral Daily   lisinopril  20 mg Oral Daily   mouth rinse  15 mL Mouth Rinse BID   metoprolol tartrate  25 mg Oral BID   pantoprazole  40 mg Oral Daily   potassium chloride  20 mEq Oral Daily   sodium chloride flush  3 mL Intravenous Q12H   Continuous Infusions:  PRN Meds: acetaminophen, levalbuterol, morphine injection, ondansetron (ZOFRAN) IV, oxyCODONE, sodium chloride flush, traMADol, zolpidem   Vital Signs    Vitals:   11/11/21 0500 11/11/21 0600 11/11/21 0608 11/11/21 0700  BP: (!) 150/89 (!) 151/86  (!) 130/93  Pulse: 91 90  90  Resp:    (!) 22  Temp:      TempSrc:      SpO2: 94% 95%  94%  Weight:   87.4 kg   Height:        Intake/Output Summary (Last 24 hours) at 11/11/2021 0808 Last data filed at 11/11/2021 0612 Gross per 24 hour  Intake 390 ml  Output 1660 ml  Net -1270 ml   Last 3 Weights 11/11/2021 11/08/2021 11/07/2021  Weight (lbs) 192 lb 9.6 oz 205 lb 14.6 oz 217 lb 9.5 oz  Weight (kg) 87.363 kg 93.4 kg 98.7 kg      Telemetry    A sensed (sinus) V-paced - Personally Reviewed  ECG    A sensed (sinus) V-paced - Personally Reviewed  Physical Exam  Bilateral chest tubes. Healthy left pacemaker site GEN: No acute distress.   Neck: No JVD Cardiac: RRR, no murmurs, or gallops. Pericardial rub is much fainter today Respiratory: Clear to auscultation  bilaterally. GI: Soft, nontender, non-distended  MS: No edema; No deformity. Neuro:  Nonfocal  Psych: Normal affect   Labs    High Sensitivity Troponin:  No results for input(s): TROPONINIHS in the last 720 hours.   Chemistry Recent Labs  Lab 11/05/21 1047 11/05/21 1511 11/05/21 1649 11/06/21 0312 11/06/21 0716 11/09/21 0557 11/10/21 0134 11/11/21 0104  NA 144   < > 140 141   < > 134* 132* 130*  K 3.7   < > 3.9 3.7   < > 4.0 3.9 4.0  CL 103  --  103 107   < > 102 100 96*  CO2 22  --  24 26   < > 22 23 24   GLUCOSE 177*  --  134* 136*   < > 98 109* 112*  BUN 10  --  11 12   < > 16 15 14   CREATININE 1.25*  --  1.30* 1.38*   < > 1.09 1.03 0.98  CALCIUM 7.4*  --  7.3* 7.1*   < > 8.3* 8.7* 8.4*  MG 2.4  --  2.1 2.2  --   --   --   --  GFRNONAA 57*  --  55* 51*   < > >60 >60 >60  ANIONGAP 19*  --  13 8   < > 10 9 10    < > = values in this interval not displayed.    Lipids No results for input(s): CHOL, TRIG, HDL, LABVLDL, LDLCALC, CHOLHDL in the last 168 hours.  Hematology Recent Labs  Lab 11/09/21 0557 11/10/21 0134 11/11/21 0104  WBC 8.4 8.1 11.2*  RBC 3.49* 3.51* 3.31*  HGB 10.3* 10.6* 9.8*  HCT 30.7* 30.3* 28.8*  MCV 88.0 86.3 87.0  MCH 29.5 30.2 29.6  MCHC 33.6 35.0 34.0  RDW 19.0* 18.6* 18.5*  PLT 102* 115* 126*   Thyroid No results for input(s): TSH, FREET4 in the last 168 hours.  BNPNo results for input(s): BNP, PROBNP in the last 168 hours.  DDimer No results for input(s): DDIMER in the last 168 hours.   Radiology    EP PPM/ICD IMPLANT  Result Date: 11/09/2021  CONCLUSIONS:  1. Symptomatic complete heart block  2. Successful dual chamber permanent pacemaker implantation  3.  No early apparent complications.   DG Chest 1V REPEAT Same Day  Result Date: 11/10/2021 CLINICAL DATA:  86 year old male with history of bilateral chest tube placement. EXAM: CHEST - 1 VIEW SAME DAY COMPARISON:  Chest x-ray 11/10/2021. FINDINGS: Bilateral chest tubes are now in  position. Left pigtail reformed over the lower left hemithorax. Right pigtail partially reformed projecting over the mid to upper right hemithorax. Previously noted left-sided pneumothorax persists, still moderate in size. Previously noted right-sided pneumothorax also persists, small to moderate in size. Bibasilar opacities are noted which may reflect areas of atelectasis and/or consolidation. Blunting of the costophrenic sulci bilaterally is also noted, suggesting the presence of pleural fluid bilaterally. No evidence of pulmonary edema. Heart size is normal. Upper mediastinal contours are within normal limits. A small amount of pneumomediastinum is also evident. Atherosclerotic calcifications are noted in the thoracic aorta. Status post median sternotomy for CABG and aortic valve replacement. Left-sided pacemaker device in place with lead tips projecting over the expected location of the right atrium and right ventricle. IMPRESSION: 1. Status post placement of small bore bilateral chest tubes with persistent bilateral hydropneumothoraces, with moderate pneumothorax components bilaterally (these have decreased, but not yet resolved). Electronically Signed   By: Vinnie Langton M.D.   On: 11/10/2021 06:57   DG CHEST PORT 1 VIEW  Addendum Date: 11/10/2021   ADDENDUM REPORT: 11/10/2021 04:45 ADDENDUM: Critical findings were reported to Dr. Roxan Hockey at 443 a.m. Electronically Signed   By: Brett Fairy M.D.   On: 11/10/2021 04:45   Result Date: 11/10/2021 CLINICAL DATA:  Encounter for wheezing. EXAM: PORTABLE CHEST 1 VIEW COMPARISON:  11/08/2021. FINDINGS: The heart is enlarged and there is evidence of prior cardiothoracic surgery. A prosthetic cardiac valve is noted. Sternotomy wires are present over the midline. A left pacemaker is noted. There is a moderate pneumothorax on the left a large pneumothorax on the right with atelectasis and partial lung collapse. No definite effusion. No acute osseous  abnormality. IMPRESSION: 1. Large pneumothorax on the right and moderate pneumothorax on the left with bilateral atelectasis and partial lung collapse. 2. Cardiomegaly. Electronically Signed: By: Brett Fairy M.D. On: 11/10/2021 04:38    Cardiac Studies   Intraop TEE 11/05/2021  POST-OP IMPRESSIONS  _ Left Ventricle: has normal systolic function, with an ejection fraction  of  60%. The cavity size was decreased. The wall motion is normal.  _ Right  Ventricle: The right ventricle appears unchanged from pre-bypass.  _ Aorta: The aorta appears unchanged from pre-bypass.  _ Left Atrial Appendage: The left atrial appendage appears unchanged from  pre-bypass.  _ Aortic Valve: No stenosis present. No regurgitation post repair.  Perivalvular  leak noted.s/p 2mm Inspiris valve with small PVL noted and discussed with  surgeon.  _ Mitral Valve: The mitral valve appears unchanged from pre-bypass.  _ Tricuspid Valve: The tricuspid valve appears unchanged from pre-bypass.  _ Pulmonic Valve: The pulmonic valve appears unchanged from pre-bypass.  _ Interatrial Septum: The interatrial septum appears unchanged from  pre-bypass.  _ Pericardium: The pericardium appears unchanged from pre-bypass.   TTEcho 10/08/2022   1. The aortic valve was not well visualized. Aortic valve regurgitation  is not visualized. Severe aortic valve stenosis. Aortic valve area, by VTI  measures 0.90 cm. Aortic valve mean gradient measures 58.0 mmHg. Aortic  valve Vmax measures 4.89 m/s.   2. Left ventricular ejection fraction, by estimation, is 60 to 65%. The  left ventricle has normal function. The left ventricle has no regional  wall motion abnormalities. There is mild concentric left ventricular  hypertrophy. Left ventricular diastolic  parameters are consistent with Grade I diastolic dysfunction (impaired  relaxation). There is a small intracavitary < 20 mmHg with Valsalva.   3. Right ventricular systolic function  is normal. The right ventricular  size is normal. There is normal pulmonary artery systolic pressure.   4. Left atrial size was mildly dilated.   5. The mitral valve is degenerative. No evidence of mitral valve  regurgitation.   Comparison(s): EF 50%, AS mean gradient 28 mmHG, peak 46 mmHg. Interval  increase to severe aortic stenosis.   Patient Profile     86 y.o. male day #7 s/p AVR/CABG (01/17 23 mm Inspiris valve, LIMA to LAD, SVG to PDA, SVG to OM) for severe AS + 99% mid RCA, 70% calcified ostial LAD), day #6 s/p reexploration, day #3 s/p dual-chamber permanent pacemaker, bilateral chest tubes placed  yesterday for bilateral pneumothoraces.  Assessment & Plan    AS s/p AVR: Normal valve function by physical exam, intraop TEE. CAD s/p CABG: LIMA to LAD, SVG to PDA, SVG to OM. Asymptomatic. CHB: pacemaker dependent S/P PPM: Normal device function. Bilateral pneumo: Resolved on R, greatly improved on L.          For questions or updates, please contact [place] Please consult [email or web address] for contact info under        Signed, Sanda Klein, MD  11/11/2021, 8:08 AM

## 2021-11-12 ENCOUNTER — Inpatient Hospital Stay (HOSPITAL_COMMUNITY): Payer: Medicare HMO

## 2021-11-12 DIAGNOSIS — I350 Nonrheumatic aortic (valve) stenosis: Secondary | ICD-10-CM | POA: Diagnosis not present

## 2021-11-12 LAB — CBC
HCT: 30.8 % — ABNORMAL LOW (ref 39.0–52.0)
Hemoglobin: 10.5 g/dL — ABNORMAL LOW (ref 13.0–17.0)
MCH: 29.7 pg (ref 26.0–34.0)
MCHC: 34.1 g/dL (ref 30.0–36.0)
MCV: 87.3 fL (ref 80.0–100.0)
Platelets: 142 10*3/uL — ABNORMAL LOW (ref 150–400)
RBC: 3.53 MIL/uL — ABNORMAL LOW (ref 4.22–5.81)
RDW: 18.3 % — ABNORMAL HIGH (ref 11.5–15.5)
WBC: 10.7 10*3/uL — ABNORMAL HIGH (ref 4.0–10.5)
nRBC: 0 % (ref 0.0–0.2)

## 2021-11-12 LAB — BASIC METABOLIC PANEL
Anion gap: 11 (ref 5–15)
BUN: 14 mg/dL (ref 8–23)
CO2: 24 mmol/L (ref 22–32)
Calcium: 8.5 mg/dL — ABNORMAL LOW (ref 8.9–10.3)
Chloride: 94 mmol/L — ABNORMAL LOW (ref 98–111)
Creatinine, Ser: 0.94 mg/dL (ref 0.61–1.24)
GFR, Estimated: >60 mL/min (ref >60)
Glucose, Bld: 107 mg/dL — ABNORMAL HIGH (ref 70–99)
Potassium: 3.6 mmol/L (ref 3.5–5.1)
Sodium: 129 mmol/L — ABNORMAL LOW (ref 135–145)

## 2021-11-12 LAB — PHOSPHORUS: Phosphorus: 3.5 mg/dL (ref 2.5–4.6)

## 2021-11-12 LAB — MAGNESIUM: Magnesium: 1.6 mg/dL — ABNORMAL LOW (ref 1.7–2.4)

## 2021-11-12 MED ORDER — SODIUM CHLORIDE 0.9% FLUSH: 3.0000 mL | INTRAVENOUS | Status: DC | PRN

## 2021-11-12 MED ORDER — MAGNESIUM OXIDE -MG SUPPLEMENT 400 (240 MG) MG PO TABS
400.0000 mg | ORAL_TABLET | Freq: Two times a day (BID) | ORAL | Status: AC
  Administered 2021-11-12 – 2021-11-14 (×6): 400 mg via ORAL
  Filled 2021-11-12 (×6): qty 1

## 2021-11-12 MED ORDER — SODIUM CHLORIDE 0.9 % IV SOLN: 250.0000 mL | INTRAVENOUS | Status: DC | PRN

## 2021-11-12 MED ORDER — FUROSEMIDE 40 MG PO TABS
40.0000 mg | ORAL_TABLET | Freq: Every day | ORAL | Status: DC
  Administered 2021-11-12 – 2021-11-16 (×5): 40 mg via ORAL
  Filled 2021-11-12 (×5): qty 1

## 2021-11-12 MED ORDER — POTASSIUM CHLORIDE CRYS ER 20 MEQ PO TBCR
40.0000 meq | EXTENDED_RELEASE_TABLET | Freq: Two times a day (BID) | ORAL | Status: AC
  Administered 2021-11-12 (×2): 40 meq via ORAL
  Filled 2021-11-12 (×2): qty 2

## 2021-11-12 MED ORDER — ~~LOC~~ CARDIAC SURGERY, PATIENT & FAMILY EDUCATION: Freq: Once | Status: AC

## 2021-11-12 MED ORDER — SODIUM CHLORIDE 0.9% FLUSH
3.0000 mL | Freq: Two times a day (BID) | INTRAVENOUS | Status: DC
  Administered 2021-11-12 – 2021-11-21 (×16): 3 mL via INTRAVENOUS

## 2021-11-12 NOTE — Progress Notes (Signed)
°  EP to follow at a distance.   Please call with questions.   Legrand Como [address] [place], PA-C  11/12/2021 9:19 AM

## 2021-11-12 NOTE — Progress Notes (Signed)
Progress Note  Patient Name: Joshua [name] Date of Encounter: 11/12/2021  [facility] Cardiologist: Audie Box  Subjective   Denies pain. Walked 160 ft. No pneumo on R, tiny residual on L. 100% V paced (mostly A sensed v paced)  Inpatient Medications    Scheduled Meds:  aspirin EC  325 mg Oral Daily   Or   aspirin  324 mg Per Tube Daily   atorvastatin  40 mg Per Tube Daily   bisacodyl  10 mg Oral Daily   Or   bisacodyl  10 mg Rectal Daily   Chlorhexidine Gluconate Cloth  6 each Topical Daily   docusate sodium  200 mg Oral Daily   furosemide  40 mg Oral Daily   lisinopril  20 mg Oral Daily   magnesium oxide  400 mg Oral BID   metoprolol tartrate  25 mg Oral BID   pantoprazole  40 mg Oral Daily   potassium chloride  40 mEq Oral BID   sodium chloride flush  3 mL Intravenous Q12H   Continuous Infusions:  PRN Meds: acetaminophen, levalbuterol, morphine injection, ondansetron (ZOFRAN) IV, oxyCODONE, sodium chloride flush, traMADol, zolpidem   Vital Signs    Vitals:   11/12/21 0400 11/12/21 0500 11/12/21 0600 11/12/21 0700  BP: 137/72 136/69 135/74 104/89  Pulse: 84 80 87 88  Resp: 15 18 18 14   Temp:      TempSrc:      SpO2: 95% 93% 98% 93%  Weight:   85.6 kg   Height:        Intake/Output Summary (Last 24 hours) at 11/12/2021 0756 Last data filed at 11/12/2021 1610 Gross per 24 hour  Intake 420 ml  Output 2350 ml  Net -1930 ml   Last 3 Weights 11/12/2021 11/11/2021 11/08/2021  Weight (lbs) 188 lb 11.2 oz 192 lb 9.6 oz 205 lb 14.6 oz  Weight (kg) 85.594 kg 87.363 kg 93.4 kg      Telemetry    A sensed (sinus) V paced  - Personally Reviewed  ECG    No new tracing - Personally Reviewed  Physical Exam  Comfortable, alert, sitting in chair GEN: No acute distress.   Neck: No JVD Cardiac: RRR, crisp S2, pericardial rub resolved, no murmurs or gallops. Bilateral chest tubes Respiratory: Clear to auscultation bilaterally. GI: Soft, nontender,  non-distended  MS: No edema; No deformity. Neuro:  Nonfocal  Psych: Normal affect   Labs    High Sensitivity Troponin:  No results for input(s): TROPONINIHS in the last 720 hours.   Chemistry Recent Labs  Lab 11/05/21 1649 11/06/21 0312 11/06/21 0716 11/10/21 0134 11/11/21 0104 11/12/21 0053  NA 140 141   < > 132* 130* 129*  K 3.9 3.7   < > 3.9 4.0 3.6  CL 103 107   < > 100 96* 94*  CO2 24 26   < > 23 24 24   GLUCOSE 134* 136*   < > 109* 112* 107*  BUN 11 12   < > 15 14 14   CREATININE 1.30* 1.38*   < > 1.03 0.98 0.94  CALCIUM 7.3* 7.1*   < > 8.7* 8.4* 8.5*  MG 2.1 2.2  --   --   --  1.6*  GFRNONAA 55* 51*   < > >60 >60 >60  ANIONGAP 13 8   < > 9 10 11    < > = values in this interval not displayed.    Lipids No results for input(s): CHOL, TRIG, HDL,  LABVLDL, LDLCALC, CHOLHDL in the last 168 hours.  Hematology Recent Labs  Lab 11/10/21 0134 11/11/21 0104 11/12/21 0053  WBC 8.1 11.2* 10.7*  RBC 3.51* 3.31* 3.53*  HGB 10.6* 9.8* 10.5*  HCT 30.3* 28.8* 30.8*  MCV 86.3 87.0 87.3  MCH 30.2 29.6 29.7  MCHC 35.0 34.0 34.1  RDW 18.6* 18.5* 18.3*  PLT 115* 126* 142*   Thyroid No results for input(s): TSH, FREET4 in the last 168 hours.  BNPNo results for input(s): BNP, PROBNP in the last 168 hours.  DDimer No results for input(s): DDIMER in the last 168 hours.   Radiology    DG CHEST PORT 1 VIEW  Result Date: 11/11/2021 CLINICAL DATA:  Follow up pneumothorax. History of aortic valve replacement one week ago. EXAM: PORTABLE CHEST 1 VIEW COMPARISON:  Radiographs 11/10/2021.  CT 10/31/2021. FINDINGS: 0508 hours. Bilateral pigtail chest tubes remain in place with better formation of the right-sided catheter. No residual right-sided pneumothorax identified. There is a minimal residual left-sided pneumothorax. The heart size and mediastinal contours are stable status post CABG and aortic valve replacement. There are persistent small bilateral pleural effusions and mild bibasilar  atelectasis. Left subclavian pacemaker leads appear unchanged. Skin staples are in place. IMPRESSION: Interval resolution of previously demonstrated right pneumothorax with improvement in the left-sided pneumothorax. No other significant changes. Electronically Signed   By: Richardean Sale M.D.   On: 11/11/2021 08:08    Cardiac Studies   Intraop TEE 11/05/2021   POST-OP IMPRESSIONS  _ Left Ventricle: has normal systolic function, with an ejection fraction  of  60%. The cavity size was decreased. The wall motion is normal.  _ Right Ventricle: The right ventricle appears unchanged from pre-bypass.  _ Aorta: The aorta appears unchanged from pre-bypass.  _ Left Atrial Appendage: The left atrial appendage appears unchanged from  pre-bypass.  _ Aortic Valve: No stenosis present. No regurgitation post repair.  Perivalvular  leak noted.s/p 56mm Inspiris valve with small PVL noted and discussed with  surgeon.  _ Mitral Valve: The mitral valve appears unchanged from pre-bypass.  _ Tricuspid Valve: The tricuspid valve appears unchanged from pre-bypass.  _ Pulmonic Valve: The pulmonic valve appears unchanged from pre-bypass.  _ Interatrial Septum: The interatrial septum appears unchanged from  pre-bypass.  _ Pericardium: The pericardium appears unchanged from pre-bypass.    TTEcho 10/08/2022    1. The aortic valve was not well visualized. Aortic valve regurgitation  is not visualized. Severe aortic valve stenosis. Aortic valve area, by VTI  measures 0.90 cm. Aortic valve mean gradient measures 58.0 mmHg. Aortic  valve Vmax measures 4.89 m/s.   2. Left ventricular ejection fraction, by estimation, is 60 to 65%. The  left ventricle has normal function. The left ventricle has no regional  wall motion abnormalities. There is mild concentric left ventricular  hypertrophy. Left ventricular diastolic  parameters are consistent with Grade I diastolic dysfunction (impaired  relaxation). There is a  small intracavitary < 20 mmHg with Valsalva.   3. Right ventricular systolic function is normal. The right ventricular  size is normal. There is normal pulmonary artery systolic pressure.   4. Left atrial size was mildly dilated.   5. The mitral valve is degenerative. No evidence of mitral valve  regurgitation.   Comparison(s): EF 50%, AS mean gradient 28 mmHG, peak 46 mmHg. Interval  increase to severe aortic stenosis.   Patient Profile     86 y.o. male day #8 s/p AVR/CABG (11/04/2021, 23 mm Inspiris valve,  LIMA to LAD, SVG to PDA, SVG to OM) for severe AS + 99% mid RCA, 70% calcified ostial LAD), day #7 s/p reexploration, day #4 s/p dual-chamber permanent pacemaker, bilateral chest tubes placed  01/22/20232 for bilateral pneumothoraces.  Assessment & Plan    AS s/p AVR: Normal valve function by physical exam, intraop TEE. CAD s/p CABG: LIMA to LAD, SVG to PDA, SVG to OM. Asymptomatic. CHB: pacemaker dependent S/P PPM: Normal device function. Bilateral pneumo: Resolved on R, minimal residual L.          For questions or updates, please contact [place] Please consult [email or web address] for contact info under        Signed, Sanda Klein, MD  11/12/2021, 7:57 AM

## 2021-11-12 NOTE — Progress Notes (Signed)
Patient ID: Joshua [name], male   DOB: October 22, 1935, 86 y.o.   MRN: [id number] [facility] Evening Rounds:  Hemodynamically stable in sinus rhythm.  Sats 95% RA  Good urine output Had BM today. Awaiting stepdown bed.

## 2021-11-12 NOTE — Plan of Care (Signed)
  Problem: Education: Goal: Understanding of CV disease, CV risk reduction, and recovery process will improve Outcome: Progressing Goal: Individualized Educational Video(s) Outcome: Progressing   Problem: Activity: Goal: Ability to return to baseline activity level will improve Outcome: Progressing   Problem: Cardiovascular: Goal: Ability to achieve and maintain adequate cardiovascular perfusion will improve Outcome: Progressing Goal: Vascular access site(s) Level 0-1 will be maintained Outcome: Progressing   Problem: Health Behavior/Discharge Planning: Goal: Ability to safely manage health-related needs after discharge will improve Outcome: Progressing   Problem: Education: Goal: Knowledge of General Education information will improve Description: Including pain rating scale, medication(s)/side effects and non-pharmacologic comfort measures Outcome: Progressing   Problem: Health Behavior/Discharge Planning: Goal: Ability to manage health-related needs will improve Outcome: Progressing   Problem: Clinical Measurements: Goal: Ability to maintain clinical measurements within normal limits will improve Outcome: Progressing Goal: Will remain free from infection Outcome: Progressing Goal: Diagnostic test results will improve Outcome: Progressing Goal: Respiratory complications will improve Outcome: Progressing Goal: Cardiovascular complication will be avoided Outcome: Progressing   Problem: Activity: Goal: Risk for activity intolerance will decrease Outcome: Progressing   Problem: Nutrition: Goal: Adequate nutrition will be maintained Outcome: Progressing   Problem: Coping: Goal: Level of anxiety will decrease Outcome: Progressing   Problem: Elimination: Goal: Will not experience complications related to bowel motility Outcome: Progressing Goal: Will not experience complications related to urinary retention Outcome: Progressing   Problem: Pain Managment: Goal:  General experience of comfort will improve Outcome: Progressing   Problem: Safety: Goal: Ability to remain free from injury will improve Outcome: Progressing   Problem: Skin Integrity: Goal: Risk for impaired skin integrity will decrease Outcome: Progressing   Problem: Education: Goal: Will demonstrate proper wound care and an understanding of methods to prevent future damage Outcome: Progressing Goal: Knowledge of disease or condition will improve Outcome: Progressing Goal: Knowledge of the prescribed therapeutic regimen will improve Outcome: Progressing Goal: Individualized Educational Video(s) Outcome: Progressing   Problem: Activity: Goal: Risk for activity intolerance will decrease Outcome: Progressing   Problem: Cardiac: Goal: Will achieve and/or maintain hemodynamic stability Outcome: Progressing   Problem: Clinical Measurements: Goal: Postoperative complications will be avoided or minimized Outcome: Progressing   Problem: Respiratory: Goal: Respiratory status will improve Outcome: Progressing   Problem: Skin Integrity: Goal: Wound healing without signs and symptoms of infection Outcome: Progressing Goal: Risk for impaired skin integrity will decrease Outcome: Progressing   Problem: Urinary Elimination: Goal: Ability to achieve and maintain adequate renal perfusion and functioning will improve Outcome: Progressing   

## 2021-11-12 NOTE — Progress Notes (Signed)
Physical Therapy Treatment Patient Details Name: Joshua [name] MRN: [id number] DOB: [birth year] Today's Date: 11/12/2021   History of Present Illness Pt is an 86 y.o. male with severe 3-vessel heart disease, admitted 11/04/21 for CABG x3 and AVR. Post-op course complicated by acute blood loss anemia requiring mediastinal re-exploration 1/18. ETT 1/17-1/19. Pt with complete heart block s/p temporary pacemaker insertion 1/20. Pt with permanent pacer placed 1/22. Pt developed bil pneumothorax on 1/23 and bil chest tubes placed.  PMH inlcudes CAD, severe AS, HTN, HLD, arthritis, GERD, cataracts.    PT Comments    Pt admitted with above diagnosis. Pt was able to progress ambulation in hallway with Joshua [name] walker but does need continued assist and cues for safety.  Pt incr distance for ambulation however does flex at trunk and knees and needs continued strengthening.   Pt currently with functional limitations due to balance and endurance deficits. Pt will benefit from skilled PT to increase their independence and safety with mobility to allow discharge to the venue listed below.      Recommendations for follow up therapy are one component of a multi-disciplinary discharge planning process, led by the attending physician.  Recommendations may be updated based on patient status, additional functional criteria and insurance authorization.  Follow Up Recommendations  Acute inpatient rehab (3hours/day)     Assistance Recommended at Discharge Intermittent Supervision/Assistance  Patient can return home with the following Two people to help with walking and/or transfers;Help with stairs or ramp for entrance   Equipment Recommendations  None recommended by PT    Recommendations for Other Services Rehab consult     Precautions / Restrictions Precautions Precautions: Fall;Sternal;ICD/Pacemaker Precaution Comments: bilateral chest tubes Restrictions Other Position/Activity Restrictions: sternal      Mobility  Bed Mobility Overal bed mobility: Needs Assistance Bed Mobility: Sit to Sidelying         Sit to sidelying: Min assist, Mod assist General bed mobility comments: Assist to bring legs onto bed, and for technique to get back in bed following sternal precations.    Transfers Overall transfer level: Needs assistance Equipment used:  Joshua [name] walker) Transfers: Sit to/from Stand Sit to Stand: Mod assist, From elevated surface, Min assist           General transfer comment: stood with Mod A to power up with cues for hands on knees to stand.    Ambulation/Gait Ambulation/Gait assistance: Min assist Gait Distance (Feet): 160 Feet Assistive device: Ethelene Hal Gait Pattern/deviations: Decreased step length - right, Decreased step length - left, Decreased stride length, Decreased weight shift to right, Decreased weight shift to left, Knee flexed in stance - right, Knee flexed in stance - left, Drifts right/left, Trunk flexed   Gait velocity interpretation: <1.31 ft/sec, indicative of household ambulator   General Gait Details: Pt needed cues for upright posture as he tends to flex trunk and bil knees as well. Pt needed cues to stand up tall constantly. Pt also needed cues to incr step length as he shuffles his feet at times. Pt was able to progress his gait but does need assist at present time.   Stairs             Wheelchair Mobility    Modified Rankin (Stroke Patients Only)       Balance Overall balance assessment: Needs assistance Sitting-balance support: Feet supported, No upper extremity supported Sitting balance-Leahy Scale: Fair Sitting balance - Comments: min guard assist to sit EOB.   Standing balance support: Bilateral upper  extremity supported Standing balance-Leahy Scale: Poor Standing balance comment: + 1 min to min guard assist for static standing with UE support                            Cognition Arousal/Alertness:  Awake/alert Behavior During Therapy: WFL for tasks assessed/performed Overall Cognitive Status: Within Functional Limits for tasks assessed                                          Exercises General Exercises - Lower Extremity Ankle Circles/Pumps: AROM, Both, 10 reps, Supine Long Arc Quad: AROM, Both, 10 reps, Seated    General Comments General comments (skin integrity, edema, etc.): VSS      Pertinent Vitals/Pain Pain Assessment Pain Assessment: Faces Faces Pain Scale: Hurts little more Pain Location: back Pain Descriptors / Indicators: Aching Pain Intervention(s): Limited activity within patient's tolerance, Monitored during session, Repositioned    Home Living                          Prior Function            PT Goals (current goals can now be found in the care plan section) Acute Rehab PT Goals Patient Stated Goal: to walk Progress towards PT goals: Progressing toward goals    Frequency    Min 3X/week      PT Plan Current plan remains appropriate    Co-evaluation              AM-PAC PT "6 Clicks" Mobility   Outcome Measure  Help needed turning from your back to your side while in a flat bed without using bedrails?: A Lot Help needed moving from lying on your back to sitting on the side of a flat bed without using bedrails?: A Lot Help needed moving to and from a bed to a chair (including a wheelchair)?: A Lot Help needed standing up from a chair using your arms (e.g., wheelchair or bedside chair)?: A Lot Help needed to walk in hospital room?: A Little Help needed climbing 3-5 steps with a railing? : Total 6 Click Score: 12    End of Session Equipment Utilized During Treatment: Gait belt Activity Tolerance: Patient tolerated treatment well Patient left: with call bell/phone within reach;in bed;with bed alarm set Nurse Communication: Mobility status PT Visit Diagnosis: Unsteadiness on feet (R26.81);Other abnormalities  of gait and mobility (R26.89);Muscle weakness (generalized) (M62.81)     Time: 7628-3151 PT Time Calculation (min) (ACUTE ONLY): 30 min  Charges:  $Gait Training: 8-22 mins $Therapeutic Exercise: 8-22 mins                     Jomaira Darr M,PT Acute Rehab Services [phone] (pager)    Alvira Philips 11/12/2021, 2:00 PM

## 2021-11-12 NOTE — Progress Notes (Signed)
Occupational Therapy Treatment Patient Details Name: Joshua [name] MRN: [id number] DOB: 10/10/1936 Today's Date: 11/12/2021   History of present illness Pt is an 86 y.o. male with severe 3-vessel heart disease, admitted 11/04/21 for CABG x3 and AVR. Post-op course complicated by acute blood loss anemia requiring mediastinal re-exploration 1/18. ETT 1/17-1/19. Pt with complete heart block s/p temporary pacemaker insertion 1/20. Pt with permanent pacer placed 1/22. Pt developed bil pneumothorax on 1/23 and bil chest tubes placed.  PMH inlcudes CAD, severe AS, HTN, HLD, arthritis, GERD, cataracts.   OT comments  Pt was able to complete void of stool with BSC over toilet this session using the eva walker. Pt reports that it was much easier to use the bsc than the bed pan and thanking therapist for the session. Pt requires mod (A) for transfers and safety. Pt needed mod/ max cues during session for UE placement (avoid pulling/ pushing). Recommendation for CIR at this time remain appropriate.    Recommendations for follow up therapy are one component of a multi-disciplinary discharge planning process, led by the attending physician.  Recommendations may be updated based on patient status, additional functional criteria and insurance authorization.    Follow Up Recommendations  Acute inpatient rehab (3hours/day)    Assistance Recommended at Discharge Frequent or constant Supervision/Assistance  Patient can return home with the following  Two people to help with bathing/dressing/bathroom;Two people to help with walking and/or transfers;Assistance with cooking/housework;Assist for transportation   Equipment Recommendations  Other (comment)    Recommendations for Other Services      Precautions / Restrictions Precautions Precautions: Fall;Sternal;ICD/Pacemaker Precaution Comments: L side chest tube/ male primafit Restrictions Weight Bearing Restrictions: Yes Other Position/Activity  Restrictions: sternal       Mobility Bed Mobility Overal bed mobility: Needs Assistance Bed Mobility: Supine to Sit     Supine to sit: Min assist     General bed mobility comments: needs mod (A) to scoot to eob and max cues to not pull or reach outside his core    Transfers                         Balance Overall balance assessment: Needs assistance Sitting-balance support: Bilateral upper extremity supported, Feet supported Sitting balance-Leahy Scale: Fair     Standing balance support: Bilateral upper extremity supported, Reliant on assistive device for balance, During functional activity Standing balance-Leahy Scale: Poor                             ADL either performed or assessed with clinical judgement   ADL Overall ADL's : Needs assistance/impaired                         Toilet Transfer: Moderate assistance;Ambulation;BSC/3in1 Toilet Transfer Details (indicate cue type and reason): cues for safetya dn hand placement. needs (A) to power up Toileting- Clothing Manipulation and Hygiene: Maximal assistance              Extremity/Trunk Assessment Upper Extremity Assessment Upper Extremity Assessment: Generalized weakness            Vision       Perception     Praxis      Cognition Arousal/Alertness: Awake/alert Behavior During Therapy: WFL for tasks assessed/performed Overall Cognitive Status: Within Functional Limits for tasks assessed  Exercises      Shoulder Instructions       General Comments VSS RA    Pertinent Vitals/ Pain       Pain Assessment Pain Assessment: No/denies pain  Home Living                                          Prior Functioning/Environment              Frequency  Min 2X/week        Progress Toward Goals  OT Goals(current goals can now be found in the care plan section)  Progress towards  OT goals: Progressing toward goals  Acute Rehab OT Goals Patient Stated Goal: to be abel to go to the bathroom alone OT Goal Formulation: With patient Time For Goal Achievement: 11/24/21 Potential to Achieve Goals: Good ADL Goals Pt Will Perform Grooming: with min guard assist;standing Pt Will Perform Upper Body Bathing: with set-up;sitting Pt Will Perform Lower Body Bathing: with min assist Pt Will Perform Lower Body Dressing: with min assist Pt Will Transfer to Toilet: with min guard assist;ambulating;bedside commode Pt Will Perform Toileting - Clothing Manipulation and hygiene: with min guard assist;sit to/from stand Additional ADL Goal #1: Pt will be S in and OOB for basic ADLs (following sternal and pacemaker precautions)  Plan Discharge plan remains appropriate    Co-evaluation                 AM-PAC OT "6 Clicks" Daily Activity     Outcome Measure   Help from another person eating meals?: None Help from another person taking care of personal grooming?: A Little Help from another person toileting, which includes using toliet, bedpan, or urinal?: A Lot Help from another person bathing (including washing, rinsing, drying)?: A Lot Help from another person to put on and taking off regular upper body clothing?: A Lot Help from another person to put on and taking off regular lower body clothing?: Total 6 Click Score: 14    End of Session Equipment Utilized During Treatment:  [name] walker)  OT Visit Diagnosis: Unsteadiness on feet (R26.81);Other abnormalities of gait and mobility (R26.89);Muscle weakness (generalized) (M62.81);Pain   Activity Tolerance Patient tolerated treatment well   Patient Left in bed;with call bell/phone within reach;with bed alarm set   Nurse Communication Mobility status;Precautions        Time: 3016-0109 OT Time Calculation (min): 27 min  Charges: OT General Charges $OT Visit: 1 Visit OT Treatments $Self Care/Home Management : 23-37  mins   Joshua [name], OTR/L  Acute Rehabilitation Services Pager: [phone] Office: [phone] .   Jeri Modena 11/12/2021, 4:10 PM

## 2021-11-12 NOTE — Progress Notes (Addendum)
° °   °  [address]       [place], [address]             [phone]      3 Days Post-Op Procedure(s) (LRB): PACEMAKER IMPLANT (N/A) Subjective:  Sitting up in chair, looks good.  He has already ambulated this morning about 160 ft with nursing.  Denies pain, N/V.  Objective: Vital signs in last 24 hours: Temp:  [98.1 F (36.7 C)-98.9 F (37.2 C)] 98.9 F (37.2 C) (01/25 0336) Pulse Rate:  [76-91] 87 (01/25 0600) Cardiac Rhythm: A-V Sequential paced (01/24 2238) Resp:  [9-30] 18 (01/25 0600) BP: (104-147)/(65-81) 135/74 (01/25 0600) SpO2:  [92 %-98 %] 98 % (01/25 0600) Weight:  [85.6 kg] 85.6 kg (01/25 0600)  Intake/Output from previous day: 01/24 0701 - 01/25 0700 In: 420 [P.O.:420] Out: 2350 [Urine:1850; Chest Tube:500]  General appearance: alert, cooperative, and no distress Heart: regular rate and rhythm and paced Lungs: clear to auscultation bilaterally Abdomen: soft, non-tender; bowel sounds normal; no masses,  no organomegaly Extremities: trace Wound: clean and dry, ecchymosis RLE  Lab Results: Recent Labs    11/11/21 0104 11/12/21 0053  WBC 11.2* 10.7*  HGB 9.8* 10.5*  HCT 28.8* 30.8*  PLT 126* 142*   BMET:  Recent Labs    11/11/21 0104 11/12/21 0053  NA 130* 129*  K 4.0 3.6  CL 96* 94*  CO2 24 24  GLUCOSE 112* 107*  BUN 14 14  CREATININE 0.98 0.94  CALCIUM 8.4* 8.5*    PT/INR: No results for input(s): LABPROT, INR in the last 72 hours. ABG    Component Value Date/Time   PHART 7.490 (H) 11/06/2021 0925   HCO3 23.5 11/06/2021 0925   TCO2 24 11/06/2021 0925   ACIDBASEDEF 6.0 (H) 11/05/2021 1032   O2SAT 94.0 11/06/2021 0925   CBG (last 3)  No results for input(s): GLUCAP in the last 72 hours.  Assessment/Plan: S/P Procedure(s) (LRB): PACEMAKER IMPLANT (N/A)  CV- CHB S/P PPM- continue Lopressor, Zestril Pulm- no acute issues, off oxygen, bilateral pigtail catheters in place... R side w/o air leak, no pneumothorax on  CXR will place to water seal, L side w/o air leak, CXR with trace residual pneumothorax, will discuss water seal trial with Dr. Kipp Brood Renal- creatinine is WNL, K is borderline at 3.6, will supplement, Mg is low at 1.6, supplement, and he is hyponatremic due to diuretics, monitor... his weight is almost back to baseline, transition to oral lasix today Expected post operative blood loss anemia, Hgb at 10.5 Deconditioning- working with PT/OT... hopeful for CIR placement at discharge Dispo- patient stable, doing well... Pigtails in place for resolved R pneumothorax, residual left remains present... possible transition to water seal today, supplement electrolytes, continue PT/OT.... ? Transfer to [place] vs 4E   LOS: 9 days  Ellwood Handler, PA-C  11/12/2021  Agree with above Will remove right pigtail Will transfer to floor  [place]

## 2021-11-13 ENCOUNTER — Inpatient Hospital Stay (HOSPITAL_COMMUNITY): Payer: Medicare HMO

## 2021-11-13 LAB — CBC
HCT: 29.6 % — ABNORMAL LOW (ref 39.0–52.0)
Hemoglobin: 9.8 g/dL — ABNORMAL LOW (ref 13.0–17.0)
MCH: 29.1 pg (ref 26.0–34.0)
MCHC: 33.1 g/dL (ref 30.0–36.0)
MCV: 87.8 fL (ref 80.0–100.0)
Platelets: 181 10*3/uL (ref 150–400)
RBC: 3.37 MIL/uL — ABNORMAL LOW (ref 4.22–5.81)
RDW: 17.9 % — ABNORMAL HIGH (ref 11.5–15.5)
WBC: 10 10*3/uL (ref 4.0–10.5)
nRBC: 0 % (ref 0.0–0.2)

## 2021-11-13 LAB — BASIC METABOLIC PANEL
Anion gap: 9 (ref 5–15)
BUN: 16 mg/dL (ref 8–23)
CO2: 27 mmol/L (ref 22–32)
Calcium: 8.5 mg/dL — ABNORMAL LOW (ref 8.9–10.3)
Chloride: 96 mmol/L — ABNORMAL LOW (ref 98–111)
Creatinine, Ser: 1.03 mg/dL (ref 0.61–1.24)
GFR, Estimated: >60 mL/min (ref >60)
Glucose, Bld: 111 mg/dL — ABNORMAL HIGH (ref 70–99)
Potassium: 4.3 mmol/L (ref 3.5–5.1)
Sodium: 132 mmol/L — ABNORMAL LOW (ref 135–145)

## 2021-11-13 NOTE — Progress Notes (Signed)
° °   °  [address]       [place] [address]             [phone]      BP 110/62    Pulse 84    Temp 97.6 F (36.4 C) (Oral)    Resp (!) 25    Ht 5' 11.5" (1.816 m)    Wt 85.2 kg    SpO2 98%    BMI 25.83 kg/m    Intake/Output Summary (Last 24 hours) at 11/13/2021 1750 Last data filed at 11/13/2021 1700 Gross per 24 hour  Intake 1140 ml  Output 2010 ml  Net -870 ml   CIR eval in progress  Remo Lipps C. Roxan Hockey, MD [facility] [phone]

## 2021-11-13 NOTE — Progress Notes (Signed)
Physical Therapy Treatment Patient Details Name: Joshua [name] MRN: [id number] DOB: Mar 10, 1936 Today's Date: 11/13/2021   History of Present Illness Pt is an 86 y.o. male with severe 3-vessel heart disease, admitted 11/04/21 for CABG x3 and AVR. Post-op course complicated by acute blood loss anemia requiring mediastinal re-exploration 1/18. ETT 1/17-1/19. Pt with complete heart block s/p temporary pacemaker insertion 1/20. Pt with permanent pacer placed 1/22. Pt developed bil pneumothorax on 1/23 and bil chest tubes placed.  PMH inlcudes CAD, severe AS, HTN, HLD, arthritis, GERD, cataracts.   PT Comments    Pt progressing with mobility; motivated to participate and regain PLOF. Initiated transfer and gait training with rollator; mobility limited by drainage from L chest tube removal site. Pt remains limited by generalized weakness, decreased activity tolerance, poor balance strategies/postural reactions and impaired cognition. Continue to recommend intensive CIR-level therapies to maximize functional mobility and independence prior to return home.    Recommendations for follow up therapy are one component of a multi-disciplinary discharge planning process, led by the attending physician.  Recommendations may be updated based on patient status, additional functional criteria and insurance authorization.  Follow Up Recommendations  Acute inpatient rehab (3hours/day)     Assistance Recommended at Discharge Intermittent Supervision/Assistance  Patient can return home with the following A little help with walking and/or transfers;A little help with bathing/dressing/bathroom;Assistance with cooking/housework;Assist for transportation   Equipment Recommendations   (defer to next venue)    Recommendations for Other Services       Precautions / Restrictions Precautions Precautions: Fall;Sternal;ICD/Pacemaker;Other (comment) Precaution Comments: drainage from L-side chest tube removal  site Restrictions Other Position/Activity Restrictions: sternal     Mobility  Bed Mobility Overal bed mobility: Needs Assistance Bed Mobility: Supine to Sit     Supine to sit: Min guard, HOB elevated     General bed mobility comments: educ on log roll technique, pt continues to power up to long sitting    Transfers Overall transfer level: Needs assistance Equipment used: Rollator (4 wheels)               General transfer comment: multiple sit<>stand from EOB and recliner to rollator; pt requires repeated verbal cues for safety with rollator, sequencing to stand while maintaining sternal precautions; posterior lean requiring consistent minA+2 for trunk elevation and stability    Ambulation/Gait Ambulation/Gait assistance: Min assist Gait Distance (Feet): 36 Feet Assistive device: Rollator (4 wheels) Gait Pattern/deviations: Step-to pattern, Step-through pattern, Shuffle, Trunk flexed Gait velocity: Decreased     General Gait Details: Slow, unsteady, shuffling gait with rollator and minA for stability; distance limited by chest tube site drainage   Stairs             Wheelchair Mobility    Modified Rankin (Stroke Patients Only)       Balance Overall balance assessment: Needs assistance Sitting-balance support: Feet supported, No upper extremity supported Sitting balance-Leahy Scale: Fair Sitting balance - Comments: min guard assist to sit EOB.   Standing balance support: Bilateral upper extremity supported, Reliant on assistive device for balance, During functional activity Standing balance-Leahy Scale: Poor Standing balance comment: reliant on UE support and external assist                            Cognition Arousal/Alertness: Awake/alert Behavior During Therapy: WFL for tasks assessed/performed Overall Cognitive Status: No family/caregiver present to determine baseline cognitive functioning Area of Impairment: Attention, Following  commands, Problem solving  Current Attention Level: Selective   Following Commands: Follows multi-step commands consistently     Problem Solving: Requires verbal cues, Slow processing General Comments: Moments with need for increased cues to follow certain comments. possibly age appropiate and/or HOH impacting following commands.        Exercises      General Comments General comments (skin integrity, edema, etc.): RN present for redressing chest tube removal site      Pertinent Vitals/Pain Pain Assessment Pain Assessment: Faces Faces Pain Scale: Hurts little more Pain Location: back Pain Descriptors / Indicators: Aching Pain Intervention(s): Monitored during session, Limited activity within patient's tolerance    Home Living     Available Help at Discharge:  (Has a longterm care insurance policy)                    Prior Function            PT Goals (current goals can now be found in the care plan section) Progress towards PT goals: Progressing toward goals    Frequency    Min 3X/week      PT Plan Current plan remains appropriate    Co-evaluation   Reason for Co-Treatment: For patient/therapist safety;To address functional/ADL transfers          AM-PAC PT "6 Clicks" Mobility   Outcome Measure  Help needed turning from your back to your side while in a flat bed without using bedrails?: A Lot Help needed moving from lying on your back to sitting on the side of a flat bed without using bedrails?: A Lot Help needed moving to and from a bed to a chair (including a wheelchair)?: A Little Help needed standing up from a chair using your arms (e.g., wheelchair or bedside chair)?: A Little Help needed to walk in hospital room?: A Little Help needed climbing 3-5 steps with a railing? : Total 6 Click Score: 14    End of Session   Activity Tolerance: Patient tolerated treatment well Patient left: in chair;with call bell/phone  within reach;with family/visitor present Nurse Communication: Mobility status PT Visit Diagnosis: Unsteadiness on feet (R26.81);Other abnormalities of gait and mobility (R26.89);Muscle weakness (generalized) (M62.81)     Time: 7846-9629 PT Time Calculation (min) (ACUTE ONLY): 29 min  Charges:  $Therapeutic Activity: 8-22 mins                     Mabeline Caras, PT, DPT Acute Rehabilitation Services  Pager [phone] Office [phone]  Derry Lory 11/13/2021, 12:17 PM

## 2021-11-13 NOTE — Progress Notes (Signed)
Pt arrived to unit from 2heart  A/O x 4,  CCMD called ,CHG given, pt oriented to unit,Will continue to monitor.   Albin Felling Vennesa Bastedo, RN    11/13/21 1842  Vitals  Temp 97.9 F (36.6 C)  Temp Source Oral  BP 116/69  MAP (mmHg) 84  BP Location Left Arm  BP Method Automatic  Patient Position (if appropriate) Lying  Pulse Rate 82  Pulse Rate Source Monitor  ECG Heart Rate 82  Resp (!) 21  Level of Consciousness  Level of Consciousness Alert  Oxygen Therapy  SpO2 98 %  O2 Device Room Air  O2 Flow Rate (L/min) 0 L/min  MEWS Score  MEWS Temp 0  MEWS Systolic 0  MEWS Pulse 0  MEWS RR 1  MEWS LOC 0  MEWS Score 1  MEWS Score Color Green

## 2021-11-13 NOTE — PMR Pre-admission (Shared)
PMR Admission Coordinator Pre-Admission Assessment  Patient: Joshua [name] is an 86 y.o., male MRN: [id number] DOB: [birth year] Height: 5' 11.5" (181.6 cm) Weight: 85.2 kg  Insurance Information HMO: yes    PPO:      PCP:      IPA:      80/20:      OTHER:  PRIMARY: [employer] Medicare      Policy#: [id number]      Subscriber: pt CM Name: ***      Phone#: [phone] ext *     Fax#: [phone] Pre-Cert#: [id number]      Employer:  Benefits:  Phone #: [phone]     Name: 1/26 Eff. Date: 10/19/2021     Deduct: none      Out of Pocket Max: $3400      Life Max: none CIR: $295 co pay per day days 1 until 6      SNF: no copay days 1 until 20; $196 co pay per day days 21 until 100 Outpatient: $10 to $20 per visit     Co-Pay: visits per medical neccesity Home Health: 100%      Co-Pay: visits per medical neccesity DME: 80%     Co-Pay: 20% Providers: in network  SECONDARY: none        Financial Counselor:       Phone#:   [facility] [employer] for patients in Inpatient Rehabilitation Facilities with attached Privacy Act [place] Records was provided and verbally reviewed with: Patient  Emergency Contact Information Contact Information     Name Relation Home Work Mobile   [place] Son [phone]  [phone]   [place], Du[place]   [phone]       Current Medical History  Patient Admitting Diagnosis: Sever aortic stenosis, Debility  History of Present Illness: 86 year old male with history of PVC's, GERD, HTN, HLD< aortic stenosis. Referred due to syncopal episodes. ECHO showed EF of 60 to 65 % with severe aortic stenosis. He was referred to Dr Angelena Form for possible TAVR procedure. Cardiac cath performed on 11/03/21 and showed 3 v CAD. It was felt the patient would require CABG and conventional Aortic Valve replacement and cardiac surgery consulted.   Underwent CABG and AVR on 11/04/21. Postoperatively bleeding noted and returned to OR on 1/18  for reexploration with chest tube placements, and received PRBCs, FFP, pheresed platelets and cryo. Full vent support. No clear source of bleeding found. Sinus bradycardia with 2nd degree AV block. Pacer placed by cardiology on 1/22. ON 1/23 developed respiratory distress with bilateral pigtail catheters placed for CXR showed bilateral pneumothoraces. Both pigtail catheters removed by 1/26 with chest tube outputs decreased prior to removal. Now deconditioned postoperatively after numerous medical complexities.    Patient's medical record from [facility] has been reviewed by the rehabilitation admission coordinator and physician.  Past Medical History  Past Medical History:  Diagnosis Date   Arthritis    Cataract    Esophageal stricture    GERD (gastroesophageal reflux disease)    Hyperlipidemia    Hypertension    Liver disease    hepatitis   Severe aortic stenosis     Has the patient had major surgery during 100 days prior to admission? Yes  Family History   family history includes Cancer in his maternal grandmother; Diabetes in his child; Heart Problems (age of onset: 59) in his child; Heart failure (age of onset: 76) in his father; Heart failure (age of onset: 37) in his mother.  Current Medications  Current Facility-Administered Medications:    0.9 %  sodium chloride infusion, 250 mL, Intravenous, PRN, Lightfoot, Harrell O, MD   acetaminophen (TYLENOL) tablet 325-650 mg, 325-650 mg, Oral, Q4H PRN, Vickie Epley, MD, 650 mg at 11/12/21 2133   aspirin EC tablet 325 mg, 325 mg, Oral, Daily, 325 mg at 11/13/21 1018 **OR** aspirin chewable tablet 324 mg, 324 mg, Per Tube, Daily, Melrose Nakayama, MD   atorvastatin (LIPITOR) tablet 40 mg, 40 mg, Per Tube, Daily, Melrose Nakayama, MD, 40 mg at 11/13/21 1018   bisacodyl (DULCOLAX) EC tablet 10 mg, 10 mg, Oral, Daily, 10 mg at 11/12/21 0909 **OR** bisacodyl (DULCOLAX) suppository 10 mg, 10 mg, Rectal, Daily,  Melrose Nakayama, MD   Chlorhexidine Gluconate Cloth 2 % PADS 6 each, 6 each, Topical, Daily, Melrose Nakayama, MD, 6 each at 11/13/21 1000   docusate sodium (COLACE) capsule 200 mg, 200 mg, Oral, Daily, Melrose Nakayama, MD, 200 mg at 11/12/21 3382   furosemide (LASIX) tablet 40 mg, 40 mg, Oral, Daily, Barrett, Erin R, PA-C, 40 mg at 11/13/21 1018   levalbuterol (XOPENEX) nebulizer solution 1.25 mg, 1.25 mg, Nebulization, Q6H PRN, Kipp Brood, Harrell O, MD, 1.25 mg at 11/10/21 0412   lisinopril (ZESTRIL) tablet 20 mg, 20 mg, Oral, Daily, Melrose Nakayama, MD, 20 mg at 11/13/21 1018   magnesium oxide (MAG-OX) tablet 400 mg, 400 mg, Oral, BID, Barrett, Erin R, PA-C, 400 mg at 11/13/21 1018   metoprolol tartrate (LOPRESSOR) tablet 25 mg, 25 mg, Oral, BID, Lightfoot, Harrell O, MD, 25 mg at 11/13/21 1018   morphine 2 MG/ML injection 1-4 mg, 1-4 mg, Intravenous, Q1H PRN, Melrose Nakayama, MD, 2 mg at 11/08/21 0930   ondansetron (ZOFRAN) injection 4 mg, 4 mg, Intravenous, Q6H PRN, Melrose Nakayama, MD   oxyCODONE (Oxy IR/ROXICODONE) immediate release tablet 5-10 mg, 5-10 mg, Oral, Q3H PRN, Melrose Nakayama, MD, 5 mg at 11/12/21 1952   pantoprazole (PROTONIX) EC tablet 40 mg, 40 mg, Oral, Daily, Melrose Nakayama, MD, 40 mg at 11/13/21 1018   sodium chloride flush (NS) 0.9 % injection 3 mL, 3 mL, Intravenous, Q12H, Lightfoot, Harrell O, MD, 3 mL at 11/13/21 1037   sodium chloride flush (NS) 0.9 % injection 3 mL, 3 mL, Intravenous, PRN, Lightfoot, Lucile Crater, MD   traMADol (ULTRAM) tablet 50-100 mg, 50-100 mg, Oral, Q4H PRN, Melrose Nakayama, MD, 100 mg at 11/10/21 2006   zolpidem (AMBIEN) tablet 5 mg, 5 mg, Oral, QHS PRN, Melrose Nakayama, MD, 5 mg at 11/12/21 2137  Patients Current Diet:  Diet Order             Diet Heart Room service appropriate? Yes; Fluid consistency: Thin  Diet effective now                   Precautions /  Restrictions Precautions Precautions: Fall, Sternal, ICD/Pacemaker Precaution Comments: L side chest tube/ male primafit Restrictions Weight Bearing Restrictions: Yes Other Position/Activity Restrictions: sternal   Has the patient had 2 or more falls or a fall with injury in the past year? No  Prior Activity Level Community (5-7x/wk): Independent and driving  Prior Functional Level Self Care: Did the patient need help bathing, dressing, using the toilet or eating? Independent  Indoor Mobility: Did the patient need assistance with walking from room to room (with or without device)? Independent  Stairs: Did the patient need assistance with internal or external stairs (with  or without device)? Independent  Functional Cognition: Did the patient need help planning regular tasks such as shopping or remembering to take medications? Independent  Patient Information Are you of Hispanic, Latino/a,or Spanish origin?: A. No, not of Hispanic, Latino/a, or Spanish origin What is your race?: A. White Do you need or want an interpreter to communicate with a doctor or health care staff?: 0. No  Patient's Response To:  Health Literacy and Transportation Is the patient able to respond to health literacy and transportation needs?: Yes Health Literacy - How often do you need to have someone help you when you read instructions, pamphlets, or other written material from your doctor or pharmacy?: Never In the past 12 months, has lack of transportation kept you from medical appointments or from getting medications?: No In the past 12 months, has lack of transportation kept you from meetings, work, or from getting things needed for daily living?: No  Development worker, international aid / [place] Devices/Equipment: Grab bars in shower, Wheelchair, Environmental consultant (specify type), Cane (specify quad or straight), Blood pressure cuff, Eyeglasses, Dentures (specify type) Home Equipment: Rollator (4 wheels), Cane -  single point, Toilet riser, Grab bars - toilet, Grab bars - tub/shower, Hand held shower head, Wheelchair - manual  Prior Device Use: Indicate devices/aids used by the patient prior to current illness, exacerbation or injury? None of the above  Current Functional Level Cognition  Overall Cognitive Status: Impaired/Different from baseline Orientation Level: Oriented X4 General Comments: Moments with need for increased cues to follow certain comments. possibly age appropiate and/or HOH impacting following commands.    Extremity Assessment (includes Sensation/Coordination)  Upper Extremity Assessment: Generalized weakness  Lower Extremity Assessment: Defer to PT evaluation    ADLs  Overall ADL's : Needs assistance/impaired Eating/Feeding: Independent, Sitting Grooming: Oral care, Minimal assistance, Standing Grooming Details (indicate cue type and reason): Min A for balance in standing Upper Body Bathing: Minimal assistance, Sitting Lower Body Bathing: Maximal assistance Lower Body Bathing Details (indicate cue type and reason): Mod A +2 sit<stand Upper Body Dressing : Moderate assistance, Sitting Lower Body Dressing: Minimal assistance, +2 for physical assistance, +2 for safety/equipment, Sit to/from stand Lower Body Dressing Details (indicate cue type and reason): figure four for donning socks. Mi nA for standing balance Toilet Transfer: Ambulation, Minimal assistance, +2 for physical assistance, +2 for safety/equipment, Rollator (4 wheels) (simulated to recliner) Toilet Transfer Details (indicate cue type and reason): Min A for power up and posterior lean. cues for more weight hsift forward but pt would benefit from increased practice Toileting- Clothing Manipulation and Hygiene: Maximal assistance Toileting - Clothing Manipulation Details (indicate cue type and reason): Mod A +2 sit<stand with sara stedy Functional mobility during ADLs: Minimal assistance, Rollator (4 wheels), +2 for  safety/equipment General ADL Comments: Pt continues to present with decreased balance, strength, and ROM.    Mobility  Overal bed mobility: Needs Assistance Bed Mobility: Supine to Sit Supine to sit: Min guard, HOB elevated Sit to sidelying: Min assist, Mod assist General bed mobility comments: Min GUard A for safety    Transfers  Overall transfer level: Needs assistance Equipment used: Rollator (4 wheels) Transfers: Sit to/from Stand Sit to Stand: Min assist, +2 physical assistance, +2 safety/equipment Bed to/from chair/wheelchair/BSC transfer type:: Via Public house manager via Lift Equipment: Stedy General transfer comment: Min A +2 for power up and correcting posterior lean    Ambulation / Gait / Stairs / Wheelchair Mobility  Ambulation/Gait Ambulation/Gait assistance: Herbalist (Feet):  160 Feet Assistive device: Ethelene Hal Gait Pattern/deviations: Decreased step length - right, Decreased step length - left, Decreased stride length, Decreased weight shift to right, Decreased weight shift to left, Knee flexed in stance - right, Knee flexed in stance - left, Drifts right/left, Trunk flexed General Gait Details: Pt needed cues for upright posture as he tends to flex trunk and bil knees as well. Pt needed cues to stand up tall constantly. Pt also needed cues to incr step length as he shuffles his feet at times. Pt was able to progress his gait but does need assist at present time. Gait velocity interpretation: <1.31 ft/sec, indicative of household ambulator    Posture / Balance Dynamic Sitting Balance Sitting balance - Comments: min guard assist to sit EOB. Balance Overall balance assessment: Needs assistance Sitting-balance support: Feet supported, No upper extremity supported Sitting balance-Leahy Scale: Good Sitting balance - Comments: min guard assist to sit EOB. Postural control: Right lateral lean Standing balance support: Bilateral upper extremity  supported, Reliant on assistive device for balance, During functional activity Standing balance-Leahy Scale: Poor Standing balance comment: Reliant on physical A and UE support    Special needs/care consideration    Previous Home Environment  Living Arrangements: Alone  Lives With: Alone Available Help at Discharge:  (Has a longterm care insurance policy) Type of Home: House Home Layout: One level Home Access: Level entry Bathroom Shower/Tub: Multimedia programmer: Handicapped height Bathroom Accessibility: Yes How Accessible: Accessible via walker Graham: No  Discharge Living Setting Plans for Discharge Living Setting: Patient's home, Alone Type of Home at Discharge: House Discharge Home Layout: One level Discharge Home Access: Level entry Discharge Bathroom Shower/Tub: Walk-in shower Discharge Bathroom Toilet: Handicapped height Discharge Bathroom Accessibility: Yes How Accessible: Accessible via walker Does the patient have any problems obtaining your medications?: No  Social/Family/Support Systems Patient Roles: Parent Contact Information: son, [name] Anticipated Caregiver: [name], family and friends Anticipated Caregiver's Contact Information: see contacts Ability/Limitations of Caregiver: [name] works, but would arrange assistance as needed Caregiver Availability: Other (Comment) (would arrange as recommended) Discharge Plan Discussed with Primary Caregiver: Yes Is Caregiver In Agreement with Plan?: Yes Does Caregiver/Family have Issues with Lodging/Transportation while Pt is in Rehab?: No  Goals Patient/Family Goal for Rehab: Mod I to supervision with PT and OT Expected length of stay: ELOS 10 to 14 days Pt/Family Agrees to Admission and willing to participate: Yes Program Orientation Provided & Reviewed with Pt/Caregiver Including Roles  & Responsibilities: Yes  Decrease burden of Care through IP rehab admission: n/a  Possible need for SNF  placement upon discharge: not anticipated  Patient Condition: I have reviewed medical records from [facility] , spoken with CM, and patient and son. I met with patient at the bedside for inpatient rehabilitation assessment.  Patient will benefit from ongoing PT and OT, can actively participate in 3 hours of therapy a day 5 days of the week, and can make measurable gains during the admission.  Patient will also benefit from the coordinated team approach during an Inpatient Acute Rehabilitation admission.  The patient will receive intensive therapy as well as Rehabilitation physician, nursing, social worker, and care management interventions.  Due to bladder management, bowel management, safety, skin/wound care, disease management, medication administration, pain management, and patient education the patient requires 24 hour a day rehabilitation nursing.  The patient is currently min to mod assist with mobility and basic ADLs.  Discharge setting and therapy post discharge at home with home  health is anticipated.  Patient has agreed to participate in the Acute Inpatient Rehabilitation Program and will admit today.  Preadmission Screen Completed By:  Cleatrice Burke, 11/13/2021 11:03 AM ______________________________________________________________________   Discussed status with Dr. Marland Kitchen on *** at *** and received approval for admission today.  Admission Coordinator:  Cleatrice Burke, RN, time Marland KitchenSudie Grumbling ***   Assessment/Plan: Diagnosis: Does the need for close, 24 hr/day Medical supervision in concert with the patient's rehab needs make it unreasonable for this patient to be served in a less intensive setting? {yes_no_potentially:3041433} Co-Morbidities requiring supervision/potential complications: *** Due to {due UK:0254270}, does the patient require 24 hr/day rehab nursing? {yes_no_potentially:3041433} Does the patient require coordinated care of a physician, rehab nurse, PT, OT,  and SLP to address physical and functional deficits in the context of the above medical diagnosis(es)? {yes_no_potentially:3041433} Addressing deficits in the following areas: {deficits:3041436} Can the patient actively participate in an intensive therapy program of at least 3 hrs of therapy 5 days a week? {yes_no_potentially:3041433} The potential for patient to make measurable gains while on inpatient rehab is {potential:3041437} Anticipated functional outcomes upon discharge from inpatient rehab: {functional outcomes:304600100} PT, {functional outcomes:304600100} OT, {functional outcomes:304600100} SLP Estimated rehab length of stay to reach the above functional goals is: *** Anticipated discharge destination: {anticipated dc setting:21604} 10. Overall Rehab/Functional Prognosis: {potential:3041437}   MD Signature: ***

## 2021-11-13 NOTE — Progress Notes (Signed)
Inpatient Rehabilitation Admissions Coordinator   Notified by Ellwood Handler, PA of last chest tube removal. I will begin insurance Auth with [facility] Medicare after OT treatment today, for a possible Cir admit pending insurance approval. I will follow up tomorrow with insurance determination. OT has been notified of need.  Danne Baxter, RN, MSN Rehab Admissions Coordinator [phone] 11/13/2021 8:40 AM

## 2021-11-13 NOTE — Progress Notes (Signed)
Occupational Therapy Treatment Patient Details Name: Joshua [name] MRN: [id number] DOB: Dec 24, 1935 Today's Date: 11/13/2021   History of present illness Pt is an 86 y.o. male with severe 3-vessel heart disease, admitted 11/04/21 for CABG x3 and AVR. Post-op course complicated by acute blood loss anemia requiring mediastinal re-exploration 1/18. ETT 1/17-1/19. Pt with complete heart block s/p temporary pacemaker insertion 1/20. Pt with permanent pacer placed 1/22. Pt developed bil pneumothorax on 1/23 and bil chest tubes placed.  PMH inlcudes CAD, severe AS, HTN, HLD, arthritis, GERD, cataracts.   OT comments  Pt progressing towards established OT goals and is very motivated to participate in therapy. Pt currently performing oral care while standing at sink with Min A for balance. Cues for compensatory techniques to adhere to sternal precautions. Pt performing functional mobility in room with Min A and rollator. Mobility distance limited due to blood/drainage from chest tube site with activity. Notified RN and dressing replaced. Pt donning socks with cues for figure four method. Continue to highly recommend dc to AIR and will continue to follow acutely as admired.   Recommendations for follow up therapy are one component of a multi-disciplinary discharge planning process, led by the attending physician.  Recommendations may be updated based on patient status, additional functional criteria and insurance authorization.    Follow Up Recommendations  Acute inpatient rehab (3hours/day)    Assistance Recommended at Discharge Frequent or constant Supervision/Assistance  Patient can return home with the following  Two people to help with bathing/dressing/bathroom;Two people to help with walking and/or transfers;Assistance with cooking/housework;Assist for transportation   Equipment Recommendations  Other (comment)    Recommendations for Other Services      Precautions / Restrictions  Precautions Precautions: Fall;Sternal;ICD/Pacemaker Restrictions Other Position/Activity Restrictions: sternal       Mobility Bed Mobility Overal bed mobility: Needs Assistance Bed Mobility: Supine to Sit     Supine to sit: Min guard, HOB elevated     General bed mobility comments: Min GUard A for safety    Transfers Overall transfer level: Needs assistance Equipment used: Rollator (4 wheels) Transfers: Sit to/from Stand Sit to Stand: Min assist, +2 physical assistance, +2 safety/equipment           General transfer comment: Min A +2 for power up and correcting posterior lean     Balance Overall balance assessment: Needs assistance Sitting-balance support: Feet supported, No upper extremity supported Sitting balance-Leahy Scale: Good   Postural control: Right lateral lean Standing balance support: Bilateral upper extremity supported, Reliant on assistive device for balance, During functional activity Standing balance-Leahy Scale: Poor Standing balance comment: Reliant on physical A and UE support                           ADL either performed or assessed with clinical judgement   ADL Overall ADL's : Needs assistance/impaired     Grooming: Oral care;Minimal assistance;Standing Grooming Details (indicate cue type and reason): Min A for balance in standing             Lower Body Dressing: Minimal assistance;+2 for physical assistance;+2 for safety/equipment;Sit to/from stand Lower Body Dressing Details (indicate cue type and reason): figure four for donning socks. Mi nA for standing balance Toilet Transfer: Ambulation;Minimal assistance;+2 for physical assistance;+2 for safety/equipment;Rollator (4 wheels) (simulated to recliner) Toilet Transfer Details (indicate cue type and reason): Min A for power up and posterior lean. cues for more weight hsift forward but pt would benefit  from increased practice         Functional mobility during ADLs:  Minimal assistance;Rollator (4 wheels);+2 for safety/equipment General ADL Comments: Pt continues to present with decreased balance, strength, and ROM.    Extremity/Trunk Assessment Upper Extremity Assessment Upper Extremity Assessment: Generalized weakness   Lower Extremity Assessment Lower Extremity Assessment: Defer to PT evaluation        Vision       Perception     Praxis      Cognition Arousal/Alertness: Awake/alert Behavior During Therapy: WFL for tasks assessed/performed Overall Cognitive Status: Impaired/Different from baseline Area of Impairment: Problem solving                             Problem Solving: Requires verbal cues, Slow processing General Comments: Moments with need for increased cues to follow certain comments. possibly age appropiate and/or HOH impacting following commands.        Exercises      Shoulder Instructions       General Comments VSS on RA. Blood/drainage at chest tube site. RN present and redressing    Pertinent Vitals/ Pain       Pain Assessment Pain Assessment: Faces Faces Pain Scale: Hurts little more Pain Location: back Pain Descriptors / Indicators: Aching Pain Intervention(s): Monitored during session, Limited activity within patient's tolerance, Repositioned  Home Living                                          Prior Functioning/Environment              Frequency  Min 2X/week        Progress Toward Goals  OT Goals(current goals can now be found in the care plan section)  Progress towards OT goals: Progressing toward goals  Acute Rehab OT Goals OT Goal Formulation: With patient Time For Goal Achievement: 11/24/21 Potential to Achieve Goals: Good ADL Goals Pt Will Perform Grooming: with min guard assist;standing Pt Will Perform Upper Body Bathing: with set-up;sitting Pt Will Perform Lower Body Bathing: with min assist Pt Will Perform Lower Body Dressing: with min  assist Pt Will Transfer to Toilet: with min guard assist;ambulating;bedside commode Pt Will Perform Toileting - Clothing Manipulation and hygiene: with min guard assist;sit to/from stand Additional ADL Goal #1: Pt will be S in and OOB for basic ADLs (following sternal and pacemaker precautions)  Plan Discharge plan remains appropriate    Co-evaluation    PT/OT/SLP Co-Evaluation/Treatment: Yes Reason for Co-Treatment: For patient/therapist safety;To address functional/ADL transfers          AM-PAC OT "6 Clicks" Daily Activity     Outcome Measure   Help from another person eating meals?: None Help from another person taking care of personal grooming?: A Little Help from another person toileting, which includes using toliet, bedpan, or urinal?: A Lot Help from another person bathing (including washing, rinsing, drying)?: A Lot Help from another person to put on and taking off regular upper body clothing?: A Lot Help from another person to put on and taking off regular lower body clothing?: A Little 6 Click Score: 16    End of Session Equipment Utilized During Treatment: Rollator (4 wheels)  OT Visit Diagnosis: Unsteadiness on feet (R26.81);Other abnormalities of gait and mobility (R26.89);Muscle weakness (generalized) (M62.81);Pain Pain - part of body:  (back)   Activity  Tolerance Patient tolerated treatment well   Patient Left with call bell/phone within reach;in chair   Nurse Communication Mobility status;Precautions        Time: 9795-3692 OT Time Calculation (min): 28 min  Charges: OT General Charges $OT Visit: 1 Visit OT Treatments $Self Care/Home Management : 8-22 mins  [place], OTR/L Acute Rehab Pager: [phone] Office: [place] 11/13/2021, 10:22 AM

## 2021-11-13 NOTE — Progress Notes (Signed)
° °   °  [address]       [place] [address]             [phone]                 4 Days Post-Op Procedure(s) (LRB): PACEMAKER IMPLANT (N/A)   Events: No events ____________________________ Vitals: BP 119/68    Pulse 83    Temp 98.6 F (37 C)    Resp 14    Ht 5' 11.5" (1.816 m)    Wt 85.2 kg    SpO2 96%    BMI 25.83 kg/m  Filed Weights   11/11/21 0608 11/12/21 0600 11/13/21 0500  Weight: 87.4 kg 85.6 kg 85.2 kg     - Neuro: alert NAD  - Cardiovascular: paced  Drips: none.      - Pulm: No leak.  EWOB SS CT output FiO2 (%):  [0 %] 0 %  ABG    Component Value Date/Time   PHART 7.490 (H) 11/06/2021 0925   PCO2ART 30.8 (L) 11/06/2021 0925   PO2ART 64 (L) 11/06/2021 0925   HCO3 23.5 11/06/2021 0925   TCO2 24 11/06/2021 0925   ACIDBASEDEF 6.0 (H) 11/05/2021 1032   O2SAT 94.0 11/06/2021 0925    - Abd: ND - Extremity: warm  .Intake/Output      01/25 0701 01/26 0700 01/26 0701 01/27 0700   P.O. 1220    Total Intake(mL/kg) 1220 (14.3)    Urine (mL/kg/hr) 1800 (0.9)    Stool 0    Chest Tube 20    Total Output 1820    Net -600         Urine Occurrence 1 x    Stool Occurrence 1 x       _______________________________________________________________ Labs: CBC Latest Ref Rng & Units 11/13/2021 11/12/2021 11/11/2021  WBC 4.0 - 10.5 K/uL 10.0 10.7(H) 11.2(H)  Hemoglobin 13.0 - 17.0 g/dL 9.8(L) 10.5(L) 9.8(L)  Hematocrit 39.0 - 52.0 % 29.6(L) 30.8(L) 28.8(L)  Platelets 150 - 400 K/uL 181 142(L) 126(L)   CMP Latest Ref Rng & Units 11/13/2021 11/12/2021 11/11/2021  Glucose 70 - 99 mg/dL 111(H) 107(H) 112(H)  BUN 8 - 23 mg/dL 16 14 14   Creatinine 0.61 - 1.24 mg/dL 1.03 0.94 0.98  Sodium 135 - 145 mmol/L 132(L) 129(L) 130(L)  Potassium 3.5 - 5.1 mmol/L 4.3 3.6 4.0  Chloride 98 - 111 mmol/L 96(L) 94(L) 96(L)  CO2 22 - 32 mmol/L 27 24 24   Calcium 8.9 - 10.3 mg/dL 8.5(L) 8.5(L) 8.4(L)  Total Protein 6.5 - 8.1 g/dL - - -  Total Bilirubin 0.3 -  1.2 mg/dL - - -  Alkaline Phos 38 - 126 U/L - - -  AST 15 - 41 U/L - - -  ALT 0 - 44 U/L - - -    CXR: pneumoT improved.    _______________________________________________________________  Assessment and Plan: POD 9 s/p AVR CABG, POD 8 s/p re-exploration, POD 4 s/p PPM  Neuro: pain controlled CV: PPM in place.  Will titrate BP meds.  On A/S/BB.   Pulm: will keep Cts.  Pulm hygiene Renal: creat stable GI: on diet Heme: stable ID: afebrile Endo: SSI Dispo: floor.  CIR consult placed   Joshua [name] O Joshua [name] 11/13/2021 7:28 AM

## 2021-11-14 ENCOUNTER — Inpatient Hospital Stay (HOSPITAL_COMMUNITY): Payer: Medicare HMO

## 2021-11-14 DIAGNOSIS — Z950 Presence of cardiac pacemaker: Secondary | ICD-10-CM

## 2021-11-14 DIAGNOSIS — I442 Atrioventricular block, complete: Secondary | ICD-10-CM

## 2021-11-14 MED ORDER — LISINOPRIL 10 MG PO TABS
10.0000 mg | ORAL_TABLET | Freq: Every day | ORAL | Status: DC
  Administered 2021-11-14 – 2021-11-21 (×6): 10 mg via ORAL
  Filled 2021-11-14 (×7): qty 1

## 2021-11-14 MED ORDER — ATORVASTATIN CALCIUM 40 MG PO TABS
40.0000 mg | ORAL_TABLET | Freq: Every day | ORAL | Status: DC
  Administered 2021-11-14 – 2021-11-21 (×8): 40 mg via ORAL
  Filled 2021-11-14 (×8): qty 1

## 2021-11-14 NOTE — NC FL2 (Signed)
[place] LEVEL OF CARE SCREENING TOOL     IDENTIFICATION  Patient Name: Joshua [name] Birthdate: 1936/09/08 Sex: male Admission Date (Current Location): 11/03/2021  [facility] and [place] Number:  Herbalist and Address:  The [facility], [place] [address], [place], [place] [address]      Provider Number: [id number]  Attending Physician Name and Address:  Lajuana Matte, MD  Relative Name and Phone Number:       Current Level of Care: Hospital Recommended Level of Care: [place] Prior Approval Number:    Date Approved/Denied:   PASRR Number: [id number]  Discharge Plan: SNF    Current Diagnoses: Patient Active Problem List   Diagnosis Date Noted   CHB (complete heart block) ([place]) 11/14/2021   S/P placement of cardiac pacemaker 11/14/2021   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   S/P aortic valve replacement with bioprosthetic valve 11/04/2021   Severe aortic stenosis    Infected abrasion of fifth toe, right, initial encounter 10/31/2019   Pain due to onychomycosis of toenails of both feet 04/25/2019   Porokeratosis 04/25/2019   Gastroesophageal reflux disease 04/22/2018   Moderate calcific aortic stenosis 08/14/2016   PVCs (premature ventricular contractions) 08/14/2016   Hypercholesterolemia 08/14/2016   DJD (degenerative joint disease) 08/14/2016   Essential hypertension 07/16/2016    Orientation RESPIRATION BLADDER Height & Weight     Self, Time, Situation, Place  Normal External catheter, Incontinent Weight: 183 lb 11.2 oz (83.3 kg) Height:  5' 11.5" (181.6 cm)  BEHAVIORAL SYMPTOMS/MOOD NEUROLOGICAL BOWEL NUTRITION STATUS      Continent Diet (please see discharge summary)  AMBULATORY STATUS COMMUNICATION OF NEEDS Skin   Limited Assist Verbally Surgical wounds (Closed Incision Chest, Closed Incision RT Leg, Closed incision Lft Upper Chest)                       Personal Care Assistance Level of  Assistance  Bathing, Feeding, Dressing Bathing Assistance: Limited assistance Feeding assistance: Independent Dressing Assistance: Limited assistance     Functional Limitations Info  Sight, Hearing, Speech Sight Info: Impaired Hearing Info: Adequate Speech Info: Adequate    SPECIAL CARE FACTORS FREQUENCY  PT (By licensed PT), OT (By licensed OT)     PT Frequency: 5x per week OT Frequency: 5x per week            Contractures Contractures Info: Not present    Additional Factors Info  Code Status, Allergies Code Status Info: FULL Allergies Info: NKA           Current Medications (11/14/2021):  This is the current hospital active medication list Current Facility-Administered Medications  Medication Dose Route Frequency Provider Last Rate Last Admin   0.9 %  sodium chloride infusion  250 mL Intravenous PRN Lajuana Matte, MD       acetaminophen (TYLENOL) tablet 325-650 mg  325-650 mg Oral Q4H PRN Vickie Epley, MD   650 mg at 11/12/21 2133   aspirin EC tablet 325 mg  325 mg Oral Daily Melrose Nakayama, MD   325 mg at 11/14/21 6144   Or   aspirin chewable tablet 324 mg  324 mg Per Tube Daily Melrose Nakayama, MD       atorvastatin (LIPITOR) tablet 40 mg  40 mg Oral Daily Hammons, Kimberly B, RPH   40 mg at 11/14/21 0947   bisacodyl (DULCOLAX) EC tablet 10 mg  10 mg Oral Daily  Melrose Nakayama, MD   10 mg at 11/14/21 3220   Or   bisacodyl (DULCOLAX) suppository 10 mg  10 mg Rectal Daily Melrose Nakayama, MD       docusate sodium (COLACE) capsule 200 mg  200 mg Oral Daily Melrose Nakayama, MD   200 mg at 11/14/21 0947   furosemide (LASIX) tablet 40 mg  40 mg Oral Daily Barrett, Erin R, PA-C   40 mg at 11/14/21 0947   levalbuterol (XOPENEX) nebulizer solution 1.25 mg  1.25 mg Nebulization Q6H PRN Lajuana Matte, MD   1.25 mg at 11/10/21 0412   lisinopril (ZESTRIL) tablet 10 mg  10 mg Oral Daily Barrett, Erin R, PA-C   10 mg at 11/14/21  0947   magnesium oxide (MAG-OX) tablet 400 mg  400 mg Oral BID Barrett, Erin R, PA-C   400 mg at 11/14/21 0947   metoprolol tartrate (LOPRESSOR) tablet 25 mg  25 mg Oral BID Lajuana Matte, MD   25 mg at 11/14/21 0947   morphine 2 MG/ML injection 1-4 mg  1-4 mg Intravenous Q1H PRN Melrose Nakayama, MD   2 mg at 11/08/21 0930   ondansetron (ZOFRAN) injection 4 mg  4 mg Intravenous Q6H PRN Melrose Nakayama, MD       oxyCODONE (Oxy IR/ROXICODONE) immediate release tablet 5-10 mg  5-10 mg Oral Q3H PRN Melrose Nakayama, MD   5 mg at 11/12/21 1952   pantoprazole (PROTONIX) EC tablet 40 mg  40 mg Oral Daily Melrose Nakayama, MD   40 mg at 11/14/21 0947   sodium chloride flush (NS) 0.9 % injection 3 mL  3 mL Intravenous Q12H Lightfoot, Lucile Crater, MD   3 mL at 11/14/21 0946   sodium chloride flush (NS) 0.9 % injection 3 mL  3 mL Intravenous PRN Lajuana Matte, MD       traMADol Veatrice Bourbon) tablet 50-100 mg  50-100 mg Oral Q4H PRN Melrose Nakayama, MD   100 mg at 11/10/21 2006   zolpidem (AMBIEN) tablet 5 mg  5 mg Oral QHS PRN Melrose Nakayama, MD   5 mg at 11/13/21 2115     Discharge Medications: Please see discharge summary for a list of discharge medications.  Relevant Imaging Results:  Relevant Lab Results:   Additional Information SSN#  [id number]  Vinie Sill, LCSW

## 2021-11-14 NOTE — Progress Notes (Signed)
IR notified RN that chest xray results were in. RN paged PA to notify new results in.  Joshua [name]

## 2021-11-14 NOTE — Progress Notes (Addendum)
° °   °  [address]       [place] [address]             [phone]      5 Days Post-Op Procedure(s) (LRB): PACEMAKER IMPLANT (N/A) Subjective:  Patient is doing well.  He has no new complaints.  Working with PT/OT awaiting CIR  Objective: Vital signs in last 24 hours: Temp:  [97.6 F (36.4 C)-98.8 F (37.1 C)] 98.4 F (36.9 C) (01/27 0808) Pulse Rate:  [79-93] 93 (01/27 0808) Cardiac Rhythm: Ventricular paced;A-V Sequential paced (01/26 2100) Resp:  [14-25] 19 (01/27 0808) BP: (90-127)/(55-81) 103/56 (01/27 0808) SpO2:  [76 %-98 %] 92 % (01/27 0808) Weight:  [83.3 kg] 83.3 kg (01/27 0353)  Intake/Output from previous day: 01/26 0701 - 01/27 0700 In: 720 [P.O.:720] Out: 1800 [Urine:1800] Intake/Output this shift: Total I/O In: 240 [P.O.:240] Out: 300 [Urine:300]  General appearance: alert, cooperative, and no distress Heart: regular rate and rhythm Lungs: clear to auscultation bilaterally Abdomen: soft, non-tender; bowel sounds normal; no masses,  no organomegaly Extremities: edema trace Wound: clean and dry, staples in place on sternotomy.Marland Kitchen ecchymosis RLE  Lab Results: Recent Labs    11/12/21 0053 11/13/21 0148  WBC 10.7* 10.0  HGB 10.5* 9.8*  HCT 30.8* 29.6*  PLT 142* 181   BMET:  Recent Labs    11/12/21 0053 11/13/21 0148  NA 129* 132*  K 3.6 4.3  CL 94* 96*  CO2 24 27  GLUCOSE 107* 111*  BUN 14 16  CREATININE 0.94 1.03  CALCIUM 8.5* 8.5*    PT/INR: No results for input(s): LABPROT, INR in the last 72 hours. ABG    Component Value Date/Time   PHART 7.490 (H) 11/06/2021 0925   HCO3 23.5 11/06/2021 0925   TCO2 24 11/06/2021 0925   ACIDBASEDEF 6.0 (H) 11/05/2021 1032   O2SAT 94.0 11/06/2021 0925   CBG (last 3)  No results for input(s): GLUCAP in the last 72 hours.  Assessment/Plan: S/P Procedure(s) (LRB): PACEMAKER IMPLANT (N/A)  CV- Paced rhythm- continue Lopressor, he is hypotensive this morning.. will decrease  Lisinopril to 10 mg daily Pulm- no acute issues, continue IS Renal- creatinine,lytes WNL, weight is at baseline, he has 4 more dosaes of Lasix Deconditioning- working with PT/OT.Marland Kitchen recommending CIR Dispo- patient stable, doing well... will decrease Lisinopril to get a little higher BP... weight is at baseline.. ready for d/c to CIR if once authorization is obtained   LOS: 11 days    Ellwood Handler, PA-C 11/14/2021  Doing well. Has been denied for CIR.  Looking into SNF beds [place]

## 2021-11-14 NOTE — Care Management Important Message (Signed)
Important Message  Patient Details  Name: Joshua [name] MRN: [id number] Date of Birth: 11/25/1935   Medicare Important Message Given:  Yes     Shelda Altes 11/14/2021, 10:34 AM

## 2021-11-14 NOTE — Progress Notes (Signed)
CARDIAC REHAB PHASE I   PRE:  Rate/Rhythm: 87 pacing    BP: sitting 118/70    SaO2: 95 RA  MODE:  Ambulation: 100  ft   POST:  Rate/Rhythm: 105 pacing    BP: sitting 119/64     SaO2: 96 RA  Pt on BSC after BM. Stood with min assist with gait belt and rocking. Ambulated with rollator and min assist for support. Weak legs, small steps, especially with right leg. No LOB or rest. Able to navigate to recliner on other side of room on return. Pt fatigued but able to support himself. VSS.  Discussed IS, sternal and pacer restrictions, and CRPII. Receptive although hard to distinguish the differences in rehab programs and therapists, etc. Will refer to [facility] for after recovery. [place], ACSM 11/14/2021 12:25 PM

## 2021-11-14 NOTE — Progress Notes (Signed)
Mobility Specialist: Progress Note   11/14/21 1636  Mobility  Activity Ambulated with assistance in hallway  Level of Assistance Contact guard assist, steadying assist  Assistive Device Four wheel walker  Distance Ambulated (ft) 140 ft  Activity Response Tolerated well  $Mobility charge 1 Mobility   Pre-Mobility: 86 HR Post-Mobility: 83 HR, 99% SpO2  Pt assisted to BSC, BM successful, then agreeable to ambulation. C/o SOB towards end of ambulation, otherwise asymptomatic. Pt to recliner after walk with call bell and phone in reach. Family present in the room.   [facility] Joshua [name] Mobility Specialist Mobility Specialist 4 [place]: [phone] Mobility Specialist 2 [place] and [place]: [phone]

## 2021-11-14 NOTE — Progress Notes (Signed)
Inpatient Rehabilitation Admissions Coordinator   I have received a denial from [facility] for [place] admit. I met at bedside with patient and spoke with his son, [name] by phone. They are aware and would like to proceed with SNF. I have updated acute team and TOC. [name] would like to be called and involved with SNF process. We will sign off at this time.  Danne Baxter, RN, MSN Rehab Admissions Coordinator [phone] 11/14/2021 2:47 PM

## 2021-11-15 NOTE — TOC Progression Note (Signed)
Transition of Care [facility]) - Progression Note    Patient Details  Name: Joshua [name] MRN: [id number] Date of Birth: May 06, 1936  Transition of Care [facility]) CM/SW [place], [place] Phone Number: [phone] 11/15/2021, 2:38 PM  Clinical Narrative:    CSW provided pt with medicare.gov rating list and the one bed offer from [facility]. CSW explained that more bed offers should come back on Monday.  TOC team will continue to assist with discharge planning needs.    Expected Discharge Plan: IP Rehab Facility Barriers to Discharge: Continued Medical Work up  Expected Discharge Plan and Services Expected Discharge Plan: [place] In-house Referral: NA Discharge Planning Services: CM Consult Post Acute Care Choice: IP Rehab Living arrangements for the past 2 months: Single Family Home                 DME Arranged: N/A DME Agency: NA                   Social Determinants of Health (SDOH) Interventions    Readmission Risk Interventions No flowsheet data found.

## 2021-11-15 NOTE — Progress Notes (Signed)
Patient has had short periods of sleep apnea. Lasting only a few seconds.

## 2021-11-15 NOTE — Progress Notes (Addendum)
° °   °  [address]       [place] [address]             [phone]      6 Days Post-Op Procedure(s) (LRB): PACEMAKER IMPLANT (N/A)  Subjective:  Patient without complaints.  Continues to do well.   Unfortunately, [employer] declined CIR placement.  He is agreeable to SNF placement  Objective: Vital signs in last 24 hours: Temp:  [97.6 F (36.4 C)-99.2 F (37.3 C)] 99.2 F (37.3 C) (01/28 0424) Pulse Rate:  [84-93] 93 (01/28 0424) Cardiac Rhythm: Ventricular paced (01/28 0415) Resp:  [17-20] 20 (01/28 0424) BP: (92-121)/(53-70) 104/62 (01/28 0424) SpO2:  [94 %-97 %] 94 % (01/28 0424) Weight:  [82.5 kg] 82.5 kg (01/28 0424)  Intake/Output from previous day: 01/27 0701 - 01/28 0700 In: 600 [P.O.:600] Out: 300 [Urine:300]  General appearance: alert, cooperative, and no distress Heart: regular rate and rhythm and paced Lungs: clear to auscultation bilaterally Abdomen: soft, non-tender; bowel sounds normal; no masses,  no organomegaly Extremities: edema none present Wound: clean and dry  Lab Results: Recent Labs    11/13/21 0148  WBC 10.0  HGB 9.8*  HCT 29.6*  PLT 181   BMET:  Recent Labs    11/13/21 0148  NA 132*  K 4.3  CL 96*  CO2 27  GLUCOSE 111*  BUN 16  CREATININE 1.03  CALCIUM 8.5*    PT/INR: No results for input(s): LABPROT, INR in the last 72 hours. ABG    Component Value Date/Time   PHART 7.490 (H) 11/06/2021 0925   HCO3 23.5 11/06/2021 0925   TCO2 24 11/06/2021 0925   ACIDBASEDEF 6.0 (H) 11/05/2021 1032   O2SAT 94.0 11/06/2021 0925   CBG (last 3)  No results for input(s): GLUCAP in the last 72 hours.  Assessment/Plan: S/P Procedure(s) (LRB): PACEMAKER IMPLANT (N/A)  CV- paced- on Lopressor, continue Lisinopril at 10 mg daily  Pulm- no acute issues, continue IS Renal- creatinine, and lytes are normal continue Lasix, potassium for now weight is at baseline Deconditioning- PT/OT recs CIR, [employer] decline placement, now  for SNF.. CSW is involved...  Dispo- patient stable, remains stable for d/c once placement can be arranged   LOS: 12 days    Ellwood Handler, PA-C 11/15/2021 Patient examined and CXR images reviewed Pending bed offers from SNF for rehab patient examined and medical record reviewed,agree with above note. Dahlia Byes 11/15/2021

## 2021-11-15 NOTE — Progress Notes (Deleted)
CARDIAC REHAB PHASE I   PRE:  Rate/Rhythm: 81 SR    BP: sitting 103/82    SaO2: 96 RA  MODE:  Ambulation: 400 ft   POST:  Rate/Rhythm: 88 SR    BP: sitting 106/81     SaO2: 97 RA  [id number] Patient ambulated in hallway x 1 assist with RW. Slow steady gait without complaints. Antibiotics continue. Per note patient has not had an offer for SNF to date. Patient encouraged to ambulate with staff 2 more times today as tolerated.   Talyn Eddie Minus Breeding RN, BSN

## 2021-11-15 NOTE — Progress Notes (Signed)
CARDIAC REHAB PHASE I   PRE:  Rate/Rhythm: 80 pacing    BP: sitting 92/57    SaO2: 96 RA  MODE:  Ambulation: 150 ft   POST:  Rate/Rhythm: 96 pacing    BP: sitting 125/54     SaO2: 98 RA  Pt needed verbal cues for sternal precautions. Able to slowly shimmy hips to EOB but needed mod assist to stand. Walked with rollator and gait belt, more upright and stronger steps today. Increased distance. To recliner, VSS. Encouraged IS, walking x1-2 more today. [id number]   [place], ACSM 11/15/2021 11:02 AM

## 2021-11-16 NOTE — Progress Notes (Signed)
Mobility Specialist Progress Note: ° ° 11/16/21 1627  °Mobility  °Activity Ambulated with assistance in hallway  °Level of Assistance Minimal assist, patient does 75% or more  °Assistive Device Four wheel walker  °Distance Ambulated (ft) 150 ft  °Activity Response Tolerated well  °$Mobility charge 1 Mobility  ° °Pt received in bed willing to participate in mobility. No complaints of pain and asymptomatic. Min A to stand then contact guard throughout ambulation. Pt left in bed with call bell in reach and all needs met.  ° °  °Mobility Specialist °Primary Phone [phone] °Secondary Phone [phone] ° °

## 2021-11-16 NOTE — Progress Notes (Addendum)
° °   °  [address]       [place] [address]             [phone]      7 Days Post-Op Procedure(s) (LRB): PACEMAKER IMPLANT (N/A)  Subjective:  No new complaints.  Family at bedside and wanted to speak about placement.  They have offered to private pay for CIR.  Objective: Vital signs in last 24 hours: Temp:  [97.6 F (36.4 C)-99.2 F (37.3 C)] 97.7 F (36.5 C) (01/29 0756) Pulse Rate:  [80-89] 80 (01/29 0756) Cardiac Rhythm: A-V Sequential paced (01/29 0745) Resp:  [16-20] 20 (01/29 0756) BP: (91-108)/(44-71) 106/59 (01/29 0756) SpO2:  [94 %-99 %] 99 % (01/29 0756) Weight:  [82.5 kg] 82.5 kg (01/29 0612)  Intake/Output from previous day: 01/28 0701 - 01/29 0700 In: 360 [P.O.:360] Out: 1000 [Urine:1000]  General appearance: alert, cooperative, and no distress Heart: regular rate and rhythm and paced Lungs: clear to auscultation bilaterally Abdomen: soft, non-tender; bowel sounds normal; no masses,  no organomegaly Extremities: edema none present Wound: clean and dry  Lab Results: No results for input(s): WBC, HGB, HCT, PLT in the last 72 hours. BMET: No results for input(s): NA, K, CL, CO2, GLUCOSE, BUN, CREATININE, CALCIUM in the last 72 hours.  PT/INR: No results for input(s): LABPROT, INR in the last 72 hours. ABG    Component Value Date/Time   PHART 7.490 (H) 11/06/2021 0925   HCO3 23.5 11/06/2021 0925   TCO2 24 11/06/2021 0925   ACIDBASEDEF 6.0 (H) 11/05/2021 1032   O2SAT 94.0 11/06/2021 0925   CBG (last 3)  No results for input(s): GLUCAP in the last 72 hours.  Assessment/Plan: S/P Procedure(s) (LRB): PACEMAKER IMPLANT (N/A)  CV- paced, currently on Lopressor and Lisinopril Pulm- no acute issues, continue IS Renal- weight is below baseline, will stop Lasix, potassium Deconditioning- PT recs CIR, [employer] declined.. patient has offered to private pay, however CIR admission is saying this isn't allowed if patient has insurance... I  will investigate futher Dispo- patient doing well, needs placement.. willing to private pay for CIR since insurance has declined.... SNF placement in works   LOS: 13 days    Ellwood Handler, PA-C 11/16/2021  Sternal incision clean and dry.  No hematoma in pacemaker pocket. Motivated to walk the hallways with mobility team Appears to be ready for discharge to a rehab facility since he lives alone  Dahlia Byes MD

## 2021-11-17 ENCOUNTER — Encounter: Payer: Medicare HMO | Admitting: Surgery

## 2021-11-17 NOTE — Progress Notes (Signed)
Mobility Specialist: Progress Note   11/17/21 1634  Mobility  Activity Transferred from chair to bed  Level of Assistance Minimal assist, patient does 75% or more  Assistive Device  (HHA)  Distance Ambulated (ft) 2 ft  Activity Response Tolerated well  $Mobility charge 1 Mobility   Pt deferred ambulation but requesting to be assisted back to bed. Pt back in bed with call bell and phone at his side.   [facility] Joshua [name] Mobility Specialist Mobility Specialist 4 [place]: [phone] Mobility Specialist 2 [place] and [place]: [phone]

## 2021-11-17 NOTE — Progress Notes (Signed)
Inpatient Rehabilitation Admissions Coordinator   I spoke with son, [name], by phone to discuss their appeal rights with [facility] medicare concerning the denial for Cir. Son, [name] will begin appeal process today. [name] also requested that we explore the option of patient paying privately for CIR admit and what that estimate of cost of care would be. I will discuss with CIR administration that option today. I will follow up.  Danne Baxter, RN, MSN Rehab Admissions Coordinator [phone] 11/17/2021 8:37 AM

## 2021-11-17 NOTE — Care Management Important Message (Signed)
Important Message  Patient Details  Name: Joshua [name] MRN: [id number] Date of Birth: 12/13/1935   Medicare Important Message Given:  Yes     Shelda Altes 11/17/2021, 8:10 AM

## 2021-11-17 NOTE — Progress Notes (Signed)
Occupational Therapy Treatment Patient Details Name: Joshua [name] MRN: [id number] DOB: September 11, 1936 Today's Date: 11/17/2021   History of present illness 86 y.o. male with severe 3-vessel heart disease, admitted 11/04/21 for CABG x3 and AVR. Post-op course complicated by acute blood loss anemia requiring mediastinal re-exploration 1/18. ETT 1/17-1/19. Pt with complete heart block s/p temporary pacemaker insertion 1/20. Pt with permanent pacer placed 1/22. Pt developed bil pneumothorax on 1/23 and bil chest tubes placed.  PMH inlcudes CAD, severe AS, HTN, HLD, arthritis, GERD, cataracts.   OT comments  Pt progressing towards established OT goals. Pt performing oral care at sink with Supervision and cues for compensatory techniques to adhere to sternal precautions. Reviewed techniques for UB and LB dressing and pt verbalized understanding. Son arrived at end of session and reports he can bring clothes for next session. Also reports he is happy to have pt stay with him if needed for transition to home. VSS on RA. Continue to recommend dc to AIR and will continue to follow acutely as admitted.    Recommendations for follow up therapy are one component of a multi-disciplinary discharge planning process, led by the attending physician.  Recommendations may be updated based on patient status, additional functional criteria and insurance authorization.    Follow Up Recommendations  Acute inpatient rehab (3hours/day)    Assistance Recommended at Discharge Frequent or constant Supervision/Assistance  Patient can return home with the following  A little help with walking and/or transfers;A little help with bathing/dressing/bathroom;Assist for transportation   Equipment Recommendations  Tub/shower seat    Recommendations for Other Services      Precautions / Restrictions Precautions Precautions: Fall;Sternal;ICD/Pacemaker Restrictions Weight Bearing Restrictions: No       Mobility Bed  Mobility Overal bed mobility: Needs Assistance Bed Mobility: Rolling, Sit to Sidelying Rolling: Supervision       Sit to sidelying: Min guard General bed mobility comments: Cues for log roll technique    Transfers Overall transfer level: Needs assistance Equipment used: Rolling walker (2 wheels) Transfers: Sit to/from Stand Sit to Stand: Min guard           General transfer comment: Min Guard A for safety with stand to sit     Balance Overall balance assessment: Needs assistance Sitting-balance support: Feet supported, No upper extremity supported Sitting balance-Leahy Scale: Fair     Standing balance support: Bilateral upper extremity supported, Reliant on assistive device for balance, During functional activity Standing balance-Leahy Scale: Poor                             ADL either performed or assessed with clinical judgement   ADL Overall ADL's : Needs assistance/impaired     Grooming: Oral care;Standing;Supervision/safety;Set up Grooming Details (indicate cue type and reason): Providing cues for sternal precautions and compensatory techniques. Supervision for safety while standing at sink           Upper Body Dressing Details (indicate cue type and reason): reviewing UB dressing (pt reports he puts arms in first prior to sx).   Lower Body Dressing Details (indicate cue type and reason): reviewing LB dressing             Functional mobility during ADLs: Min guard;Rolling walker (2 wheels) General ADL Comments: Son arriving at end of session and reports he can bring clothes for next session. Also discussing that pt can call the RN/NT for walking to bathroom to complete toileting - to  increased endurance    Extremity/Trunk Assessment Upper Extremity Assessment Upper Extremity Assessment: Generalized weakness   Lower Extremity Assessment Lower Extremity Assessment: Defer to PT evaluation        Vision       Perception     Praxis       Cognition Arousal/Alertness: Awake/alert Behavior During Therapy: WFL for tasks assessed/performed Overall Cognitive Status: Impaired/Different from baseline Area of Impairment: Memory                     Memory: Decreased short-term memory, Decreased recall of precautions         General Comments: continued cues for sternal precautions        Exercises      Shoulder Instructions       General Comments VSS on RA. Son arriving at end of session. discussing different dc options. Son reports he would gladly let his father stay with him at dc if he is strong enough to dc to home.    Pertinent Vitals/ Pain       Pain Assessment Pain Assessment: Faces Faces Pain Scale: Hurts a little bit Pain Location: chest incision Pain Descriptors / Indicators: Operative site guarding Pain Intervention(s): Monitored during session, Limited activity within patient's tolerance, Repositioned  Home Living                                          Prior Functioning/Environment              Frequency  Min 2X/week        Progress Toward Goals  OT Goals(current goals can now be found in the care plan section)  Progress towards OT goals: Progressing toward goals  Acute Rehab OT Goals OT Goal Formulation: With patient Time For Goal Achievement: 11/24/21 Potential to Achieve Goals: Good ADL Goals Pt Will Perform Grooming: with min guard assist;standing Pt Will Perform Upper Body Bathing: with set-up;sitting Pt Will Perform Lower Body Bathing: with min assist Pt Will Perform Lower Body Dressing: with min assist Pt Will Transfer to Toilet: with min guard assist;ambulating;bedside commode Pt Will Perform Toileting - Clothing Manipulation and hygiene: with min guard assist;sit to/from stand Additional ADL Goal #1: Pt will be S in and OOB for basic ADLs (following sternal and pacemaker precautions)  Plan Discharge plan remains appropriate     Co-evaluation                 AM-PAC OT "6 Clicks" Daily Activity     Outcome Measure   Help from another person eating meals?: None Help from another person taking care of personal grooming?: A Little Help from another person toileting, which includes using toliet, bedpan, or urinal?: A Lot Help from another person bathing (including washing, rinsing, drying)?: A Lot Help from another person to put on and taking off regular upper body clothing?: A Lot Help from another person to put on and taking off regular lower body clothing?: A Little 6 Click Score: 16    End of Session Equipment Utilized During Treatment: Rolling walker (2 wheels);Gait belt  OT Visit Diagnosis: Unsteadiness on feet (R26.81);Other abnormalities of gait and mobility (R26.89);Muscle weakness (generalized) (M62.81);Pain   Activity Tolerance Patient tolerated treatment well   Patient Left in bed;with call bell/phone within reach;with family/visitor present   Nurse Communication Mobility status        Time:  8335-8251 OT Time Calculation (min): 15 min  Charges: OT General Charges $OT Visit: 1 Visit OT Treatments $Self Care/Home Management : 8-22 mins  [place], OTR/L Acute Rehab Pager: [phone] Office: [place] 11/17/2021, 10:34 AM

## 2021-11-17 NOTE — Progress Notes (Signed)
CARDIAC REHAB PHASE I   PRE:  Rate/Rhythm: 80 pacing    BP: sitting 89/61    SaO2:   MODE:  Ambulation: 150 ft   POST:  Rate/Rhythm: 94 pacing    BP: sitting 128/65     SaO2: 94 RA  Pt motivated to ambulate. Needed verbal cues on how to get hips to EOB. Still uses arms at times. Stood with min assist with gait belt and ambulated with rollator, standby assist. No major c/o. To recliner after walk. VSS. [id number]   Darrick Meigs CES, ACSM 11/17/2021 2:24 PM

## 2021-11-17 NOTE — Progress Notes (Addendum)
° °   °  [address] [address]bloSuite [address]       [place] [address]             [phone]      8 Days Post-Op Procedure(s) (LRB): PACEMAKER IMPLANT (N/A)  Subjective:  Has no new complaints.  Awaiting answer for private pay at CIR  Objective: Vital signs in last 24 hours: Temp:  [97.7 F (36.5 C)-100 F (37.8 C)] 100 F (37.8 C) (01/30 0431) Pulse Rate:  [80-86] 80 (01/30 0431) Cardiac Rhythm: Ventricular paced (01/29 1956) Resp:  [10-28] 20 (01/30 0431) BP: (100-125)/(54-81) 125/70 (01/30 0431) SpO2:  [91 %-100 %] 93 % (01/30 0431) Weight:  [80.6 kg] 80.6 kg (01/30 0431)  Intake/Output from previous day: 01/29 0701 - 01/30 0700 In: 120 [P.O.:120] Out: 800 [Urine:800]  General appearance: alert, cooperative, and no distress Heart: regular rate and rhythm Lungs: clear to auscultation bilaterally Abdomen: soft, non-tender; bowel sounds normal; no masses,  no organomegaly Extremities: edema trace Wound: clean and dry  Lab Results: No results for input(s): WBC, HGB, HCT, PLT in the last 72 hours. BMET: No results for input(s): NA, K, CL, CO2, GLUCOSE, BUN, CREATININE, CALCIUM in the last 72 hours.  PT/INR: No results for input(s): LABPROT, INR in the last 72 hours. ABG    Component Value Date/Time   PHART 7.490 (H) 11/06/2021 0925   HCO3 23.5 11/06/2021 0925   TCO2 24 11/06/2021 0925   ACIDBASEDEF 6.0 (H) 11/05/2021 1032   O2SAT 94.0 11/06/2021 0925   CBG (last 3)  No results for input(s): GLUCAP in the last 72 hours.  Assessment/Plan: S/P Procedure(s) (LRB): PACEMAKER IMPLANT (N/A)  CV- Paced, BP stable- continue Lopressor, Lisinopril Pulm- off oxygen, continue IS Renal- creatinine stable, weight is at baseline, stop Lasix Deconditioning- PT/OT recs CIR, [employer] denied.. patient is willing to private pay, I am trying to get this arranged Dispo- patient stable, dispo planning   LOS: 14 days   Ellwood Handler, PA-C 11/17/2021   Agree with above. Continue  physical therapy Dispo planning.  Ashur Glatfelter Bary Leriche

## 2021-11-17 NOTE — Progress Notes (Signed)
Pt ambulated x 250 feet around unit with front wheel walker pt tolerated well.

## 2021-11-17 NOTE — Progress Notes (Signed)
Physical Therapy Treatment Patient Details Name: Joshua [name] MRN: [id number] DOB: 07/06/1936 Today's Date: 11/17/2021   History of Present Illness Pt is an 86 y.o. male with severe 3-vessel heart disease, admitted 11/04/21 for CABG x3 and AVR. Post-op course complicated by acute blood loss anemia requiring mediastinal re-exploration 1/18. ETT 1/17-1/19. Pt with complete heart block s/p temporary pacemaker insertion 1/20. Pt with permanent pacer placed 1/22. Pt developed bil pneumothorax on 1/23 and bil chest tubes placed.  PMH inlcudes CAD, severe AS, HTN, HLD, arthritis, GERD, cataracts.    PT Comments    Pt progressing steadily towards their physical therapy goals. Pt motivated to participate with the goal of getting stronger. Session focused on therapeutic exercises for BLE strengthening and cervical/scapular ROM. Pt requiring moderate assist for bed mobility and minimal assist for transfers and ambulation. Pt ambulating limited hallway distances with a Rollator. Needs continued cues for sternal precautions and upper trunk control. Would also likely benefit from stretching. Will continue to progress as tolerated.    Recommendations for follow up therapy are one component of a multi-disciplinary discharge planning process, led by the attending physician.  Recommendations may be updated based on patient status, additional functional criteria and insurance authorization.  Follow Up Recommendations  Acute inpatient rehab (3hours/day)     Assistance Recommended at Discharge Intermittent Supervision/Assistance  Patient can return home with the following A little help with walking and/or transfers;A little help with bathing/dressing/bathroom;Assistance with cooking/housework;Assist for transportation   Equipment Recommendations  None recommended by PT    Recommendations for Other Services       Precautions / Restrictions Precautions Precautions:  Fall;Sternal;ICD/Pacemaker Restrictions Weight Bearing Restrictions: No     Mobility  Bed Mobility Overal bed mobility: Needs Assistance Bed Mobility: Rolling, Sidelying to Sit Rolling: Supervision Sidelying to sit: Mod assist       General bed mobility comments: Pt able to bring legs off edge of bed, assist for trunk to upright and use of bed pad to scoot hips forward. Pt attempting to pull on bed rails, needs reminders for sternal precautions    Transfers Overall transfer level: Needs assistance Equipment used: Rollator (4 wheels) Transfers: Sit to/from Stand Sit to Stand: Min assist           General transfer comment: Cues for placing hands on knees, minA to power up to standing position    Ambulation/Gait Ambulation/Gait assistance: Min assist Gait Distance (Feet): 160 Feet Assistive device: Rollator (4 wheels) Gait Pattern/deviations: Step-through pattern, Decreased stride length, Decreased dorsiflexion - right, Decreased dorsiflexion - left, Trunk flexed Gait velocity: Decreased     General Gait Details: Cues for walker proximity, increased bilateral foot clearance, activity pacing, and scapular retraction. MinA for balance; needs assist for obstacle negotiation as well   Stairs             Wheelchair Mobility    Modified Rankin (Stroke Patients Only)       Balance Overall balance assessment: Needs assistance Sitting-balance support: Feet supported, No upper extremity supported Sitting balance-Leahy Scale: Fair     Standing balance support: Bilateral upper extremity supported, Reliant on assistive device for balance, During functional activity Standing balance-Leahy Scale: Poor                              Cognition Arousal/Alertness: Awake/alert Behavior During Therapy: WFL for tasks assessed/performed Overall Cognitive Status: Impaired/Different from baseline Area of Impairment: Memory  Memory:  Decreased short-term memory, Decreased recall of precautions         General Comments: continued cues for sternal precautions        Exercises General Exercises - Lower Extremity Long Arc Quad: Both, 10 reps, Seated Heel Slides: Both, 5 reps, Supine Straight Leg Raises: Both, 5 reps, Supine Hip Flexion/Marching: Both, 5 reps, Seated Other Exercises Other Exercises: Standing: cervical rotations x 5, shoulder shrugs x 10, scapular retractions x 10    General Comments        Pertinent Vitals/Pain Pain Assessment Pain Assessment: Faces Faces Pain Scale: Hurts a little bit Pain Location: chest incision Pain Descriptors / Indicators: Operative site guarding Pain Intervention(s): Monitored during session    Home Living                          Prior Function            PT Goals (current goals can now be found in the care plan section) Acute Rehab PT Goals Patient Stated Goal: to walk Potential to Achieve Goals: Good Progress towards PT goals: Progressing toward goals    Frequency    Min 3X/week      PT Plan Current plan remains appropriate    Co-evaluation              AM-PAC PT "6 Clicks" Mobility   Outcome Measure  Help needed turning from your back to your side while in a flat bed without using bedrails?: A Little Help needed moving from lying on your back to sitting on the side of a flat bed without using bedrails?: A Lot Help needed moving to and from a bed to a chair (including a wheelchair)?: A Little Help needed standing up from a chair using your arms (e.g., wheelchair or bedside chair)?: A Little Help needed to walk in hospital room?: A Little Help needed climbing 3-5 steps with a railing? : A Lot 6 Click Score: 16    End of Session Equipment Utilized During Treatment: Gait belt Activity Tolerance: Patient tolerated treatment well Patient left: Other (comment) (standing at sink with OT) Nurse Communication: Mobility status PT  Visit Diagnosis: Unsteadiness on feet (R26.81);Other abnormalities of gait and mobility (R26.89);Muscle weakness (generalized) (M62.81)     Time: 7416-3845 PT Time Calculation (min) (ACUTE ONLY): 17 min  Charges:  $Gait Training: 8-22 mins                     Wyona Almas, PT, DPT Acute Rehabilitation Services Pager [phone] Office [phone]    Deno Etienne 11/17/2021, 9:53 AM

## 2021-11-18 DIAGNOSIS — Z952 Presence of prosthetic heart valve: Secondary | ICD-10-CM

## 2021-11-18 MED ORDER — LOPERAMIDE HCL 2 MG PO CAPS: 2.0000 mg | ORAL_CAPSULE | ORAL | Status: DC | PRN

## 2021-11-18 NOTE — Progress Notes (Signed)
Occupational Therapy Treatment Patient Details Name: Joshua [name] MRN: [id number] DOB: 1936/07/18 Today's Date: 11/18/2021   History of present illness 86 y.o. male with severe 3-vessel heart disease, admitted 11/04/21 for CABG x3 and AVR. Post-op course complicated by acute blood loss anemia requiring mediastinal re-exploration 1/18. ETT 1/17-1/19. Pt with complete heart block s/p temporary pacemaker insertion 1/20. Pt with permanent pacer placed 1/22. Pt developed bil pneumothorax on 1/23 and bil chest tubes placed.  PMH inlcudes CAD, severe AS, HTN, HLD, arthritis, GERD, cataracts.   OT comments  Pt progressing towards established OT goals. Providing education on compensatory techniques for UB and LB dressing techniques. Pt donning shirt and underwear with Min Guard and cues for maintaining sternal precautions. Pt requiring Min Guard during functional mobility with RW. Discussed dc options with home vs AIR; pt reporting concern with dc to his son's home as they have four large dogs. If pt does not get insurance approval for therapy, recommend dc to home with [place] and [place] aide for assistance with IADLs and providing supervision. Will continue to follow acutely as admitted.    Recommendations for follow up therapy are one component of a multi-disciplinary discharge planning process, led by the attending physician.  Recommendations may be updated based on patient status, additional functional criteria and insurance authorization.    Follow Up Recommendations  Home health OT (with home support) - Family still discussing rehab needs for increased support and therapy.    Assistance Recommended at Discharge Frequent or constant Supervision/Assistance  Patient can return home with the following  A little help with walking and/or transfers;A little help with bathing/dressing/bathroom;Assist for transportation;Assistance with cooking/housework   Equipment Recommendations  Tub/shower seat     Recommendations for Other Services      Precautions / Restrictions Precautions Precautions: Sternal;Fall;ICD/Pacemaker Precaution Comments: drainage from L-side chest tube removal site Restrictions Weight Bearing Restrictions: No Other Position/Activity Restrictions: sternal       Mobility Bed Mobility Overal bed mobility: Modified Independent Bed Mobility: Supine to Sit Rolling: Supervision Sidelying to sit: Mod assist Supine to sit: Modified independent (Device/Increase time)   Sit to sidelying: Min guard General bed mobility comments: In recliner upon arrival    Transfers Overall transfer level: Needs assistance Equipment used: Rolling walker (2 wheels) Transfers: Sit to/from Stand Sit to Stand: Min guard           General transfer comment: Min Guard A for safety with stand to sit Transfer via Lift Equipment: Stedy   Balance Overall balance assessment: Modified Independent Sitting-balance support: Feet supported Sitting balance-Leahy Scale: Good Sitting balance - Comments: min guard assist to sit EOB. Postural control: Right lateral lean Standing balance support: Bilateral upper extremity supported, During functional activity, Reliant on assistive device for balance Standing balance-Leahy Scale: Fair Standing balance comment: reliant on UE support                           ADL either performed or assessed with clinical judgement   ADL Overall ADL's : Needs assistance/impaired Eating/Feeding: Independent;Sitting   Grooming: Oral care;Standing;Supervision/safety;Set up Grooming Details (indicate cue type and reason): Providing cues for sternal precautions and compensatory techniques. Supervision for safety while standing at sink Upper Body Bathing: Minimal assistance;Sitting   Lower Body Bathing: Maximal assistance Lower Body Bathing Details (indicate cue type and reason): Mod A +2 sit<stand Upper Body Dressing : Min guard;Cueing for  sequencing;Cueing for safety;Sitting Upper Body Dressing Details (indicate cue type  and reason): Continues to requiring cues for keeping elbows close to body. Overall able to sequence. CUes for bringing one hand back at a time Lower Body Dressing: Min guard;Sit to/from stand Lower Body Dressing Details (indicate cue type and reason): Cues for compensatory techniques Toilet Transfer: Min guard;Ambulation Toilet Transfer Details (indicate cue type and reason): Min A for power up and posterior lean. cues for more weight hsift forward but pt would benefit from increased practice Toileting- Clothing Manipulation and Hygiene: Maximal assistance Toileting - Clothing Manipulation Details (indicate cue type and reason): Mod A +2 sit<stand with sara stedy     Functional mobility during ADLs: Min guard;Rolling walker (2 wheels) General ADL Comments: Focused session on dressing techniques to adhere to precautions    Extremity/Trunk Assessment Upper Extremity Assessment Upper Extremity Assessment: Generalized weakness   Lower Extremity Assessment Lower Extremity Assessment: Defer to PT evaluation        Vision       Perception     Praxis      Cognition Arousal/Alertness: Awake/alert Behavior During Therapy: WFL for tasks assessed/performed Overall Cognitive Status: No family/caregiver present to determine baseline cognitive functioning Area of Impairment: Memory                   Current Attention Level: Selective Memory: Decreased short-term memory, Decreased recall of precautions Following Commands: Follows one step commands consistently     Problem Solving: Requires verbal cues, Slow processing General Comments: continued cues for sternal precautions        Exercises Exercises: General Lower Extremity, Other exercises General Exercises - Lower Extremity Ankle Circles/Pumps: AROM, Both, 10 reps, Supine Long Arc Quad: Both, 10 reps, Seated Heel Slides: Both, 5 reps,  Supine Straight Leg Raises: Both, 5 reps, Supine Hip Flexion/Marching: Both, 5 reps, Seated Other Exercises Other Exercises: Standing: cervical rotations x 5, shoulder shrugs x 10, scapular retractions x 10    Shoulder Instructions       General Comments VSS    Pertinent Vitals/ Pain       Pain Assessment Pain Assessment: Faces Faces Pain Scale: Hurts a little bit Pain Location: chest incision Pain Descriptors / Indicators: Operative site guarding Pain Intervention(s): Monitored during session, Limited activity within patient's tolerance, Repositioned  Home Living                                          Prior Functioning/Environment              Frequency  Min 2X/week        Progress Toward Goals  OT Goals(current goals can now be found in the care plan section)  Progress towards OT goals: Progressing toward goals  Acute Rehab OT Goals OT Goal Formulation: With patient Time For Goal Achievement: 11/24/21 Potential to Achieve Goals: Good ADL Goals Pt Will Perform Grooming: with min guard assist;standing Pt Will Perform Upper Body Bathing: with set-up;sitting Pt Will Perform Lower Body Bathing: with min assist Pt Will Perform Lower Body Dressing: with min assist Pt Will Transfer to Toilet: with min guard assist;ambulating;bedside commode Pt Will Perform Toileting - Clothing Manipulation and hygiene: with min guard assist;sit to/from stand Additional ADL Goal #1: Pt will be S in and OOB for basic ADLs (following sternal and pacemaker precautions)  Plan Discharge plan needs to be updated    Co-evaluation    PT/OT/SLP Co-Evaluation/Treatment: Yes  AM-PAC OT "6 Clicks" Daily Activity     Outcome Measure   Help from another person eating meals?: None Help from another person taking care of personal grooming?: A Little Help from another person toileting, which includes using toliet, bedpan, or urinal?: A Lot Help from  another person bathing (including washing, rinsing, drying)?: A Lot Help from another person to put on and taking off regular upper body clothing?: A Lot Help from another person to put on and taking off regular lower body clothing?: A Little 6 Click Score: 16    End of Session Equipment Utilized During Treatment: Rollator (4 wheels)  OT Visit Diagnosis: Unsteadiness on feet (R26.81);Other abnormalities of gait and mobility (R26.89);Muscle weakness (generalized) (M62.81);Pain Pain - part of body:  (back)   Activity Tolerance Patient tolerated treatment well   Patient Left with call bell/phone within reach;in chair   Nurse Communication Mobility status        Time: 6226-3335 OT Time Calculation (min): 24 min  Charges: OT General Charges $OT Visit: 1 Visit OT Treatments $Self Care/Home Management : 23-37 mins  [place], OTR/L Acute Rehab Pager: [phone] Office: [place] 11/18/2021, 4:08 PM

## 2021-11-18 NOTE — Consult Note (Addendum)
° °  [facility] Inpatient Consult   11/18/2021  Joshua [name] [birth year] [id number]  [facility] [ACO] Patient: [facility] Medicare  Primary Care Provider:  Shelda Pal, DO  [facility] is an embedded provider with a Chronic Care Management team and program, and is listed for the transition of care follow up and appointments.  Patient was screened for hospital length of stay and for Embedded practice service needs for chronic care management and patient will have Transition of Care. Record reviewed for disposition which is being plan between home with HH vs. CIR needs.  Plan: Continue to follow for progression and disposition.  A referral can be sent to the [place] Management team and make aware of TOC needs for post hospital pending disposition.  Please contact for further questions,  Natividad Brood, RN BSN [facility] Liaison  [phone] business mobile phone Toll free office [phone]  Fax number: [phone] [email or web address]

## 2021-11-18 NOTE — Progress Notes (Signed)
Physical Therapy Treatment Patient Details Name: Joshua [name] MRN: [id number] DOB: 01/11/1936 Today's Date: 11/18/2021   History of Present Illness 86 y.o. male with severe 3-vessel heart disease, admitted 11/04/21 for CABG x3 and AVR. Post-op course complicated by acute blood loss anemia requiring mediastinal re-exploration 1/18. ETT 1/17-1/19. Pt with complete heart block s/p temporary pacemaker insertion 1/20. Pt with permanent pacer placed 1/22. Pt developed bil pneumothorax on 1/23 and bil chest tubes placed.  PMH inlcudes CAD, severe AS, HTN, HLD, arthritis, GERD, cataracts.    PT Comments    Patient agrees to PT session. Sleeping lightly in bed upon arrival. He is mod independent with bed mobility and cues for sternal precautions. Patient required min guard and cues for safety with transfers. Ambulated 220 feet with 4 wheeled walker and min guard/supervision. No lob. Cues for increased step length and posture. Patient making good progress with mobility and will continue to benefit from skilled PT while here to improve strength and independence.     Recommendations for follow up therapy are one component of a multi-disciplinary discharge planning process, led by the attending physician.  Recommendations may be updated based on patient status, additional functional criteria and insurance authorization.  Follow Up Recommendations  Acute inpatient rehab (3hours/day)     Assistance Recommended at Discharge Intermittent Supervision/Assistance  Patient can return home with the following A little help with walking and/or transfers;A little help with bathing/dressing/bathroom;Assistance with cooking/housework;Assist for transportation   Equipment Recommendations  Rollator (4 wheels)    Recommendations for Other Services       Precautions / Restrictions Precautions Precautions: Sternal;Fall;ICD/Pacemaker Restrictions Weight Bearing Restrictions: No Other Position/Activity  Restrictions: sternal     Mobility  Bed Mobility Overal bed mobility: Modified Independent Bed Mobility: Supine to Sit     Supine to sit: Modified independent (Device/Increase time)     General bed mobility comments: Cues for log roll technique    Transfers Overall transfer level: Needs assistance Equipment used: Rolling walker (2 wheels) Transfers: Sit to/from Stand Sit to Stand: Min guard           General transfer comment: Min Guard A for safety with stand to sit    Ambulation/Gait Ambulation/Gait assistance: Supervision Gait Distance (Feet): 220 Feet Assistive device: Rollator (4 wheels) Gait Pattern/deviations: Step-through pattern, Decreased stride length, Decreased dorsiflexion - right, Decreased dorsiflexion - left, Trunk flexed Gait velocity: Decreased     General Gait Details: supervision for ambulation. Cues for increased step length, posture   Stairs             Wheelchair Mobility    Modified Rankin (Stroke Patients Only)       Balance Overall balance assessment: Modified Independent Sitting-balance support: Feet supported Sitting balance-Leahy Scale: Good     Standing balance support: Bilateral upper extremity supported, During functional activity, Reliant on assistive device for balance Standing balance-Leahy Scale: Fair Standing balance comment: reliant on UE support                            Cognition Arousal/Alertness: Awake/alert Behavior During Therapy: WFL for tasks assessed/performed Overall Cognitive Status: No family/caregiver present to determine baseline cognitive functioning Area of Impairment: Memory                   Current Attention Level: Selective Memory: Decreased short-term memory, Decreased recall of precautions Following Commands: Follows one step commands consistently     Problem Solving: Requires  verbal cues, Slow processing General Comments: continued cues for sternal precautions         Exercises      General Comments        Pertinent Vitals/Pain Pain Assessment Pain Assessment: No/denies pain    Home Living                          Prior Function            PT Goals (current goals can now be found in the care plan section) Acute Rehab PT Goals Patient Stated Goal: to improve mobility PT Goal Formulation: With patient Time For Goal Achievement: 11/24/21 Potential to Achieve Goals: Good Progress towards PT goals: Progressing toward goals    Frequency    Min 3X/week      PT Plan Current plan remains appropriate    Co-evaluation              AM-PAC PT "6 Clicks" Mobility   Outcome Measure  Help needed turning from your back to your side while in a flat bed without using bedrails?: None Help needed moving from lying on your back to sitting on the side of a flat bed without using bedrails?: A Little Help needed moving to and from a bed to a chair (including a wheelchair)?: A Little Help needed standing up from a chair using your arms (e.g., wheelchair or bedside chair)?: A Little Help needed to walk in hospital room?: A Little Help needed climbing 3-5 steps with a railing? : A Lot 6 Click Score: 18    End of Session Equipment Utilized During Treatment: Gait belt Activity Tolerance: Patient tolerated treatment well Patient left: in chair;with call bell/phone within reach Nurse Communication: Mobility status PT Visit Diagnosis: Unsteadiness on feet (R26.81);Other abnormalities of gait and mobility (R26.89);Muscle weakness (generalized) (M62.81)     Time: 4920-1007 PT Time Calculation (min) (ACUTE ONLY): 16 min  Charges:  $Gait Training: 8-22 mins                     Melysa Schroyer, PT, GCS 11/18/21,2:15 PM

## 2021-11-18 NOTE — Progress Notes (Signed)
CARDIAC REHAB PHASE I   PRE:  Rate/Rhythm: 80 pacing    BP: sitting 91/71    SaO2: 96 RA  MODE:  Ambulation: 220 ft   POST:  Rate/Rhythm: 83 pacing    BP: sitting 120/64     SaO2: 94 RA  Pt up in recliner. Needed verbal cues for hand placement and min assist to stand. Stood for 3 min while monitor was replaced. Ambulated with rollator and standby assist, no real c/o. Increased distance. To bed after walk, needs help with problem solving at times.VSS. [id number]   [place], ACSM 11/18/2021 9:38 AM

## 2021-11-18 NOTE — Progress Notes (Addendum)
Inpatient Rehabilitation Admissions Coordinator   I continue to await [place] as well as clarification from [place] if private pay admission could be arranged. I spoke with son, [name], by phone and he is aware of process pending.  Danne Baxter, RN, MSN Rehab Admissions Coordinator [phone] 11/18/2021 12:21 PM

## 2021-11-18 NOTE — Progress Notes (Signed)
Mobility Specialist: Progress Note ° ° 11/18/21 1720  °Mobility  °Bed Position Chair  °Activity Ambulated with assistance in hallway  °Level of Assistance Minimal assist, patient does 75% or more  °Assistive Device Front wheel walker  °Distance Ambulated (ft) 220 ft  °Activity Response Tolerated well  °$Mobility charge 1 Mobility  ° °Pre-Mobility: 80 HR °Post-Mobility: 80 HR ° °Received pt in chair having no complaints and agreeable to mobility. Asymptomatic throughout ambulation, returned back to chair w/ call bell in reach and all needs met. ° °  °Mobility Specialist °Mobility Specialist 4 East: [phone] °Mobility Specialist 2 Central and 6 East: [phone] ° °

## 2021-11-18 NOTE — Progress Notes (Addendum)
° °   °  [address]       [place] [address]             [phone]      9 Days Post-Op Procedure(s) (LRB): PACEMAKER IMPLANT (N/A)  Subjective:  Patient request stool softeners be stopped.  He is now having some diarrhea.    Objective: Vital signs in last 24 hours: Temp:  [97.7 F (36.5 C)-99.8 F (37.7 C)] 99.7 F (37.6 C) (01/31 0410) Pulse Rate:  [79-85] 79 (01/31 0409) Cardiac Rhythm: Ventricular paced (01/31 0100) Resp:  [17-20] 20 (01/31 0409) BP: (92-110)/(53-82) 106/54 (01/31 0409) SpO2:  [93 %-98 %] 93 % (01/31 0409) Weight:  [80.6 kg] 80.6 kg (01/31 0500)  Intake/Output from previous day: 01/30 0701 - 01/31 0700 In: 603 [P.O.:600; I.V.:3] Out: 600 [Urine:600]  General appearance: alert, cooperative, and no distress Heart: regular rate and rhythm and paced Lungs: clear to auscultation bilaterally Abdomen: soft, non-tender; bowel sounds normal; no masses,  no organomegaly Extremities: edema trace Wound: clean and dry  Lab Results: No results for input(s): WBC, HGB, HCT, PLT in the last 72 hours. BMET: No results for input(s): NA, K, CL, CO2, GLUCOSE, BUN, CREATININE, CALCIUM in the last 72 hours.  PT/INR: No results for input(s): LABPROT, INR in the last 72 hours. ABG    Component Value Date/Time   PHART 7.490 (H) 11/06/2021 0925   HCO3 23.5 11/06/2021 0925   TCO2 24 11/06/2021 0925   ACIDBASEDEF 6.0 (H) 11/05/2021 1032   O2SAT 94.0 11/06/2021 0925   CBG (last 3)  No results for input(s): GLUCAP in the last 72 hours.  Assessment/Plan: S/P Procedure(s) (LRB): PACEMAKER IMPLANT (N/A)  CV- Paced, BP remains stable on Lopressor, Lisinopril Pulm- no acute issues, continue IS Deconditioning PT/OT recs CIR.[place] [place] [employer] declined, family is appealings.  They are also willing to private pay, this is being investigated as well Dispo- patient stable, dispo planning   LOS: 15 days    Ellwood Handler, PA-C 11/18/2021   Agree with  above Dispo Planning.  Leimomi Zervas Bary Leriche

## 2021-11-18 NOTE — Progress Notes (Addendum)
Inpatient Rehabilitation Admissions Coordinator   I met with patient at bedside as we had conference call with his son, [name]. I discussed that we continue to await [employer] appeal determination. We also discussed that he has progressed very well with therapy today and is Min guard with transfers and supervision 220 feet for ambulation. Has progressed very well and that with caregivers supports at home, could receive outpatient or Home health supports. But, if patient would prefer admit to CIR, Rehab administration has approved admit and I reviewed cost of care per day. This Estimate of daily care would have to be paid prior to admit in full. Randy and patient to discuss overnight and then I will follow up tomorrow morning with their decision. TOC made aware.  Danne Baxter, RN, MSN Rehab Admissions Coordinator [phone] 11/18/2021 3:51 PM

## 2021-11-19 NOTE — Progress Notes (Signed)
Physical Therapy Treatment Patient Details Name: Joshua [name] MRN: [id number] DOB: July 19, 1936 Today's Date: 11/19/2021   History of Present Illness 86 y.o. male with severe 3-vessel heart disease, admitted 11/04/21 for CABG x3 and AVR. Post-op course complicated by acute blood loss anemia requiring mediastinal re-exploration 1/18. ETT 1/17-1/19. Pt with complete heart block s/p temporary pacemaker insertion 1/20. Pt with permanent pacer placed 1/22. Pt developed bil pneumothorax on 1/23 and bil chest tubes placed.  PMH inlcudes CAD, severe AS, HTN, HLD, arthritis, GERD, cataracts.    PT Comments    Pt agreeable for physical therapy session. Focus on exercises for strengthening/ROM and progressive mobility. Pt requiring moderate assist to stand from lower surface. Ambulating limited hallway distances with a walker at a min guard assist level. Gait speed of 1.05 ft/s indicates he is at high risk for falls. In light of deficits and decreased caregiver burden. Recommend SNF at discharge.    Recommendations for follow up therapy are one component of a multi-disciplinary discharge planning process, led by the attending physician.  Recommendations may be updated based on patient status, additional functional criteria and insurance authorization.  Follow Up Recommendations  Skilled nursing-short term rehab (<3 hours/day)     Assistance Recommended at Discharge Intermittent Supervision/Assistance  Patient can return home with the following A little help with walking and/or transfers;A little help with bathing/dressing/bathroom;Assistance with cooking/housework;Assist for transportation   Equipment Recommendations  Rollator (4 wheels)    Recommendations for Other Services       Precautions / Restrictions Precautions Precautions: Sternal;Fall;ICD/Pacemaker Precaution Comments: drainage from L-side chest tube removal site Restrictions Weight Bearing Restrictions: No Other Position/Activity  Restrictions: sternal     Mobility  Bed Mobility Overal bed mobility: Modified Independent                  Transfers Overall transfer level: Needs assistance Equipment used: Rollator (4 wheels) Transfers: Sit to/from Stand Sit to Stand: Mod assist           General transfer comment: Cues for scooting forward to edge of chair, hands on knees, rocking forward to gain momentum. ModA to rise from lower surface    Ambulation/Gait Ambulation/Gait assistance: Min guard Gait Distance (Feet): 200 Feet Assistive device: Rollator (4 wheels) Gait Pattern/deviations: Step-through pattern, Decreased stride length, Knee flexed in stance - right, Knee flexed in stance - left, Decreased dorsiflexion - right, Decreased dorsiflexion - left Gait velocity: 1.05 ft/s Gait velocity interpretation: <1.31 ft/sec, indicative of household ambulator   General Gait Details: Pt with decreased bilateral foot clearance, heel strike at initial contact, increased trunk flexion.   Stairs             Wheelchair Mobility    Modified Rankin (Stroke Patients Only)       Balance Overall balance assessment: Needs assistance Sitting-balance support: Feet supported Sitting balance-Leahy Scale: Good     Standing balance support: Single extremity supported, During functional activity Standing balance-Leahy Scale: Poor Standing balance comment: reliant on at least single UE support                            Cognition Arousal/Alertness: Awake/alert Behavior During Therapy: WFL for tasks assessed/performed Overall Cognitive Status: No family/caregiver present to determine baseline cognitive functioning Area of Impairment: Memory                   Current Attention Level: Selective Memory: Decreased short-term memory, Decreased recall of  precautions Following Commands: Follows one step commands consistently     Problem Solving: Requires verbal cues, Slow  processing General Comments: requires continued cues for sternal precautions        Exercises General Exercises - Lower Extremity Hip ABduction/ADduction: Both, Standing, 10 reps Other Exercises Other Exercises: Standing: cervical rotations x 5, shoulder shrugs x 10, single shoulder shrugs x 10    General Comments        Pertinent Vitals/Pain Pain Assessment Pain Assessment: Faces Faces Pain Scale: Hurts a little bit Pain Location: chest incision Pain Descriptors / Indicators: Operative site guarding Pain Intervention(s): Monitored during session    Home Living                          Prior Function            PT Goals (current goals can now be found in the care plan section) Acute Rehab PT Goals Patient Stated Goal: to improve mobility Progress towards PT goals: Progressing toward goals    Frequency    Min 3X/week      PT Plan Discharge plan needs to be updated    Co-evaluation              AM-PAC PT "6 Clicks" Mobility   Outcome Measure  Help needed turning from your back to your side while in a flat bed without using bedrails?: None Help needed moving from lying on your back to sitting on the side of a flat bed without using bedrails?: A Little Help needed moving to and from a bed to a chair (including a wheelchair)?: A Lot Help needed standing up from a chair using your arms (e.g., wheelchair or bedside chair)?: A Little Help needed to walk in hospital room?: A Little Help needed climbing 3-5 steps with a railing? : A Lot 6 Click Score: 17    End of Session   Activity Tolerance: Patient tolerated treatment well Patient left: in bed;with call bell/phone within reach Nurse Communication: Mobility status PT Visit Diagnosis: Unsteadiness on feet (R26.81);Other abnormalities of gait and mobility (R26.89);Muscle weakness (generalized) (M62.81)     Time: 5537-4827 PT Time Calculation (min) (ACUTE ONLY): 24 min  Charges:  $Therapeutic  Exercise: 8-22 mins                     Wyona Almas, PT, DPT Acute Rehabilitation Services Pager [phone] Office [phone]    Deno Etienne 11/19/2021, 2:35 PM

## 2021-11-19 NOTE — Progress Notes (Addendum)
° °   °  [address]       [place] [address]             [phone]      10 Days Post-Op Procedure(s) (LRB): PACEMAKER IMPLANT (N/A)  Subjective:  No new complaints.  Patient states they have decided on a facility.  He states it begins with an A, but he cant remember the name of it.  Objective: Vital signs in last 24 hours: Temp:  [97.4 F (36.3 C)-99 F (37.2 C)] 99 F (37.2 C) (02/01 0458) Pulse Rate:  [80-96] 96 (02/01 0458) Cardiac Rhythm: Ventricular paced;Other (Comment) (01/31 1951) Resp:  [17-20] 20 (02/01 0458) BP: (93-120)/(52-75) 119/64 (02/01 0458) SpO2:  [93 %-99 %] 94 % (02/01 0458) Weight:  [79.7 kg] 79.7 kg (02/01 0500)    Intake/Output from previous day: 01/31 0701 - 02/01 0700 In: 720 [P.O.:720] Out: 860 [Urine:860]  General appearance: alert, cooperative, and no distress Heart: regular rate and rhythm Lungs: clear to auscultation bilaterally Abdomen: soft, non-tender; bowel sounds normal; no masses,  no organomegaly Extremities: extremities normal, atraumatic, no cyanosis or edema Wound: clean and dry  Lab Results: No results for input(s): WBC, HGB, HCT, PLT in the last 72 hours. BMET: No results for input(s): NA, K, CL, CO2, GLUCOSE, BUN, CREATININE, CALCIUM in the last 72 hours.  PT/INR: No results for input(s): LABPROT, INR in the last 72 hours. ABG    Component Value Date/Time   PHART 7.490 (H) 11/06/2021 0925   HCO3 23.5 11/06/2021 0925   TCO2 24 11/06/2021 0925   ACIDBASEDEF 6.0 (H) 11/05/2021 1032   O2SAT 94.0 11/06/2021 0925   CBG (last 3)  No results for input(s): GLUCAP in the last 72 hours.  Assessment/Plan: S/P Procedure(s) (LRB): PACEMAKER IMPLANT (N/A)  CV- paced, BP stable- continue Lopressor, Lisinopril Pulm- no issues, off oxygen, Deconditioning- dispo planning, patient states they have decided on a facility, can hopefully get patient there today Dispo- patient stable, awaiting final placement  decision, hopefully we can get him discharged today   LOS: 16 days    Ellwood Handler, PA-C' 11/19/2021   Agree with above [place]

## 2021-11-19 NOTE — TOC Progression Note (Signed)
Transition of Care [facility]) - Progression Note    Patient Details  Name: Joshua [name] MRN: [id number] Date of Birth: [birth year]  Transition of Care [facility]) CM/SW Contact  Vinie Sill, [place] Phone Number: 11/19/2021, 2:46 PM  Clinical Narrative:     Called left voice message with patient's son Joshua [name]- informed of updated PT recommendations and requested call back to provide bed offers.   TOC will continue to follow and assist with discharge planning.  Thurmond Butts, MSW, LCSW Clinical Social Worker    Expected Discharge Plan: IP Rehab Facility Barriers to Discharge: Continued Medical Work up  Expected Discharge Plan and Services Expected Discharge Plan: [place] In-house Referral: NA Discharge Planning Services: CM Consult Post Acute Care Choice: IP Rehab Living arrangements for the past 2 months: Single Family Home                 DME Arranged: N/A DME Agency: NA                   Social Determinants of Health (SDOH) Interventions    Readmission Risk Interventions No flowsheet data found.

## 2021-11-19 NOTE — Progress Notes (Signed)
Occupational Therapy Treatment Patient Details Name: Joshua [name] MRN: [id number] DOB: 03/16/1936 Today's Date: 11/19/2021   History of present illness 86 y.o. male with severe 3-vessel heart disease, admitted 11/04/21 for CABG x3 and AVR. Post-op course complicated by acute blood loss anemia requiring mediastinal re-exploration 1/18. ETT 1/17-1/19. Pt with complete heart block s/p temporary pacemaker insertion 1/20. Pt with permanent pacer placed 1/22. Pt developed bil pneumothorax on 1/23 and bil chest tubes placed.  PMH inlcudes CAD, severe AS, HTN, HLD, arthritis, GERD, cataracts.   OT comments  Patient received in bed and agreeable to OT session.  Patient was able to get to EOB with verbal cues and stood from EOB with min guard and verbal cues for sternal precautions.  Patient was able to stand at sink for grooming tasks but requested a seated rest break following. Patient performed mobility in hallway with RW and min guard assist and attempted to use restroom following. Patient making good progress but would benefit from SNF for continued rehab to increase safety and independence with self care and mobility for safe return home.      Recommendations for follow up therapy are one component of a multi-disciplinary discharge planning process, led by the attending physician.  Recommendations may be updated based on patient status, additional functional criteria and insurance authorization.    Follow Up Recommendations  Skilled nursing-short term rehab (<3 hours/day)    Assistance Recommended at Discharge Frequent or constant Supervision/Assistance  Patient can return home with the following  A little help with walking and/or transfers;A little help with bathing/dressing/bathroom;Assist for transportation;Assistance with cooking/housework   Equipment Recommendations  Tub/shower seat    Recommendations for Other Services      Precautions / Restrictions Precautions Precautions:  Sternal;Fall;ICD/Pacemaker Precaution Comments: drainage from L-side chest tube removal site Restrictions Other Position/Activity Restrictions: sternal       Mobility Bed Mobility Overal bed mobility: Modified Independent Bed Mobility: Supine to Sit     Supine to sit: Modified independent (Device/Increase time)     General bed mobility comments: verbal cues for precautions    Transfers Overall transfer level: Needs assistance Equipment used: Rolling walker (2 wheels) Transfers: Sit to/from Stand Sit to Stand: Min guard           General transfer comment: Min Guard A for safety with stand to sit     Balance Overall balance assessment: Modified Independent Sitting-balance support: Feet supported Sitting balance-Leahy Scale: Good Sitting balance - Comments: able to maintain balance sitting on EOB   Standing balance support: Single extremity supported, Bilateral upper extremity supported, During functional activity Standing balance-Leahy Scale: Fair Standing balance comment: able to stand at sink for grooming                           ADL either performed or assessed with clinical judgement   ADL Overall ADL's : Needs assistance/impaired     Grooming: Wash/dry hands;Wash/dry face;Oral care;Supervision/safety;Set up;Standing Grooming Details (indicate cue type and reason): supervision for safety                 Toilet Transfer: Min guard;Ambulation Toilet Transfer Details (indicate cue type and reason): min assist to power up and verbal cues for sternal precautions         Functional mobility during ADLs: Min guard;Rolling walker (2 wheels) General ADL Comments: focused on grooming standing at sink and mobility    Extremity/Trunk Assessment  Vision       Perception     Praxis      Cognition Arousal/Alertness: Awake/alert Behavior During Therapy: WFL for tasks assessed/performed Overall Cognitive Status: No  family/caregiver present to determine baseline cognitive functioning Area of Impairment: Memory                   Current Attention Level: Selective Memory: Decreased short-term memory, Decreased recall of precautions Following Commands: Follows one step commands consistently     Problem Solving: Requires verbal cues, Slow processing General Comments: required frequent cues for WB precautions        Exercises      Shoulder Instructions       General Comments      Pertinent Vitals/ Pain       Pain Assessment Pain Assessment: Faces Faces Pain Scale: Hurts a little bit Pain Location: chest incision Pain Descriptors / Indicators: Operative site guarding Pain Intervention(s): Monitored during session, Repositioned  Home Living                                          Prior Functioning/Environment              Frequency  Min 2X/week        Progress Toward Goals  OT Goals(current goals can now be found in the care plan section)  Progress towards OT goals: Progressing toward goals  Acute Rehab OT Goals Patient Stated Goal: get more rehab OT Goal Formulation: With patient Time For Goal Achievement: 11/24/21 Potential to Achieve Goals: Good ADL Goals Pt Will Perform Grooming: with min guard assist;standing Pt Will Perform Upper Body Bathing: with set-up;sitting Pt Will Perform Lower Body Bathing: with min assist Pt Will Perform Lower Body Dressing: with min assist Pt Will Transfer to Toilet: with min guard assist;ambulating;bedside commode Pt Will Perform Toileting - Clothing Manipulation and hygiene: with min guard assist;sit to/from stand Additional ADL Goal #1: Pt will be S in and OOB for basic ADLs (following sternal and pacemaker precautions)  Plan Discharge plan needs to be updated    Co-evaluation                 AM-PAC OT "6 Clicks" Daily Activity     Outcome Measure   Help from another person eating meals?:  None Help from another person taking care of personal grooming?: A Little Help from another person toileting, which includes using toliet, bedpan, or urinal?: A Lot Help from another person bathing (including washing, rinsing, drying)?: A Lot Help from another person to put on and taking off regular upper body clothing?: A Lot Help from another person to put on and taking off regular lower body clothing?: A Little 6 Click Score: 16    End of Session Equipment Utilized During Treatment: Gait belt;Rolling walker (2 wheels)  OT Visit Diagnosis: Unsteadiness on feet (R26.81);Other abnormalities of gait and mobility (R26.89);Muscle weakness (generalized) (M62.81);Pain   Activity Tolerance Patient tolerated treatment well   Patient Left in chair;with call bell/phone within reach   Nurse Communication Mobility status        Time: 4709-6283 OT Time Calculation (min): 23 min  Charges: OT General Charges $OT Visit: 1 Visit OT Treatments $Self Care/Home Management : 8-22 mins $Therapeutic Activity: 8-22 mins  Lodema Hong, OTA Acute Rehabilitation Services  Pager [phone] Office [place] 11/19/2021, 12:55 PM

## 2021-11-19 NOTE — Progress Notes (Signed)
°  Inpatient Rehabilitation Admissions Coordinator   I have received an denial on the [facility] appeal. I contacted son, [name], by phone an he is aware. They do not want to pursue private pay for CIR due to cost Estimate of care. They would like to get updated SNF bed offers form TOC to pursue. I have alerted acute team and TOC. We will sign off at this time.  Danne Baxter, RN, MSN Rehab Admissions Coordinator [phone] 11/19/2021 11:26 AM

## 2021-11-20 ENCOUNTER — Ambulatory Visit: Payer: Medicare HMO

## 2021-11-20 MED ORDER — ATORVASTATIN CALCIUM 40 MG PO TABS: 40.0000 mg | ORAL_TABLET | Freq: Every day | ORAL | Status: DC

## 2021-11-20 MED ORDER — TRAMADOL HCL 50 MG PO TABS: 50.0000 mg | ORAL_TABLET | Freq: Four times a day (QID) | ORAL | 0 refills | Status: DC | PRN

## 2021-11-20 MED ORDER — LISINOPRIL 10 MG PO TABS: 10.0000 mg | ORAL_TABLET | Freq: Every day | ORAL | Status: DC

## 2021-11-20 MED ORDER — ACETAMINOPHEN 325 MG PO TABS: 325.0000 mg | ORAL_TABLET | ORAL | Status: DC | PRN

## 2021-11-20 MED ORDER — METOPROLOL TARTRATE 25 MG PO TABS: 25.0000 mg | ORAL_TABLET | Freq: Two times a day (BID) | ORAL | Status: DC

## 2021-11-20 NOTE — TOC Progression Note (Signed)
Transition of Care [facility]) - Progression Note    Patient Details  Name: MICHOEL KUNIN MRN: [id number] Date of Birth: 12-17-1935  Transition of Care [facility]) CM/SW [place], [place] Phone Number: 11/20/2021, 1:25 PM  Clinical Narrative:     Family has selected [facility] confirmed availability and will start authorization  Thurmond Butts, MSW, LCSW Clinical Social Worker    Expected Discharge Plan: Skilled Nursing Facility Barriers to Discharge: Continued Medical Work up  Expected Discharge Plan and Services Expected Discharge Plan: [place] In-house Referral: Clinical Social Work Discharge Planning Services: [employer] Consult Post Acute Care Choice: [place], IP Rehab Living arrangements for the past 2 months: Single Family Home                 DME Arranged: N/A                     Social Determinants of Health (SDOH) Interventions    Readmission Risk Interventions No flowsheet data found.

## 2021-11-20 NOTE — Progress Notes (Signed)
CARDIAC REHAB PHASE I   PRE:  Rate/Rhythm: 80 pacing    BP: sitting 104/65    SaO2: 93 RA  MODE:  Ambulation: 260 ft   POST:  Rate/Rhythm: 96 pacing    BP: sitting 115/69     SaO2: 95 RA  Pt continues to struggle to get to the EOB. Verbal cues with prolonged effort. First attempt to stand failed as his hips were not close enough to the EOB. Moved forward more then stood with max assist with gait belt. Walked with RW, slow and steady. No c/o but had to tell pt a point in hall to get to to increase distance. To recliner, VSS. [place], ACSM 11/20/2021 12:01 PM

## 2021-11-20 NOTE — Progress Notes (Signed)
Mobility Specialist: Progress Note   11/20/21 1827  Mobility  Activity  (STS x2)  Level of Assistance Moderate assist, patient does 50-74%  Assistive Device Four wheel walker  Activity Response Tolerated well  $Mobility charge 1 Mobility   Assisted pt with STS x2 with modA to stand. Pt c/o pain in his L knee so focused on offloading weight onto RLE during session, tolerated well. Pt assisted back to bed and requesting heating pack for L knee. Call bell and phone at his side.   [facility] Kemp Gomes Mobility Specialist Mobility Specialist 4 [place]: [phone] Mobility Specialist 2 [place] and [place]: [phone]

## 2021-11-20 NOTE — Progress Notes (Signed)
° °   °  [address]       [place], [address]             [phone]      11 Days Post-Op Procedure(s) (LRB): PACEMAKER IMPLANT (N/A)  Subjective:  No new complaints.  Awaiting SNF placement.  Objective: Vital signs in last 24 hours: Temp:  [97.9 F (36.6 C)-99.3 F (37.4 C)] 97.9 F (36.6 C) (02/02 0420) Pulse Rate:  [81-86] 81 (02/02 0420) Cardiac Rhythm: Ventricular paced (02/01 2023) Resp:  [17-20] 17 (02/02 0515) BP: (96-120)/(52-87) 120/77 (02/02 0420) SpO2:  [94 %-100 %] 94 % (02/02 0420) Weight:  [79.2 kg] 79.2 kg (02/02 0515)  Intake/Output from previous day: 02/01 0701 - 02/02 0700 In: 180 [P.O.:180] Out: 1205 [Urine:1205]  General appearance: alert, cooperative, and no distress Heart: regular rate and rhythm Lungs: clear to auscultation bilaterally Abdomen: soft, non-tender; bowel sounds normal; no masses,  no organomegaly Extremities: no edema, ecchymosis resolving Wound: clean and dry  Lab Results: No results for input(s): WBC, HGB, HCT, PLT in the last 72 hours. BMET: No results for input(s): NA, K, CL, CO2, GLUCOSE, BUN, CREATININE, CALCIUM in the last 72 hours.  PT/INR: No results for input(s): LABPROT, INR in the last 72 hours. ABG    Component Value Date/Time   PHART 7.490 (H) 11/06/2021 0925   HCO3 23.5 11/06/2021 0925   TCO2 24 11/06/2021 0925   ACIDBASEDEF 6.0 (H) 11/05/2021 1032   O2SAT 94.0 11/06/2021 0925   CBG (last 3)  No results for input(s): GLUCAP in the last 72 hours.  Assessment/Plan: S/P Procedure(s) (LRB): PACEMAKER IMPLANT (N/A)  CV- hemodynamically stable, paced- on Lopressor, Lisinopril Pulm- no acute issues, continue IS Deconditioning-awaiting SNF placement.. hopefully will get authorization today Dispo- patient stable, awaiting SNF placement, hopefully get auth for discharge today   LOS: 17 days   Ellwood Handler, PA-C 11/20/2021

## 2021-11-20 NOTE — TOC Progression Note (Signed)
Transition of Care [facility]) - Progression Note    Patient Details  Name: LEJUAN BOTTO MRN: [id number] Date of Birth: 1936-09-07  Transition of Care [facility]) CM/SW [place], [place] Phone Number: 11/20/2021, 8:49 AM  Clinical Narrative:     Patient's son, [name] choice [facility] or [place] contacted [place] - SNF confirmed availability -advised SNF they will need to start insurance authorization    Expected Discharge Plan: [place] Barriers to Discharge: Continued Medical Work up  Expected Discharge Plan and Services Expected Discharge Plan: [place] In-house Referral: Clinical Social Work Discharge Planning Services: [employer] Consult Post Acute Care Choice: [place], IP Rehab Living arrangements for the past 2 months: Single Family Home                 DME Arranged: N/A                     Social Determinants of Health (SDOH) Interventions    Readmission Risk Interventions No flowsheet data found.

## 2021-11-20 NOTE — Progress Notes (Signed)
°  Mobility Specialist: Progress Note   11/20/21 1250  Mobility  Activity Ambulated with assistance in hallway  Level of Assistance Moderate assist, patient does 50-74%  Assistive Device Four wheel walker  Distance Ambulated (ft) 290 ft  Activity Response Tolerated well  $Mobility charge 1 Mobility   Pre-Mobility: 86 HR Post-Mobility: 80 HR, 116/68 BP, 99% SpO2  Pt required modA to stand from recliner, x2 attempts to stand. C/o some SOB on the way back to the room, otherwise asymptomatic. Pt back to bed after walk per request with call bell and phone in reach. Will f/u later to work on STS.   [facility] Priyansh Pry Mobility Specialist Mobility Specialist 4 [place]: [phone] Mobility Specialist 2 [place] and [place]: [phone]

## 2021-11-21 DIAGNOSIS — K219 Gastro-esophageal reflux disease without esophagitis: Secondary | ICD-10-CM | POA: Diagnosis not present

## 2021-11-21 DIAGNOSIS — M6281 Muscle weakness (generalized): Secondary | ICD-10-CM | POA: Diagnosis not present

## 2021-11-21 DIAGNOSIS — I5043 Acute on chronic combined systolic (congestive) and diastolic (congestive) heart failure: Secondary | ICD-10-CM | POA: Diagnosis not present

## 2021-11-21 DIAGNOSIS — Z7401 Bed confinement status: Secondary | ICD-10-CM | POA: Diagnosis not present

## 2021-11-21 DIAGNOSIS — Z953 Presence of xenogenic heart valve: Secondary | ICD-10-CM | POA: Diagnosis not present

## 2021-11-21 DIAGNOSIS — I493 Ventricular premature depolarization: Secondary | ICD-10-CM | POA: Diagnosis not present

## 2021-11-21 DIAGNOSIS — E785 Hyperlipidemia, unspecified: Secondary | ICD-10-CM | POA: Diagnosis not present

## 2021-11-21 DIAGNOSIS — I2510 Atherosclerotic heart disease of native coronary artery without angina pectoris: Secondary | ICD-10-CM | POA: Diagnosis not present

## 2021-11-21 DIAGNOSIS — Z951 Presence of aortocoronary bypass graft: Secondary | ICD-10-CM | POA: Diagnosis not present

## 2021-11-21 DIAGNOSIS — R262 Difficulty in walking, not elsewhere classified: Secondary | ICD-10-CM | POA: Diagnosis not present

## 2021-11-21 DIAGNOSIS — R531 Weakness: Secondary | ICD-10-CM | POA: Diagnosis not present

## 2021-11-21 DIAGNOSIS — I442 Atrioventricular block, complete: Secondary | ICD-10-CM | POA: Diagnosis not present

## 2021-11-21 DIAGNOSIS — I455 Other specified heart block: Secondary | ICD-10-CM | POA: Diagnosis not present

## 2021-11-21 DIAGNOSIS — R2689 Other abnormalities of gait and mobility: Secondary | ICD-10-CM | POA: Diagnosis not present

## 2021-11-21 DIAGNOSIS — Z79899 Other long term (current) drug therapy: Secondary | ICD-10-CM | POA: Diagnosis not present

## 2021-11-21 DIAGNOSIS — D696 Thrombocytopenia, unspecified: Secondary | ICD-10-CM | POA: Diagnosis not present

## 2021-11-21 DIAGNOSIS — Z950 Presence of cardiac pacemaker: Secondary | ICD-10-CM | POA: Diagnosis not present

## 2021-11-21 DIAGNOSIS — E782 Mixed hyperlipidemia: Secondary | ICD-10-CM | POA: Diagnosis not present

## 2021-11-21 DIAGNOSIS — I9589 Other hypotension: Secondary | ICD-10-CM | POA: Diagnosis not present

## 2021-11-21 DIAGNOSIS — I10 Essential (primary) hypertension: Secondary | ICD-10-CM | POA: Diagnosis not present

## 2021-11-21 DIAGNOSIS — R1311 Dysphagia, oral phase: Secondary | ICD-10-CM | POA: Diagnosis not present

## 2021-11-21 DIAGNOSIS — I350 Nonrheumatic aortic (valve) stenosis: Secondary | ICD-10-CM | POA: Diagnosis not present

## 2021-11-21 DIAGNOSIS — R0989 Other specified symptoms and signs involving the circulatory and respiratory systems: Secondary | ICD-10-CM | POA: Diagnosis not present

## 2021-11-21 DIAGNOSIS — D62 Acute posthemorrhagic anemia: Secondary | ICD-10-CM | POA: Diagnosis not present

## 2021-11-21 DIAGNOSIS — F4321 Adjustment disorder with depressed mood: Secondary | ICD-10-CM | POA: Diagnosis not present

## 2021-11-21 LAB — RESP PANEL BY RT-PCR (FLU A&B, COVID) ARPGX2
Influenza A by PCR: NEGATIVE
Influenza B by PCR: NEGATIVE
SARS Coronavirus 2 by RT PCR: NEGATIVE

## 2021-11-21 NOTE — Progress Notes (Signed)
Occupational Therapy Treatment Patient Details Name: Joshua [name] MRN: [id number] DOB: Nov 23, 1935 Today's Date: 11/21/2021   History of present illness 86 y.o. male with severe 3-vessel heart disease, admitted 11/04/21 for CABG x3 and AVR. Post-op course complicated by acute blood loss anemia requiring mediastinal re-exploration 1/18. ETT 1/17-1/19. Pt with complete heart block s/p temporary pacemaker insertion 1/20. Pt with permanent pacer placed 1/22. Pt developed bil pneumothorax on 1/23 and bil chest tubes placed.  PMH inlcudes CAD, severe AS, HTN, HLD, arthritis, GERD, cataracts.   OT comments  Patient received in bed and eager to get out of bed. Patient performed bathing feet and changing socks seated on EOB with supervision and setup. Patient stood at sink for grooming and bathing tasks but did not want to change clothing. Patient performed functional mobility with RW before retiring to recliner. Patient continues to make good gains with therapy but continues to have difficulty with sit to stands due to sternal precautions. Patient appropriate for SNF discharge placement.    Recommendations for follow up therapy are one component of a multi-disciplinary discharge planning process, led by the attending physician.  Recommendations may be updated based on patient status, additional functional criteria and insurance authorization.    Follow Up Recommendations  Skilled nursing-short term rehab (<3 hours/day)    Assistance Recommended at Discharge Frequent or constant Supervision/Assistance  Patient can return home with the following  A little help with walking and/or transfers;A little help with bathing/dressing/bathroom;Assist for transportation;Assistance with cooking/housework   Equipment Recommendations  Tub/shower seat    Recommendations for Other Services      Precautions / Restrictions Precautions Precautions: Sternal;Fall;ICD/Pacemaker Precaution Comments: drainage from L-side  chest tube removal site Restrictions Other Position/Activity Restrictions: sternal       Mobility Bed Mobility Overal bed mobility: Modified Independent             General bed mobility comments: verbal cues for precautions    Transfers Overall transfer level: Needs assistance Equipment used: Rolling walker (2 wheels) Transfers: Sit to/from Stand Sit to Stand: Mod assist           General transfer comment: min guard to stand from EOB with bed raised and mod assist to stand from regular chair height     Balance Overall balance assessment: Needs assistance Sitting-balance support: Feet supported Sitting balance-Leahy Scale: Good Sitting balance - Comments: able to perform foot care seated on EOB   Standing balance support: Single extremity supported, During functional activity Standing balance-Leahy Scale: Poor Standing balance comment: reliant on at least single UE support                           ADL either performed or assessed with clinical judgement   ADL Overall ADL's : Needs assistance/impaired     Grooming: Wash/dry hands;Wash/dry face;Oral care;Supervision/safety;Set up;Standing;Brushing hair Grooming Details (indicate cue type and reason): performed standing at sink Upper Body Bathing: Min guard;Standing Upper Body Bathing Details (indicate cue type and reason): performed standing at sink Lower Body Bathing: Minimal assistance;Sit to/from stand Lower Body Bathing Details (indicate cue type and reason): able to bathe feet seated and peri area standing with min assist     Lower Body Dressing: Supervision/safety;Sitting/lateral leans Lower Body Dressing Details (indicate cue type and reason): donned socks seated on EOB with cues for compensatory techniques             Functional mobility during ADLs: Min guard;Rolling walker (2 wheels)  General ADL Comments: performed bathing and changed socks, did not want to change shirt or shorts     Extremity/Trunk Assessment              Vision       Perception     Praxis      Cognition Arousal/Alertness: Awake/alert Behavior During Therapy: WFL for tasks assessed/performed Overall Cognitive Status: No family/caregiver present to determine baseline cognitive functioning Area of Impairment: Memory                   Current Attention Level: Selective Memory: Decreased short-term memory, Decreased recall of precautions Following Commands: Follows one step commands consistently     Problem Solving: Requires verbal cues, Slow processing General Comments: requires continued cues for sternal precautions        Exercises      Shoulder Instructions       General Comments      Pertinent Vitals/ Pain       Pain Assessment Pain Assessment: Faces Faces Pain Scale: Hurts a little bit Pain Location: chest incision Pain Descriptors / Indicators: Operative site guarding Pain Intervention(s): Monitored during session  Home Living                                          Prior Functioning/Environment              Frequency  Min 2X/week        Progress Toward Goals  OT Goals(current goals can now be found in the care plan section)  Progress towards OT goals: Progressing toward goals  Acute Rehab OT Goals Patient Stated Goal: get more rehab OT Goal Formulation: With patient Time For Goal Achievement: 11/24/21 Potential to Achieve Goals: Good ADL Goals Pt Will Perform Grooming: with min guard assist;standing Pt Will Perform Upper Body Bathing: with set-up;sitting Pt Will Perform Lower Body Bathing: with min assist Pt Will Perform Lower Body Dressing: with min assist Pt Will Transfer to Toilet: with min guard assist;ambulating;bedside commode Pt Will Perform Toileting - Clothing Manipulation and hygiene: with min guard assist;sit to/from stand Additional ADL Goal #1: Pt will be S in and OOB for basic ADLs (following sternal  and pacemaker precautions)  Plan Discharge plan needs to be updated    Co-evaluation                 AM-PAC OT "6 Clicks" Daily Activity     Outcome Measure   Help from another person eating meals?: None Help from another person taking care of personal grooming?: A Little Help from another person toileting, which includes using toliet, bedpan, or urinal?: A Little Help from another person bathing (including washing, rinsing, drying)?: A Little Help from another person to put on and taking off regular upper body clothing?: A Little Help from another person to put on and taking off regular lower body clothing?: A Little 6 Click Score: 19    End of Session Equipment Utilized During Treatment: Gait belt;Rolling walker (2 wheels)  OT Visit Diagnosis: Unsteadiness on feet (R26.81);Other abnormalities of gait and mobility (R26.89);Muscle weakness (generalized) (M62.81);Pain   Activity Tolerance Patient tolerated treatment well   Patient Left in chair;with call bell/phone within reach   Nurse Communication Mobility status        Time: 6010-9323 OT Time Calculation (min): 24 min  Charges: OT General Charges $OT Visit: 1  Visit OT Treatments $Self Care/Home Management : 23-37 mins  Lodema Hong, [place]  Pager [phone] Office [phone]   Trixie Dredge 11/21/2021, 8:51 AM

## 2021-11-21 NOTE — TOC Transition Note (Signed)
Transition of Care [facility]) - CM/SW Discharge Note   Patient Details  Name: Joshua [name] MRN: [id number] Date of Birth: May 31, 1936  Transition of Care [facility]) CM/SW Contact:  Vinie Sill, LCSW Phone Number: 11/21/2021, 12:20 PM   Clinical Narrative:     Patient will Discharge to: [facility] Discharge Date: 11/21/2021 Family Notified: [name] Transport By: Corey Harold  Per MD patient is ready for discharge. RN, patient, and facility notified of discharge. Discharge Summary sent to facility. RN given number for [phone], Room 113-B. Ambulance transport requested for patient.   Clinical Social Worker signing off.  Thurmond Butts, MSW, LCSW Clinical Social Worker     Final next level of care: Skilled Nursing Facility Barriers to Discharge: Barriers Resolved   Patient Goals and CMS Choice     Choice offered to / list presented to : Adult Children, Patient  Discharge Placement              Patient chooses bed at: [facility] Patient to be transferred to facility by: [facility] Name of family member notified: son, [name] Patient and family notified of of transfer: 11/21/21  Discharge Plan and Services In-house Referral: Clinical Social Work Discharge Planning Services: [employer] Consult Post Acute Care Choice: [facility]          DME Arranged: N/A                    Social Determinants of Health (SDOH) Interventions     Readmission Risk Interventions No flowsheet data found.

## 2021-11-21 NOTE — Care Management Important Message (Signed)
Important Message  Patient Details  Name: OLMAN YONO MRN: [id number] Date of Birth: May 18, 1936   Medicare Important Message Given:  Yes     Shelda Altes 11/21/2021, 9:02 AM

## 2021-11-21 NOTE — Progress Notes (Signed)
Mobility Specialist: Progress Note   11/21/21 1035  Mobility  Activity Ambulated with assistance in hallway  Level of Assistance Minimal assist, patient does 75% or more  Assistive Device Four wheel walker  Distance Ambulated (ft) 220 ft  Activity Response Tolerated well  $Mobility charge 1 Mobility   Pre-Mobility: 87 HR Post-Mobility: 104 HR  Pt required minA to stand today with c/o L knee pain, no rating given. No c/o throughout ambulation. To BR after walk and instructed to pull call string when finished.   [facility] Julien Berryman Mobility Specialist Mobility Specialist 4 [place]: [phone] Mobility Specialist 2 [place]: [phone]

## 2021-11-21 NOTE — Progress Notes (Signed)
° °   °  [address]       [place] [address]             [phone]        12 Days Post-Op Procedure(s) (LRB): PACEMAKER IMPLANT (N/A)  Subjective: Patient already walked this morning. He has no complaints.  Objective: Vital signs in last 24 hours: Temp:  [97.4 F (36.3 C)-99.4 F (37.4 C)] 98.8 F (37.1 C) (02/03 0423) Pulse Rate:  [64-86] 84 (02/03 0423) Cardiac Rhythm: Ventricular paced (02/02 1945) Resp:  [17-20] 17 (02/03 0423) BP: (108-125)/(63-82) 125/82 (02/03 0423) SpO2:  [90 %-99 %] 90 % (02/03 0423) Weight:  [72.5 kg] 72.5 kg (02/03 0500)  Pre op weight  82.3 kg Current Weight  11/21/21 72.5 kg      Intake/Output from previous day: 02/02 0701 - 02/03 0700 In: 620 [P.O.:620] Out: 1750 [Urine:1750]   Physical Exam:  Cardiovascular: V paced Pulmonary: Clear to auscultation bilaterally Abdomen: Soft, non tender, bowel sounds present. Extremities: No  lower extremity edema. Wounds: Clean and dry.  No erythema or signs of infection.  Lab Results: CBC:No results for input(s): WBC, HGB, HCT, PLT in the last 72 hours. BMET: No results for input(s): NA, K, CL, CO2, GLUCOSE, BUN, CREATININE, CALCIUM in the last 72 hours.  PT/INR:  Lab Results  Component Value Date   INR 1.3 (H) 11/05/2021   INR 1.5 (H) 11/05/2021   INR 2.0 (H) 11/05/2021   ABG:  INR: Will add last result for INR, ABG once components are confirmed Will add last 4 CBG results once components are confirmed  Assessment/Plan:  1. CV - V paced. On Lopressor 25 mg bid and Lisinopril 10 mg daily. 2.  Pulmonary - On room air. Encourage incentive spirometer. 3.  Expected post op acute blood loss anemia - Last H and H 9.8 and 29.6 4. Remove chest tube sutures 5. Deconditioned-Awaiting authorization for SNF  Nakiya Rallis M ZimmermanPA-C 11/21/2021,8:11 AM

## 2021-11-24 ENCOUNTER — Encounter: Payer: Self-pay | Admitting: Adult Health

## 2021-11-24 ENCOUNTER — Non-Acute Institutional Stay (SKILLED_NURSING_FACILITY): Payer: Medicare HMO | Admitting: Adult Health

## 2021-11-24 ENCOUNTER — Telehealth (HOSPITAL_COMMUNITY): Payer: Self-pay

## 2021-11-24 DIAGNOSIS — I350 Nonrheumatic aortic (valve) stenosis: Secondary | ICD-10-CM

## 2021-11-24 DIAGNOSIS — Z950 Presence of cardiac pacemaker: Secondary | ICD-10-CM

## 2021-11-24 DIAGNOSIS — K219 Gastro-esophageal reflux disease without esophagitis: Secondary | ICD-10-CM

## 2021-11-24 DIAGNOSIS — I442 Atrioventricular block, complete: Secondary | ICD-10-CM

## 2021-11-24 DIAGNOSIS — Z951 Presence of aortocoronary bypass graft: Secondary | ICD-10-CM

## 2021-11-24 DIAGNOSIS — I10 Essential (primary) hypertension: Secondary | ICD-10-CM

## 2021-11-24 DIAGNOSIS — D62 Acute posthemorrhagic anemia: Secondary | ICD-10-CM | POA: Diagnosis not present

## 2021-11-24 DIAGNOSIS — Z953 Presence of xenogenic heart valve: Secondary | ICD-10-CM

## 2021-11-24 DIAGNOSIS — D696 Thrombocytopenia, unspecified: Secondary | ICD-10-CM

## 2021-11-24 DIAGNOSIS — F5101 Primary insomnia: Secondary | ICD-10-CM

## 2021-11-24 NOTE — Progress Notes (Signed)
Cardiology Clinic Note   Patient Name: Joshua [name] Date of Encounter: 11/27/2021  Primary Care Provider:  Shelda Pal, DO Primary Cardiologist:  Sanda Klein, MD  Patient Profile    Joshua [name] 86 year old male presents for follow-up of his essential hypertension PVCs and complete heart block.  Past Medical History    Past Medical History:  Diagnosis Date   Arthritis    Cataract    Esophageal stricture    GERD (gastroesophageal reflux disease)    Hyperlipidemia    Hypertension    Liver disease    hepatitis   Severe aortic stenosis    Past Surgical History:  Procedure Laterality Date   AORTIC VALVE REPLACEMENT N/A 11/04/2021   Procedure: AORTIC VALVE REPLACEMENT (AVR) USING 23 MM INSPIRIS RESILIA  AORTIC VALVE;  Surgeon: Lajuana Matte, MD;  Location: [place];  Service: Open Heart Surgery;  Laterality: N/A;   cataract  2013   CORONARY ARTERY BYPASS GRAFT N/A 11/04/2021   Procedure: CORONARY ARTERY BYPASS GRAFTING (CABG) X 3, USING LEFT INTERNAL MAMMARY ARTERY AND ENDOSCOPICALLY HARVESTED RIGHT GREATER SAPHENOUS VEIN. LIMA TO LAD, SVG TO PDA, SVG TO OM;  Surgeon: Lajuana Matte, MD;  Location: [place];  Service: Open Heart Surgery;  Laterality: N/A;   ENDOVEIN HARVEST OF GREATER SAPHENOUS VEIN Right 11/04/2021   Procedure: ENDOVEIN HARVEST OF GREATER SAPHENOUS VEIN;  Surgeon: Lajuana Matte, MD;  Location: [place];  Service: Open Heart Surgery;  Laterality: Right;   EXPLORATION POST OPERATIVE OPEN HEART N/A 11/05/2021   Procedure: EXPLORATION POST OPERATIVE OPEN HEARTFOR BLEEDING;  Surgeon: Melrose Nakayama, MD;  Location: [place];  Service: Open Heart Surgery;  Laterality: N/A;   HEMORROIDECTOMY  2001   HERNIA REPAIR  2005   INTRAVASCULAR PRESSURE WIRE/FFR STUDY N/A 11/03/2021   Procedure: INTRAVASCULAR PRESSURE WIRE/FFR STUDY;  Surgeon: Burnell Blanks, MD;  Location: [place] [facility];  Service: Cardiovascular;  Laterality: N/A;    PACEMAKER IMPLANT N/A 11/09/2021   Procedure: PACEMAKER IMPLANT;  Surgeon: Vickie Epley, MD;  Location: [place] [facility];  Service: Cardiovascular;  Laterality: N/A;   RIGHT/LEFT HEART CATH AND CORONARY ANGIOGRAPHY N/A 11/03/2021   Procedure: RIGHT/LEFT HEART CATH AND CORONARY ANGIOGRAPHY;  Surgeon: Burnell Blanks, MD;  Location: [place] [facility];  Service: Cardiovascular;  Laterality: N/A;   TEE WITHOUT CARDIOVERSION N/A 11/04/2021   Procedure: TRANSESOPHAGEAL ECHOCARDIOGRAM (TEE);  Surgeon: Lajuana Matte, MD;  Location: [place];  Service: Open Heart Surgery;  Laterality: N/A;   TEMPORARY PACEMAKER N/A 11/07/2021   Procedure: TEMPORARY PACEMAKER;  Surgeon: [name], MD;  Location: [place] [facility];  Service: Cardiovascular;  Laterality: N/A;   TONSILLECTOMY  1948    Allergies  No Known Allergies  History of Present Illness    Joshua [name] has a PMH of essential hypertension, moderate calcified aortic stenosis, PVCs, severe aortic stenosis s/p AVR on (11723), complete heart block, GERD, DJD, status post CABG x3 LIMA-LAD, SVG-PDA, SVG-OM (11/04/2021), status post dual-chamber pacemaker 11/09/2021.  On 11/05/2020 he had exploration of postoperative open heart for bleeding.  He was referred to Dr. Audie Box for evaluation after he had a syncopal episode.  He had been walking in his neighborhood when he felt lightheaded and passed out.  He did not seizure-like activity and did not experience loss of bladder control.  His aortic stenosis had been originally diagnosed in 2017.  He has not been followed up on since that time.  Patient reported that he did not know follow-up was required.  He also reported lower extremity swelling which has been present for several months.  On exam Dr. Davina Poke heard a very loud murmur with an absent S2 and he was concerned that he had progression of aortic stenosis.  An echocardiogram showed preserved LVEF 60-65% with severe aortic stenosis and  a mean gradient of 50 mmHg with a peak gradient of 95.6 mmHg, AVA 0.79 cm.  His aortic valve leaflets were also calcified and thickened.  He was referred to Dr. Angelena Form for possible TAVR.  It was felt that due to his recent syncopal episode he should be expedited for TAVR.  He underwent cardiac catheterization 11/03/2021 which showed three-vessel coronary artery disease.  It was felt that he should undergo coronary bypass grafting and conventional aortic valve replacement.  A consult to cardiothoracic surgery was made.  He underwent CABG x3 on 11/04/2020.  At that time he also underwent AVR with bioprosthetic valve.  He tolerated the procedure well and was transferred to SICU.  He was noted to have high chest tube output and received 2 units of packed red blood cells.  His lab work showed evidence of a coagulopathy and he was treated with 4 FFP and 1 platelet.  His bleeding slowed.  His pacemaker leads were capturing intermittently.  He presented back to the operating room for reexploration.  He received an additional 3 units of packed red blood cells, 3 units of FFP, and 2 platelets.  He was extubated on postop day 2.  His hemoglobin remained stable.  He underwent placement of temporary transvenous pacemaker on 11/07/2021.  Patient received placement of PPM 11/09/2021.  His epicardial pacing wires were removed without difficulty.  His chest tube output decreased and chest tubes were removed on 11/08/2021.  His chest x-ray on 123 showed bilateral pneumothoraces.  A chest x-ray showed minimal apical pneumothorax on the left and he was transferred to the progressive care unit on 11/13/2021.  His blood pressure was low and his lisinopril was decreased to 10 mg daily.  Due to his deconditioning recommendation for skilled nursing facility placement was arranged.  His surgical incisions continue to heal well without evidence of infection.  He was transferred to skilled nursing facility in stable condition 11/21/2021.  He was  admitted 11/03/2021 and discharged in stable condition on 11/21/2021.  He is 100% V paced, mostly a sensed and V paced.   He presents to the clinic today for follow-up evaluation states he feels well.  He continues at rehab, [facility], and is doing 1-2 sessions 6 days/week of rehab.  He is tolerating well.  He expects to return to home soon and continue living definitely.  His EKG shows V paced rhythm at 98 bpm.  We reviewed his open heart surgery and aortic valve replacement as well as his PPM.  He and his son expressed understanding.  We will continue current medication regimen, have him increase his physical activity as tolerated, continue heart healthy low-sodium diet and plan follow-up in 3 months.  Today he denies chest pain, shortness of breath, lower extremity edema, fatigue, palpitations, melena, hematuria, hemoptysis, diaphoresis, weakness, presyncope, syncope, orthopnea, and PND.   Home Medications    Prior to Admission medications   Medication Sig Start Date End Date Taking? Authorizing Provider  acetaminophen (TYLENOL) 325 MG tablet Take 1-2 tablets (325-650 mg total) by mouth every 4 (four) hours as needed for mild pain. 11/20/21   Barrett, Lodema Hong, PA-C  aspirin EC 81 MG tablet Take 1 tablet (81 mg total) by mouth daily. Swallow whole. 10/29/21   Burnell Blanks, MD  atorvastatin (LIPITOR) 40 MG tablet Take 1 tablet (40 mg total) by mouth daily. 11/20/21   Barrett, Lodema Hong, PA-C  Glucosamine-Chondroit-Vit C-Mn (GLUCOSAMINE 1500 COMPLEX PO) Take 2 tablets by mouth every other day.    [provider]  ketoconazole (NIZORAL) 2 % shampoo Apply 1 application topically once a week. 04/18/18   [provider]  lisinopril (ZESTRIL) 10 MG tablet Take 1 tablet (10 mg total) by mouth daily. 11/20/21   Barrett, Erin R, PA-C  metoprolol tartrate (LOPRESSOR) 25 MG tablet Take 1 tablet (25 mg total) by mouth 2 (two) times daily. 11/20/21   Barrett, Erin R, PA-C  Multiple Vitamin  (MULTIVITAMIN) tablet Take 1 tablet by mouth daily.    [provider]  omeprazole (PRILOSEC) 40 MG capsule TAKE 1 CAPSULE EVERY DAY 05/26/21   Wendling, Crosby Oyster, DO  traMADol (ULTRAM) 50 MG tablet Take 1 tablet (50 mg total) by mouth every 6 (six) hours as needed for moderate pain. 11/20/21   Barrett, Erin R, PA-C  vitamin B-12 (CYANOCOBALAMIN) 1000 MCG tablet Take 1,000 mcg by mouth daily.    [provider]    Family History    Family History  Problem Relation Age of Onset   Heart failure Mother 51   Heart failure Father 79       Scarlet fever as child-Valve disease   Cancer Maternal Grandmother    Diabetes Child    Heart Problems Child 45       cardiac arrest   He indicated that his mother is deceased. He indicated that his father is deceased. He indicated that both of his brothers are deceased. He indicated that his maternal grandmother is deceased. He indicated that his paternal grandmother is deceased. He indicated that his paternal grandfather is deceased. He indicated that two of his three children are alive.  Social History    Social History   Socioeconomic History   Marital status: Widowed    Spouse name: Not on file   Number of children: 3   Years of education: Not on file   Highest education level: Not on file  Occupational History   Occupation: retired Estate agent for [employer]   Occupation: Radiographer, 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  Tobacco Use   Smoking status: Former    Types: Cigarettes    Quit date: 1965    Years since quitting: 58.1   Smokeless tobacco: Never  Substance and Sexual Activity   Alcohol use: Yes    Alcohol/week: 21.0 standard drinks    Types: 21 Shots of liquor per week    Comment: daily a few cocktails   Drug use: No   Sexual activity: Never  Other Topics Concern   Not on file  Social History Narrative   Epworth Sleepiness Scale = 3 (as of 07/15/2016)   Social Determinants of Health   Financial Resource Strain: Low Risk     Difficulty of Paying Living Expenses: Not hard at all  Food Insecurity: No Food Insecurity   Worried About Charity fundraiser in the Last Year: Never true   Ran Out of Food in the Last Year: Never true  Transportation Needs: No Transportation Needs   Lack of Transportation (Medical): No   Lack of Transportation (Non-Medical): No  Physical Activity: Insufficiently Active   Days of Exercise per Week: 3 days  Minutes of Exercise per Session: 30 min  Stress: No Stress Concern Present   Feeling of Stress : Not at all  Social Connections: Moderately Integrated   Frequency of Communication with Friends and Family: Once a week   Frequency of Social Gatherings with Friends and Family: Three times a week   Attends Religious Services: More than 4 times per year   Active Member of Clubs or Organizations: Yes   Attends Archivist Meetings: 1 to 4 times per year   Marital Status: Widowed  Human resources officer Violence: Not At Risk   Fear of Current or Ex-Partner: No   Emotionally Abused: No   Physically Abused: No   Sexually Abused: No     Review of Systems    General:  No chills, fever, night sweats or weight changes.  Cardiovascular:  No chest pain, dyspnea on exertion, edema, orthopnea, palpitations, paroxysmal nocturnal dyspnea. Dermatological: No rash, lesions/masses Respiratory: No cough, dyspnea Urologic: No hematuria, dysuria Abdominal:   No nausea, vomiting, diarrhea, bright red blood per rectum, melena, or hematemesis Neurologic:  No visual changes, wkns, changes in mental status. All other systems reviewed and are otherwise negative except as noted above.  Physical Exam    VS:  BP 101/62 (BP Location: Left Arm, Patient Position: Sitting, Cuff Size: Normal)    Pulse 100    Ht 5\' 11"  (1.803 m)    Wt 170 lb (77.1 kg)    SpO2 95%    BMI 23.71 kg/m  , BMI Body mass index is 23.71 kg/m. GEN: Well nourished, well developed, in no acute distress. HEENT: normal. Neck: Supple,  no JVD, carotid bruits, or masses. Cardiac: RRR, no murmurs, rubs, or gallops. No clubbing, cyanosis, edema.  Radials/DP/PT 2+ and equal bilaterally.  Respiratory:  Respirations regular and unlabored, clear to auscultation bilaterally. GI: Soft, nontender, nondistended, BS + x 4. MS: no deformity or atrophy. Skin: warm and dry, no rash.  Sternal incision well approximated, clean dry intact no signs of infection.  Pacemaker pocket clean dry intact no signs of infection. Neuro:  Strength and sensation are intact. Psych: Normal affect.  Accessory Clinical Findings    Recent Labs: 09/25/2021: BNP 33.0; TSH 1.150 11/03/2021: ALT 24 11/12/2021: Magnesium 1.6 11/13/2021: BUN 16; Creatinine, Ser 1.03; Hemoglobin 9.8; Platelets 181; Potassium 4.3; Sodium 132   Recent Lipid Panel    Component Value Date/Time   CHOL 195 10/24/2021 1531   TRIG 76 10/24/2021 1531   HDL 101 10/24/2021 1531   CHOLHDL 1.9 10/24/2021 1531   VLDL 21.4 04/09/2021 1033   LDLCALC 78 10/24/2021 1531    ECG personally reviewed by me today-V-paced rhythm 98 bpm- No acute changes  Echocardiogram 10/08/2021  IMPRESSIONS     1. The aortic valve was not well visualized. Aortic valve regurgitation  is not visualized. Severe aortic valve stenosis. Aortic valve area, by VTI  measures 0.90 cm. Aortic valve mean gradient measures 58.0 mmHg. Aortic  valve Vmax measures 4.89 m/s.   2. Left ventricular ejection fraction, by estimation, is 60 to 65%. The  left ventricle has normal function. The left ventricle has no regional  wall motion abnormalities. There is mild concentric left ventricular  hypertrophy. Left ventricular diastolic  parameters are consistent with Grade I diastolic dysfunction (impaired  relaxation). There is a small intracavitary < 20 mmHg with Valsalva.   3. Right ventricular systolic function is normal. The right ventricular  size is normal. There is normal pulmonary artery systolic pressure.  4. Left  atrial size was mildly dilated.   5. The mitral valve is degenerative. No evidence of mitral valve  regurgitation.   Comparison(s): EF 50%, AS mean gradient 28 mmHG, peak 46 mmHg. Interval  increase to severe aortic stenosis.   Cardiac catheterization 11/03/2021  Mid RCA lesion is 99% stenosed.   Prox RCA lesion is 60% stenosed.   Mid LM lesion is 40% stenosed.   Ost LAD to Prox LAD lesion is 70% stenosed.   Prox LAD to Mid LAD lesion is 40% stenosed.   Prox Cx to Mid Cx lesion is 40% stenosed.   Mild to moderate distal left main stenosis.  Severe, heavily calcified ostial LAD stenosis (RFR 0.78 suggesting the lesion is flow limiting).  Mild mid Circumflex stenosis Large dominant RCA with eccentric, nodular calcific lesion in the proximal vessel. This is a moderate stenosis. The mid RCA has a severe heavily calcified stenosis.  Normal right heart pressures Severe aortic stenosis by echo (aortic valve not crossed today)   Recommendations: He has severe, heavily calcified CAD involving the ostial LAD and the mid RCA. PCI of the LAD lesion would be high risk. I have reviewed his films with our interventional and structural team and I think the best option is to consider CABG and AVR. Given recent syncope, will admit him to the telemetry unit and have CT surgery see him to review options for CABG with surgical AVR. He appears to be a good candidate for surgery.   Diagnostic Dominance: Right Intervention   Assessment & Plan   1.  Coronary artery disease/severe aortic stenosis-continues to heal well and increase his physical activity.  Underwent CABG x3/AVR on 11/04/2021.  Presented back to the operating room on 11/05/2021 for exploratory postoperative on heart for bleeding.  He received several units of blood products.  Details above.  Denies episodes of chest pain other than sternal soreness since being discharged from the hospital. Continue aspirin, atorvastatin, metoprolol Heart healthy  low-sodium diet-salty 6 given Increase physical activity as tolerated-maintain sternal precautions  Essential hypertension-BP today 101/62.  Well-controlled at home. Continue metoprolol, lisinopril Heart healthy low-sodium diet Increase physical activity as tolerated-maintain sternal precautions  Hyperlipidemia-04/09/2021: VLDL 21.4 10/24/2021: Cholesterol 195; HDL 101; LDL Cholesterol (Calc) 78; Triglycerides 76 Continue aspirin, atorvastatin Heart healthy low-sodium diet-salty 6 given Increase physical activity as tolerated-maintain sternal precautions Repeat fasting lipids and LFTs in 4 to 6 weeks.  Complete heart block-V-paced rhythm-Underwent PPM 11/09/2028.  Denies further episodes of lightheadedness, presyncope or syncope.  Pacemaker site well approximated, healing well, no signs of infection. Follows with EP  Disposition: Follow-up with Dr. Sallyanne Kuster in 3 months    Jossie Ng. Sundai Probert NP-C    11/27/2021, 8:47 AM [place] [address] Office [phone] Fax [phone]  Notice: This dictation was prepared with Dragon dictation along with smaller phrase technology. Any transcriptional errors that result from this process are unintentional and may not be corrected upon review.  I spent 14 minutes examining this patient, reviewing medications, and using patient centered shared decision making involving her cardiac care.  Prior to her visit I spent greater than 20 minutes reviewing her past medical history,  medications, and prior cardiac tests.

## 2021-11-24 NOTE — Telephone Encounter (Signed)
Pt insurance is active and benefits verified through [facility]. Co-pay $10.00, DED $0.00/$0.00 met, out of pocket $3,400.00/$40.00 met, co-insurance 0%. No pre-authorization required. Passport, 11/24/21 @ 2:19PM, DPT#[id number]   Will contact patient to see if he is interested in the Cardiac Rehab Program. If interested, patient will need to complete follow up appt. Once completed, patient will be contacted for scheduling upon review by the RN Navigator.

## 2021-11-24 NOTE — Progress Notes (Signed)
Location:  [place] Room Number: [place] of Service:  SNF (31) Provider:  Durenda Age, DNP, FNP-BC  Patient Care Team: Shelda Pal, DO as PCP - General (Family Medicine) Vickie Epley, MD as PCP - Electrophysiology (Cardiology) Croitoru, Dani Gobble, MD as PCP - Cardiology (Cardiology)  Extended Emergency Contact Information Primary Emergency Contact: [place] Phone: [phone] Mobile Phone: [phone] Relation: Son Secondary Emergency Contact: Saetern,[name] Mobile Phone: [phone] Relation: Son Preferred language: English Interpreter needed? No  Code Status:  Full Code  Goals of care: Advanced Directive information Advanced Directives 11/05/2021  Does Patient Have a Medical Advance Directive? Yes  Type of Advance Directive [place]  Does patient want to make changes to medical advance directive? No - Patient declined  Copy of [place] in Chart? No - copy requested  Would patient like information on creating a medical advance directive? -     Chief Complaint  Patient presents with   Hospitalization Follow-up    Admitted to [facility] on 11/21/21    HPI:  Pt is a 86 y.o. male who was admitted to [facility] on 11/21/21 post hospital admission 11/03/21 to 11/21/21. He has a PMH of PVCs,arthritis, GERD, hypertension, hyperlipidemia and aortic stenosis. He lightheaded and ultimately passed out while walking in his neighborhood.  He was referred to Dr.O'Neal, Cardiologist, by his PCP.  In 2017, he was diagnosed with aortic stenosis.  He has not been followed up since that time.  He was having lower extremity edema for several months.  Dr. Audie Box physical exam revealed a very loud murmur with an absent S2 and he was concerned that he had progression to severe aortic stenosis.  A repeat echocardiogram showed  EF of 60 to 65% with severe aortic stenosis.  There is small platelets were calcified and thickened with restricted leaflet motion.  He was referred to Dr. Angelena Form for possible TAVR procedure.  Cardiac catheterization was performed on 1/16 and showed 3v CAD.  On 11/04/2021, he underwent CABG x3.  He also underwent aortic valve replacement with a 23 mm Edwards Resilia bioprosthetic valve.  He underwent endoscopic harvest of the greater saphenous vein from his right leg.  He had high chest tube output and had increased pressor requirement.  He was treated with 2 units PRBC.Follow up labs showed coagulopathy and was treated with 4 FFP and 1 platelet.  He was acidotic and bicarb was administered.  The bleeding slowed some, however output was still >100 cc/hour.  It was felt that reexploration in the operating room was needed.  He was transfused additional 3 units of PRBC, 3 units of FFP, 2 platelets and a unit of FFP per the result.  Hemoglobin level improved to 9.2 postprocedure.  He was weaned off epinephrine and Levophed and extubated on POD #2.  He was started on IV Lasix for hypervolemic state.  He continued to require V. pacing with episodes of loss of capture.  EP consult was obtained and they felt permanent pacemaker would be indicated.  He underwent placement of a temporary transvenous pacer on 11/07/2021.  This was replaced with a permanent pacemaker on 11/09/2021.  His chest tube output decreased and his tubes were removed on 11/08/2021.  He developed renal injury with creatinine which steadily improved to normal range.  Acute blood loss anemia and thrombocytopenia stabilized.  Rehabilitation modalities have been slow due to pacer dependency.  He was hypertensive and was started on lisinopril.  Chest x-ray on 1/23 showed bilateral pneumothoraxes.  He underwent placement of a right and left side pigtail catheter.  Follow up chest x-ray showed some reexpansion of the lung.  Lopressor was added for additional BP  management.  His right pneumothorax resolved.  His right-sided pigtail catheter was removed on 11/12/2021.  Follow-up chest x-ray showed no evidence of pneumothorax.  He was transitioned to an oral regimen of Lasix and potassium.  His hemoglobin level remained stable.  His left chest tube remain free from air leak and was removed on 11/13/2021.  Follow-up chest x-ray showed minimal apical pneumothorax on the left.  He was transferred to the progressive care unit on 11/13/2021.  He is in a paced rhythm and his PPM is functioning appropriately.  Lisinopril was decreased to 10 mg daily due to low blood pressure.  Unfortunately, [employer] declined CIR placement and was transferred to [facility].  He was seen in his room today.  He requested for sleeping medication aid and melatonin was prescribed.   Past Medical History:  Diagnosis Date   Arthritis    Cataract    Esophageal stricture    GERD (gastroesophageal reflux disease)    Hyperlipidemia    Hypertension    Liver disease    hepatitis   Severe aortic stenosis    Past Surgical History:  Procedure Laterality Date   AORTIC VALVE REPLACEMENT N/A 11/04/2021   Procedure: AORTIC VALVE REPLACEMENT (AVR) USING 23 MM INSPIRIS RESILIA  AORTIC VALVE;  Surgeon: Lajuana Matte, MD;  Location: [facility];  Service: Open Heart Surgery;  Laterality: N/A;   cataract  2013   CORONARY ARTERY BYPASS GRAFT N/A 11/04/2021   Procedure: CORONARY ARTERY BYPASS GRAFTING (CABG) X 3, USING LEFT INTERNAL MAMMARY ARTERY AND ENDOSCOPICALLY HARVESTED RIGHT GREATER SAPHENOUS VEIN. LIMA TO LAD, SVG TO PDA, SVG TO OM;  Surgeon: Lajuana Matte, MD;  Location: [place];  Service: Open Heart Surgery;  Laterality: N/A;   ENDOVEIN HARVEST OF GREATER SAPHENOUS VEIN Right 11/04/2021   Procedure: ENDOVEIN HARVEST OF GREATER SAPHENOUS VEIN;  Surgeon: Lajuana Matte, MD;  Location: [place];  Service: Open Heart Surgery;  Laterality: Right;   EXPLORATION POST OPERATIVE OPEN HEART N/A  11/05/2021   Procedure: EXPLORATION POST OPERATIVE OPEN HEARTFOR BLEEDING;  Surgeon: Melrose Nakayama, MD;  Location: [place];  Service: Open Heart Surgery;  Laterality: N/A;   HEMORROIDECTOMY  2001   HERNIA REPAIR  2005   INTRAVASCULAR PRESSURE WIRE/FFR STUDY N/A 11/03/2021   Procedure: INTRAVASCULAR PRESSURE WIRE/FFR STUDY;  Surgeon: Burnell Blanks, MD;  Location: [place] CV LAB;  Service: Cardiovascular;  Laterality: N/A;   PACEMAKER IMPLANT N/A 11/09/2021   Procedure: PACEMAKER IMPLANT;  Surgeon: Vickie Epley, MD;  Location: [place] [facility];  Service: Cardiovascular;  Laterality: N/A;   RIGHT/LEFT HEART CATH AND CORONARY ANGIOGRAPHY N/A 11/03/2021   Procedure: RIGHT/LEFT HEART CATH AND CORONARY ANGIOGRAPHY;  Surgeon: Burnell Blanks, MD;  Location: [place] [facility];  Service: Cardiovascular;  Laterality: N/A;   TEE WITHOUT CARDIOVERSION N/A 11/04/2021   Procedure: TRANSESOPHAGEAL ECHOCARDIOGRAM (TEE);  Surgeon: Lajuana Matte, MD;  Location: [place];  Service: Open Heart Surgery;  Laterality: N/A;   TEMPORARY PACEMAKER N/A 11/07/2021   Procedure: TEMPORARY PACEMAKER;  Surgeon: [name], MD;  Location: [place] [facility];  Service: Cardiovascular;  Laterality: N/A;   TONSILLECTOMY  1948    No Known Allergies  Outpatient Encounter Medications as of 11/24/2021  Medication Sig   acetaminophen (TYLENOL) 325 MG tablet Take 1-2 tablets (325-650 mg total) by mouth every 4 (four) hours as needed for mild pain.   aspirin EC 81 MG tablet Take 1 tablet (81 mg total) by mouth daily. Swallow whole.   atorvastatin (LIPITOR) 40 MG tablet Take 1 tablet (40 mg total) by mouth daily.   Glucosamine-Chondroit-Vit C-Mn (GLUCOSAMINE 1500 COMPLEX PO) Take 2 tablets by mouth every other day.   ketoconazole (NIZORAL) 2 % shampoo Apply 1 application topically once a week.   lisinopril (ZESTRIL) 10 MG tablet Take 1 tablet (10 mg total) by mouth daily.   metoprolol tartrate  (LOPRESSOR) 25 MG tablet Take 1 tablet (25 mg total) by mouth 2 (two) times daily.   Multiple Vitamin (MULTIVITAMIN) tablet Take 1 tablet by mouth daily.   omeprazole (PRILOSEC) 40 MG capsule TAKE 1 CAPSULE EVERY DAY   traMADol (ULTRAM) 50 MG tablet Take 1 tablet (50 mg total) by mouth every 6 (six) hours as needed for moderate pain.   vitamin B-12 (CYANOCOBALAMIN) 1000 MCG tablet Take 1,000 mcg by mouth daily.   No facility-administered encounter medications on file as of 11/24/2021.    Review of Systems  Constitutional:  Negative for activity change, appetite change and fever.  HENT:  Negative for sore throat.   Eyes: Negative.   Cardiovascular:  Negative for chest pain and leg swelling.  Gastrointestinal:  Negative for abdominal distention, diarrhea and vomiting.  Genitourinary:  Negative for dysuria, frequency and urgency.  Skin:  Negative for color change.  Neurological:  Negative for dizziness and headaches.  Psychiatric/Behavioral:  Negative for behavioral problems and sleep disturbance. The patient is not nervous/anxious.       Immunization History  Administered Date(s) Administered   Influenza, High Dose Seasonal PF 07/19/2017, 06/19/2020   Influenza-Unspecified 07/20/2015, 07/10/2021   PFIZER Comirnaty(Gray Top)Covid-19 Tri-Sucrose Vaccine 01/23/2021   PFIZER(Purple Top)SARS-COV-2 Vaccination 12/03/2019, 12/25/2019, 07/24/2020   PNEUMOCOCCAL CONJUGATE-20 10/24/2021   Pfizer Covid-19 Vaccine Bivalent Booster 49yr & up 07/10/2021   Pneumococcal Polysaccharide-23 04/22/2018   Tdap 04/22/2018   Pertinent  Health Maintenance Due  Topic Date Due   INFLUENZA VACCINE  Completed   Fall Risk 11/19/2021 11/19/2021 11/20/2021 11/20/2021 11/21/2021  Falls in the past year? - - - - -  Was there an injury with Fall? - - - - -  Fall Risk Category Calculator - - - - -  Fall Risk Category - - - - -  Patient Fall Risk Level Moderate fall risk Moderate fall risk Moderate fall risk Moderate fall  risk Moderate fall risk  Patient at Risk for Falls Due to - - - - -  Fall risk Follow up - - - - -     Vitals:   11/24/21 1418  BP: 119/68  Pulse: 75  Resp: 20  Temp: 97.7 F (36.5 C)  Weight: 172 lb 6.4 oz (78.2 kg)  Height: 5' 11"  (1.803 m)   Body mass index is 24.04 kg/m.  Physical Exam Constitutional:      Appearance: Normal appearance.  HENT:     Head: Normocephalic and atraumatic.     Mouth/Throat:     Mouth: Mucous membranes are moist.  Eyes:     Conjunctiva/sclera: Conjunctivae normal.  Cardiovascular:     Rate and Rhythm: Normal rate and regular rhythm.     Pulses: Normal pulses.     Heart sounds: Normal heart sounds.     Comments:  Left chest pacemaker Pulmonary:     Effort: Pulmonary effort is normal.     Breath sounds: Normal breath sounds.  Abdominal:     General: Bowel sounds are normal.     Palpations: Abdomen is soft.  Musculoskeletal:        General: No swelling. Normal range of motion.     Cervical back: Normal range of motion.  Skin:    General: Skin is warm and dry.     Comments: Chest Midline and abdominal surgical wound,  healed, no redness.  Neurological:     General: No focal deficit present.     Mental Status: He is alert and oriented to person, place, and time.  Psychiatric:        Mood and Affect: Mood normal.        Behavior: Behavior normal.        Thought Content: Thought content normal.        Judgment: Judgment normal.       Labs reviewed: Recent Labs    11/05/21 1649 11/06/21 0312 11/06/21 0716 11/11/21 0104 11/12/21 0053 11/13/21 0148  NA 140 141   < > 130* 129* 132*  K 3.9 3.7   < > 4.0 3.6 4.3  CL 103 107   < > 96* 94* 96*  CO2 24 26   < > 24 24 27   GLUCOSE 134* 136*   < > 112* 107* 111*  BUN 11 12   < > 14 14 16   CREATININE 1.30* 1.38*   < > 0.98 0.94 1.03  CALCIUM 7.3* 7.1*   < > 8.4* 8.5* 8.5*  MG 2.1 2.2  --   --  1.6*  --   PHOS  --   --   --   --  3.5  --    < > = values in this interval not  displayed.   Recent Labs    04/09/21 1033 10/24/21 1531 11/03/21 2003  AST 33 50* 36  ALT 17 26 24   ALKPHOS 58  --  55  BILITOT 1.1 1.2 1.7*  PROT 7.1 7.1 6.3*  ALBUMIN 4.5  --  3.5   Recent Labs    08/01/21 1626 09/25/21 1133 11/11/21 0104 11/12/21 0053 11/13/21 0148  WBC 6.1   < > 11.2* 10.7* 10.0  NEUTROABS 3,752  --   --   --   --   HGB 13.9   < > 9.8* 10.5* 9.8*  HCT 39.6   < > 28.8* 30.8* 29.6*  MCV 98.5   < > 87.0 87.3 87.8  PLT 126*   < > 126* 142* 181   < > = values in this interval not displayed.   Lab Results  Component Value Date   TSH 1.150 09/25/2021   Lab Results  Component Value Date   HGBA1C 5.3 11/03/2021   Lab Results  Component Value Date   CHOL 195 10/24/2021   HDL 101 10/24/2021   LDLCALC 78 10/24/2021   TRIG 76 10/24/2021   CHOLHDL 1.9 10/24/2021    Significant Diagnostic Results in last 30 days:  DG Chest 2 View  Result Date: 11/14/2021 CLINICAL DATA:  Chest tube removal EXAM: CHEST - 2 VIEW COMPARISON:  11/13/2021 FINDINGS: Interval removal of left basilar chest tube. Tiny left apical pneumothorax (less than 5% volume). No right-sided pneumothorax. Left chest wall cardiac device remains in place. Post CABG changes. Stable heart size. No abnormal shift of the heart or mediastinal structures. Mild left basilar  atelectasis. Small bilateral pleural effusions. IMPRESSION: Interval removal of left basilar chest tube with tiny left apical pneumothorax. These results will be called to the ordering clinician or representative by the Radiologist Assistant, and communication documented in the PACS or [employer]. Electronically Signed   By: Davina Poke D.O.   On: 11/14/2021 08:52   CARDIAC CATHETERIZATION  Result Date: 11/07/2021 1.  Successful temporary pacemaker placement with threshold of 0.4 mA, rate of 60 bpm, and output of 15 mA; epicardial pacing was resumed at 80 bpm with the output of 15 mA.   CARDIAC CATHETERIZATION  Result  Date: 11/03/2021   Mid RCA lesion is 99% stenosed.   Prox RCA lesion is 60% stenosed.   Mid LM lesion is 40% stenosed.   Ost LAD to Prox LAD lesion is 70% stenosed.   Prox LAD to Mid LAD lesion is 40% stenosed.   Prox Cx to Mid Cx lesion is 40% stenosed. Mild to moderate distal left main stenosis. Severe, heavily calcified ostial LAD stenosis (RFR 0.78 suggesting the lesion is flow limiting). Mild mid Circumflex stenosis Large dominant RCA with eccentric, nodular calcific lesion in the proximal vessel. This is a moderate stenosis. The mid RCA has a severe heavily calcified stenosis. Normal right heart pressures Severe aortic stenosis by echo (aortic valve not crossed today) Recommendations: He has severe, heavily calcified CAD involving the ostial LAD and the mid RCA. PCI of the LAD lesion would be high risk. I have reviewed his films with our interventional and structural team and I think the best option is to consider CABG and AVR. Given recent syncope, will admit him to the telemetry unit and have CT surgery see him to review options for CABG with surgical AVR. He appears to be a good candidate for surgery.   EP PPM/ICD IMPLANT  Result Date: 11/09/2021  CONCLUSIONS:  1. Symptomatic complete heart block  2. Successful dual chamber permanent pacemaker implantation  3.  No early apparent complications.   CT CORONARY MORPH W/CTA COR W/SCORE W/CA W/CM &/OR WO/CM  Addendum Date: 11/02/2021   ADDENDUM REPORT: 11/02/2021 16:23 CLINICAL DATA:  Severe Aortic Stenosis. EXAM: Cardiac TAVR CT TECHNIQUE: A non-contrast, gated CT scan was obtained with axial slices of 3 mm through the heart for aortic valve calcium scoring. A 110 kV retrospective, gated, contrast cardiac scan was obtained. Gantry rotation speed was 250 msecs and collimation was 0.6 mm. Nitroglycerin was not given. The 3D data set was reconstructed in 5% intervals of the 0-95% of the R-R cycle. Systolic and diastolic phases were analyzed on a dedicated  workstation using MPR, MIP, and VRT modes. The patient received 100 cc of contrast. FINDINGS: Image quality: Average. Ectopic beat occurred during the study. Millisecond reconstruction performed. Noise artifact is: Limited. Valve Morphology: The aortic valve is tricuspid and severely calcified. There is severe, diffuse, bulky calcification of all leaflets. Aortic Valve Calcium score: 5187 Aortic annular dimension: Phase assessed: 250 msec Annular area: 500 mm2 Annular perimeter: 80.5 mm Max diameter: 27.9 mm Min diameter: 23.3 mm Annular and subannular calcification: Trivial, single calcification under the RCC. Membranous septum length: 4.9 mm Optimal coplanar projection: RAO 7 CAU 19 Coronary Artery Height above Annulus: Left Main: 16.6 mm Right Coronary: 21.8 mm Sinus of Valsalva Measurements: Non-coronary: 33.9 mm Right-coronary: 33.6 mm Left-coronary: 32.7 mm Sinus of Valsalva Height: Non-coronary: 22.0 mm Right-coronary: 24.5 mm Left-coronary: 22.9 mm Sinotubular Junction: 29 mm Ascending Thoracic Aorta: 32 mm Coronary Arteries: Normal coronary origin. Right dominance.  The study was performed without use of NTG and is insufficient for plaque evaluation. Please refer to recent cardiac catheterization for coronary assessment. Severe 3-vessel coronary calcifications noted. Cardiac Morphology: Right Atrium: Right atrial size is within normal limits. Right Ventricle: The right ventricular cavity is within normal limits. Left Atrium: Left atrial size is normal in size with no left atrial appendage filling defect. Left Ventricle: The ventricular cavity size is within normal limits. There are no stigmata of prior infarction. There is no abnormal filling defect. Pulmonary arteries: Normal in size without proximal filling defect. Pulmonary veins: Normal pulmonary venous drainage. Pericardium: Normal thickness with no significant effusion or calcium present. Mitral Valve: The mitral valve is normal structure without  significant calcification. Extra-cardiac findings: See attached radiology report for non-cardiac structures. IMPRESSION: 1. Tricuspid aortic valve with diffuse, severe, bulky calcifications (calcium score 5187). 2. Annular measurements appropriate for 26 mm S3 TAVR (500 mm2). Measurements made at 250 msec. 3. Trivial, single, annular calcification under the RCC. 4. Sufficient coronary to annulus distance. 5. Optimal Fluoroscopic Angle for Delivery: RAO 7 CAU 19 6. Membranous septum length noted to be 4.9 mm. 7. Severe 3-vessel coronary calcifications. [place] T. Audie Box, MD Electronically Signed   By: Eleonore Chiquito M.D.   On: 11/02/2021 16:23   Result Date: 11/02/2021 EXAM: OVER-READ INTERPRETATION  CT CHEST The following report is an over-read performed by radiologist Dr. Salvatore Marvel of [facility], [place] on 10/31/2021. This over-read does not include interpretation of cardiac or coronary anatomy or pathology. The coronary CTA interpretation by the cardiologist is attached. COMPARISON:  None. FINDINGS: Please see the separate concurrent chest CT angiogram report for details. IMPRESSION: Please see the separate concurrent chest CT angiogram report for details. Electronically Signed: By: Ilona Sorrel M.D. On: 10/31/2021 15:24   DG Chest 1V REPEAT Same Day  Result Date: 11/10/2021 CLINICAL DATA:  86 year old male with history of bilateral chest tube placement. EXAM: CHEST - 1 VIEW SAME DAY COMPARISON:  Chest x-ray 11/10/2021. FINDINGS: Bilateral chest tubes are now in position. Left pigtail reformed over the lower left hemithorax. Right pigtail partially reformed projecting over the mid to upper right hemithorax. Previously noted left-sided pneumothorax persists, still moderate in size. Previously noted right-sided pneumothorax also persists, small to moderate in size. Bibasilar opacities are noted which may reflect areas of atelectasis and/or consolidation. Blunting of the costophrenic sulci bilaterally is  also noted, suggesting the presence of pleural fluid bilaterally. No evidence of pulmonary edema. Heart size is normal. Upper mediastinal contours are within normal limits. A small amount of pneumomediastinum is also evident. Atherosclerotic calcifications are noted in the thoracic aorta. Status post median sternotomy for CABG and aortic valve replacement. Left-sided pacemaker device in place with lead tips projecting over the expected location of the right atrium and right ventricle. IMPRESSION: 1. Status post placement of small bore bilateral chest tubes with persistent bilateral hydropneumothoraces, with moderate pneumothorax components bilaterally (these have decreased, but not yet resolved). Electronically Signed   By: Vinnie Langton M.D.   On: 11/10/2021 06:57   DG CHEST PORT 1 VIEW  Result Date: 11/13/2021 CLINICAL DATA:  Chest tube EXAM: PORTABLE CHEST 1 VIEW COMPARISON:  Chest x-ray dated November 12, 2021 FINDINGS: Interval removal right-sided chest tube. No evidence pneumothorax. No visible left pneumothorax. Left-sided chest tube in place. Cardiac and mediastinal contours are unchanged post median sternotomy. Left chest wall dual lead pacer with unchanged lead position. Mild left basilar opacity, likely atelectasis. Probable small bilateral pleural effusions.  IMPRESSION: Interval removal right-sided chest tube. No evidence of pneumothorax. Electronically Signed   By: Yetta Glassman M.D.   On: 11/13/2021 09:47   DG CHEST PORT 1 VIEW  Result Date: 11/12/2021 CLINICAL DATA:  Post sternotomy. EXAM: PORTABLE CHEST 1 VIEW COMPARISON:  Chest x-ray dated November 11, 2021 FINDINGS: Cardiac and mediastinal contours are unchanged post median sternotomy. Left chest wall dual lead pacer with unchanged lead position. Mild bibasilar opacities which are likely due to atelectasis. Trace left pneumothorax is stable to slightly decreased in size when compared with prior exam. Bilateral chest tubes in place.  IMPRESSION: Trace left pneumothorax is stable to slightly decreased in size when compared with prior exam. Bilateral chest tubes in place. Electronically Signed   By: Yetta Glassman M.D.   On: 11/12/2021 08:37   DG CHEST PORT 1 VIEW  Result Date: 11/11/2021 CLINICAL DATA:  Follow up pneumothorax. History of aortic valve replacement one week ago. EXAM: PORTABLE CHEST 1 VIEW COMPARISON:  Radiographs 11/10/2021.  CT 10/31/2021. FINDINGS: 0508 hours. Bilateral pigtail chest tubes remain in place with better formation of the right-sided catheter. No residual right-sided pneumothorax identified. There is a minimal residual left-sided pneumothorax. The heart size and mediastinal contours are stable status post CABG and aortic valve replacement. There are persistent small bilateral pleural effusions and mild bibasilar atelectasis. Left subclavian pacemaker leads appear unchanged. Skin staples are in place. IMPRESSION: Interval resolution of previously demonstrated right pneumothorax with improvement in the left-sided pneumothorax. No other significant changes. Electronically Signed   By: Richardean Sale M.D.   On: 11/11/2021 08:08   DG CHEST PORT 1 VIEW  Addendum Date: 11/10/2021   ADDENDUM REPORT: 11/10/2021 04:45 ADDENDUM: Critical findings were reported to Dr. Roxan Hockey at 443 a.m. Electronically Signed   By: Brett Fairy M.D.   On: 11/10/2021 04:45   Result Date: 11/10/2021 CLINICAL DATA:  Encounter for wheezing. EXAM: PORTABLE CHEST 1 VIEW COMPARISON:  11/08/2021. FINDINGS: The heart is enlarged and there is evidence of prior cardiothoracic surgery. A prosthetic cardiac valve is noted. Sternotomy wires are present over the midline. A left pacemaker is noted. There is a moderate pneumothorax on the left a large pneumothorax on the right with atelectasis and partial lung collapse. No definite effusion. No acute osseous abnormality. IMPRESSION: 1. Large pneumothorax on the right and moderate pneumothorax on  the left with bilateral atelectasis and partial lung collapse. 2. Cardiomegaly. Electronically Signed: By: Brett Fairy M.D. On: 11/10/2021 04:38   DG CHEST PORT 1 VIEW  Result Date: 11/08/2021 CLINICAL DATA:  Chest pain and shortness of breath. Status post CABG procedure. EXAM: PORTABLE CHEST 1 VIEW COMPARISON:  11/07/2021 FINDINGS: Status post CABG procedure right internal jugular pacer lead is identified terminating in the right ventricle. Mediastinal drain and left chest tube are stable in position. No pneumothorax visualized. Persistent small right pleural effusion. No interstitial edema or airspace consolidation. IMPRESSION: 1. Stable chest tube and mediastinal drain. No pneumothorax visualized. 2. Persistent small right pleural effusion. Electronically Signed   By: Kerby Moors M.D.   On: 11/08/2021 10:07   DG CHEST PORT 1 VIEW  Result Date: 11/07/2021 CLINICAL DATA:  Status post coronary bypass surgery and aortic valve replacement EXAM: PORTABLE CHEST 1 VIEW COMPARISON:  Previous examinations including the study done earlier today. FINDINGS: There is interval placement of right IJ central venous catheter with its tip in the course of superior vena cava. There is interval removal of vascular sheath from right IJ. Metallic sutures  are seen in the sternum. Skin staples are noted. No interval changes are noted in the left chest tube and mediastinal drains. There are linear densities in both lower lung fields suggesting subsegmental atelectasis with interval improvement. There are no signs of alveolar pulmonary edema or new focal infiltrates. There is no significant pleural effusion or pneumothorax. IMPRESSION: Tip of right IJ central venous catheter is seen in the course of superior vena cava. There is interval improvement in aeration of lower lung fields suggesting decrease in subsegmental atelectasis. Electronically Signed   By: Elmer Picker M.D.   On: 11/07/2021 13:47   DG Chest Port 1  View  Result Date: 11/07/2021 CLINICAL DATA:  Chest tube, sore chest, post AVR EXAM: PORTABLE CHEST 1 VIEW COMPARISON:  Portable exam 0510 hours compared to 11/06/2021 FINDINGS: Interval removal of endotracheal and nasogastric tubes. RIGHT jugular central venous catheter with tip projecting over confluence of the SVC. Mediastinal drains and LEFT thoracostomy tube present. Normal heart size post CABG and AVR. Bibasilar atelectasis and small RIGHT pleural effusion. Remaining lungs clear. No pneumothorax. IMPRESSION: Bibasilar atelectasis and small RIGHT pleural effusion post cardiac surgery. Electronically Signed   By: Lavonia Dana M.D.   On: 11/07/2021 08:48   DG CHEST PORT 1 VIEW  Result Date: 11/06/2021 CLINICAL DATA:  Intubated, status post open heart surgery EXAM: PORTABLE CHEST 1 VIEW COMPARISON:  Chest radiograph 11/05/2021 FINDINGS: The endotracheal tube tip is approximately 3.9 cm from the carina. The right IJ vascular sheath terminates in the upper SVC. A left basilar chest tube and presumed right chest tube are stable. The enteric catheter again courses off the field of view. A tube is again seen terminating over the medial left hemithorax, likely reflecting a mediastinal drain as seen on the radiograph from 11/05/2021 at 11:56 a.m. Median sternotomy wires, mediastinal surgical clips, and an aortic valve prosthesis are again seen. Lung volumes are low. Probable trace right pleural effusions are again seen with hazy opacity over the right lower lobe likely reflecting atelectasis. Overall, aeration is not significantly changed. There is no appreciable pneumothorax. The bones are stable. IMPRESSION: 1. Support devices as above. 2. Probable trace bilateral pleural effusions, not significantly changed compared to the most recent prior study. No appreciable pneumothorax. Electronically Signed   By: Valetta Mole M.D.   On: 11/06/2021 09:05   DG CHEST PORT 1 VIEW  Result Date: 11/05/2021 CLINICAL DATA:   Bleeding EXAM: PORTABLE CHEST 1 VIEW COMPARISON:  X-ray abdomen 11/05/2021. chest x-ray 118 23:12 p.m. FINDINGS: Right chest wall internal jugular central venous catheter with tip overlying the expected region of the proximal superior vena cava approximately 7 cm from the superior cavoatrial junction. Enteric tube coursing below the hemidiaphragm coursing below diaphragm with tip and side port collimated off view. Adjacent to on the left coursing cranially of unclear etiology. Query epicardial pacing wires noted along the right heart border. Left inferior approach chest tube is noted with tip overlying the region of the left costophrenic angle. Previously identified mediastinal drain not visualized. The heart and mediastinal contours are unchanged. Status post aortic valve repair. No focal consolidation. No pulmonary edema. Trace left pleural effusion. Possible trace right pleural effusion. No pneumothorax. No acute osseous abnormality. IMPRESSION: 1. Right chest wall internal jugular central venous catheter with tip overlying the expected region of the proximal superior vena cava approximately 7 cm from the superior cavoatrial junction. 2. Enteric tube coursing below the hemidiaphragm coursing below diaphragm with tip and side port collimated  off view. Adjacent to on the left coursing cranially of unclear etiology. Consider x-ray abdomen for further evaluation. 3. Trace left pleural effusion with inferior approach left chest tube noted. Electronically Signed   By: Iven Finn M.D.   On: 11/05/2021 18:55   DG CHEST PORT 1 VIEW  Result Date: 11/05/2021 CLINICAL DATA:  Encounter for orogastric tube placement. Status post exploration postoperative open heart for bleeding today. EXAM: PORTABLE CHEST 1 VIEW COMPARISON:  Chest radiograph dated November 05, 2021 at 5:08 a.m. FINDINGS: The heart is enlarged. Interval increase in size of the right pleural effusion/atelectasis. Low lung volumes. No appreciable  pneumothorax. Endotracheal tube with distal tip approximately 3.6 cm above the carina. Feeding tube coursing below the diaphragm with distal tip not included. Right IJ access central line sheath is unchanged. Sternotomy wires are intact. IMPRESSION: 1. Endotracheal tube with distal tip projecting below the diaphragm and not included on the image. 2. Interval increase in bilateral pleural effusions, right greater than the left with bibasilar atelectasis. 3.  Lines and tubes as above. Electronically Signed   By: Keane Police D.O.   On: 11/05/2021 13:19   DG Chest Port 1 View  Result Date: 11/05/2021 CLINICAL DATA:  Status post open heart surgery. Endotracheal tube and chest tubes. EXAM: PORTABLE CHEST 1 VIEW COMPARISON:  Multiple prior chest radiographs including most recent dated November 04, 2021 FINDINGS: Endotracheal tube with distal tip approximately 3.5 cm above the carina, right IJ access central line, feeding tube and mediastinal and left pleural drain are unchanged. The heart is enlarged with evidence of CABG and prosthetic aortic valve. No appreciable pneumothorax. Bilateral lower lobe hazy opacities concerning for pleural effusion, underlying airspace disease can not be excluded. No acute osseous abnormality. IMPRESSION: 1.  Lines and tubes as above and unchanged. 2.   Stable cardiomegaly with postsurgical changes. 3. Small bilateral pleural effusions, right greater than the left with right basilar atelectasis, underlying airspace disease can not be excluded. Electronically Signed   By: Keane Police D.O.   On: 11/05/2021 09:01   DG Chest Port 1 View  Result Date: 11/04/2021 CLINICAL DATA:  Status post CABG, status post AVR. EXAM: PORTABLE CHEST 1 VIEW COMPARISON:  Chest x-ray 11/03/2021. FINDINGS: Endotracheal tube tip is 3.8 cm above carina. Enteric tube extends below the diaphragm. Right-sided central venous catheter tip projects over the proximal SVC. There are new sternotomy wires, prosthetic  heart valve and mediastinal clips. Mediastinal and left pleural drainage catheters are also new from prior study. Cardiomediastinal silhouette is enlarged compatible with recent cardiac surgery. Lung volumes are low. There is minimal left basilar atelectasis. There is no pleural effusion or pneumothorax. No acute fractures are seen. IMPRESSION: 1. Low lung volumes with left basilar atelectasis. 2. Endotracheal tube tip 3.8 cm above carina. 3. Other lines and tubes/postsurgical changes as above. Electronically Signed   By: Ronney Asters M.D.   On: 11/04/2021 20:13   DG CHEST PORT 1 VIEW  Result Date: 11/03/2021 CLINICAL DATA:  Preop evaluation for upcoming coronary bypass grafting EXAM: PORTABLE CHEST 1 VIEW COMPARISON:  07/11/2016, CT from 10/31/2021 FINDINGS: Cardiac shadow is within normal limits. Mild aortic calcifications are seen. The lungs are well aerated bilaterally. No focal infiltrate or sizable effusion is seen. No bony abnormality is noted. IMPRESSION: No acute abnormality noted. Electronically Signed   By: Inez Catalina M.D.   On: 11/03/2021 21:13   DG Abd Portable 1V  Result Date: 11/05/2021 CLINICAL DATA:  Status post laparotomy.  EXAM: PORTABLE ABDOMEN - 1 VIEW COMPARISON:  None. FINDINGS: Enteric tube with the tip projecting over the antrum of the stomach. No bowel dilatation to suggest obstruction. No evidence of pneumoperitoneum, portal venous gas or pneumatosis. No pathologic calcifications along the expected course of the ureters. Lap pad projecting over the right upper quadrant with 2 metallic needles. No acute osseous abnormality. IMPRESSION: Enteric tube with the tip projecting over the antrum of the stomach. Lap pad projecting over the right upper quadrant with 2 metallic needles. Electronically Signed   By: Kathreen Devoid M.D.   On: 11/05/2021 13:12   CT ANGIO CHEST AORTA W/CM & OR WO/CM  Result Date: 10/31/2021 CLINICAL DATA:  Aortic valve replacement (TAVR), pre-op eval. Critical  aortic valve stenosis. History of syncope and collapse. EXAM: CT ANGIOGRAPHY CHEST, ABDOMEN AND PELVIS TECHNIQUE: Multidetector CT imaging through the chest, abdomen and pelvis was performed using the standard protocol during bolus administration of intravenous contrast. Multiplanar reconstructed images and MIPs were obtained and reviewed to evaluate the vascular anatomy. RADIATION DOSE REDUCTION: This exam was performed according to the departmental dose-optimization program which includes automated exposure control, adjustment of the mA and/or kV according to patient size and/or use of iterative reconstruction technique. CONTRAST:  152m OMNIPAQUE IOHEXOL 350 MG/ML SOLN COMPARISON:  None. FINDINGS: CTA CHEST FINDINGS Cardiovascular: Top-normal heart size. Diffusely thickened and coarsely calcified aortic valve. No significant pericardial effusion/thickening. Three-vessel coronary atherosclerosis. Atherosclerotic nonaneurysmal thoracic aorta. Normal caliber pulmonary arteries. No central pulmonary emboli. Mediastinum/Nodes: No discrete thyroid nodules. Unremarkable esophagus. No pathologically enlarged axillary, mediastinal or hilar lymph nodes. Lungs/Pleura: No pneumothorax. No pleural effusion. Indistinct subsolid 1.8 cm peripheral right upper lobe nodule with tiny 0.3 cm solid component (series 7/image 34). Posterior right upper lobe 0.9 cm ground-glass pulmonary nodule (series 7/image 48). Anterior rate upper lobe tiny solid 0.3 cm pulmonary nodule (series 7/image 46). No acute consolidative airspace disease, lung masses or additional significant pulmonary nodules. Musculoskeletal: No aggressive appearing focal osseous lesions. Moderate thoracic spondylosis. CTA ABDOMEN AND PELVIS FINDINGS Hepatobiliary: Normal liver with no liver mass. Cholelithiasis. No biliary ductal dilatation. Pancreas: Normal, with no mass or duct dilation. Spleen: Normal size. No mass. Adrenals/Urinary Tract: Normal adrenals. No contour  deforming renal masses. No hydronephrosis. Nondistended bladder, grossly normal. Stomach/Bowel: Small hiatal hernia. Otherwise normal nondistended stomach. Normal caliber small bowel with no small bowel wall thickening. Normal appendix. Marked diffuse colonic diverticulosis, most prominent in the sigmoid colon, with no large bowel wall thickening or significant pericolonic fat stranding. Vascular/Lymphatic: Atherosclerotic nonaneurysmal abdominal aorta. No pathologically enlarged lymph nodes in the abdomen or pelvis. Reproductive: Mildly enlarged prostate. Other: No pneumoperitoneum, ascites or focal fluid collection. Musculoskeletal: No aggressive appearing focal osseous lesions. Marked lumbar degenerative disc disease. VASCULAR MEASUREMENTS PERTINENT TO TAVR: AORTA: Minimal Aortic Diameter-16.7 x 15.3 mm Severity of Aortic Calcification-moderate RIGHT PELVIS: Right Common Iliac Artery - Minimal Diameter-10.5 x 10.3 mm Tortuosity-mild Calcification-moderate Right External Iliac Artery - Minimal Diameter-7.5 x 7.4 mm Tortuosity-moderate Calcification-mild Right Common Femoral Artery - Minimal Diameter-7.7 x 4.3 mm Tortuosity-mild Calcification-marked LEFT PELVIS: Left Common Iliac Artery - Minimal Diameter-11.2 x 10.0 mm Tortuosity-moderate Calcification-moderate Left External Iliac Artery - Minimal Diameter-7.9 x 7.7 mm Tortuosity-moderate Calcification-mild Left Common Femoral Artery - Minimal Diameter-9.3 x 7.6 mm Tortuosity-mild Calcification-moderate Review of the MIP images confirms the above findings. IMPRESSION: 1. Vascular findings and measurements pertinent to potential TAVR, as detailed. 2. Diffusely thickened and coarsely calcified aortic valve, compatible with the reported history of critical aortic stenosis.  3. Multiple right upper lobe pulmonary nodules, the largest an indistinct 1.8 cm peripheral right upper lobe subsolid pulmonary nodule with tiny 0.3 cm solid component. Non-contrast chest CT at 3-6  months is recommended. If nodules persist, subsequent management will be based upon the most suspicious nodule(s). This recommendation follows the consensus statement: Guidelines for Management of Incidental Pulmonary Nodules Detected on CT Images: From the Fleischner Society 2017; Radiology 2017; 284:228-243. 4. Three-vessel coronary atherosclerosis. 5. Small hiatal hernia. 6. Marked diffuse colonic diverticulosis. 7. Mildly enlarged prostate. 8. Cholelithiasis. 9. Aortic Atherosclerosis (ICD10-I70.0). Electronically Signed   By: Ilona Sorrel M.D.   On: 10/31/2021 16:07   ECHO INTRAOPERATIVE TEE  Result Date: 11/05/2021  *INTRAOPERATIVE TRANSESOPHAGEAL REPORT *  Patient Name:   RAHN LACUESTA Date of Exam: 11/04/2021 Medical Rec #:  [id number]        Height:       71.5 in Accession #:    [id number]       Weight:       181.5 lb Date of Birth:  1936/01/18       BSA:          2.03 m Patient Age:    60 years         BP:           109/76 mmHg Patient Gender: M                HR:           80 bpm. Exam Location:  Anesthesiology Transesophogeal exam was perform intraoperatively during surgical procedure. Patient was closely monitored under general anesthesia during the entirety of examination. Indications:     Aortic Stenosis i35.0, CAD Native Vessel i25.10 Sonographer:     Raquel Sarna Senior RDCS Performing Phys: [id number] Lucile Crater LIGHTFOOT Diagnosing Phys: Hoy Morn MD Complications: No known complications during this procedure. POST-OP IMPRESSIONS _ Left Ventricle: has normal systolic function, with an ejection fraction of 60%. The cavity size was decreased. The wall motion is normal. _ Right Ventricle: The right ventricle appears unchanged from pre-bypass. _ Aorta: The aorta appears unchanged from pre-bypass. _ Left Atrial Appendage: The left atrial appendage appears unchanged from pre-bypass. _ Aortic Valve: No stenosis present. No regurgitation post repair. Perivalvular leak noted.s/p 99m Inspiris valve with  small PVL noted and discussed with surgeon. _ Mitral Valve: The mitral valve appears unchanged from pre-bypass. _ Tricuspid Valve: The tricuspid valve appears unchanged from pre-bypass. _ Pulmonic Valve: The pulmonic valve appears unchanged from pre-bypass. _ Interatrial Septum: The interatrial septum appears unchanged from pre-bypass. _ Pericardium: The pericardium appears unchanged from pre-bypass. PRE-OP FINDINGS  Left Ventricle: The left ventricle has normal systolic function, with an ejection fraction of 60-65%. The cavity size was normal. There is mild concentric left ventricular hypertrophy. Right Ventricle: The right ventricle has normal systolic function. The cavity was normal. There is no increase in right ventricular wall thickness. Left Atrium: Left atrial size was dilated. No left atrial/left atrial appendage thrombus was detected. Right Atrium: Right atrial size was normal in size. Interatrial Septum: No atrial level shunt detected by color flow Doppler. Pericardium: There is no evidence of pericardial effusion. Mitral Valve: The mitral valve is normal in structure. Mitral valve regurgitation is trivial by color flow Doppler. Tricuspid Valve: The tricuspid valve was normal in structure. Tricuspid valve regurgitation was not visualized by color flow Doppler. Aortic Valve: The aortic valve is tricuspid Aortic valve regurgitation is mild by color flow Doppler. There is  severe stenosis of the aortic valve. Pulmonic Valve: The pulmonic valve was normal in structure. Pulmonic valve regurgitation is not visualized by color flow Doppler. Aorta: The aortic root, ascending aorta and aortic arch are normal in size and structure. There is evidence of plaque in the descending aorta. Pulmonary Artery: The pulmonary artery is of normal size. +--------------+--------++  LEFT VENTRICLE            +--------------+--------++  PLAX 2D                   +--------------+--------++  LVOT diam:     2.40 cm     +--------------+--------++  LVOT Area:     4.52 cm   +--------------+--------++                            +--------------+--------++  +--------------+-------+  SHUNTS                  +--------------+-------+  Systemic Diam: 2.40 cm  +--------------+-------+  Hoy Morn MD Electronically signed by Hoy Morn MD Signature Date/Time: 11/05/2021/3:46:21 PM    Final    VAS US DOPPLER PRE CABG  Result Date: 11/04/2021 PREOPERATIVE VASCULAR EVALUATION Patient Name:  JORDANI NUNN  Date of Exam:   11/03/2021 Medical Rec #: [id number]         Accession #:    [id number] Date of Birth: August 22, 1936        Patient Gender: M Patient Age:   59 years Exam Location:  [facility] Procedure:      VAS US DOPPLER PRE CABG Referring Phys: Gilford Raid --------------------------------------------------------------------------------  Indications:  Pre-CABG. Risk Factors: Hypertension, hyperlipidemia. Performing Technologist: Archie Patten RVS  Examination Guidelines: A complete evaluation includes B-mode imaging, spectral Doppler, color Doppler, and power Doppler as needed of all accessible portions of each vessel. Bilateral testing is considered an integral part of a complete examination. Limited examinations for reoccurring indications may be performed as noted.  Right Carotid Findings: +----------+--------+--------+--------+-------------------------+---------+             PSV cm/s EDV cm/s Stenosis Describe                  Comments   +----------+--------+--------+--------+-------------------------+---------+  CCA Prox   95       21                heterogenous                         +----------+--------+--------+--------+-------------------------+---------+  CCA Distal 63       14                heterogenous and calcific            +----------+--------+--------+--------+-------------------------+---------+  ICA Prox   82       27       1-39%    heterogenous              Shadowing   +----------+--------+--------+--------+-------------------------+---------+  ICA Distal 96       27                                                     +----------+--------+--------+--------+-------------------------+---------+  ECA        75  7                                                      +----------+--------+--------+--------+-------------------------+---------+ +----------+--------+-------+--------+------------+             PSV cm/s EDV cms Describe Arm Pressure  +----------+--------+-------+--------+------------+  Subclavian 94                                      +----------+--------+-------+--------+------------+ +---------+--------+--+--------+-+---------+  Vertebral PSV cm/s 38 EDV cm/s 9 Antegrade  +---------+--------+--+--------+-+---------+ Left Carotid Findings: +----------+--------+--------+--------+------------+--------+             PSV cm/s EDV cm/s Stenosis Describe     Comments  +----------+--------+--------+--------+------------+--------+  CCA Prox   111      27                heterogenous           +----------+--------+--------+--------+------------+--------+  CCA Distal 70       17                heterogenous           +----------+--------+--------+--------+------------+--------+  ICA Prox   84       27       1-39%    heterogenous           +----------+--------+--------+--------+------------+--------+  ICA Distal 69       22                                       +----------+--------+--------+--------+------------+--------+  ECA        75       11                                       +----------+--------+--------+--------+------------+--------+ +----------+--------+--------+--------+------------+  Subclavian PSV cm/s EDV cm/s Describe Arm Pressure  +----------+--------+--------+--------+------------+             115                                      +----------+--------+--------+--------+------------+ +---------+--------+--+--------+--+---------+  Vertebral PSV cm/s 39 EDV  cm/s 11 Antegrade  +---------+--------+--+--------+--+---------+  ABI Findings: +---------+------------------+-----+----------+--------+  Right     Rt Pressure (mmHg) Index Waveform   Comment   +---------+------------------+-----+----------+--------+  Brachial  148                      triphasic            +---------+------------------+-----+----------+--------+  PTA       255                1.72  monophasic           +---------+------------------+-----+----------+--------+  DP        147                0.99  monophasic           +---------+------------------+-----+----------+--------+  Great Toe 88  0.59                       +---------+------------------+-----+----------+--------+ +---------+------------------+-----+---------+-------+  Left      Lt Pressure (mmHg) Index Waveform  Comment  +---------+------------------+-----+---------+-------+  Brachial  131                      triphasic          +---------+------------------+-----+---------+-------+  PTA       171                1.16  biphasic           +---------+------------------+-----+---------+-------+  DP        123                0.83  biphasic           +---------+------------------+-----+---------+-------+  Great Toe 100                0.68                     +---------+------------------+-----+---------+-------+ +-------+---------------+----------------+  ABI/TBI Today's ABI/TBI Previous ABI/TBI  +-------+---------------+----------------+  Right   1.72            0.59              +-------+---------------+----------------+  Left    1.16            0.68              +-------+---------------+----------------+  Right Doppler Findings: +--------+--------+-----+---------+--------+  Site     Pressure Index Doppler   Comments  +--------+--------+-----+---------+--------+  Brachial 148            triphasic           +--------+--------+-----+---------+--------+  Radial                  triphasic            +--------+--------+-----+---------+--------+  Ulnar                   triphasic           +--------+--------+-----+---------+--------+  Left Doppler Findings: +--------+--------+-----+---------+--------+  Site     Pressure Index Doppler   Comments  +--------+--------+-----+---------+--------+  Brachial 131            triphasic           +--------+--------+-----+---------+--------+  Radial                  triphasic           +--------+--------+-----+---------+--------+  Ulnar                   triphasic           +--------+--------+-----+---------+--------+  Summary: Right Carotid: Velocities in the right ICA are consistent with a 1-39% stenosis. Left Carotid: Velocities in the left ICA are consistent with a 1-39% stenosis. Vertebrals: Bilateral vertebral arteries demonstrate antegrade flow. Right ABI: Resting right ankle-brachial index indicates noncompressible right lower extremity arteries. The right toe-brachial index is abnormal. Left ABI: Resting left ankle-brachial index is within normal range. No evidence of significant left lower extremity arterial disease. The left toe-brachial index is abnormal. Right Upper Extremity: Unable to obtain allens test due to TR band. Left Upper Extremity: Doppler waveform obliterate with left radial compression. Doppler waveforms remain within normal limits with left ulnar compression.  Electronically signed by Deitra Mayo  MD on 11/04/2021 at 3:31:06 PM.    Final    CT ANGIO ABDOMEN PELVIS  W &/OR WO CONTRAST  Result Date: 10/31/2021 CLINICAL DATA:  Aortic valve replacement (TAVR), pre-op eval. Critical aortic valve stenosis. History of syncope and collapse. EXAM: CT ANGIOGRAPHY CHEST, ABDOMEN AND PELVIS TECHNIQUE: Multidetector CT imaging through the chest, abdomen and pelvis was performed using the standard protocol during bolus administration of intravenous contrast. Multiplanar reconstructed images and MIPs were obtained and reviewed to evaluate the vascular  anatomy. RADIATION DOSE REDUCTION: This exam was performed according to the departmental dose-optimization program which includes automated exposure control, adjustment of the mA and/or kV according to patient size and/or use of iterative reconstruction technique. CONTRAST:  131m OMNIPAQUE IOHEXOL 350 MG/ML SOLN COMPARISON:  None. FINDINGS: CTA CHEST FINDINGS Cardiovascular: Top-normal heart size. Diffusely thickened and coarsely calcified aortic valve. No significant pericardial effusion/thickening. Three-vessel coronary atherosclerosis. Atherosclerotic nonaneurysmal thoracic aorta. Normal caliber pulmonary arteries. No central pulmonary emboli. Mediastinum/Nodes: No discrete thyroid nodules. Unremarkable esophagus. No pathologically enlarged axillary, mediastinal or hilar lymph nodes. Lungs/Pleura: No pneumothorax. No pleural effusion. Indistinct subsolid 1.8 cm peripheral right upper lobe nodule with tiny 0.3 cm solid component (series 7/image 34). Posterior right upper lobe 0.9 cm ground-glass pulmonary nodule (series 7/image 48). Anterior rate upper lobe tiny solid 0.3 cm pulmonary nodule (series 7/image 46). No acute consolidative airspace disease, lung masses or additional significant pulmonary nodules. Musculoskeletal: No aggressive appearing focal osseous lesions. Moderate thoracic spondylosis. CTA ABDOMEN AND PELVIS FINDINGS Hepatobiliary: Normal liver with no liver mass. Cholelithiasis. No biliary ductal dilatation. Pancreas: Normal, with no mass or duct dilation. Spleen: Normal size. No mass. Adrenals/Urinary Tract: Normal adrenals. No contour deforming renal masses. No hydronephrosis. Nondistended bladder, grossly normal. Stomach/Bowel: Small hiatal hernia. Otherwise normal nondistended stomach. Normal caliber small bowel with no small bowel wall thickening. Normal appendix. Marked diffuse colonic diverticulosis, most prominent in the sigmoid colon, with no large bowel wall thickening or significant  pericolonic fat stranding. Vascular/Lymphatic: Atherosclerotic nonaneurysmal abdominal aorta. No pathologically enlarged lymph nodes in the abdomen or pelvis. Reproductive: Mildly enlarged prostate. Other: No pneumoperitoneum, ascites or focal fluid collection. Musculoskeletal: No aggressive appearing focal osseous lesions. Marked lumbar degenerative disc disease. VASCULAR MEASUREMENTS PERTINENT TO TAVR: AORTA: Minimal Aortic Diameter-16.7 x 15.3 mm Severity of Aortic Calcification-moderate RIGHT PELVIS: Right Common Iliac Artery - Minimal Diameter-10.5 x 10.3 mm Tortuosity-mild Calcification-moderate Right External Iliac Artery - Minimal Diameter-7.5 x 7.4 mm Tortuosity-moderate Calcification-mild Right Common Femoral Artery - Minimal Diameter-7.7 x 4.3 mm Tortuosity-mild Calcification-marked LEFT PELVIS: Left Common Iliac Artery - Minimal Diameter-11.2 x 10.0 mm Tortuosity-moderate Calcification-moderate Left External Iliac Artery - Minimal Diameter-7.9 x 7.7 mm Tortuosity-moderate Calcification-mild Left Common Femoral Artery - Minimal Diameter-9.3 x 7.6 mm Tortuosity-mild Calcification-moderate Review of the MIP images confirms the above findings. IMPRESSION: 1. Vascular findings and measurements pertinent to potential TAVR, as detailed. 2. Diffusely thickened and coarsely calcified aortic valve, compatible with the reported history of critical aortic stenosis. 3. Multiple right upper lobe pulmonary nodules, the largest an indistinct 1.8 cm peripheral right upper lobe subsolid pulmonary nodule with tiny 0.3 cm solid component. Non-contrast chest CT at 3-6 months is recommended. If nodules persist, subsequent management will be based upon the most suspicious nodule(s). This recommendation follows the consensus statement: Guidelines for Management of Incidental Pulmonary Nodules Detected on CT Images: From the Fleischner Society 2017; Radiology 2017; 284:228-243. 4. Three-vessel coronary atherosclerosis. 5. Small  hiatal hernia. 6. Marked diffuse colonic diverticulosis. 7. Mildly enlarged prostate.  8. Cholelithiasis. 9. Aortic Atherosclerosis (ICD10-I70.0). Electronically Signed   By: Ilona Sorrel M.D.   On: 10/31/2021 16:07    Assessment/Plan  1. Severe aortic stenosis  S/P CABG x 3  11/04/21 S/P aortic valve replacement with bioprosthetic valve -     Continue aspirin 81 mg daily, atorvastatin 40 mg once daily -   follow up with cardiology on 11/27/21 -   Continue tramadol 50 mg 1 tab every 6 hours PRN  2. CHB (complete heart block) ([place]) S/P placement of cardiac pacemaker 11/14/21 -   Follow-up with cardiovascular  3. Essential hypertension -   Continue lisinopril 10 mg 1 tablet daily and metoprolol tartrate 25 mg 1 tablet twice a day  4. Gastroesophageal reflux disease without esophagitis -   Continue omeprazole 40 mg 1 capsule daily  5. ABLA (acute blood loss anemia) Lab Results  Component Value Date   WBC 10.0 11/13/2021   HGB 9.8 (L) 11/13/2021   HCT 29.6 (L) 11/13/2021   MCV 87.8 11/13/2021   PLT 181 11/13/2021   -   S/P 5 units PRBC -    will monitor  6. Thrombocytopenia ([place]) Lab Results  Component Value Date   PLT 181 11/13/2021   -    S/P transfusion of 5 units FFP and 3 platelets -   will monitor  7. Primary insomnia -Started on melatonin 5 mg 1 tablet at bedtime PRN     Family/ staff Communication: Discussed plan of care with resident and charge nurse.  Labs/tests ordered:     CBC, magnesium, BMP with EGFR    Durenda Age, DNP, MSN, FNP-BC [facility] [phone] (Monday-Friday 8:00 a.m. - 5:00 p.m.) [phone] (after hours)

## 2021-11-25 ENCOUNTER — Encounter: Payer: Self-pay | Admitting: Internal Medicine

## 2021-11-25 ENCOUNTER — Non-Acute Institutional Stay (SKILLED_NURSING_FACILITY): Payer: Medicare HMO | Admitting: Internal Medicine

## 2021-11-25 DIAGNOSIS — I350 Nonrheumatic aortic (valve) stenosis: Secondary | ICD-10-CM

## 2021-11-25 DIAGNOSIS — I10 Essential (primary) hypertension: Secondary | ICD-10-CM

## 2021-11-25 DIAGNOSIS — E785 Hyperlipidemia, unspecified: Secondary | ICD-10-CM | POA: Diagnosis not present

## 2021-11-25 DIAGNOSIS — I442 Atrioventricular block, complete: Secondary | ICD-10-CM | POA: Diagnosis not present

## 2021-11-25 DIAGNOSIS — Z951 Presence of aortocoronary bypass graft: Secondary | ICD-10-CM | POA: Diagnosis not present

## 2021-11-25 NOTE — Assessment & Plan Note (Signed)
CVTS follow-up 11/28/2021.

## 2021-11-25 NOTE — Assessment & Plan Note (Signed)
Cardiology follow-up 11/27/2021 with Coletta Memos, NP

## 2021-11-25 NOTE — Patient Instructions (Signed)
See assessment and plan under each diagnosis in the problem list and acutely for this visit 

## 2021-11-25 NOTE — Progress Notes (Signed)
NURSING HOME LOCATION:  [facility] ROOM NUMBER:  [id number]  CODE STATUS:  Full Code  PCP:  Shelda Pal, DO  This is a comprehensive admission note to this SNFperformed on this date less than 30 days from date of admission. Included are preadmission medical/surgical history; reconciled medication list; family history; social history and comprehensive review of systems.  Corrections and additions to the records were documented. Comprehensive physical exam was also performed. Additionally a clinical summary was entered for each active diagnosis pertinent to this admission in the Problem List to enhance continuity of care.  HPI: He was hospitalized for a total of 18 days, 1/16 - 12/19/3555 with complicated cardiovascular issues.  Cardiologist Dr. Audie Box saw him in referral from his PCP for syncope.  He felt lightheaded while ambulating and experienced syncope.  He actually states he had a total of 3 "blackouts" in October.  On 10/15/2021 and on 10/22/2021 he experienced syncope while sitting. He had also been experiencing lower extremity edema present for several months. This is in the context of history of severe aortic stenosis originally diagnosed in 2017.  There not been follow-up as he did not know this had been recommended. Dr. Audie Box felt that there has been progression of the severe aortic stenosis.  Echocardiogram revealed EF of 60 to 65% with a mean gradient of 58 mmHg and a peak gradient of 95.6. The valve leaflets clips were calcified and thickened with restricted leaflet motion. He was referred to Dr. Angelena Form for possible TAVR procedure.  Cardiac catheterization was performed as a prelude to the possible TAVR.  This revealed three-vessel CAD.  It was felt the patient would require CABG and conventional aortic valve replacement. On 11/04/2021 he underwent three-vessel CABG utilizing LIMA to LAD, SVG to OM, and SVG to PDA.  Aortic valve replacement with 23 mm  Edwards Resilia bioprosthetic valve was also completed.  Endoscopic harvest of greater saphenous veins was obtained from the right lower extremity. Overnight postop he had high chest tube output and increased pressure requirement for which he received 2 units of packed red cells.  Clinical coagulopathy was addressed with 4 units of fresh frozen plasma and 1 platelet transfusion.  Bleeding did respond but output was still greater than 100 cc/h.  Pacemaker leads were also capturing only intermittently.  Reexploration was performed; TEG was obtained and the patient was transfused with additional 3 units of packed cells, 4 units of FFP, and 2 units of platelets. Hemoglobin improved to 9.2 and he was able to be weaned off epinephrine and Levophed requirements decreased.  He was weaned and extubated on postop day 2 and weaned off all pressor support.  Hypervolemic state was treated with IV Lasix.  He required ventricular pacing with episodes of loss of capture.  EP consult was obtained; they recommended permanent pacemaker placement.  Temporary transvenous pacer was placed 1/20 and subsequently replaced with a permanent pacemaker on 1/22.  Epicardial pacing wires were removed without difficulty.  Chest tube output decreased and the tubes were removed on 1/21. Course was complicated by AKI which responded to medication & fluid adjustments. Lisinopril was initiated for hypertension. On 1/23 bilateral pneumothoraces were present on chest x-ray and right and left sided pigtail catheters were placed with some reexpansion of the lungs.  Right pneumothorax resolved and the right-sided pigtail catheter was removed on 1/25.  Subsequent chest x-ray revealed minimal apical pneumothorax on the left allowing removal of the catheter 1/26. Lopressor was added  for additional blood pressure control.  Subsequently blood pressure was soft and lisinopril was decreased to 10 mg daily. CIR placement was recommended but [employer] declined  to authorize this; therefore, SNF placement was pursued.  Past medical and surgical history: Includes GERD, essential hypertension, dyslipidemia, DJD, history of esophageal stricture, and history of hepatitis. Surgeries and procedures include hernia repair, tonsillectomy, hemorrhoidectomy, and cataract surgery.  Social history: Social drinker; former smoker having quit 50 years PTA.  Family history: Extensive history reviewed.  Both parents had history of heart failure.   Review of systems: He is an excellent historian and gave a comprehensive history of his recent hospitalization.  At this time he denies any significant cardiopulmonary symptoms.  He is experiencing pain in the left knee.  He had been taking Naprosyn at home on a regular basis PTA.  Constitutional: No fever, significant weight change, fatigue  Eyes: No redness, discharge, pain, vision change ENT/mouth: No nasal congestion, purulent discharge, earache, change in hearing, sore throat  Cardiovascular: No chest pain, palpitations, paroxysmal nocturnal dyspnea, claudication, edema  Respiratory: No cough, sputum production, hemoptysis, DOE, significant snoring, apnea Gastrointestinal: No heartburn, dysphagia, abdominal pain, nausea /vomiting, rectal bleeding, melena, change in bowels Genitourinary: No dysuria, hematuria, pyuria, incontinence, nocturia Dermatologic: No rash, pruritus, change in appearance of skin Neurologic: No dizziness, headache, syncope, seizures, numbness, tingling Psychiatric: No significant anxiety, depression, insomnia, anorexia Endocrine: No change in hair/skin/nails, excessive thirst, excessive hunger, excessive urination  Hematologic/lymphatic: No significant bruising, lymphadenopathy, abnormal bleeding Allergy/immunology: No itchy/watery eyes, significant sneezing, urticaria, angioedema  Physical exam:  Pertinent or positive findings: He appears his stated age.  Pattern alopecia is present.  Remaining  hair is disheveled.  His beard and mustache are unkempt.  He has bilateral ptosis.  Dental staining is present.  Pacer present at the left upper chest.  Breath sounds are decreased.  Heart sounds are slightly distant.  Pedal pulses are decreased.  The left knee reveals no change in temperature or color and no effusion is appreciated.  General appearance:  no acute distress, increased work of breathing is present.   Lymphatic: No lymphadenopathy about the head, neck, axilla. Eyes: No conjunctival inflammation or lid edema is present. There is no scleral icterus. Ears:  External ear exam shows no significant lesions or deformities.   Nose:  External nasal examination shows no deformity or inflammation. Nasal mucosa are pink and moist without lesions, exudates Oral exam: Lips and gums are healthy appearing.There is no oropharyngeal erythema or exudate. Neck:  No thyromegaly, masses, tenderness noted.    Heart:  No gallop, murmur, click, rub.  Lungs:  without wheezes, rhonchi, rales, rubs. Abdomen: Bowel sounds are normal.  Abdomen is soft and nontender with no organomegaly, hernias, masses. GU: Deferred  Extremities:  No cyanosis, clubbing, edema. Neurologic exam:  Balance, Rhomberg, finger to nose testing could not be completed due to clinical state Skin: Warm & dry w/o tenting. No significant lesions or rash.  See clinical summary under each active problem in the Problem List with associated updated therapeutic plan

## 2021-11-25 NOTE — Assessment & Plan Note (Signed)
CVTS follow-up with Dr. Kipp Brood 11/28/2021

## 2021-11-25 NOTE — Assessment & Plan Note (Signed)
Current LDL is 78; statin will be adjusted by PCP to obtain LDL goal less than 70.

## 2021-11-26 ENCOUNTER — Encounter: Payer: Self-pay | Admitting: Internal Medicine

## 2021-11-26 LAB — COMPREHENSIVE METABOLIC PANEL: Calcium: 9.4 (ref 8.7–10.7)

## 2021-11-26 LAB — CBC AND DIFFERENTIAL
HCT: 33 — AB (ref 41–53)
Hemoglobin: 11.4 — AB (ref 13.5–17.5)
Platelets: 238 (ref 150–399)
WBC: 8.7

## 2021-11-26 LAB — BASIC METABOLIC PANEL
BUN: 18 (ref 4–21)
CO2: 26 — AB (ref 13–22)
Chloride: 97 — AB (ref 99–108)
Creatinine: 1 (ref 0.6–1.3)
Glucose: 97
Potassium: 4.9 (ref 3.4–5.3)
Sodium: 132 — AB (ref 137–147)

## 2021-11-26 LAB — CBC: RBC: 3.84 — AB (ref 3.87–5.11)

## 2021-11-26 NOTE — Assessment & Plan Note (Addendum)
BP soft; if this persists or progresses ACE-I will be decreased further.

## 2021-11-27 ENCOUNTER — Other Ambulatory Visit: Payer: Self-pay

## 2021-11-27 ENCOUNTER — Encounter: Payer: Self-pay | Admitting: General Practice

## 2021-11-27 ENCOUNTER — Ambulatory Visit (INDEPENDENT_AMBULATORY_CARE_PROVIDER_SITE_OTHER): Payer: Medicare HMO | Admitting: General Practice

## 2021-11-27 VITALS — BP 101/62 | HR 100 | Ht 71.0 in | Wt 170.0 lb

## 2021-11-27 DIAGNOSIS — Z79899 Other long term (current) drug therapy: Secondary | ICD-10-CM

## 2021-11-27 DIAGNOSIS — I2510 Atherosclerotic heart disease of native coronary artery without angina pectoris: Secondary | ICD-10-CM | POA: Diagnosis not present

## 2021-11-27 DIAGNOSIS — E782 Mixed hyperlipidemia: Secondary | ICD-10-CM | POA: Diagnosis not present

## 2021-11-27 DIAGNOSIS — Z951 Presence of aortocoronary bypass graft: Secondary | ICD-10-CM | POA: Diagnosis not present

## 2021-11-27 DIAGNOSIS — I442 Atrioventricular block, complete: Secondary | ICD-10-CM | POA: Diagnosis not present

## 2021-11-27 DIAGNOSIS — I350 Nonrheumatic aortic (valve) stenosis: Secondary | ICD-10-CM

## 2021-11-27 DIAGNOSIS — I10 Essential (primary) hypertension: Secondary | ICD-10-CM

## 2021-11-27 NOTE — Patient Instructions (Signed)
Medication Instructions:  The current medical regimen is effective;  continue present plan and medications as directed. Please refer to the Current Medication list given to you today.  *If you need a refill on your cardiac medications before your next appointment, please call your pharmacy*  Lab Work:    FASTING LIPID AND LFT IN 4-6 WEEKS     Follow-Up: Your next appointment:  3 month(s) In Person with Sanda Klein, MD   At [facility], you and your health needs are our priority.  As part of our continuing mission to provide you with exceptional heart care, we have created designated Provider Care Teams.  These Care Teams include your primary Cardiologist (physician) and Advanced Practice Providers (APPs -  Physician Assistants and Nurse Practitioners) who all work together to provide you with the care you need, when you need it.

## 2021-11-28 ENCOUNTER — Non-Acute Institutional Stay (SKILLED_NURSING_FACILITY): Payer: Medicare HMO | Admitting: Adult Health

## 2021-11-28 ENCOUNTER — Other Ambulatory Visit (INDEPENDENT_AMBULATORY_CARE_PROVIDER_SITE_OTHER): Payer: Medicare HMO

## 2021-11-28 ENCOUNTER — Telehealth: Payer: Self-pay | Admitting: Thoracic Surgery (Cardiothoracic Vascular Surgery)

## 2021-11-28 ENCOUNTER — Encounter: Payer: Self-pay | Admitting: Adult Health

## 2021-11-28 DIAGNOSIS — I5043 Acute on chronic combined systolic (congestive) and diastolic (congestive) heart failure: Secondary | ICD-10-CM

## 2021-11-28 DIAGNOSIS — I350 Nonrheumatic aortic (valve) stenosis: Secondary | ICD-10-CM | POA: Diagnosis not present

## 2021-11-28 DIAGNOSIS — Z951 Presence of aortocoronary bypass graft: Secondary | ICD-10-CM | POA: Diagnosis not present

## 2021-11-28 DIAGNOSIS — Z953 Presence of xenogenic heart valve: Secondary | ICD-10-CM

## 2021-11-28 DIAGNOSIS — I9589 Other hypotension: Secondary | ICD-10-CM

## 2021-11-28 NOTE — Progress Notes (Signed)
Location:  [facility] Room Number: 315-V Place of Service:  SNF (31) Provider:  Durenda Age, DNP, FNP-BC  Patient Care Team: Shelda Pal, DO as PCP - General (Family Medicine) Vickie Epley, MD as PCP - Electrophysiology (Cardiology) Croitoru, Dani Gobble, MD as PCP - Cardiology (Cardiology)  Extended Emergency Contact Information Primary Emergency Contact: [place] of [place] Phone: [phone] Mobile Phone: [phone] Relation: Son Secondary Emergency Contact: Calandro,[name] Mobile Phone: [phone] Relation: Son Preferred language: English Interpreter needed? No  Code Status:  Full Code  Goals of care: Advanced Directive information Advanced Directives 11/28/2021  Does Patient Have a Medical Advance Directive? Yes  Type of Paramedic of [place];Living will  Does patient want to make changes to medical advance directive? No - Patient declined  Copy of [place] in Chart? Yes - validated most recent copy scanned in chart (See row information)  Would patient like information on creating a medical advance directive? -     Chief Complaint  Patient presents with   Acute Visit    Low blood pressure    HPI:  Pt is a 86 y.o. male seen today for for an acute visit for low BPs. SBPs ranging from 91 to 121. He is currently taking lisinopril 10 mg 1 tab daily and metoprolol tartrate 25 mg 1 tab twice a day for hypertension.  He denies having dizziness.  He was admitted to [facility] on 11/21/21 post hospital admission 11/03/2021 to 11/21/21.  He had severe aortic stenosis for which he had CABG x3 on 11/04/2021 and s/p aortic valve replacement with bioprosthetic valve.  He had complete heart block and had pacemaker placement on 11/14/2021.  He had acute blood loss for which he had transfusion of 5 units PRBC.  He had thrombocytopenia for which he had  transfusion of 5 units FFP and 3 platelets.  Past Medical History:  Diagnosis Date   Arthritis    Cataract    Esophageal stricture    GERD (gastroesophageal reflux disease)    Hyperlipidemia    Hypertension    Liver disease    hepatitis   Severe aortic stenosis    Past Surgical History:  Procedure Laterality Date   AORTIC VALVE REPLACEMENT N/A 11/04/2021   Procedure: AORTIC VALVE REPLACEMENT (AVR) USING 23 MM INSPIRIS RESILIA  AORTIC VALVE;  Surgeon: Lajuana Matte, MD;  Location: [facility];  Service: Open Heart Surgery;  Laterality: N/A;   cataract  2013   CORONARY ARTERY BYPASS GRAFT N/A 11/04/2021   Procedure: CORONARY ARTERY BYPASS GRAFTING (CABG) X 3, USING LEFT INTERNAL MAMMARY ARTERY AND ENDOSCOPICALLY HARVESTED RIGHT GREATER SAPHENOUS VEIN. LIMA TO LAD, SVG TO PDA, SVG TO OM;  Surgeon: Lajuana Matte, MD;  Location: [facility];  Service: Open Heart Surgery;  Laterality: N/A;   ENDOVEIN HARVEST OF GREATER SAPHENOUS VEIN Right 11/04/2021   Procedure: ENDOVEIN HARVEST OF GREATER SAPHENOUS VEIN;  Surgeon: Lajuana Matte, MD;  Location: [facility];  Service: Open Heart Surgery;  Laterality: Right;   EXPLORATION POST OPERATIVE OPEN HEART N/A 11/05/2021   Procedure: EXPLORATION POST OPERATIVE OPEN HEARTFOR BLEEDING;  Surgeon: Melrose Nakayama, MD;  Location: [facility];  Service: Open Heart Surgery;  Laterality: N/A;   HEMORROIDECTOMY  2001   HERNIA REPAIR  2005   INTRAVASCULAR PRESSURE WIRE/FFR STUDY N/A 11/03/2021   Procedure: INTRAVASCULAR PRESSURE WIRE/FFR STUDY;  Surgeon: Burnell Blanks, MD;  Location: [facility];  Service: Cardiovascular;  Laterality: N/A;   PACEMAKER IMPLANT N/A 11/09/2021   Procedure: PACEMAKER IMPLANT;  Surgeon: Vickie Epley, MD;  Location: [facility];  Service: Cardiovascular;  Laterality: N/A;   RIGHT/LEFT HEART CATH AND CORONARY ANGIOGRAPHY N/A 11/03/2021   Procedure: RIGHT/LEFT HEART CATH AND CORONARY ANGIOGRAPHY;  Surgeon:  Burnell Blanks, MD;  Location: [facility];  Service: Cardiovascular;  Laterality: N/A;   TEE WITHOUT CARDIOVERSION N/A 11/04/2021   Procedure: TRANSESOPHAGEAL ECHOCARDIOGRAM (TEE);  Surgeon: Lajuana Matte, MD;  Location: [facility];  Service: Open Heart Surgery;  Laterality: N/A;   TEMPORARY PACEMAKER N/A 11/07/2021   Procedure: TEMPORARY PACEMAKER;  Surgeon: [name], MD;  Location: [facility];  Service: Cardiovascular;  Laterality: N/A;   TONSILLECTOMY  1948    No Known Allergies  Outpatient Encounter Medications as of 11/28/2021  Medication Sig   acetaminophen (TYLENOL) 325 MG tablet Take 1-2 tablets (325-650 mg total) by mouth every 4 (four) hours as needed for mild pain.   aspirin EC 81 MG tablet Take 1 tablet (81 mg total) by mouth daily. Swallow whole.   atorvastatin (LIPITOR) 40 MG tablet Take 1 tablet (40 mg total) by mouth daily.   bisacodyl (DULCOLAX) 10 MG suppository If not relieved by MOM, give 10 mg Bisacodyl suppositiory rectally X 1 dose in 24 hours as needed   diclofenac Sodium (VOLTAREN) 1 % GEL APPLY TOPICALLY TO LEFT KNEE TWICE A DAY FOR PAIN   Glucosamine-Chondroit-Vit C-Mn (GLUCOSAMINE 1500 COMPLEX PO) Take 2 tablets by mouth every other day.   ketoconazole (NIZORAL) 2 % shampoo Apply 1 application topically once a week.   lisinopril (ZESTRIL) 5 MG tablet Take 5 mg by mouth daily. **Hold for SBP less than or equal to 105**   Magnesium Hydroxide (MILK OF MAGNESIA PO) If no BM in 3 days, give 30 cc Milk of Magnesium p.o. x 1 dose in 24 hours as needed (Do not use standing constipation orders for residents with renal failure CFR less than 30. Contact MD for orders) (Physician Order)   melatonin 5 MG TABS Take 5 mg by mouth.   metoprolol tartrate (LOPRESSOR) 25 MG tablet Take 12.5 mg by mouth 2 (two) times daily. **Hold for HR less than 60, SBP less than or equal to 105**   Multiple Vitamin (MULTIVITAMIN) tablet Take 1 tablet by mouth daily.    omeprazole (PRILOSEC) 40 MG capsule TAKE 1 CAPSULE EVERY DAY   traMADol (ULTRAM) 50 MG tablet Take 1 tablet (50 mg total) by mouth every 6 (six) hours as needed for moderate pain.   vitamin B-12 (CYANOCOBALAMIN) 1000 MCG tablet Take 1,000 mcg by mouth daily.   diclofenac (VOLTAREN) 50 MG EC tablet Take 50 mg by mouth 2 (two) times daily.   [DISCONTINUED] lisinopril (ZESTRIL) 10 MG tablet Take 1 tablet (10 mg total) by mouth daily.   [DISCONTINUED] magnesium hydroxide (MILK OF MAGNESIA) 400 MG/5ML suspension Take by mouth daily as needed for mild constipation.   [DISCONTINUED] metoprolol tartrate (LOPRESSOR) 25 MG tablet Take 1 tablet (25 mg total) by mouth 2 (two) times daily. (Patient taking differently: Take 12.5 mg by mouth 2 (two) times daily.)   No facility-administered encounter medications on file as of 11/28/2021.    Review of Systems  Constitutional:  Negative for activity change, appetite change and fever.  HENT:  Negative for sore throat.   Eyes: Negative.   Cardiovascular:  Negative for chest pain and leg swelling.  Gastrointestinal:  Negative for abdominal distention, diarrhea and vomiting.  Genitourinary:  Negative for dysuria, frequency and urgency.  Skin:  Negative for color change.  Neurological:  Negative for dizziness and headaches.  Psychiatric/Behavioral:  Negative for behavioral problems and sleep disturbance. The patient is not nervous/anxious.       Immunization History  Administered Date(s) Administered   Influenza, High Dose Seasonal PF 07/19/2017, 06/19/2020   Influenza-Unspecified 07/20/2015, 07/10/2021   PFIZER Comirnaty(Gray Top)Covid-19 Tri-Sucrose Vaccine 01/23/2021   PFIZER(Purple Top)SARS-COV-2 Vaccination 12/03/2019, 12/25/2019, 07/24/2020   PNEUMOCOCCAL CONJUGATE-20 10/24/2021   Pfizer Covid-19 Vaccine Bivalent Booster 45yrs & up 07/10/2021   Pneumococcal Polysaccharide-23 04/22/2018   Tdap 04/22/2018   Pertinent  Health Maintenance Due  Topic  Date Due   INFLUENZA VACCINE  Completed   Fall Risk 11/19/2021 11/19/2021 11/20/2021 11/20/2021 11/21/2021  Falls in the past year? - - - - -  Was there an injury with Fall? - - - - -  Fall Risk Category Calculator - - - - -  Fall Risk Category - - - - -  Patient Fall Risk Level Moderate fall risk Moderate fall risk Moderate fall risk Moderate fall risk Moderate fall risk  Patient at Risk for Falls Due to - - - - -  Fall risk Follow up - - - - -     Vitals:   11/28/21 1205  BP: (!) 86/59  Pulse: 78  Resp: 20  Temp: 98.1 F (36.7 C)  SpO2: 94%  Weight: 172 lb 6.4 oz (78.2 kg)  Height: 5\' 11"  (1.803 m)   Body mass index is 24.04 kg/m.  Physical Exam Constitutional:      Appearance: Normal appearance.  HENT:     Head: Normocephalic and atraumatic.     Mouth/Throat:     Mouth: Mucous membranes are moist.  Eyes:     Conjunctiva/sclera: Conjunctivae normal.  Cardiovascular:     Rate and Rhythm: Normal rate and regular rhythm.     Pulses: Normal pulses.     Heart sounds: Normal heart sounds.  Pulmonary:     Effort: Pulmonary effort is normal.     Breath sounds: Normal breath sounds.  Abdominal:     General: Bowel sounds are normal.     Palpations: Abdomen is soft.  Musculoskeletal:        General: No swelling. Normal range of motion.     Cervical back: Normal range of motion.  Skin:    General: Skin is warm and dry.  Neurological:     General: No focal deficit present.     Mental Status: He is alert and oriented to person, place, and time.  Psychiatric:        Mood and Affect: Mood normal.        Behavior: Behavior normal.        Thought Content: Thought content normal.        Judgment: Judgment normal.       Labs reviewed: Recent Labs    11/05/21 1649 11/06/21 0312 11/06/21 0716 11/11/21 0104 11/12/21 0053 11/13/21 0148  NA 140 141   < > 130* 129* 132*  K 3.9 3.7   < > 4.0 3.6 4.3  CL 103 107   < > 96* 94* 96*  CO2 24 26   < > 24 24 27   GLUCOSE 134* 136*    < > 112* 107* 111*  BUN 11 12   < > 14 14 16   CREATININE 1.30* 1.38*   < >  0.98 0.94 1.03  CALCIUM 7.3* 7.1*   < > 8.4* 8.5* 8.5*  MG 2.1 2.2  --   --  1.6*  --   PHOS  --   --   --   --  3.5  --    < > = values in this interval not displayed.   Recent Labs    04/09/21 1033 10/24/21 1531 11/03/21 2003  AST 33 50* 36  ALT 17 26 24   ALKPHOS 58  --  55  BILITOT 1.1 1.2 1.7*  PROT 7.1 7.1 6.3*  ALBUMIN 4.5  --  3.5   Recent Labs    08/01/21 1626 09/25/21 1133 11/11/21 0104 11/12/21 0053 11/13/21 0148  WBC 6.1   < > 11.2* 10.7* 10.0  NEUTROABS 3,752  --   --   --   --   HGB 13.9   < > 9.8* 10.5* 9.8*  HCT 39.6   < > 28.8* 30.8* 29.6*  MCV 98.5   < > 87.0 87.3 87.8  PLT 126*   < > 126* 142* 181   < > = values in this interval not displayed.   Lab Results  Component Value Date   TSH 1.150 09/25/2021   Lab Results  Component Value Date   HGBA1C 5.3 11/03/2021   Lab Results  Component Value Date   CHOL 195 10/24/2021   HDL 101 10/24/2021   LDLCALC 78 10/24/2021   TRIG 76 10/24/2021   CHOLHDL 1.9 10/24/2021    Significant Diagnostic Results in last 30 days:  DG Chest 2 View  Result Date: 11/14/2021 CLINICAL DATA:  Chest tube removal EXAM: CHEST - 2 VIEW COMPARISON:  11/13/2021 FINDINGS: Interval removal of left basilar chest tube. Tiny left apical pneumothorax (less than 5% volume). No right-sided pneumothorax. Left chest wall cardiac device remains in place. Post CABG changes. Stable heart size. No abnormal shift of the heart or mediastinal structures. Mild left basilar atelectasis. Small bilateral pleural effusions. IMPRESSION: Interval removal of left basilar chest tube with tiny left apical pneumothorax. These results will be called to the ordering clinician or representative by the Radiologist Assistant, and communication documented in the PACS or [employer]. Electronically Signed   By: Davina Poke D.O.   On: 11/14/2021 08:52   CARDIAC  CATHETERIZATION  Result Date: 11/07/2021 1.  Successful temporary pacemaker placement with threshold of 0.4 mA, rate of 60 bpm, and output of 15 mA; epicardial pacing was resumed at 80 bpm with the output of 15 mA.   CARDIAC CATHETERIZATION  Result Date: 11/03/2021   Mid RCA lesion is 99% stenosed.   Prox RCA lesion is 60% stenosed.   Mid LM lesion is 40% stenosed.   Ost LAD to Prox LAD lesion is 70% stenosed.   Prox LAD to Mid LAD lesion is 40% stenosed.   Prox Cx to Mid Cx lesion is 40% stenosed. Mild to moderate distal left main stenosis. Severe, heavily calcified ostial LAD stenosis (RFR 0.78 suggesting the lesion is flow limiting). Mild mid Circumflex stenosis Large dominant RCA with eccentric, nodular calcific lesion in the proximal vessel. This is a moderate stenosis. The mid RCA has a severe heavily calcified stenosis. Normal right heart pressures Severe aortic stenosis by echo (aortic valve not crossed today) Recommendations: He has severe, heavily calcified CAD involving the ostial LAD and the mid RCA. PCI of the LAD lesion would be high risk. I have reviewed his films with our interventional and structural team and I think  the best option is to consider CABG and AVR. Given recent syncope, will admit him to the telemetry unit and have CT surgery see him to review options for CABG with surgical AVR. He appears to be a good candidate for surgery.   EP PPM/ICD IMPLANT  Result Date: 11/09/2021  CONCLUSIONS:  1. Symptomatic complete heart block  2. Successful dual chamber permanent pacemaker implantation  3.  No early apparent complications.   CT CORONARY MORPH W/CTA COR W/SCORE W/CA W/CM &/OR WO/CM  Addendum Date: 11/02/2021   ADDENDUM REPORT: 11/02/2021 16:23 CLINICAL DATA:  Severe Aortic Stenosis. EXAM: Cardiac TAVR CT TECHNIQUE: A non-contrast, gated CT scan was obtained with axial slices of 3 mm through the heart for aortic valve calcium scoring. A 110 kV retrospective, gated, contrast  cardiac scan was obtained. Gantry rotation speed was 250 msecs and collimation was 0.6 mm. Nitroglycerin was not given. The 3D data set was reconstructed in 5% intervals of the 0-95% of the R-R cycle. Systolic and diastolic phases were analyzed on a dedicated workstation using MPR, MIP, and VRT modes. The patient received 100 cc of contrast. FINDINGS: Image quality: Average. Ectopic beat occurred during the study. Millisecond reconstruction performed. Noise artifact is: Limited. Valve Morphology: The aortic valve is tricuspid and severely calcified. There is severe, diffuse, bulky calcification of all leaflets. Aortic Valve Calcium score: 5187 Aortic annular dimension: Phase assessed: 250 msec Annular area: 500 mm2 Annular perimeter: 80.5 mm Max diameter: 27.9 mm Min diameter: 23.3 mm Annular and subannular calcification: Trivial, single calcification under the RCC. Membranous septum length: 4.9 mm Optimal coplanar projection: RAO 7 CAU 19 Coronary Artery Height above Annulus: Left Main: 16.6 mm Right Coronary: 21.8 mm Sinus of Valsalva Measurements: Non-coronary: 33.9 mm Right-coronary: 33.6 mm Left-coronary: 32.7 mm Sinus of Valsalva Height: Non-coronary: 22.0 mm Right-coronary: 24.5 mm Left-coronary: 22.9 mm Sinotubular Junction: 29 mm Ascending Thoracic Aorta: 32 mm Coronary Arteries: Normal coronary origin. Right dominance. The study was performed without use of NTG and is insufficient for plaque evaluation. Please refer to recent cardiac catheterization for coronary assessment. Severe 3-vessel coronary calcifications noted. Cardiac Morphology: Right Atrium: Right atrial size is within normal limits. Right Ventricle: The right ventricular cavity is within normal limits. Left Atrium: Left atrial size is normal in size with no left atrial appendage filling defect. Left Ventricle: The ventricular cavity size is within normal limits. There are no stigmata of prior infarction. There is no abnormal filling defect.  Pulmonary arteries: Normal in size without proximal filling defect. Pulmonary veins: Normal pulmonary venous drainage. Pericardium: Normal thickness with no significant effusion or calcium present. Mitral Valve: The mitral valve is normal structure without significant calcification. Extra-cardiac findings: See attached radiology report for non-cardiac structures. IMPRESSION: 1. Tricuspid aortic valve with diffuse, severe, bulky calcifications (calcium score 5187). 2. Annular measurements appropriate for 26 mm S3 TAVR (500 mm2). Measurements made at 250 msec. 3. Trivial, single, annular calcification under the RCC. 4. Sufficient coronary to annulus distance. 5. Optimal Fluoroscopic Angle for Delivery: RAO 7 CAU 19 6. Membranous septum length noted to be 4.9 mm. 7. Severe 3-vessel coronary calcifications. [place] T. Audie Box, MD Electronically Signed   By: Eleonore Chiquito M.D.   On: 11/02/2021 16:23   Result Date: 11/02/2021 EXAM: OVER-READ INTERPRETATION  CT CHEST The following report is an over-read performed by radiologist Dr. Salvatore Marvel of [facility], [place] on 10/31/2021. This over-read does not include interpretation of cardiac or coronary anatomy or pathology. The coronary CTA interpretation by  the cardiologist is attached. COMPARISON:  None. FINDINGS: Please see the separate concurrent chest CT angiogram report for details. IMPRESSION: Please see the separate concurrent chest CT angiogram report for details. Electronically Signed: By: Ilona Sorrel M.D. On: 10/31/2021 15:24   DG Chest 1V REPEAT Same Day  Result Date: 11/10/2021 CLINICAL DATA:  86 year old male with history of bilateral chest tube placement. EXAM: CHEST - 1 VIEW SAME DAY COMPARISON:  Chest x-ray 11/10/2021. FINDINGS: Bilateral chest tubes are now in position. Left pigtail reformed over the lower left hemithorax. Right pigtail partially reformed projecting over the mid to upper right hemithorax. Previously noted left-sided pneumothorax  persists, still moderate in size. Previously noted right-sided pneumothorax also persists, small to moderate in size. Bibasilar opacities are noted which may reflect areas of atelectasis and/or consolidation. Blunting of the costophrenic sulci bilaterally is also noted, suggesting the presence of pleural fluid bilaterally. No evidence of pulmonary edema. Heart size is normal. Upper mediastinal contours are within normal limits. A small amount of pneumomediastinum is also evident. Atherosclerotic calcifications are noted in the thoracic aorta. Status post median sternotomy for CABG and aortic valve replacement. Left-sided pacemaker device in place with lead tips projecting over the expected location of the right atrium and right ventricle. IMPRESSION: 1. Status post placement of small bore bilateral chest tubes with persistent bilateral hydropneumothoraces, with moderate pneumothorax components bilaterally (these have decreased, but not yet resolved). Electronically Signed   By: Vinnie Langton M.D.   On: 11/10/2021 06:57   DG CHEST PORT 1 VIEW  Result Date: 11/13/2021 CLINICAL DATA:  Chest tube EXAM: PORTABLE CHEST 1 VIEW COMPARISON:  Chest x-ray dated November 12, 2021 FINDINGS: Interval removal right-sided chest tube. No evidence pneumothorax. No visible left pneumothorax. Left-sided chest tube in place. Cardiac and mediastinal contours are unchanged post median sternotomy. Left chest wall dual lead pacer with unchanged lead position. Mild left basilar opacity, likely atelectasis. Probable small bilateral pleural effusions. IMPRESSION: Interval removal right-sided chest tube. No evidence of pneumothorax. Electronically Signed   By: Yetta Glassman M.D.   On: 11/13/2021 09:47   DG CHEST PORT 1 VIEW  Result Date: 11/12/2021 CLINICAL DATA:  Post sternotomy. EXAM: PORTABLE CHEST 1 VIEW COMPARISON:  Chest x-ray dated November 11, 2021 FINDINGS: Cardiac and mediastinal contours are unchanged post median  sternotomy. Left chest wall dual lead pacer with unchanged lead position. Mild bibasilar opacities which are likely due to atelectasis. Trace left pneumothorax is stable to slightly decreased in size when compared with prior exam. Bilateral chest tubes in place. IMPRESSION: Trace left pneumothorax is stable to slightly decreased in size when compared with prior exam. Bilateral chest tubes in place. Electronically Signed   By: Yetta Glassman M.D.   On: 11/12/2021 08:37   DG CHEST PORT 1 VIEW  Result Date: 11/11/2021 CLINICAL DATA:  Follow up pneumothorax. History of aortic valve replacement one week ago. EXAM: PORTABLE CHEST 1 VIEW COMPARISON:  Radiographs 11/10/2021.  CT 10/31/2021. FINDINGS: 0508 hours. Bilateral pigtail chest tubes remain in place with better formation of the right-sided catheter. No residual right-sided pneumothorax identified. There is a minimal residual left-sided pneumothorax. The heart size and mediastinal contours are stable status post CABG and aortic valve replacement. There are persistent small bilateral pleural effusions and mild bibasilar atelectasis. Left subclavian pacemaker leads appear unchanged. Skin staples are in place. IMPRESSION: Interval resolution of previously demonstrated right pneumothorax with improvement in the left-sided pneumothorax. No other significant changes. Electronically Signed   By: Gwyndolyn Saxon  Lin Landsman M.D.   On: 11/11/2021 08:08   DG CHEST PORT 1 VIEW  Addendum Date: 11/10/2021   ADDENDUM REPORT: 11/10/2021 04:45 ADDENDUM: Critical findings were reported to Dr. Roxan Hockey at 443 a.m. Electronically Signed   By: Brett Fairy M.D.   On: 11/10/2021 04:45   Result Date: 11/10/2021 CLINICAL DATA:  Encounter for wheezing. EXAM: PORTABLE CHEST 1 VIEW COMPARISON:  11/08/2021. FINDINGS: The heart is enlarged and there is evidence of prior cardiothoracic surgery. A prosthetic cardiac valve is noted. Sternotomy wires are present over the midline. A left  pacemaker is noted. There is a moderate pneumothorax on the left a large pneumothorax on the right with atelectasis and partial lung collapse. No definite effusion. No acute osseous abnormality. IMPRESSION: 1. Large pneumothorax on the right and moderate pneumothorax on the left with bilateral atelectasis and partial lung collapse. 2. Cardiomegaly. Electronically Signed: By: Brett Fairy M.D. On: 11/10/2021 04:38   DG CHEST PORT 1 VIEW  Result Date: 11/08/2021 CLINICAL DATA:  Chest pain and shortness of breath. Status post CABG procedure. EXAM: PORTABLE CHEST 1 VIEW COMPARISON:  11/07/2021 FINDINGS: Status post CABG procedure right internal jugular pacer lead is identified terminating in the right ventricle. Mediastinal drain and left chest tube are stable in position. No pneumothorax visualized. Persistent small right pleural effusion. No interstitial edema or airspace consolidation. IMPRESSION: 1. Stable chest tube and mediastinal drain. No pneumothorax visualized. 2. Persistent small right pleural effusion. Electronically Signed   By: Kerby Moors M.D.   On: 11/08/2021 10:07   DG CHEST PORT 1 VIEW  Result Date: 11/07/2021 CLINICAL DATA:  Status post coronary bypass surgery and aortic valve replacement EXAM: PORTABLE CHEST 1 VIEW COMPARISON:  Previous examinations including the study done earlier today. FINDINGS: There is interval placement of right IJ central venous catheter with its tip in the course of superior vena cava. There is interval removal of vascular sheath from right IJ. Metallic sutures are seen in the sternum. Skin staples are noted. No interval changes are noted in the left chest tube and mediastinal drains. There are linear densities in both lower lung fields suggesting subsegmental atelectasis with interval improvement. There are no signs of alveolar pulmonary edema or new focal infiltrates. There is no significant pleural effusion or pneumothorax. IMPRESSION: Tip of right IJ central  venous catheter is seen in the course of superior vena cava. There is interval improvement in aeration of lower lung fields suggesting decrease in subsegmental atelectasis. Electronically Signed   By: Elmer Picker M.D.   On: 11/07/2021 13:47   DG Chest Port 1 View  Result Date: 11/07/2021 CLINICAL DATA:  Chest tube, sore chest, post AVR EXAM: PORTABLE CHEST 1 VIEW COMPARISON:  Portable exam 0510 hours compared to 11/06/2021 FINDINGS: Interval removal of endotracheal and nasogastric tubes. RIGHT jugular central venous catheter with tip projecting over confluence of the SVC. Mediastinal drains and LEFT thoracostomy tube present. Normal heart size post CABG and AVR. Bibasilar atelectasis and small RIGHT pleural effusion. Remaining lungs clear. No pneumothorax. IMPRESSION: Bibasilar atelectasis and small RIGHT pleural effusion post cardiac surgery. Electronically Signed   By: Lavonia Dana M.D.   On: 11/07/2021 08:48   DG CHEST PORT 1 VIEW  Result Date: 11/06/2021 CLINICAL DATA:  Intubated, status post open heart surgery EXAM: PORTABLE CHEST 1 VIEW COMPARISON:  Chest radiograph 11/05/2021 FINDINGS: The endotracheal tube tip is approximately 3.9 cm from the carina. The right IJ vascular sheath terminates in the upper SVC. A left basilar chest  tube and presumed right chest tube are stable. The enteric catheter again courses off the field of view. A tube is again seen terminating over the medial left hemithorax, likely reflecting a mediastinal drain as seen on the radiograph from 11/05/2021 at 11:56 a.m. Median sternotomy wires, mediastinal surgical clips, and an aortic valve prosthesis are again seen. Lung volumes are low. Probable trace right pleural effusions are again seen with hazy opacity over the right lower lobe likely reflecting atelectasis. Overall, aeration is not significantly changed. There is no appreciable pneumothorax. The bones are stable. IMPRESSION: 1. Support devices as above. 2. Probable  trace bilateral pleural effusions, not significantly changed compared to the most recent prior study. No appreciable pneumothorax. Electronically Signed   By: Valetta Mole M.D.   On: 11/06/2021 09:05   DG CHEST PORT 1 VIEW  Result Date: 11/05/2021 CLINICAL DATA:  Bleeding EXAM: PORTABLE CHEST 1 VIEW COMPARISON:  X-ray abdomen 11/05/2021. chest x-ray 118 23:12 p.m. FINDINGS: Right chest wall internal jugular central venous catheter with tip overlying the expected region of the proximal superior vena cava approximately 7 cm from the superior cavoatrial junction. Enteric tube coursing below the hemidiaphragm coursing below diaphragm with tip and side port collimated off view. Adjacent to on the left coursing cranially of unclear etiology. Query epicardial pacing wires noted along the right heart border. Left inferior approach chest tube is noted with tip overlying the region of the left costophrenic angle. Previously identified mediastinal drain not visualized. The heart and mediastinal contours are unchanged. Status post aortic valve repair. No focal consolidation. No pulmonary edema. Trace left pleural effusion. Possible trace right pleural effusion. No pneumothorax. No acute osseous abnormality. IMPRESSION: 1. Right chest wall internal jugular central venous catheter with tip overlying the expected region of the proximal superior vena cava approximately 7 cm from the superior cavoatrial junction. 2. Enteric tube coursing below the hemidiaphragm coursing below diaphragm with tip and side port collimated off view. Adjacent to on the left coursing cranially of unclear etiology. Consider x-ray abdomen for further evaluation. 3. Trace left pleural effusion with inferior approach left chest tube noted. Electronically Signed   By: Iven Finn M.D.   On: 11/05/2021 18:55   DG CHEST PORT 1 VIEW  Result Date: 11/05/2021 CLINICAL DATA:  Encounter for orogastric tube placement. Status post exploration postoperative  open heart for bleeding today. EXAM: PORTABLE CHEST 1 VIEW COMPARISON:  Chest radiograph dated November 05, 2021 at 5:08 a.m. FINDINGS: The heart is enlarged. Interval increase in size of the right pleural effusion/atelectasis. Low lung volumes. No appreciable pneumothorax. Endotracheal tube with distal tip approximately 3.6 cm above the carina. Feeding tube coursing below the diaphragm with distal tip not included. Right IJ access central line sheath is unchanged. Sternotomy wires are intact. IMPRESSION: 1. Endotracheal tube with distal tip projecting below the diaphragm and not included on the image. 2. Interval increase in bilateral pleural effusions, right greater than the left with bibasilar atelectasis. 3.  Lines and tubes as above. Electronically Signed   By: Keane Police D.O.   On: 11/05/2021 13:19   DG Chest Port 1 View  Result Date: 11/05/2021 CLINICAL DATA:  Status post open heart surgery. Endotracheal tube and chest tubes. EXAM: PORTABLE CHEST 1 VIEW COMPARISON:  Multiple prior chest radiographs including most recent dated November 04, 2021 FINDINGS: Endotracheal tube with distal tip approximately 3.5 cm above the carina, right IJ access central line, feeding tube and mediastinal and left pleural drain are unchanged. The  heart is enlarged with evidence of CABG and prosthetic aortic valve. No appreciable pneumothorax. Bilateral lower lobe hazy opacities concerning for pleural effusion, underlying airspace disease can not be excluded. No acute osseous abnormality. IMPRESSION: 1.  Lines and tubes as above and unchanged. 2.   Stable cardiomegaly with postsurgical changes. 3. Small bilateral pleural effusions, right greater than the left with right basilar atelectasis, underlying airspace disease can not be excluded. Electronically Signed   By: Keane Police D.O.   On: 11/05/2021 09:01   DG Chest Port 1 View  Result Date: 11/04/2021 CLINICAL DATA:  Status post CABG, status post AVR. EXAM: PORTABLE CHEST  1 VIEW COMPARISON:  Chest x-ray 11/03/2021. FINDINGS: Endotracheal tube tip is 3.8 cm above carina. Enteric tube extends below the diaphragm. Right-sided central venous catheter tip projects over the proximal SVC. There are new sternotomy wires, prosthetic heart valve and mediastinal clips. Mediastinal and left pleural drainage catheters are also new from prior study. Cardiomediastinal silhouette is enlarged compatible with recent cardiac surgery. Lung volumes are low. There is minimal left basilar atelectasis. There is no pleural effusion or pneumothorax. No acute fractures are seen. IMPRESSION: 1. Low lung volumes with left basilar atelectasis. 2. Endotracheal tube tip 3.8 cm above carina. 3. Other lines and tubes/postsurgical changes as above. Electronically Signed   By: Ronney Asters M.D.   On: 11/04/2021 20:13   DG CHEST PORT 1 VIEW  Result Date: 11/03/2021 CLINICAL DATA:  Preop evaluation for upcoming coronary bypass grafting EXAM: PORTABLE CHEST 1 VIEW COMPARISON:  07/11/2016, CT from 10/31/2021 FINDINGS: Cardiac shadow is within normal limits. Mild aortic calcifications are seen. The lungs are well aerated bilaterally. No focal infiltrate or sizable effusion is seen. No bony abnormality is noted. IMPRESSION: No acute abnormality noted. Electronically Signed   By: Inez Catalina M.D.   On: 11/03/2021 21:13   DG Abd Portable 1V  Result Date: 11/05/2021 CLINICAL DATA:  Status post laparotomy. EXAM: PORTABLE ABDOMEN - 1 VIEW COMPARISON:  None. FINDINGS: Enteric tube with the tip projecting over the antrum of the stomach. No bowel dilatation to suggest obstruction. No evidence of pneumoperitoneum, portal venous gas or pneumatosis. No pathologic calcifications along the expected course of the ureters. Lap pad projecting over the right upper quadrant with 2 metallic needles. No acute osseous abnormality. IMPRESSION: Enteric tube with the tip projecting over the antrum of the stomach. Lap pad projecting over  the right upper quadrant with 2 metallic needles. Electronically Signed   By: Kathreen Devoid M.D.   On: 11/05/2021 13:12   CT ANGIO CHEST AORTA W/CM & OR WO/CM  Result Date: 10/31/2021 CLINICAL DATA:  Aortic valve replacement (TAVR), pre-op eval. Critical aortic valve stenosis. History of syncope and collapse. EXAM: CT ANGIOGRAPHY CHEST, ABDOMEN AND PELVIS TECHNIQUE: Multidetector CT imaging through the chest, abdomen and pelvis was performed using the standard protocol during bolus administration of intravenous contrast. Multiplanar reconstructed images and MIPs were obtained and reviewed to evaluate the vascular anatomy. RADIATION DOSE REDUCTION: This exam was performed according to the departmental dose-optimization program which includes automated exposure control, adjustment of the mA and/or kV according to patient size and/or use of iterative reconstruction technique. CONTRAST:  152mL OMNIPAQUE IOHEXOL 350 MG/ML SOLN COMPARISON:  None. FINDINGS: CTA CHEST FINDINGS Cardiovascular: Top-normal heart size. Diffusely thickened and coarsely calcified aortic valve. No significant pericardial effusion/thickening. Three-vessel coronary atherosclerosis. Atherosclerotic nonaneurysmal thoracic aorta. Normal caliber pulmonary arteries. No central pulmonary emboli. Mediastinum/Nodes: No discrete thyroid nodules. Unremarkable esophagus. No pathologically  enlarged axillary, mediastinal or hilar lymph nodes. Lungs/Pleura: No pneumothorax. No pleural effusion. Indistinct subsolid 1.8 cm peripheral right upper lobe nodule with tiny 0.3 cm solid component (series 7/image 34). Posterior right upper lobe 0.9 cm ground-glass pulmonary nodule (series 7/image 48). Anterior rate upper lobe tiny solid 0.3 cm pulmonary nodule (series 7/image 46). No acute consolidative airspace disease, lung masses or additional significant pulmonary nodules. Musculoskeletal: No aggressive appearing focal osseous lesions. Moderate thoracic spondylosis.  CTA ABDOMEN AND PELVIS FINDINGS Hepatobiliary: Normal liver with no liver mass. Cholelithiasis. No biliary ductal dilatation. Pancreas: Normal, with no mass or duct dilation. Spleen: Normal size. No mass. Adrenals/Urinary Tract: Normal adrenals. No contour deforming renal masses. No hydronephrosis. Nondistended bladder, grossly normal. Stomach/Bowel: Small hiatal hernia. Otherwise normal nondistended stomach. Normal caliber small bowel with no small bowel wall thickening. Normal appendix. Marked diffuse colonic diverticulosis, most prominent in the sigmoid colon, with no large bowel wall thickening or significant pericolonic fat stranding. Vascular/Lymphatic: Atherosclerotic nonaneurysmal abdominal aorta. No pathologically enlarged lymph nodes in the abdomen or pelvis. Reproductive: Mildly enlarged prostate. Other: No pneumoperitoneum, ascites or focal fluid collection. Musculoskeletal: No aggressive appearing focal osseous lesions. Marked lumbar degenerative disc disease. VASCULAR MEASUREMENTS PERTINENT TO TAVR: AORTA: Minimal Aortic Diameter-16.7 x 15.3 mm Severity of Aortic Calcification-moderate RIGHT PELVIS: Right Common Iliac Artery - Minimal Diameter-10.5 x 10.3 mm Tortuosity-mild Calcification-moderate Right External Iliac Artery - Minimal Diameter-7.5 x 7.4 mm Tortuosity-moderate Calcification-mild Right Common Femoral Artery - Minimal Diameter-7.7 x 4.3 mm Tortuosity-mild Calcification-marked LEFT PELVIS: Left Common Iliac Artery - Minimal Diameter-11.2 x 10.0 mm Tortuosity-moderate Calcification-moderate Left External Iliac Artery - Minimal Diameter-7.9 x 7.7 mm Tortuosity-moderate Calcification-mild Left Common Femoral Artery - Minimal Diameter-9.3 x 7.6 mm Tortuosity-mild Calcification-moderate Review of the MIP images confirms the above findings. IMPRESSION: 1. Vascular findings and measurements pertinent to potential TAVR, as detailed. 2. Diffusely thickened and coarsely calcified aortic valve,  compatible with the reported history of critical aortic stenosis. 3. Multiple right upper lobe pulmonary nodules, the largest an indistinct 1.8 cm peripheral right upper lobe subsolid pulmonary nodule with tiny 0.3 cm solid component. Non-contrast chest CT at 3-6 months is recommended. If nodules persist, subsequent management will be based upon the most suspicious nodule(s). This recommendation follows the consensus statement: Guidelines for Management of Incidental Pulmonary Nodules Detected on CT Images: From the Fleischner Society 2017; Radiology 2017; 284:228-243. 4. Three-vessel coronary atherosclerosis. 5. Small hiatal hernia. 6. Marked diffuse colonic diverticulosis. 7. Mildly enlarged prostate. 8. Cholelithiasis. 9. Aortic Atherosclerosis (ICD10-I70.0). Electronically Signed   By: Ilona Sorrel M.D.   On: 10/31/2021 16:07   ECHO INTRAOPERATIVE TEE  Result Date: 11/05/2021  *INTRAOPERATIVE TRANSESOPHAGEAL REPORT *  Patient Name:   Joshua [name] Date of Exam: 11/04/2021 Medical Rec #:  [id number]        Height:       71.5 in Accession #:    [id number]       Weight:       181.5 lb Date of Birth:  [birth year]       BSA:          2.03 m Patient Age:    62 years         BP:           109/76 mmHg Patient Gender: M                HR:           80 bpm. Exam Location:  Anesthesiology Transesophogeal exam  was perform intraoperatively during surgical procedure. Patient was closely monitored under general anesthesia during the entirety of examination. Indications:     Aortic Stenosis i35.0, CAD Native Vessel i25.10 Sonographer:     Raquel Sarna Senior RDCS Performing Phys: [id number] Lucile Crater LIGHTFOOT Diagnosing Phys: Hoy Morn MD Complications: No known complications during this procedure. POST-OP IMPRESSIONS _ Left Ventricle: has normal systolic function, with an ejection fraction of 60%. The cavity size was decreased. The wall motion is normal. _ Right Ventricle: The right ventricle appears unchanged from pre-bypass.  _ Aorta: The aorta appears unchanged from pre-bypass. _ Left Atrial Appendage: The left atrial appendage appears unchanged from pre-bypass. _ Aortic Valve: No stenosis present. No regurgitation post repair. Perivalvular leak noted.s/p 54mm Inspiris valve with small PVL noted and discussed with surgeon. _ Mitral Valve: The mitral valve appears unchanged from pre-bypass. _ Tricuspid Valve: The tricuspid valve appears unchanged from pre-bypass. _ Pulmonic Valve: The pulmonic valve appears unchanged from pre-bypass. _ Interatrial Septum: The interatrial septum appears unchanged from pre-bypass. _ Pericardium: The pericardium appears unchanged from pre-bypass. PRE-OP FINDINGS  Left Ventricle: The left ventricle has normal systolic function, with an ejection fraction of 60-65%. The cavity size was normal. There is mild concentric left ventricular hypertrophy. Right Ventricle: The right ventricle has normal systolic function. The cavity was normal. There is no increase in right ventricular wall thickness. Left Atrium: Left atrial size was dilated. No left atrial/left atrial appendage thrombus was detected. Right Atrium: Right atrial size was normal in size. Interatrial Septum: No atrial level shunt detected by color flow Doppler. Pericardium: There is no evidence of pericardial effusion. Mitral Valve: The mitral valve is normal in structure. Mitral valve regurgitation is trivial by color flow Doppler. Tricuspid Valve: The tricuspid valve was normal in structure. Tricuspid valve regurgitation was not visualized by color flow Doppler. Aortic Valve: The aortic valve is tricuspid Aortic valve regurgitation is mild by color flow Doppler. There is severe stenosis of the aortic valve. Pulmonic Valve: The pulmonic valve was normal in structure. Pulmonic valve regurgitation is not visualized by color flow Doppler. Aorta: The aortic root, ascending aorta and aortic arch are normal in size and structure. There is evidence of plaque  in the descending aorta. Pulmonary Artery: The pulmonary artery is of normal size. +--------------+--------++  LEFT VENTRICLE            +--------------+--------++  PLAX 2D                   +--------------+--------++  LVOT diam:     2.40 cm    +--------------+--------++  LVOT Area:     4.52 cm   +--------------+--------++                            +--------------+--------++  +--------------+-------+  SHUNTS                  +--------------+-------+  Systemic Diam: 2.40 cm  +--------------+-------+  Hoy Morn MD Electronically signed by Hoy Morn MD Signature Date/Time: 11/05/2021/3:46:21 PM    Final    VAS US DOPPLER PRE CABG  Result Date: 11/04/2021 PREOPERATIVE VASCULAR EVALUATION Patient Name:  Joshua [name]  Date of Exam:   11/03/2021 Medical Rec #: [id number]         Accession #:    [id number] Date of Birth: [birth year]        Patient Gender: M Patient Age:   56 years Exam Location:  [facility] Procedure:      VAS US DOPPLER PRE CABG Referring Phys: BRYAN BARTLE --------------------------------------------------------------------------------  Indications:  Pre-CABG. Risk Factors: Hypertension, hyperlipidemia. Performing Technologist: Archie Patten RVS  Examination Guidelines: A complete evaluation includes B-mode imaging, spectral Doppler, color Doppler, and power Doppler as needed of all accessible portions of each vessel. Bilateral testing is considered an integral part of a complete examination. Limited examinations for reoccurring indications may be performed as noted.  Right Carotid Findings: +----------+--------+--------+--------+-------------------------+---------+             PSV cm/s EDV cm/s Stenosis Describe                  Comments   +----------+--------+--------+--------+-------------------------+---------+  CCA Prox   95       21                heterogenous                         +----------+--------+--------+--------+-------------------------+---------+  CCA Distal 63        14                heterogenous and calcific            +----------+--------+--------+--------+-------------------------+---------+  ICA Prox   82       27       1-39%    heterogenous              Shadowing  +----------+--------+--------+--------+-------------------------+---------+  ICA Distal 96       27                                                     +----------+--------+--------+--------+-------------------------+---------+  ECA        75       7                                                      +----------+--------+--------+--------+-------------------------+---------+ +----------+--------+-------+--------+------------+             PSV cm/s EDV cms Describe Arm Pressure  +----------+--------+-------+--------+------------+  Subclavian 94                                      +----------+--------+-------+--------+------------+ +---------+--------+--+--------+-+---------+  Vertebral PSV cm/s 38 EDV cm/s 9 Antegrade  +---------+--------+--+--------+-+---------+ Left Carotid Findings: +----------+--------+--------+--------+------------+--------+             PSV cm/s EDV cm/s Stenosis Describe     Comments  +----------+--------+--------+--------+------------+--------+  CCA Prox   111      27                heterogenous           +----------+--------+--------+--------+------------+--------+  CCA Distal 70       17                heterogenous           +----------+--------+--------+--------+------------+--------+  ICA Prox   84       27       1-39%    heterogenous           +----------+--------+--------+--------+------------+--------+  ICA Distal 69       22                                       +----------+--------+--------+--------+------------+--------+  ECA        75       11                                       +----------+--------+--------+--------+------------+--------+ +----------+--------+--------+--------+------------+  Subclavian PSV cm/s EDV cm/s Describe Arm Pressure   +----------+--------+--------+--------+------------+             115                                      +----------+--------+--------+--------+------------+ +---------+--------+--+--------+--+---------+  Vertebral PSV cm/s 39 EDV cm/s 11 Antegrade  +---------+--------+--+--------+--+---------+  ABI Findings: +---------+------------------+-----+----------+--------+  Right     Rt Pressure (mmHg) Index Waveform   Comment   +---------+------------------+-----+----------+--------+  Brachial  148                      triphasic            +---------+------------------+-----+----------+--------+  PTA       255                1.72  monophasic           +---------+------------------+-----+----------+--------+  DP        147                0.99  monophasic           +---------+------------------+-----+----------+--------+  Great Toe 88                 0.59                       +---------+------------------+-----+----------+--------+ +---------+------------------+-----+---------+-------+  Left      Lt Pressure (mmHg) Index Waveform  Comment  +---------+------------------+-----+---------+-------+  Brachial  131                      triphasic          +---------+------------------+-----+---------+-------+  PTA       171                1.16  biphasic           +---------+------------------+-----+---------+-------+  DP        123                0.83  biphasic           +---------+------------------+-----+---------+-------+  Great Toe 100                0.68                     +---------+------------------+-----+---------+-------+ +-------+---------------+----------------+  ABI/TBI Today's ABI/TBI Previous ABI/TBI  +-------+---------------+----------------+  Right   1.72            0.59              +-------+---------------+----------------+  Left    1.16            0.68              +-------+---------------+----------------+  Right Doppler Findings: +--------+--------+-----+---------+--------+  Site     Pressure Index Doppler    Comments  +--------+--------+-----+---------+--------+  Brachial 148            triphasic           +--------+--------+-----+---------+--------+  Radial                  triphasic           +--------+--------+-----+---------+--------+  Ulnar                   triphasic           +--------+--------+-----+---------+--------+  Left Doppler Findings: +--------+--------+-----+---------+--------+  Site     Pressure Index Doppler   Comments  +--------+--------+-----+---------+--------+  Brachial 131            triphasic           +--------+--------+-----+---------+--------+  Radial                  triphasic           +--------+--------+-----+---------+--------+  Ulnar                   triphasic           +--------+--------+-----+---------+--------+  Summary: Right Carotid: Velocities in the right ICA are consistent with a 1-39% stenosis. Left Carotid: Velocities in the left ICA are consistent with a 1-39% stenosis. Vertebrals: Bilateral vertebral arteries demonstrate antegrade flow. Right ABI: Resting right ankle-brachial index indicates noncompressible right lower extremity arteries. The right toe-brachial index is abnormal. Left ABI: Resting left ankle-brachial index is within normal range. No evidence of significant left lower extremity arterial disease. The left toe-brachial index is abnormal. Right Upper Extremity: Unable to obtain allens test due to TR band. Left Upper Extremity: Doppler waveform obliterate with left radial compression. Doppler waveforms remain within normal limits with left ulnar compression.  Electronically signed by Deitra Mayo MD on 11/04/2021 at 3:31:06 PM.    Final    CT ANGIO ABDOMEN PELVIS  W &/OR WO CONTRAST  Result Date: 10/31/2021 CLINICAL DATA:  Aortic valve replacement (TAVR), pre-op eval. Critical aortic valve stenosis. History of syncope and collapse. EXAM: CT ANGIOGRAPHY CHEST, ABDOMEN AND PELVIS TECHNIQUE: Multidetector CT imaging through the chest, abdomen and pelvis  was performed using the standard protocol during bolus administration of intravenous contrast. Multiplanar reconstructed images and MIPs were obtained and reviewed to evaluate the vascular anatomy. RADIATION DOSE REDUCTION: This exam was performed according to the departmental dose-optimization program which includes automated exposure control, adjustment of the mA and/or kV according to patient size and/or use of iterative reconstruction technique. CONTRAST:  157mL OMNIPAQUE IOHEXOL 350 MG/ML SOLN COMPARISON:  None. FINDINGS: CTA CHEST FINDINGS Cardiovascular: Top-normal heart size. Diffusely thickened and coarsely calcified aortic valve. No significant pericardial effusion/thickening. Three-vessel coronary atherosclerosis. Atherosclerotic nonaneurysmal thoracic aorta. Normal caliber pulmonary arteries. No central pulmonary emboli. Mediastinum/Nodes: No discrete thyroid nodules. Unremarkable esophagus. No pathologically enlarged axillary, mediastinal or hilar lymph nodes. Lungs/Pleura: No pneumothorax. No pleural effusion. Indistinct subsolid 1.8 cm peripheral right upper lobe nodule with tiny 0.3 cm solid component (series 7/image 34). Posterior right upper lobe 0.9 cm ground-glass pulmonary nodule (series 7/image 48). Anterior rate upper lobe tiny solid 0.3 cm pulmonary nodule (series 7/image 46). No acute consolidative airspace disease, lung masses or additional significant pulmonary nodules. Musculoskeletal: No aggressive appearing focal osseous lesions. Moderate thoracic spondylosis. CTA ABDOMEN AND PELVIS FINDINGS Hepatobiliary: Normal liver with no liver mass. Cholelithiasis. No  biliary ductal dilatation. Pancreas: Normal, with no mass or duct dilation. Spleen: Normal size. No mass. Adrenals/Urinary Tract: Normal adrenals. No contour deforming renal masses. No hydronephrosis. Nondistended bladder, grossly normal. Stomach/Bowel: Small hiatal hernia. Otherwise normal nondistended stomach. Normal caliber small  bowel with no small bowel wall thickening. Normal appendix. Marked diffuse colonic diverticulosis, most prominent in the sigmoid colon, with no large bowel wall thickening or significant pericolonic fat stranding. Vascular/Lymphatic: Atherosclerotic nonaneurysmal abdominal aorta. No pathologically enlarged lymph nodes in the abdomen or pelvis. Reproductive: Mildly enlarged prostate. Other: No pneumoperitoneum, ascites or focal fluid collection. Musculoskeletal: No aggressive appearing focal osseous lesions. Marked lumbar degenerative disc disease. VASCULAR MEASUREMENTS PERTINENT TO TAVR: AORTA: Minimal Aortic Diameter-16.7 x 15.3 mm Severity of Aortic Calcification-moderate RIGHT PELVIS: Right Common Iliac Artery - Minimal Diameter-10.5 x 10.3 mm Tortuosity-mild Calcification-moderate Right External Iliac Artery - Minimal Diameter-7.5 x 7.4 mm Tortuosity-moderate Calcification-mild Right Common Femoral Artery - Minimal Diameter-7.7 x 4.3 mm Tortuosity-mild Calcification-marked LEFT PELVIS: Left Common Iliac Artery - Minimal Diameter-11.2 x 10.0 mm Tortuosity-moderate Calcification-moderate Left External Iliac Artery - Minimal Diameter-7.9 x 7.7 mm Tortuosity-moderate Calcification-mild Left Common Femoral Artery - Minimal Diameter-9.3 x 7.6 mm Tortuosity-mild Calcification-moderate Review of the MIP images confirms the above findings. IMPRESSION: 1. Vascular findings and measurements pertinent to potential TAVR, as detailed. 2. Diffusely thickened and coarsely calcified aortic valve, compatible with the reported history of critical aortic stenosis. 3. Multiple right upper lobe pulmonary nodules, the largest an indistinct 1.8 cm peripheral right upper lobe subsolid pulmonary nodule with tiny 0.3 cm solid component. Non-contrast chest CT at 3-6 months is recommended. If nodules persist, subsequent management will be based upon the most suspicious nodule(s). This recommendation follows the consensus statement:  Guidelines for Management of Incidental Pulmonary Nodules Detected on CT Images: From the Fleischner Society 2017; Radiology 2017; 284:228-243. 4. Three-vessel coronary atherosclerosis. 5. Small hiatal hernia. 6. Marked diffuse colonic diverticulosis. 7. Mildly enlarged prostate. 8. Cholelithiasis. 9. Aortic Atherosclerosis (ICD10-I70.0). Electronically Signed   By: Ilona Sorrel M.D.   On: 10/31/2021 16:07    Assessment/Plan  1. Other specified hypotension -Decrease lisinopril 10 mg to 5 mg daily, hold for SBP <= 105 -   Decrease metoprolol 25 mg twice daily to 1/2 tab = 12.5 mg twice daily and hold for HR <60 and SBP <= 105 -    Monitor BPs  2. 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  Labs/tests ordered:    CBC    Durenda Age, DNP, MSN, FNP-BC [facility] [phone] (Monday-Friday 8:00 a.m. - 5:00 p.m.) [phone] (after hours)

## 2021-12-04 ENCOUNTER — Encounter: Payer: Self-pay | Admitting: Adult Health

## 2021-12-04 ENCOUNTER — Non-Acute Institutional Stay (SKILLED_NURSING_FACILITY): Payer: Medicare HMO | Admitting: Adult Health

## 2021-12-04 DIAGNOSIS — I9589 Other hypotension: Secondary | ICD-10-CM

## 2021-12-04 DIAGNOSIS — D62 Acute posthemorrhagic anemia: Secondary | ICD-10-CM | POA: Diagnosis not present

## 2021-12-04 NOTE — Progress Notes (Signed)
Location:  [facility] Room Number: 024-O Place of Service:  SNF (31) Provider:  Durenda Age, DNP, FNP-BC  Patient Care Team: Shelda Pal, DO as PCP - General (Family Medicine) Vickie Epley, MD as PCP - Electrophysiology (Cardiology) Croitoru, Dani Gobble, MD as PCP - Cardiology (Cardiology)  Extended Emergency Contact Information Primary Emergency Contact: [place] of [place] Phone: [phone] Mobile Phone: [phone] Relation: Son Secondary Emergency Contact: Kibby,[name] Mobile Phone: [phone] Relation: Son Preferred language: English Interpreter needed? No  Code Status:  Full Code  Goals of care: Advanced Directive information Advanced Directives 12/04/2021  Does Patient Have a Medical Advance Directive? Yes  Type of Paramedic of [place];Living will  Does patient want to make changes to medical advance directive? No - Patient declined  Copy of [place] in Chart? Yes - validated most recent copy scanned in chart (See row information)  Would patient like information on creating a medical advance directive? -     Chief Complaint  Patient presents with   Acute Visit    Low blood pressure    HPI:  Pt is a 86 y.o. male seen today for an acute visit regarding low BPs. SBPs ranging from 94 to 103, with outlier 125. He takes Lisinopril 5 mg daily and Metoprolol 12.5 mg BID. HRs ranging from 74 to 95. He denies dizziness nor headache. Latest hgb 11.4, increased from 9.8.  He was admitted to [facility] on 11/21/21 post hospital admission 11/03/2021 to 11/21/21.  He had severe aortic stenosis for which he had CABG x3 on 11/04/2021 and S/P aortic valve replacement with bioprosthetic valve.  He had complete heart block and had pacemaker placement on 11/14/2021.  He had acute blood loss for which she had transfusion of 5 units PRBC.  He had  thrombocytopenia for which he had transfusion of 5 units FFP and 3 platelets.   Past Medical History:  Diagnosis Date   Arthritis    Cataract    Esophageal stricture    GERD (gastroesophageal reflux disease)    Hyperlipidemia    Hypertension    Liver disease    hepatitis   Severe aortic stenosis    Past Surgical History:  Procedure Laterality Date   AORTIC VALVE REPLACEMENT N/A 11/04/2021   Procedure: AORTIC VALVE REPLACEMENT (AVR) USING 23 MM INSPIRIS RESILIA  AORTIC VALVE;  Surgeon: Lajuana Matte, MD;  Location: [facility];  Service: Open Heart Surgery;  Laterality: N/A;   cataract  2013   CORONARY ARTERY BYPASS GRAFT N/A 11/04/2021   Procedure: CORONARY ARTERY BYPASS GRAFTING (CABG) X 3, USING LEFT INTERNAL MAMMARY ARTERY AND ENDOSCOPICALLY HARVESTED RIGHT GREATER SAPHENOUS VEIN. LIMA TO LAD, SVG TO PDA, SVG TO OM;  Surgeon: Lajuana Matte, MD;  Location: [facility];  Service: Open Heart Surgery;  Laterality: N/A;   ENDOVEIN HARVEST OF GREATER SAPHENOUS VEIN Right 11/04/2021   Procedure: ENDOVEIN HARVEST OF GREATER SAPHENOUS VEIN;  Surgeon: Lajuana Matte, MD;  Location: [facility];  Service: Open Heart Surgery;  Laterality: Right;   EXPLORATION POST OPERATIVE OPEN HEART N/A 11/05/2021   Procedure: EXPLORATION POST OPERATIVE OPEN HEARTFOR BLEEDING;  Surgeon: Melrose Nakayama, MD;  Location: [facility];  Service: Open Heart Surgery;  Laterality: N/A;   HEMORROIDECTOMY  2001   HERNIA REPAIR  2005   INTRAVASCULAR PRESSURE WIRE/FFR STUDY N/A 11/03/2021   Procedure: INTRAVASCULAR PRESSURE WIRE/FFR STUDY;  Surgeon: Burnell Blanks, MD;  Location: [facility];  Service: Cardiovascular;  Laterality: N/A;   PACEMAKER IMPLANT N/A 11/09/2021   Procedure: PACEMAKER IMPLANT;  Surgeon: Vickie Epley, MD;  Location: [facility];  Service: Cardiovascular;  Laterality: N/A;   RIGHT/LEFT HEART CATH AND CORONARY ANGIOGRAPHY N/A 11/03/2021   Procedure: RIGHT/LEFT HEART CATH  AND CORONARY ANGIOGRAPHY;  Surgeon: Burnell Blanks, MD;  Location: [facility];  Service: Cardiovascular;  Laterality: N/A;   TEE WITHOUT CARDIOVERSION N/A 11/04/2021   Procedure: TRANSESOPHAGEAL ECHOCARDIOGRAM (TEE);  Surgeon: Lajuana Matte, MD;  Location: [facility];  Service: Open Heart Surgery;  Laterality: N/A;   TEMPORARY PACEMAKER N/A 11/07/2021   Procedure: TEMPORARY PACEMAKER;  Surgeon: [name], MD;  Location: [facility];  Service: Cardiovascular;  Laterality: N/A;   TONSILLECTOMY  1948    No Known Allergies  Outpatient Encounter Medications as of 12/04/2021  Medication Sig   acetaminophen (TYLENOL) 325 MG tablet Take 1-2 tablets (325-650 mg total) by mouth every 4 (four) hours as needed for mild pain.   aspirin EC 81 MG tablet Take 1 tablet (81 mg total) by mouth daily. Swallow whole.   atorvastatin (LIPITOR) 40 MG tablet Take 1 tablet (40 mg total) by mouth daily.   bisacodyl (DULCOLAX) 10 MG suppository If not relieved by MOM, give 10 mg Bisacodyl suppositiory rectally X 1 dose in 24 hours as needed   diclofenac (VOLTAREN) 50 MG EC tablet Take 50 mg by mouth 2 (two) times daily.   diclofenac Sodium (VOLTAREN) 1 % GEL APPLY TOPICALLY TO LEFT KNEE TWICE A DAY FOR PAIN   ketoconazole (NIZORAL) 2 % shampoo Apply 1 application topically once a week.   Magnesium Hydroxide (MILK OF MAGNESIA PO) If no BM in 3 days, give 30 cc Milk of Magnesium p.o. x 1 dose in 24 hours as needed (Do not use standing constipation orders for residents with renal failure CFR less than 30. Contact MD for orders) (Physician Order)   melatonin 5 MG TABS Take 5 mg by mouth.   metoprolol tartrate (LOPRESSOR) 25 MG tablet Take 12.5 mg by mouth 2 (two) times daily. **Hold for HR less than 60, SBP less than or equal to 105**   Multiple Vitamin (MULTIVITAMIN) tablet Take 1 tablet by mouth daily.   omeprazole (PRILOSEC) 40 MG capsule TAKE 1 CAPSULE EVERY DAY   Sodium Phosphates (RA  SALINE ENEMA RE) If not relieved by Biscodyl suppository, give disposable Saline Enema rectally X 1 dose/24 hrs as needed   traMADol (ULTRAM) 50 MG tablet Take 1 tablet (50 mg total) by mouth every 6 (six) hours as needed for moderate pain.   vitamin B-12 (CYANOCOBALAMIN) 1000 MCG tablet Take 1,000 mcg by mouth daily.   [DISCONTINUED] Glucosamine-Chondroit-Vit C-Mn (GLUCOSAMINE 1500 COMPLEX PO) Take 2 tablets by mouth every other day.   [DISCONTINUED] lisinopril (ZESTRIL) 5 MG tablet Take 5 mg by mouth daily. **Hold for SBP less than or equal to 105**   No facility-administered encounter medications on file as of 12/04/2021.    Review of Systems  Constitutional:  Negative for activity change, appetite change and fever.  HENT:  Negative for sore throat.   Eyes: Negative.   Cardiovascular:  Negative for chest pain and leg swelling.  Gastrointestinal:  Negative for abdominal distention, diarrhea and vomiting.  Genitourinary:  Negative for dysuria, frequency and urgency.  Skin:  Negative for color change.  Neurological:  Negative for dizziness and headaches.  Psychiatric/Behavioral:  Negative for  behavioral problems and sleep disturbance. The patient is not nervous/anxious.       Immunization History  Administered Date(s) Administered   Influenza, High Dose Seasonal PF 07/19/2017, 06/19/2020   Influenza-Unspecified 07/20/2015, 07/10/2021   PFIZER Comirnaty(Gray Top)Covid-19 Tri-Sucrose Vaccine 01/23/2021   PFIZER(Purple Top)SARS-COV-2 Vaccination 12/03/2019, 12/25/2019, 07/24/2020   PNEUMOCOCCAL CONJUGATE-20 10/24/2021   Pfizer Covid-19 Vaccine Bivalent Booster 26yrs & up 07/10/2021   Pneumococcal Polysaccharide-23 04/22/2018   Tdap 04/22/2018   Pertinent  Health Maintenance Due  Topic Date Due   INFLUENZA VACCINE  Completed   Fall Risk 11/19/2021 11/19/2021 11/20/2021 11/20/2021 11/21/2021  Falls in the past year? - - - - -  Was there an injury with Fall? - - - - -  Fall Risk Category  Calculator - - - - -  Fall Risk Category - - - - -  Patient Fall Risk Level Moderate fall risk Moderate fall risk Moderate fall risk Moderate fall risk Moderate fall risk  Patient at Risk for Falls Due to - - - - -  Fall risk Follow up - - - - -     Vitals:   12/04/21 0941  BP: (!) 94/51  Pulse: 80  Resp: 18  Temp: (!) 97.5 F (36.4 C)  SpO2: 94%  Weight: 168 lb 6.4 oz (76.4 kg)  Height: 5\' 11"  (1.803 m)   Body mass index is 23.49 kg/m.  Physical Exam Constitutional:      Appearance: Normal appearance.  HENT:     Head: Normocephalic and atraumatic.     Mouth/Throat:     Mouth: Mucous membranes are moist.  Eyes:     Conjunctiva/sclera: Conjunctivae normal.  Cardiovascular:     Rate and Rhythm: Normal rate and regular rhythm.     Pulses: Normal pulses.     Heart sounds: Normal heart sounds.  Pulmonary:     Effort: Pulmonary effort is normal.     Breath sounds: Normal breath sounds.  Abdominal:     General: Bowel sounds are normal.     Palpations: Abdomen is soft.  Musculoskeletal:        General: No swelling. Normal range of motion.     Cervical back: Normal range of motion.  Skin:    General: Skin is warm and dry.  Neurological:     General: No focal deficit present.     Mental Status: He is alert and oriented to person, place, and time.  Psychiatric:        Mood and Affect: Mood normal.        Behavior: Behavior normal.        Thought Content: Thought content normal.        Judgment: Judgment normal.       Labs reviewed: Recent Labs    11/05/21 1649 11/06/21 0312 11/06/21 0716 11/11/21 0104 11/12/21 0053 11/13/21 0148 11/26/21 0000  NA 140 141   < > 130* 129* 132* 132*  K 3.9 3.7   < > 4.0 3.6 4.3 4.9  CL 103 107   < > 96* 94* 96* 97*  CO2 24 26   < > 24 24 27  26*  GLUCOSE 134* 136*   < > 112* 107* 111*  --   BUN 11 12   < > 14 14 16 18   CREATININE 1.30* 1.38*   < > 0.98 0.94 1.03 1.0  CALCIUM 7.3* 7.1*   < > 8.4* 8.5* 8.5* 9.4  MG 2.1 2.2   --   --  1.6*  --   --   PHOS  --   --   --   --  3.5  --   --    < > = values in this interval not displayed.   Recent Labs    04/09/21 1033 10/24/21 1531 11/03/21 2003  AST 33 50* 36  ALT 17 26 24   ALKPHOS 58  --  55  BILITOT 1.1 1.2 1.7*  PROT 7.1 7.1 6.3*  ALBUMIN 4.5  --  3.5   Recent Labs    08/01/21 1626 09/25/21 1133 11/11/21 0104 11/12/21 0053 11/13/21 0148 11/26/21 0000  WBC 6.1   < > 11.2* 10.7* 10.0 8.7  NEUTROABS 3,752  --   --   --   --   --   HGB 13.9   < > 9.8* 10.5* 9.8* 11.4*  HCT 39.6   < > 28.8* 30.8* 29.6* 33*  MCV 98.5   < > 87.0 87.3 87.8  --   PLT 126*   < > 126* 142* 181 238   < > = values in this interval not displayed.   Lab Results  Component Value Date   TSH 1.150 09/25/2021   Lab Results  Component Value Date   HGBA1C 5.3 11/03/2021   Lab Results  Component Value Date   CHOL 195 10/24/2021   HDL 101 10/24/2021   LDLCALC 78 10/24/2021   TRIG 76 10/24/2021   CHOLHDL 1.9 10/24/2021    Significant Diagnostic Results in last 30 days:  DG Chest 2 View  Result Date: 11/14/2021 CLINICAL DATA:  Chest tube removal EXAM: CHEST - 2 VIEW COMPARISON:  11/13/2021 FINDINGS: Interval removal of left basilar chest tube. Tiny left apical pneumothorax (less than 5% volume). No right-sided pneumothorax. Left chest wall cardiac device remains in place. Post CABG changes. Stable heart size. No abnormal shift of the heart or mediastinal structures. Mild left basilar atelectasis. Small bilateral pleural effusions. IMPRESSION: Interval removal of left basilar chest tube with tiny left apical pneumothorax. These results will be called to the ordering clinician or representative by the Radiologist Assistant, and communication documented in the PACS or [employer]. Electronically Signed   By: Davina Poke D.O.   On: 11/14/2021 08:52   CARDIAC CATHETERIZATION  Result Date: 11/07/2021 1.  Successful temporary pacemaker placement with threshold of 0.4  mA, rate of 60 bpm, and output of 15 mA; epicardial pacing was resumed at 80 bpm with the output of 15 mA.   EP PPM/ICD IMPLANT  Result Date: 11/09/2021  CONCLUSIONS:  1. Symptomatic complete heart block  2. Successful dual chamber permanent pacemaker implantation  3.  No early apparent complications.   DG Chest 1V REPEAT Same Day  Result Date: 11/10/2021 CLINICAL DATA:  86 year old male with history of bilateral chest tube placement. EXAM: CHEST - 1 VIEW SAME DAY COMPARISON:  Chest x-ray 11/10/2021. FINDINGS: Bilateral chest tubes are now in position. Left pigtail reformed over the lower left hemithorax. Right pigtail partially reformed projecting over the mid to upper right hemithorax. Previously noted left-sided pneumothorax persists, still moderate in size. Previously noted right-sided pneumothorax also persists, small to moderate in size. Bibasilar opacities are noted which may reflect areas of atelectasis and/or consolidation. Blunting of the costophrenic sulci bilaterally is also noted, suggesting the presence of pleural fluid bilaterally. No evidence of pulmonary edema. Heart size is normal. Upper mediastinal contours are within normal limits. A small amount of pneumomediastinum is also evident. Atherosclerotic calcifications are noted in the  thoracic aorta. Status post median sternotomy for CABG and aortic valve replacement. Left-sided pacemaker device in place with lead tips projecting over the expected location of the right atrium and right ventricle. IMPRESSION: 1. Status post placement of small bore bilateral chest tubes with persistent bilateral hydropneumothoraces, with moderate pneumothorax components bilaterally (these have decreased, but not yet resolved). Electronically Signed   By: Vinnie Langton M.D.   On: 11/10/2021 06:57   DG CHEST PORT 1 VIEW  Result Date: 11/13/2021 CLINICAL DATA:  Chest tube EXAM: PORTABLE CHEST 1 VIEW COMPARISON:  Chest x-ray dated November 12, 2021 FINDINGS:  Interval removal right-sided chest tube. No evidence pneumothorax. No visible left pneumothorax. Left-sided chest tube in place. Cardiac and mediastinal contours are unchanged post median sternotomy. Left chest wall dual lead pacer with unchanged lead position. Mild left basilar opacity, likely atelectasis. Probable small bilateral pleural effusions. IMPRESSION: Interval removal right-sided chest tube. No evidence of pneumothorax. Electronically Signed   By: Yetta Glassman M.D.   On: 11/13/2021 09:47   DG CHEST PORT 1 VIEW  Result Date: 11/12/2021 CLINICAL DATA:  Post sternotomy. EXAM: PORTABLE CHEST 1 VIEW COMPARISON:  Chest x-ray dated November 11, 2021 FINDINGS: Cardiac and mediastinal contours are unchanged post median sternotomy. Left chest wall dual lead pacer with unchanged lead position. Mild bibasilar opacities which are likely due to atelectasis. Trace left pneumothorax is stable to slightly decreased in size when compared with prior exam. Bilateral chest tubes in place. IMPRESSION: Trace left pneumothorax is stable to slightly decreased in size when compared with prior exam. Bilateral chest tubes in place. Electronically Signed   By: Yetta Glassman M.D.   On: 11/12/2021 08:37   DG CHEST PORT 1 VIEW  Result Date: 11/11/2021 CLINICAL DATA:  Follow up pneumothorax. History of aortic valve replacement one week ago. EXAM: PORTABLE CHEST 1 VIEW COMPARISON:  Radiographs 11/10/2021.  CT 10/31/2021. FINDINGS: 0508 hours. Bilateral pigtail chest tubes remain in place with better formation of the right-sided catheter. No residual right-sided pneumothorax identified. There is a minimal residual left-sided pneumothorax. The heart size and mediastinal contours are stable status post CABG and aortic valve replacement. There are persistent small bilateral pleural effusions and mild bibasilar atelectasis. Left subclavian pacemaker leads appear unchanged. Skin staples are in place. IMPRESSION: Interval  resolution of previously demonstrated right pneumothorax with improvement in the left-sided pneumothorax. No other significant changes. Electronically Signed   By: Richardean Sale M.D.   On: 11/11/2021 08:08   DG CHEST PORT 1 VIEW  Addendum Date: 11/10/2021   ADDENDUM REPORT: 11/10/2021 04:45 ADDENDUM: Critical findings were reported to Dr. Roxan Hockey at 443 a.m. Electronically Signed   By: Brett Fairy M.D.   On: 11/10/2021 04:45   Result Date: 11/10/2021 CLINICAL DATA:  Encounter for wheezing. EXAM: PORTABLE CHEST 1 VIEW COMPARISON:  11/08/2021. FINDINGS: The heart is enlarged and there is evidence of prior cardiothoracic surgery. A prosthetic cardiac valve is noted. Sternotomy wires are present over the midline. A left pacemaker is noted. There is a moderate pneumothorax on the left a large pneumothorax on the right with atelectasis and partial lung collapse. No definite effusion. No acute osseous abnormality. IMPRESSION: 1. Large pneumothorax on the right and moderate pneumothorax on the left with bilateral atelectasis and partial lung collapse. 2. Cardiomegaly. Electronically Signed: By: Brett Fairy M.D. On: 11/10/2021 04:38   DG CHEST PORT 1 VIEW  Result Date: 11/08/2021 CLINICAL DATA:  Chest pain and shortness of breath. Status post CABG procedure. EXAM:  PORTABLE CHEST 1 VIEW COMPARISON:  11/07/2021 FINDINGS: Status post CABG procedure right internal jugular pacer lead is identified terminating in the right ventricle. Mediastinal drain and left chest tube are stable in position. No pneumothorax visualized. Persistent small right pleural effusion. No interstitial edema or airspace consolidation. IMPRESSION: 1. Stable chest tube and mediastinal drain. No pneumothorax visualized. 2. Persistent small right pleural effusion. Electronically Signed   By: Kerby Moors M.D.   On: 11/08/2021 10:07   DG CHEST PORT 1 VIEW  Result Date: 11/07/2021 CLINICAL DATA:  Status post coronary bypass surgery and  aortic valve replacement EXAM: PORTABLE CHEST 1 VIEW COMPARISON:  Previous examinations including the study done earlier today. FINDINGS: There is interval placement of right IJ central venous catheter with its tip in the course of superior vena cava. There is interval removal of vascular sheath from right IJ. Metallic sutures are seen in the sternum. Skin staples are noted. No interval changes are noted in the left chest tube and mediastinal drains. There are linear densities in both lower lung fields suggesting subsegmental atelectasis with interval improvement. There are no signs of alveolar pulmonary edema or new focal infiltrates. There is no significant pleural effusion or pneumothorax. IMPRESSION: Tip of right IJ central venous catheter is seen in the course of superior vena cava. There is interval improvement in aeration of lower lung fields suggesting decrease in subsegmental atelectasis. Electronically Signed   By: Elmer Picker M.D.   On: 11/07/2021 13:47   DG Chest Port 1 View  Result Date: 11/07/2021 CLINICAL DATA:  Chest tube, sore chest, post AVR EXAM: PORTABLE CHEST 1 VIEW COMPARISON:  Portable exam 0510 hours compared to 11/06/2021 FINDINGS: Interval removal of endotracheal and nasogastric tubes. RIGHT jugular central venous catheter with tip projecting over confluence of the SVC. Mediastinal drains and LEFT thoracostomy tube present. Normal heart size post CABG and AVR. Bibasilar atelectasis and small RIGHT pleural effusion. Remaining lungs clear. No pneumothorax. IMPRESSION: Bibasilar atelectasis and small RIGHT pleural effusion post cardiac surgery. Electronically Signed   By: Lavonia Dana M.D.   On: 11/07/2021 08:48   DG CHEST PORT 1 VIEW  Result Date: 11/06/2021 CLINICAL DATA:  Intubated, status post open heart surgery EXAM: PORTABLE CHEST 1 VIEW COMPARISON:  Chest radiograph 11/05/2021 FINDINGS: The endotracheal tube tip is approximately 3.9 cm from the carina. The right IJ  vascular sheath terminates in the upper SVC. A left basilar chest tube and presumed right chest tube are stable. The enteric catheter again courses off the field of view. A tube is again seen terminating over the medial left hemithorax, likely reflecting a mediastinal drain as seen on the radiograph from 11/05/2021 at 11:56 a.m. Median sternotomy wires, mediastinal surgical clips, and an aortic valve prosthesis are again seen. Lung volumes are low. Probable trace right pleural effusions are again seen with hazy opacity over the right lower lobe likely reflecting atelectasis. Overall, aeration is not significantly changed. There is no appreciable pneumothorax. The bones are stable. IMPRESSION: 1. Support devices as above. 2. Probable trace bilateral pleural effusions, not significantly changed compared to the most recent prior study. No appreciable pneumothorax. Electronically Signed   By: Valetta Mole M.D.   On: 11/06/2021 09:05   DG CHEST PORT 1 VIEW  Result Date: 11/05/2021 CLINICAL DATA:  Bleeding EXAM: PORTABLE CHEST 1 VIEW COMPARISON:  X-ray abdomen 11/05/2021. chest x-ray 118 23:12 p.m. FINDINGS: Right chest wall internal jugular central venous catheter with tip overlying the expected region of the proximal  superior vena cava approximately 7 cm from the superior cavoatrial junction. Enteric tube coursing below the hemidiaphragm coursing below diaphragm with tip and side port collimated off view. Adjacent to on the left coursing cranially of unclear etiology. Query epicardial pacing wires noted along the right heart border. Left inferior approach chest tube is noted with tip overlying the region of the left costophrenic angle. Previously identified mediastinal drain not visualized. The heart and mediastinal contours are unchanged. Status post aortic valve repair. No focal consolidation. No pulmonary edema. Trace left pleural effusion. Possible trace right pleural effusion. No pneumothorax. No acute osseous  abnormality. IMPRESSION: 1. Right chest wall internal jugular central venous catheter with tip overlying the expected region of the proximal superior vena cava approximately 7 cm from the superior cavoatrial junction. 2. Enteric tube coursing below the hemidiaphragm coursing below diaphragm with tip and side port collimated off view. Adjacent to on the left coursing cranially of unclear etiology. Consider x-ray abdomen for further evaluation. 3. Trace left pleural effusion with inferior approach left chest tube noted. Electronically Signed   By: Iven Finn M.D.   On: 11/05/2021 18:55   DG CHEST PORT 1 VIEW  Result Date: 11/05/2021 CLINICAL DATA:  Encounter for orogastric tube placement. Status post exploration postoperative open heart for bleeding today. EXAM: PORTABLE CHEST 1 VIEW COMPARISON:  Chest radiograph dated November 05, 2021 at 5:08 a.m. FINDINGS: The heart is enlarged. Interval increase in size of the right pleural effusion/atelectasis. Low lung volumes. No appreciable pneumothorax. Endotracheal tube with distal tip approximately 3.6 cm above the carina. Feeding tube coursing below the diaphragm with distal tip not included. Right IJ access central line sheath is unchanged. Sternotomy wires are intact. IMPRESSION: 1. Endotracheal tube with distal tip projecting below the diaphragm and not included on the image. 2. Interval increase in bilateral pleural effusions, right greater than the left with bibasilar atelectasis. 3.  Lines and tubes as above. Electronically Signed   By: Keane Police D.O.   On: 11/05/2021 13:19   DG Chest Port 1 View  Result Date: 11/05/2021 CLINICAL DATA:  Status post open heart surgery. Endotracheal tube and chest tubes. EXAM: PORTABLE CHEST 1 VIEW COMPARISON:  Multiple prior chest radiographs including most recent dated November 04, 2021 FINDINGS: Endotracheal tube with distal tip approximately 3.5 cm above the carina, right IJ access central line, feeding tube and  mediastinal and left pleural drain are unchanged. The heart is enlarged with evidence of CABG and prosthetic aortic valve. No appreciable pneumothorax. Bilateral lower lobe hazy opacities concerning for pleural effusion, underlying airspace disease can not be excluded. No acute osseous abnormality. IMPRESSION: 1.  Lines and tubes as above and unchanged. 2.   Stable cardiomegaly with postsurgical changes. 3. Small bilateral pleural effusions, right greater than the left with right basilar atelectasis, underlying airspace disease can not be excluded. Electronically Signed   By: Keane Police D.O.   On: 11/05/2021 09:01   DG Chest Port 1 View  Result Date: 11/04/2021 CLINICAL DATA:  Status post CABG, status post AVR. EXAM: PORTABLE CHEST 1 VIEW COMPARISON:  Chest x-ray 11/03/2021. FINDINGS: Endotracheal tube tip is 3.8 cm above carina. Enteric tube extends below the diaphragm. Right-sided central venous catheter tip projects over the proximal SVC. There are new sternotomy wires, prosthetic heart valve and mediastinal clips. Mediastinal and left pleural drainage catheters are also new from prior study. Cardiomediastinal silhouette is enlarged compatible with recent cardiac surgery. Lung volumes are low. There is minimal left basilar  atelectasis. There is no pleural effusion or pneumothorax. No acute fractures are seen. IMPRESSION: 1. Low lung volumes with left basilar atelectasis. 2. Endotracheal tube tip 3.8 cm above carina. 3. Other lines and tubes/postsurgical changes as above. Electronically Signed   By: Ronney Asters M.D.   On: 11/04/2021 20:13   DG Abd Portable 1V  Result Date: 11/05/2021 CLINICAL DATA:  Status post laparotomy. EXAM: PORTABLE ABDOMEN - 1 VIEW COMPARISON:  None. FINDINGS: Enteric tube with the tip projecting over the antrum of the stomach. No bowel dilatation to suggest obstruction. No evidence of pneumoperitoneum, portal venous gas or pneumatosis. No pathologic calcifications along the  expected course of the ureters. Lap pad projecting over the right upper quadrant with 2 metallic needles. No acute osseous abnormality. IMPRESSION: Enteric tube with the tip projecting over the antrum of the stomach. Lap pad projecting over the right upper quadrant with 2 metallic needles. Electronically Signed   By: Kathreen Devoid M.D.   On: 11/05/2021 13:12   ECHO INTRAOPERATIVE TEE  Result Date: 11/05/2021  *INTRAOPERATIVE TRANSESOPHAGEAL REPORT *  Patient Name:   Joshua [name] Date of Exam: 11/04/2021 Medical Rec #:  [id number]        Height:       71.5 in Accession #:    [id number]       Weight:       181.5 lb Date of Birth:  07/14/1936       BSA:          2.03 m Patient Age:    86 years         BP:           109/76 mmHg Patient Gender: M                HR:           80 bpm. Exam Location:  Anesthesiology Transesophogeal exam was perform intraoperatively during surgical procedure. Patient was closely monitored under general anesthesia during the entirety of examination. Indications:     Aortic Stenosis i35.0, CAD Native Vessel i25.10 Sonographer:     Raquel Sarna Senior RDCS Performing Phys: [id number] Lucile Crater LIGHTFOOT Diagnosing Phys: Hoy Morn MD Complications: No known complications during this procedure. POST-OP IMPRESSIONS _ Left Ventricle: has normal systolic function, with an ejection fraction of 60%. The cavity size was decreased. The wall motion is normal. _ Right Ventricle: The right ventricle appears unchanged from pre-bypass. _ Aorta: The aorta appears unchanged from pre-bypass. _ Left Atrial Appendage: The left atrial appendage appears unchanged from pre-bypass. _ Aortic Valve: No stenosis present. No regurgitation post repair. Perivalvular leak noted.s/p 54mm Inspiris valve with small PVL noted and discussed with surgeon. _ Mitral Valve: The mitral valve appears unchanged from pre-bypass. _ Tricuspid Valve: The tricuspid valve appears unchanged from pre-bypass. _ Pulmonic Valve: The pulmonic valve  appears unchanged from pre-bypass. _ Interatrial Septum: The interatrial septum appears unchanged from pre-bypass. _ Pericardium: The pericardium appears unchanged from pre-bypass. PRE-OP FINDINGS  Left Ventricle: The left ventricle has normal systolic function, with an ejection fraction of 60-65%. The cavity size was normal. There is mild concentric left ventricular hypertrophy. Right Ventricle: The right ventricle has normal systolic function. The cavity was normal. There is no increase in right ventricular wall thickness. Left Atrium: Left atrial size was dilated. No left atrial/left atrial appendage thrombus was detected. Right Atrium: Right atrial size was normal in size. Interatrial Septum: No atrial level shunt detected by color flow Doppler. Pericardium: There  is no evidence of pericardial effusion. Mitral Valve: The mitral valve is normal in structure. Mitral valve regurgitation is trivial by color flow Doppler. Tricuspid Valve: The tricuspid valve was normal in structure. Tricuspid valve regurgitation was not visualized by color flow Doppler. Aortic Valve: The aortic valve is tricuspid Aortic valve regurgitation is mild by color flow Doppler. There is severe stenosis of the aortic valve. Pulmonic Valve: The pulmonic valve was normal in structure. Pulmonic valve regurgitation is not visualized by color flow Doppler. Aorta: The aortic root, ascending aorta and aortic arch are normal in size and structure. There is evidence of plaque in the descending aorta. Pulmonary Artery: The pulmonary artery is of normal size. +--------------+--------++  LEFT VENTRICLE            +--------------+--------++  PLAX 2D                   +--------------+--------++  LVOT diam:     2.40 cm    +--------------+--------++  LVOT Area:     4.52 cm   +--------------+--------++                            +--------------+--------++  +--------------+-------+  SHUNTS                  +--------------+-------+  Systemic Diam: 2.40 cm   +--------------+-------+  Hoy Morn MD Electronically signed by Hoy Morn MD Signature Date/Time: 11/05/2021/3:46:21 PM    Final     Assessment/Plan  1. Other specified hypotension -  will discontinue Lisinopril and continue metoprolol tartrate 12.5 mg twice a day, hold for SBP<105 and HR <60  2. ABLA (acute blood loss anemia) Lab Results  Component Value Date   WBC 8.7 11/26/2021   HGB 11.4 (A) 11/26/2021   HCT 33 (A) 11/26/2021   MCV 87.8 11/13/2021   PLT 238 11/26/2021   -  improved, will monitor    Family/ staff Communication: Discussed plan of care with resident and charge nurse  Labs/tests ordered:  None    Durenda Age, DNP, MSN, FNP-BC [facility] [phone] (Monday-Friday 8:00 a.m. - 5:00 p.m.) [phone] (after hours)

## 2021-12-15 ENCOUNTER — Non-Acute Institutional Stay (SKILLED_NURSING_FACILITY): Payer: Medicare HMO | Admitting: Adult Health

## 2021-12-15 ENCOUNTER — Encounter: Payer: Self-pay | Admitting: Adult Health

## 2021-12-15 ENCOUNTER — Other Ambulatory Visit: Payer: Self-pay | Admitting: Adult Health

## 2021-12-15 DIAGNOSIS — I10 Essential (primary) hypertension: Secondary | ICD-10-CM | POA: Diagnosis not present

## 2021-12-15 DIAGNOSIS — E785 Hyperlipidemia, unspecified: Secondary | ICD-10-CM

## 2021-12-15 DIAGNOSIS — Z951 Presence of aortocoronary bypass graft: Secondary | ICD-10-CM | POA: Diagnosis not present

## 2021-12-15 DIAGNOSIS — Z950 Presence of cardiac pacemaker: Secondary | ICD-10-CM | POA: Diagnosis not present

## 2021-12-15 DIAGNOSIS — Z953 Presence of xenogenic heart valve: Secondary | ICD-10-CM

## 2021-12-15 DIAGNOSIS — F5101 Primary insomnia: Secondary | ICD-10-CM

## 2021-12-15 DIAGNOSIS — I442 Atrioventricular block, complete: Secondary | ICD-10-CM

## 2021-12-15 DIAGNOSIS — D62 Acute posthemorrhagic anemia: Secondary | ICD-10-CM

## 2021-12-15 DIAGNOSIS — I350 Nonrheumatic aortic (valve) stenosis: Secondary | ICD-10-CM | POA: Diagnosis not present

## 2021-12-15 DIAGNOSIS — K219 Gastro-esophageal reflux disease without esophagitis: Secondary | ICD-10-CM

## 2021-12-15 MED ORDER — ATORVASTATIN CALCIUM 40 MG PO TABS: 40.0000 mg | ORAL_TABLET | Freq: Every day | ORAL | 0 refills | Status: DC

## 2021-12-15 MED ORDER — ASPIRIN EC 81 MG PO TBEC: 81.0000 mg | DELAYED_RELEASE_TABLET | Freq: Every day | ORAL | 0 refills | Status: DC

## 2021-12-15 MED ORDER — OMEPRAZOLE 40 MG PO CPDR: 40.0000 mg | DELAYED_RELEASE_CAPSULE | Freq: Every day | ORAL | 0 refills | Status: DC

## 2021-12-15 MED ORDER — METOPROLOL TARTRATE 25 MG PO TABS: 12.5000 mg | ORAL_TABLET | Freq: Two times a day (BID) | ORAL | 0 refills | Status: DC

## 2021-12-15 MED ORDER — TRAMADOL HCL 50 MG PO TABS: 50.0000 mg | ORAL_TABLET | Freq: Four times a day (QID) | ORAL | 0 refills | Status: DC | PRN

## 2021-12-15 MED ORDER — MELATONIN 5 MG PO TABS: 5.0000 mg | ORAL_TABLET | Freq: Every evening | ORAL | 0 refills | Status: DC | PRN

## 2021-12-15 NOTE — Progress Notes (Signed)
Location:  [place] Room Number: 409-W Place of Service:  SNF (31) Provider:  Durenda Age, DNP, FNP-BC  Patient Care Team: Shelda Pal, DO as PCP - General (Family Medicine) Vickie Epley, MD as PCP - Electrophysiology (Cardiology) Croitoru, Dani Gobble, MD as PCP - Cardiology (Cardiology)  Extended Emergency Contact Information Primary Emergency Contact: [place] Phone: [phone] Mobile Phone: [phone] Relation: Son Secondary Emergency Contact: Hector,[name] Mobile Phone: [phone] Relation: Son Preferred language: English Interpreter needed? No  Code Status:  FULL CODE  Goals of care: Advanced Directive information Advanced Directives 12/15/2021  Does Patient Have a Medical Advance Directive? No  Type of Advance Directive -  Does patient want to make changes to medical advance directive? No - Patient declined  Copy of [place] in Chart? -  Would patient like information on creating a medical advance directive? -     Chief Complaint  Patient presents with   Discharge Note    Discharge from SNF    HPI:  Pt is a 86 y.o. male who is for discharge home on 12/16/21 with Home health PT and OT.  He was admitted to [facility] on 11/21/21 post hospital admission 11/03/2021 to 11/21/21.  He has a PMH of PVCs, arthritis, GERD, hypertension, hyperlipidemia and aortic stenosis.  He got lightheaded and ultimately passed out while walking in his neighborhood.  PCP referred him to Dr. Audie Box, cardiologist.  In 2017, he was diagnosed with aortic stenosis with no follow-ups.  He was noted to be lower extremity edema for several months.  Repeat echocardiogram showed EF of 60 to 65% with severe aortic stenosis.  His aortic valve leaflets were calcified and thickened.  He was referred to Dr. Angelena Form for possible TAVR procedure.  Cardiac catheterization was  performed on 11/03/2021 and showed 3v CAD.  On 11/04/2021, he underwent CABG x3.  He, also, underwent aortic valve replacement with bioprosthetic valve.  He had complete heart block and had pacemaker placement on 11/14/2021.  He had acute blood loss for which he had transfusion of 5 units PRBC.  He had thrombocytopenia for which he had a total transfusion of 5 units FFP and 3 platelets.  At [facility], Lisinopril was discontinued and Metoprolol tartrate was decreased to 12.5 mg BID due to hypotension.   Patient was admitted to this facility for short-term rehabilitation after the patient's recent hospitalization.  Patient has completed SNF rehabilitation and therapy has cleared the patient for discharge.   Past Medical History:  Diagnosis Date   Arthritis    Cataract    Esophageal stricture    GERD (gastroesophageal reflux disease)    Hyperlipidemia    Hypertension    Liver disease    hepatitis   Severe aortic stenosis    Past Surgical History:  Procedure Laterality Date   AORTIC VALVE REPLACEMENT N/A 11/04/2021   Procedure: AORTIC VALVE REPLACEMENT (AVR) USING 23 MM INSPIRIS RESILIA  AORTIC VALVE;  Surgeon: Lajuana Matte, MD;  Location: [facility];  Service: Open Heart Surgery;  Laterality: N/A;   cataract  2013   CORONARY ARTERY BYPASS GRAFT N/A 11/04/2021   Procedure: CORONARY ARTERY BYPASS GRAFTING (CABG) X 3, USING LEFT INTERNAL MAMMARY ARTERY AND ENDOSCOPICALLY HARVESTED RIGHT GREATER SAPHENOUS VEIN. LIMA TO LAD, SVG TO PDA, SVG TO OM;  Surgeon: Lajuana Matte, MD;  Location: [facility];  Service: Open Heart Surgery;  Laterality: N/A;   ENDOVEIN HARVEST OF GREATER  SAPHENOUS VEIN Right 11/04/2021   Procedure: ENDOVEIN HARVEST OF GREATER SAPHENOUS VEIN;  Surgeon: Lajuana Matte, MD;  Location: [facility];  Service: Open Heart Surgery;  Laterality: Right;   EXPLORATION POST OPERATIVE OPEN HEART N/A 11/05/2021   Procedure: EXPLORATION POST OPERATIVE OPEN HEARTFOR BLEEDING;  Surgeon:  Melrose Nakayama, MD;  Location: [facility];  Service: Open Heart Surgery;  Laterality: N/A;   HEMORROIDECTOMY  2001   HERNIA REPAIR  2005   INTRAVASCULAR PRESSURE WIRE/FFR STUDY N/A 11/03/2021   Procedure: INTRAVASCULAR PRESSURE WIRE/FFR STUDY;  Surgeon: Burnell Blanks, MD;  Location: [place] CV LAB;  Service: Cardiovascular;  Laterality: N/A;   PACEMAKER IMPLANT N/A 11/09/2021   Procedure: PACEMAKER IMPLANT;  Surgeon: Vickie Epley, MD;  Location: [place] CV LAB;  Service: Cardiovascular;  Laterality: N/A;   RIGHT/LEFT HEART CATH AND CORONARY ANGIOGRAPHY N/A 11/03/2021   Procedure: RIGHT/LEFT HEART CATH AND CORONARY ANGIOGRAPHY;  Surgeon: Burnell Blanks, MD;  Location: [place] CV LAB;  Service: Cardiovascular;  Laterality: N/A;   TEE WITHOUT CARDIOVERSION N/A 11/04/2021   Procedure: TRANSESOPHAGEAL ECHOCARDIOGRAM (TEE);  Surgeon: Lajuana Matte, MD;  Location: [facility];  Service: Open Heart Surgery;  Laterality: N/A;   TEMPORARY PACEMAKER N/A 11/07/2021   Procedure: TEMPORARY PACEMAKER;  Surgeon: [name], MD;  Location: [place] CV [facility];  Service: Cardiovascular;  Laterality: N/A;   TONSILLECTOMY  1948    No Known Allergies  Outpatient Encounter Medications as of 12/15/2021  Medication Sig   acetaminophen (TYLENOL) 325 MG tablet Take 1-2 tablets (325-650 mg total) by mouth every 4 (four) hours as needed for mild pain.   aspirin EC 81 MG tablet Take 1 tablet (81 mg total) by mouth daily. Swallow whole.   atorvastatin (LIPITOR) 40 MG tablet Take 1 tablet (40 mg total) by mouth daily.   bisacodyl (DULCOLAX) 10 MG suppository If not relieved by MOM, give 10 mg Bisacodyl suppositiory rectally X 1 dose in 24 hours as needed   diclofenac Sodium (VOLTAREN) 1 % GEL APPLY TOPICALLY TO LEFT KNEE TWICE A DAY FOR PAIN   Ensure (ENSURE) Take 237 mLs by mouth daily at 12 noon.   Glucosamine HCl (GLUCOSAMINE PO) Take 3,000 mg by mouth every other day.    ketoconazole (NIZORAL) 2 % shampoo Apply 1 application topically once a week.   Magnesium Hydroxide (MILK OF MAGNESIA PO) If no BM in 3 days, give 30 cc Milk of Magnesium p.o. x 1 dose in 24 hours as needed (Do not use standing constipation orders for residents with renal failure CFR less than 30. Contact MD for orders) (Physician Order)   melatonin 5 MG TABS Take 5 mg by mouth.   metoprolol tartrate (LOPRESSOR) 25 MG tablet Take 12.5 mg by mouth 2 (two) times daily. **Hold for HR less than 60, SBP less than or equal to 105**   Multiple Vitamin (MULTIVITAMIN) tablet Take 1 tablet by mouth daily.   omeprazole (PRILOSEC) 40 MG capsule TAKE 1 CAPSULE EVERY DAY   Sodium Phosphates (RA SALINE ENEMA RE) If not relieved by Biscodyl suppository, give disposable Saline Enema rectally X 1 dose/24 hrs as needed   traMADol (ULTRAM) 50 MG tablet Take 1 tablet (50 mg total) by mouth every 6 (six) hours as needed for moderate pain.   vitamin B-12 (CYANOCOBALAMIN) 1000 MCG tablet Take 1,000 mcg by mouth daily.   [DISCONTINUED] diclofenac (VOLTAREN) 50 MG EC tablet Take 50 mg by mouth 2 (two) times daily.  No facility-administered encounter medications on file as of 12/15/2021.    Review of Systems  Constitutional:  Negative for activity change, appetite change and fever.  HENT:  Negative for sore throat.   Eyes: Negative.   Cardiovascular:  Negative for chest pain and leg swelling.  Gastrointestinal:  Negative for abdominal distention, diarrhea and vomiting.  Genitourinary:  Negative for dysuria, frequency and urgency.  Skin:  Negative for color change.  Neurological:  Negative for dizziness and headaches.  Psychiatric/Behavioral:  Negative for behavioral problems and sleep disturbance. The patient is not nervous/anxious.       Immunization History  Administered Date(s) Administered   Influenza, High Dose Seasonal PF 07/19/2017, 06/19/2020   Influenza-Unspecified 07/20/2015, 07/10/2021   PFIZER  Comirnaty(Gray Top)Covid-19 Tri-Sucrose Vaccine 01/23/2021   PFIZER(Purple Top)SARS-COV-2 Vaccination 12/03/2019, 12/25/2019, 07/24/2020   PNEUMOCOCCAL CONJUGATE-20 10/24/2021   Pfizer Covid-19 Vaccine Bivalent Booster 21yrs & up 07/10/2021   Pneumococcal Polysaccharide-23 04/22/2018   Tdap 04/22/2018   Pertinent  Health Maintenance Due  Topic Date Due   INFLUENZA VACCINE  Completed   Fall Risk 11/19/2021 11/19/2021 11/20/2021 11/20/2021 11/21/2021  Falls in the past year? - - - - -  Was there an injury with Fall? - - - - -  Fall Risk Category Calculator - - - - -  Fall Risk Category - - - - -  Patient Fall Risk Level Moderate fall risk Moderate fall risk Moderate fall risk Moderate fall risk Moderate fall risk  Patient at Risk for Falls Due to - - - - -  Fall risk Follow up - - - - -     Vitals:   12/15/21 0957  BP: 110/71  Pulse: 81  Resp: 18  Temp: 97.7 F (36.5 C)  Height: 5\' 11"  (1.803 m)   Body mass index is 23.49 kg/m.  Physical Exam Constitutional:      Appearance: Normal appearance.  HENT:     Head: Normocephalic and atraumatic.     Mouth/Throat:     Mouth: Mucous membranes are moist.  Eyes:     Conjunctiva/sclera: Conjunctivae normal.  Cardiovascular:     Rate and Rhythm: Normal rate and regular rhythm.     Pulses: Normal pulses.     Heart sounds: Normal heart sounds.  Pulmonary:     Effort: Pulmonary effort is normal.     Breath sounds: Normal breath sounds.  Abdominal:     General: Bowel sounds are normal.     Palpations: Abdomen is soft.  Musculoskeletal:        General: No swelling. Normal range of motion.     Cervical back: Normal range of motion.  Skin:    General: Skin is warm and dry.     Comments: Left chest with steri strips on pacemaker surgical site. Midline and abdominal wound surgical wounds healed.  Neurological:     General: No focal deficit present.     Mental Status: He is alert and oriented to person, place, and time.  Psychiatric:         Mood and Affect: Mood normal.        Behavior: Behavior normal.        Thought Content: Thought content normal.        Judgment: Judgment normal.      Labs reviewed: Recent Labs    11/05/21 1649 11/06/21 0312 11/06/21 0716 11/11/21 0104 11/12/21 0053 11/13/21 0148 11/26/21 0000  NA 140 141   < > 130* 129* 132* 132*  K  3.9 3.7   < > 4.0 3.6 4.3 4.9  CL 103 107   < > 96* 94* 96* 97*  CO2 24 26   < > 24 24 27  26*  GLUCOSE 134* 136*   < > 112* 107* 111*  --   BUN 11 12   < > 14 14 16 18   CREATININE 1.30* 1.38*   < > 0.98 0.94 1.03 1.0  CALCIUM 7.3* 7.1*   < > 8.4* 8.5* 8.5* 9.4  MG 2.1 2.2  --   --  1.6*  --   --   PHOS  --   --   --   --  3.5  --   --    < > = values in this interval not displayed.   Recent Labs    04/09/21 1033 10/24/21 1531 11/03/21 2003  AST 33 50* 36  ALT 17 26 24   ALKPHOS 58  --  55  BILITOT 1.1 1.2 1.7*  PROT 7.1 7.1 6.3*  ALBUMIN 4.5  --  3.5   Recent Labs    08/01/21 1626 09/25/21 1133 11/11/21 0104 11/12/21 0053 11/13/21 0148 11/26/21 0000  WBC 6.1   < > 11.2* 10.7* 10.0 8.7  NEUTROABS 3,752  --   --   --   --   --   HGB 13.9   < > 9.8* 10.5* 9.8* 11.4*  HCT 39.6   < > 28.8* 30.8* 29.6* 33*  MCV 98.5   < > 87.0 87.3 87.8  --   PLT 126*   < > 126* 142* 181 238   < > = values in this interval not displayed.   Lab Results  Component Value Date   TSH 1.150 09/25/2021   Lab Results  Component Value Date   HGBA1C 5.3 11/03/2021   Lab Results  Component Value Date   CHOL 195 10/24/2021   HDL 101 10/24/2021   LDLCALC 78 10/24/2021   TRIG 76 10/24/2021   CHOLHDL 1.9 10/24/2021    Significant Diagnostic Results in last 30 days:  No results found.  Assessment/Plan  1. Severe aortic stenosis S/P CABG x 3 S/P aortic valve replacement with bioprosthetic valve - traMADol (ULTRAM) 50 MG tablet; Take 1 tablet (50 mg total) by mouth every 6 (six) hours as needed for moderate pain.  Dispense: 15 tablet; Refill: 0 - aspirin EC  81 MG tablet; Take 1 tablet (81 mg total) by mouth daily. Swallow whole.  Dispense: 30 tablet; Refill: 0 -  follow up with cardiology  4. CHB (complete heart block) (HCC) S/P placement of cardiac pacemaker -   follows up with EP  6. Essential hypertension - continue Metoprolol tartrate 25 mg 1/2 tab = 12.5 mg BID  7. ABLA (acute blood loss anemia) Lab Results  Component Value Date   WBC 8.7 11/26/2021   HGB 11.4 (A) 11/26/2021   HCT 33 (A) 11/26/2021   MCV 87.8 11/13/2021   PLT 238 11/26/2021   -  improved  8. Dyslipidemia - atorvastatin (LIPITOR) 40 MG tablet; Take 1 tablet (40 mg total) by mouth daily.  Dispense: 30 tablet; Refill: 0  9. Gastroesophageal reflux disease without esophagitis - omeprazole (PRILOSEC) 40 MG capsule; Take 1 capsule (40 mg total) by mouth daily.  Dispense: 30 capsule; Refill: 0  10. Primary insomnia - melatonin 5 MG TABS; Take 1 tablet (5 mg total) by mouth at bedtime as needed.  Dispense: 30 tablet; Refill: 0     I have  filled out patient's discharge paperwork and e-prescribed medications.  Patient will have home health PT and OT.  DME provided:  wheeled walker  Total discharge time: Greater than 30 minutes Greater than 50% was spent in counseling and coordination of care.    Discharge time involved coordination of the discharge process with social worker, nursing staff and therapy department. Medical justification for home health services/DME verified.   Durenda Age, DNP, MSN, FNP-BC [facility] [phone] (Monday-Friday 8:00 a.m. - 5:00 p.m.) [phone] (after hours)

## 2021-12-17 ENCOUNTER — Other Ambulatory Visit (HOSPITAL_COMMUNITY): Payer: Medicare HMO

## 2021-12-22 ENCOUNTER — Other Ambulatory Visit: Payer: Self-pay

## 2021-12-22 ENCOUNTER — Ambulatory Visit (HOSPITAL_COMMUNITY): Payer: Medicare HMO | Attending: Internal Medicine

## 2021-12-22 DIAGNOSIS — I350 Nonrheumatic aortic (valve) stenosis: Secondary | ICD-10-CM

## 2021-12-22 LAB — ECHOCARDIOGRAM COMPLETE
AR max vel: 1.5 cm2
AV Area VTI: 1.9 cm2
AV Area mean vel: 1.57 cm2
AV Mean grad: 17.5 mmHg
AV Peak grad: 32.6 mmHg
Ao pk vel: 2.86 m/s
Area-P 1/2: 3.13 cm2
S' Lateral: 1.5 cm

## 2021-12-24 ENCOUNTER — Encounter: Payer: Self-pay | Admitting: Family Medicine

## 2021-12-24 ENCOUNTER — Ambulatory Visit (INDEPENDENT_AMBULATORY_CARE_PROVIDER_SITE_OTHER): Payer: Medicare HMO | Admitting: Family Medicine

## 2021-12-24 ENCOUNTER — Other Ambulatory Visit: Payer: Self-pay | Admitting: Thoracic Surgery (Cardiothoracic Vascular Surgery)

## 2021-12-24 VITALS — BP 110/72 | HR 97 | Temp 98.0°F | Ht 72.0 in | Wt 171.2 lb

## 2021-12-24 DIAGNOSIS — Z951 Presence of aortocoronary bypass graft: Secondary | ICD-10-CM

## 2021-12-24 DIAGNOSIS — Z953 Presence of xenogenic heart valve: Secondary | ICD-10-CM

## 2021-12-24 NOTE — Progress Notes (Signed)
Chief Complaint  ?Patient presents with  ? Hospitalization Follow-up  ? ? ?Subjective: ?Patient is a 86 y.o. male here for f/u. ? ?Patient with a syncopal episode back in October 2022.  He was referred to cardiology.  He had 2 more episodes several months later.  He had an aortic valve replacement and CABG x3 on 11/04/21.  Postoperatively he required some fresh frozen plasma, platelets, and blood cells transfused.  He was discharged to a skilled nursing facility.  He feels well overall.  He does not have any pain.  While he has some chest tightness, he denies any wheezing, shortness of breath, or coughing.  He is having some weakness but is getting back into the swing of things living independently again.  He has physical therapy scheduled for this Friday.  His grandson has been living with him to help out.  He is leaving next week. ? ?Past Medical History:  ?Diagnosis Date  ? Arthritis   ? Cataract   ? Esophageal stricture   ? GERD (gastroesophageal reflux disease)   ? Hyperlipidemia   ? Hypertension   ? Liver disease   ? hepatitis  ? Severe aortic stenosis   ? ? ?Objective: ?BP 110/72   Pulse 97   Temp 98 ?F (36.7 ?C) (Oral)   Ht 6' (1.829 m)   Wt 171 lb 4 oz (77.7 kg)   SpO2 99%   BMI 23.23 kg/m?  ?General: Awake, appears stated age ?Heart: Regular rate and rhythm, 2/6 systolic murmur heard loudest at the pulmonic listening post, trace pitting LE edema around ankles ?Lungs: CTAB, no rales, wheezes or rhonchi. No accessory muscle use ?Psych: Age appropriate judgment and insight, normal affect and mood ? ?Assessment and Plan: ?S/P aortic valve replacement with bioprosthetic valve ? ?I filled out a form so he can park in handicap spaces.  He will let me know if he has any issues setting up physical therapy.  We will defer further management to his thoracic surgery and EP team.  I will cancel his appointment in July and see him in January for his yearly physical. ?The patient voiced understanding and agreement  to the plan. ? ?I spent 35 minutes discussing the above plan in addition to reviewing his lengthy hospital stay/chart on the same day of the visit. ? ?Shelda Pal, DO ?12/24/21  ?12:37 PM ? ? ? ? ?

## 2021-12-24 NOTE — Patient Instructions (Addendum)
Stay hydrated.  ? ?Let me know if there are any issues getting the physical therapy set up.  ? ?Keep the diet clean and stay active. ? ?Let us know if you need anything. ?

## 2021-12-25 ENCOUNTER — Ambulatory Visit
Admission: RE | Admit: 2021-12-25 | Discharge: 2021-12-25 | Disposition: A | Discharge status: Home / Self Care | Payer: Medicare HMO | Source: Ambulatory Visit | Attending: Thoracic Surgery (Cardiothoracic Vascular Surgery) | Admitting: Thoracic Surgery (Cardiothoracic Vascular Surgery)

## 2021-12-25 ENCOUNTER — Encounter: Payer: Self-pay | Admitting: Physician Assistant

## 2021-12-25 ENCOUNTER — Other Ambulatory Visit: Payer: Self-pay | Admitting: Thoracic Surgery (Cardiothoracic Vascular Surgery)

## 2021-12-25 ENCOUNTER — Other Ambulatory Visit: Payer: Self-pay

## 2021-12-25 ENCOUNTER — Ambulatory Visit (INDEPENDENT_AMBULATORY_CARE_PROVIDER_SITE_OTHER): Payer: Self-pay | Admitting: Physician Assistant

## 2021-12-25 VITALS — BP 122/80 | HR 79 | Resp 20 | Ht 71.5 in | Wt 171.0 lb

## 2021-12-25 DIAGNOSIS — Z951 Presence of aortocoronary bypass graft: Secondary | ICD-10-CM | POA: Diagnosis not present

## 2021-12-25 DIAGNOSIS — J939 Pneumothorax, unspecified: Secondary | ICD-10-CM | POA: Diagnosis not present

## 2021-12-25 DIAGNOSIS — Z950 Presence of cardiac pacemaker: Secondary | ICD-10-CM | POA: Diagnosis not present

## 2021-12-25 DIAGNOSIS — J90 Pleural effusion, not elsewhere classified: Secondary | ICD-10-CM | POA: Diagnosis not present

## 2021-12-25 NOTE — Patient Instructions (Addendum)
Endocarditis is a potentially serious infection of heart valves or inside lining of the heart.  It occurs more commonly in patients with diseased heart valves (such as patient's with aortic or mitral valve disease) and in patients who have undergone heart valve repair or replacement. ? ?Certain surgical and dental procedures may put you at risk, such as dental cleaning, other dental procedures, or any surgery involving the respiratory, urinary, gastrointestinal tract, gallbladder or prostate gland.  ? ?To minimize your chances for develooping endocarditis, maintain good oral health and seek prompt medical attention for any infections involving the mouth, teeth, gums, skin or urinary tract.   ? ?Always notify your doctor or dentist about your underlying heart valve condition before having any invasive procedures. You will need to take antibiotics before certain procedures, including all routine dental cleanings or other dental procedures.  Your cardiologist or dentist should prescribe these antibiotics for you to be taken ahead of time. ? ?You may continue to gradually increase your physical activity as tolerated.  Refrain from any heavy lifting or strenuous use of your arms and shoulders until at least 8 weeks from the time of your surgery, and avoid activities that cause increased pain in your chest on the side of your surgical incision.  Otherwise you may continue to increase activities without any particular limitations.  Increase the intensity and duration of physical activity gradually.  ? ? ?You are encouraged to enroll and participate in the outpatient cardiac rehab program beginning as soon as practical.  ? ?Please do not drive until cleared by EP (appointment on 12/31/2021 at 4:00 pm ?   ?

## 2021-12-25 NOTE — Progress Notes (Addendum)
? ?   ?[address] ?      [place] [address] ?            [phone]   ? ?   ? ?HPI: ?This is an 86 year old male who is s/p CABG x 3 (LIMA to LAD, SVG to OM, SVG to PDA) ?and bioprosthetic aortic valve replacement by Dr. Kipp Brood on 11/04/2021. He had to then undergo re exploration for bleeding (in part due to coagulopathy) on 01/18. EP evaluated the patient as he had loss of capture from EP wires and complete heart block. ?He had a dual chamber PPM placed by Dr. Quentin Ore on 11/09/2021. Patient on chest x ray ?11/10/2021 showed bilateral pneumothoraces. Dr. Roxan Hockey placed bilateral chest ?tubes. Pneumothoraces resolved and chest tubes were then removed. Patient was ?ultimately discharged to SNF on 11/21/2021. Per patient he was discharged last Tuesday from [facility]. He has been doing well. He denies fever, shortness of breath, chest pain, and syncope. ? ? ?Current Outpatient Medications  ?Medication Sig Dispense Refill  ? acetaminophen (TYLENOL) 325 MG tablet Take 1-2 tablets (325-650 mg total) by mouth every 4 (four) hours as needed for mild pain. (Patient not taking: Reported on 12/24/2021)    ? aspirin EC 81 MG tablet Take 1 tablet (81 mg total) by mouth daily. Swallow whole. 30 tablet 0  ? atorvastatin (LIPITOR) 40 MG tablet Take 1 tablet (40 mg total) by mouth daily. 30 tablet 0  ? Ensure (ENSURE) Take 237 mLs by mouth daily at 12 noon.    ? Glucosamine HCl (GLUCOSAMINE PO) Take 3,000 mg by mouth every other day.    ? ketoconazole (NIZORAL) 2 % shampoo Apply 1 application topically once a week.  6  ? melatonin 5 MG TABS Take 1 tablet (5 mg total) by mouth at bedtime as needed. 30 tablet 0  ? metoprolol tartrate (LOPRESSOR) 25 MG tablet Take 0.5 tablets (12.5 mg total) by mouth 2 (two) times daily. **Hold for HR less than 60, SBP less than or equal to 105** 30 tablet 0  ? Multiple Vitamin (MULTIVITAMIN) tablet Take 1 tablet by mouth daily.    ? omeprazole (PRILOSEC) 40 MG capsule Take 1  capsule (40 mg total) by mouth daily. 30 capsule 0  ? traMADol (ULTRAM) 50 MG tablet Take 1 tablet (50 mg total) by mouth every 6 (six) hours as needed for moderate pain. (Patient not taking: Reported on 12/24/2021) 15 tablet 0  ? vitamin B-12 (CYANOCOBALAMIN) 1000 MCG tablet Take 1,000 mcg by mouth daily.    ?Vital Signs: ?Vitals:  ? 12/25/21 1245  ?BP: 122/80  ?Pulse: 79  ?Resp: 20  ?SpO2: 98%  ? ?Physical Exam: ?CV-RRR ?Pulmonary-Clear to auscultation bilaterally ?Extremities-No LE edema ?Wounds-Clean and dry. Steri strips intact on PPM wound. No drainage or signs of infection. ? ?Diagnostic Tests: ?Narrative & Impression  ?CLINICAL DATA:  Recent CABG 2 months ago. ?  ?EXAM: ?CHEST - 2 VIEW ?  ?COMPARISON:  Chest x-ray dated November 14, 2021. ?  ?FINDINGS: ?Unchanged left chest wall pacemaker. Stable cardiomediastinal ?silhouette status post CABG and AVR with normal heart size. Normal ?pulmonary vascularity. No focal consolidation, pleural effusion, or ?pneumothorax. No acute osseous abnormality. ?  ?IMPRESSION: ?1. No acute cardiopulmonary disease. Resolved tiny left apical ?pneumothorax and small bilateral pleural effusions. ?  ?  ?Electronically Signed ?  By: Titus Dubin M.D. ?  On: 12/25/2021 12:36  ? ? ?IMPRESSIONS  ? ? ? 1. LV is hyperdynamic  with near cavity obliteration. . Left ventricular  ?ejection fraction, by estimation, is >75%. The left ventricle has  ?hyperdynamic function. The left ventricle has no regional wall motion abnormalities. There is severe concentric left ventricular hypertrophy. Left ventricular diastolic parameters are indeterminate.  ? 2. Right ventricular systolic function is normal. The right ventricular size is normal.  ? 3. Trivial mitral valve regurgitation.  ? 4. S/p AVR with 23 mm inspiris Resilia valve (11/04/21) Peak and mean gradients through the valve arer 31 and 17 mm HG respectively. AVA A(VTI)  ?is 1.9 cm2. Dimensionless index is 0.5.Marland Kitchen The aortic valve has been  repaired/replaced. Aortic valve  ?regurgitation is not visualized.  ? ?FINDINGS  ? Left Ventricle: LV is hyperdynamic with near cavity obliteration. Left ventricular ejection fraction, by estimation, is >75%. The left ventricle  ?has hyperdynamic function. The left ventricle has no regional wall motion abnormalities. The left  ?ventricular internal cavity size was small. There is severe concentric left ventricular hypertrophy. Left ventricular diastolic parameters are  ?indeterminate.  ? ?Right Ventricle: The right ventricular size is normal. Right vetricular wall thickness was not assessed. Right ventricular systolic function is  ?normal.  ? ?Left Atrium: Left atrial size was normal in size.  ? ?Right Atrium: Right atrial size was normal in size.  ? ?Pericardium: There is no evidence of pericardial effusion.  ? ?Mitral Valve: There is mild thickening of the mitral valve leaflet(s). Mild mitral annular calcification. Trivial mitral valve regurgitation.  ? ?Tricuspid Valve: The tricuspid valve is normal in structure. Tricuspid valve regurgitation is trivial.  ? ?Aortic Valve: S/p AVR with 23 mm inspiris Resilia valve (11/04/21) Peak and mean gradients through the valve arer 31 and 17 mm HG respectively. AVA  ?A(VTI) is 1.9 cm2. Dimensionless index is 0.5. The aortic valve has been repaired/replaced. Aortic valve  ?regurgitation is not visualized. Aortic valve mean gradient measures 17.5 mmHg. Aortic valve peak gradient measures 32.6 mmHg. Aortic valve area, by  ?VTI measures 1.90 cm?.  ? ?Pulmonic Valve: The pulmonic valve was normal in structure. Pulmonic valve regurgitation is mild.  ? ?Aorta: The aortic root and ascending aorta are structurally normal, with no evidence of dilitation.  ? ? ?Impression and Plan: ?Mr. Garron is recovering well from coronary artery bypass grafting surgery and AVR. We discussed endocarditis prophylaxis and the need for antibiotic prior to dental cleaning and/or sugery.  ?Patient was  instructed he may lift more than 10 pounds starting next Tuesday 03/14 as he will be 8 weeks our from surgery. Patient is NOT taking any narcotic for pain, he was instructed from TCTS point of view, he may begin driving short distances (I.e. 30 minutes or less during the day) and may gradually increase the frequency and duration as tolerates, once cleared from EP perspective. I did discuss with Barrington Ellison about removing steri strips and I sent him a wound photo (no sign of infection). He arranged for Mr. Moncus to have an appointment 12/31/2021 at 4:00 pm. EP will then instruct patient when he may drive. We encourage the patient to participate in cardiac rehab. He is still using a walker, although he has more confidence in his ability to walk and will likely not need it much longer. He has an appointment in April to see Dr. Quentin Ore and in May to see cardiology. Post op echo results as noted above. Patient to return to see Dr. Kipp Brood PRN. ? ? ? ?Nani Skillern, PA-C ?[facility] ?[phone] ? ? ? ? ? ? ?

## 2021-12-30 DIAGNOSIS — M79672 Pain in left foot: Secondary | ICD-10-CM | POA: Diagnosis not present

## 2021-12-30 DIAGNOSIS — M79671 Pain in right foot: Secondary | ICD-10-CM | POA: Diagnosis not present

## 2021-12-30 DIAGNOSIS — L602 Onychogryphosis: Secondary | ICD-10-CM | POA: Diagnosis not present

## 2021-12-31 ENCOUNTER — Other Ambulatory Visit: Payer: Self-pay

## 2021-12-31 ENCOUNTER — Ambulatory Visit (INDEPENDENT_AMBULATORY_CARE_PROVIDER_SITE_OTHER): Payer: Medicare HMO

## 2021-12-31 DIAGNOSIS — I442 Atrioventricular block, complete: Secondary | ICD-10-CM | POA: Diagnosis not present

## 2021-12-31 LAB — CUP PACEART INCLINIC DEVICE CHECK
Battery Remaining Longevity: 115 mo
Battery Voltage: 3.17 V
Brady Statistic AP VP Percent: 36.2 %
Brady Statistic AP VS Percent: 0.11 %
Brady Statistic AS VP Percent: 63.12 %
Brady Statistic AS VS Percent: 0.57 %
Brady Statistic RA Percent Paced: 36.4 %
Brady Statistic RV Percent Paced: 99.32 %
Date Time Interrogation Session: 20230315163420
Implantable Lead Implant Date: 20230122
Implantable Lead Implant Date: 20230122
Implantable Lead Location: 753859
Implantable Lead Location: 753860
Implantable Lead Model: 5076
Implantable Lead Model: 5076
Implantable Pulse Generator Implant Date: 20230122
Lead Channel Impedance Value: 266 Ohm
Lead Channel Impedance Value: 323 Ohm
Lead Channel Impedance Value: 418 Ohm
Lead Channel Impedance Value: 418 Ohm
Lead Channel Pacing Threshold Amplitude: 0.625 V
Lead Channel Pacing Threshold Amplitude: 0.875 V
Lead Channel Pacing Threshold Pulse Width: 0.4 ms
Lead Channel Pacing Threshold Pulse Width: 0.4 ms
Lead Channel Sensing Intrinsic Amplitude: 0.75 mV
Lead Channel Sensing Intrinsic Amplitude: 0.875 mV
Lead Channel Sensing Intrinsic Amplitude: 2.75 mV
Lead Channel Sensing Intrinsic Amplitude: 3.875 mV
Lead Channel Setting Pacing Amplitude: 3.5 V
Lead Channel Setting Pacing Amplitude: 3.5 V
Lead Channel Setting Pacing Pulse Width: 0.4 ms
Lead Channel Setting Sensing Sensitivity: 1.2 mV

## 2021-12-31 NOTE — Patient Instructions (Signed)
? ?  After Your Pacemaker ? ? ?Monitor your pacemaker site for redness, swelling, and drainage. Call the device clinic at [phone] if you experience these symptoms or fever/chills. ? ?Your incision was closed with Steri-strips or staples:  You may shower 7 days after your procedure and wash your incision with soap and water. Avoid lotions, ointments, or perfumes over your incision until it is well-healed. ? ? ?You may drive, unless driving has been restricted by your healthcare providers. ? ?Your Pacemaker is MRI compatible. ? ?Remote monitoring is used to monitor your pacemaker from home. This monitoring is scheduled every 91 days by our office. It allows [place] to keep an eye on the functioning of your device to ensure it is working properly. You will routinely see your Electrophysiologist annually (more often if necessary).  ?

## 2021-12-31 NOTE — Progress Notes (Signed)
Wound check appointment. Steri-strips removed prior to OV . Wound without redness or edema. Incision edges approximated, wound well healed. Normal device function. Thresholds, sensing, and impedances consistent with implant measurements. Device programmed at 3.5V/auto capture programmed on for extra safety margin until 3 month visit. Histogram distribution appropriate for patient and level of activity. No mode switches or high ventricular rates noted. Per CL lower rate programmed from 80-70bpm, patient had intact conduction PAV and SAV changed from 19m to 2581mand 15082mo 230m13mspectively  Patient educated about wound care, arm mobility, lifting restrictions. ROV 02/11/22 with CL ?

## 2022-01-01 ENCOUNTER — Telehealth: Payer: Self-pay | Admitting: Family Medicine

## 2022-01-01 NOTE — Telephone Encounter (Signed)
Forms received and placed in [facility] " signature " folder ?

## 2022-01-01 NOTE — Telephone Encounter (Signed)
[facility] called wanting to know if we had received a plan of care for the patient. Fax number was verified and they will be faxing it over again.  ?

## 2022-01-02 NOTE — Telephone Encounter (Signed)
Signed and faxed ?Received fax confirmation. ?

## 2022-01-15 NOTE — Telephone Encounter (Signed)
[employer] stated forms were sent over on 3/23 for DME equipment for pt.  ? ?Awaiting signature from provider.  ? Caryl Pina- [phone] ext [phone]. Please advise.  ?

## 2022-01-15 NOTE — Telephone Encounter (Signed)
Called [employer] to inform was signed/faxed and confirmation of fax received. ?They will refax form back over for [place] to complete/fax again. ?Informed PCP is out of office until 01/20/22. ?

## 2022-01-20 ENCOUNTER — Other Ambulatory Visit: Payer: Self-pay | Admitting: Family Medicine

## 2022-01-20 DIAGNOSIS — E785 Hyperlipidemia, unspecified: Secondary | ICD-10-CM

## 2022-01-20 DIAGNOSIS — I10 Essential (primary) hypertension: Secondary | ICD-10-CM

## 2022-01-20 MED ORDER — ATORVASTATIN CALCIUM 40 MG PO TABS: 40.0000 mg | ORAL_TABLET | Freq: Every day | ORAL | 0 refills | Status: DC

## 2022-01-20 MED ORDER — METOPROLOL TARTRATE 25 MG PO TABS: 12.5000 mg | ORAL_TABLET | Freq: Two times a day (BID) | ORAL | 0 refills | Status: DC

## 2022-01-22 ENCOUNTER — Other Ambulatory Visit: Payer: Self-pay | Admitting: Adult Health

## 2022-01-22 DIAGNOSIS — I10 Essential (primary) hypertension: Secondary | ICD-10-CM

## 2022-01-22 DIAGNOSIS — E785 Hyperlipidemia, unspecified: Secondary | ICD-10-CM

## 2022-01-28 ENCOUNTER — Other Ambulatory Visit: Payer: Self-pay | Admitting: Family Medicine

## 2022-01-28 DIAGNOSIS — K219 Gastro-esophageal reflux disease without esophagitis: Secondary | ICD-10-CM

## 2022-02-01 IMAGING — DX DG CHEST 1V PORT
1 series · 1 of 1 positions shown · non-contrast
Comparison: Radiographs 11/10/2021.  CT 10/31/2021.

CLINICAL DATA: Follow up pneumothorax. History of aortic valve
replacement one week ago.

EXAM:
PORTABLE CHEST 1 VIEW

[chest]
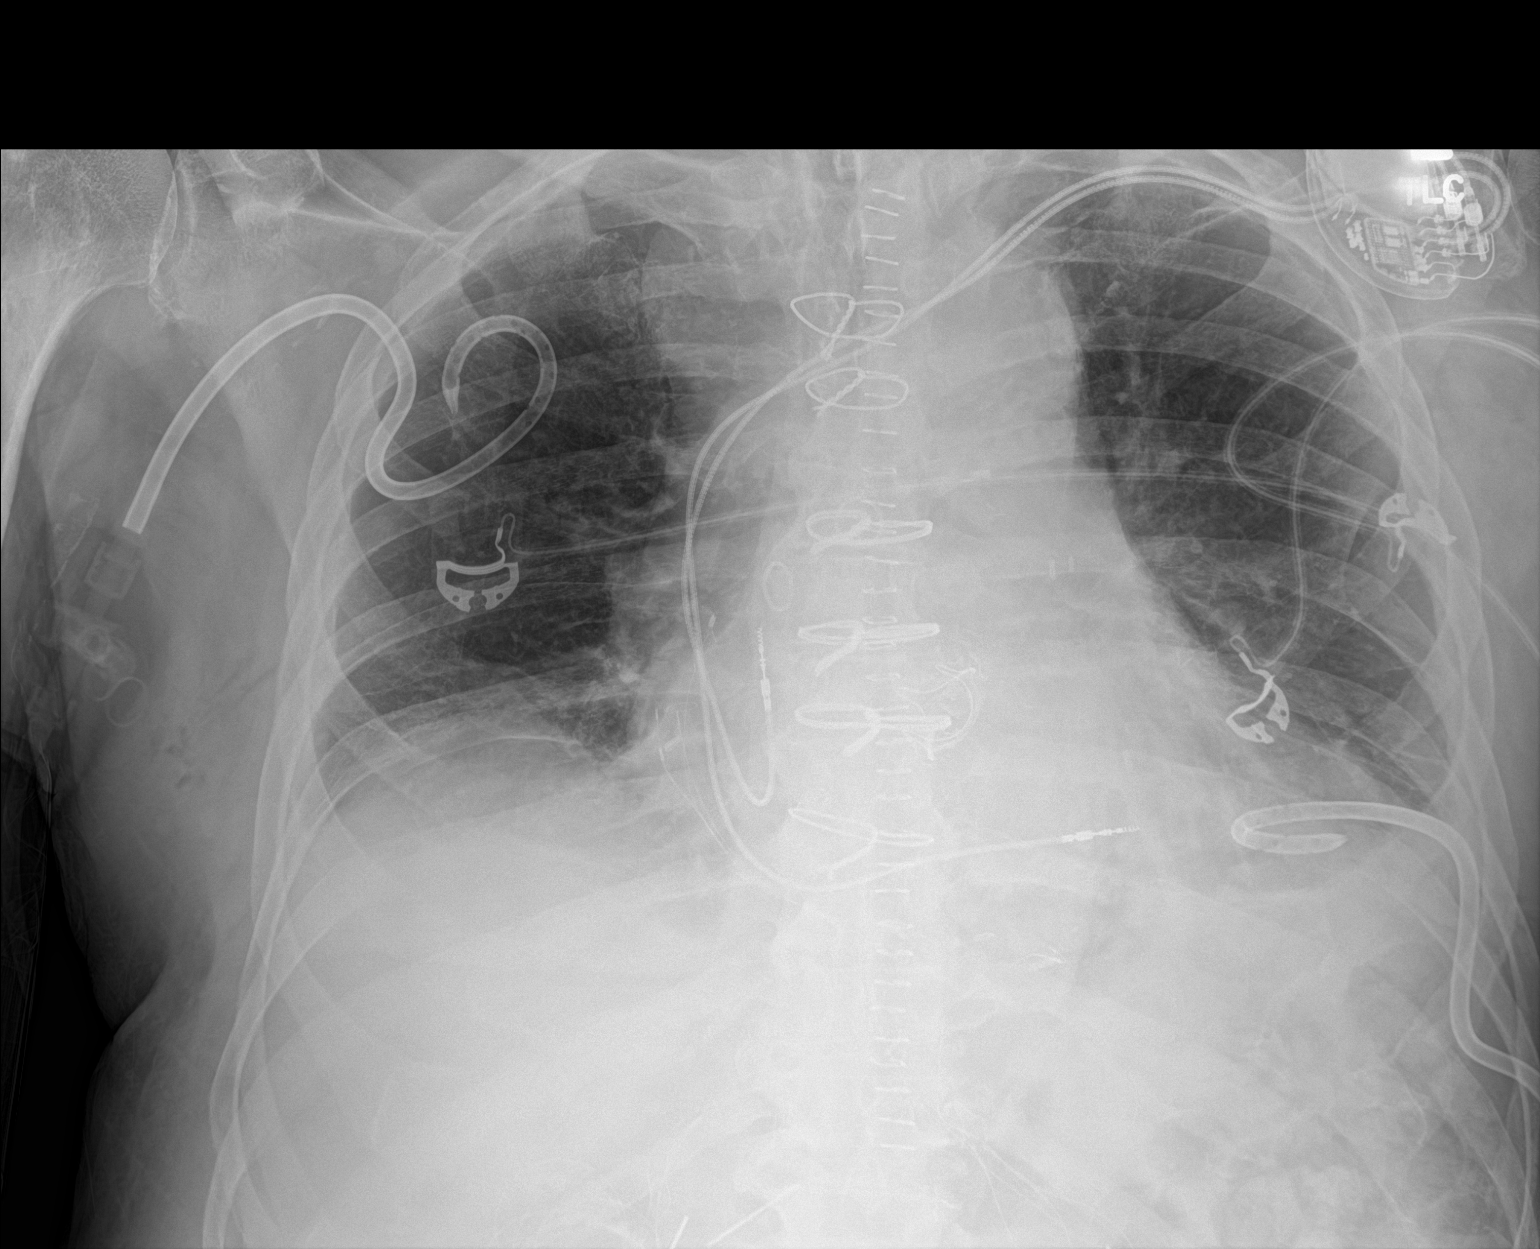

[1 of 1 positions shown; findings below may reference images not displayed]

FINDINGS: 3731 hours. Bilateral pigtail chest tubes remain in place with
better formation of the right-sided catheter. No residual
right-sided pneumothorax identified. There is a minimal residual
left-sided pneumothorax.

The heart size and mediastinal contours are stable status post CABG
and aortic valve replacement. There are persistent small bilateral
pleural effusions and mild bibasilar atelectasis. Left subclavian
pacemaker leads appear unchanged. Skin staples are in place.
IMPRESSION: Interval resolution of previously demonstrated right pneumothorax
with improvement in the left-sided pneumothorax. No other
significant changes.

## 2022-02-02 IMAGING — DX DG CHEST 1V PORT
1 series · 1 of 1 positions shown · non-contrast
Comparison: Chest x-ray dated November 11, 2021

CLINICAL DATA: Post sternotomy.

EXAM:
PORTABLE CHEST 1 VIEW

[chest ap]
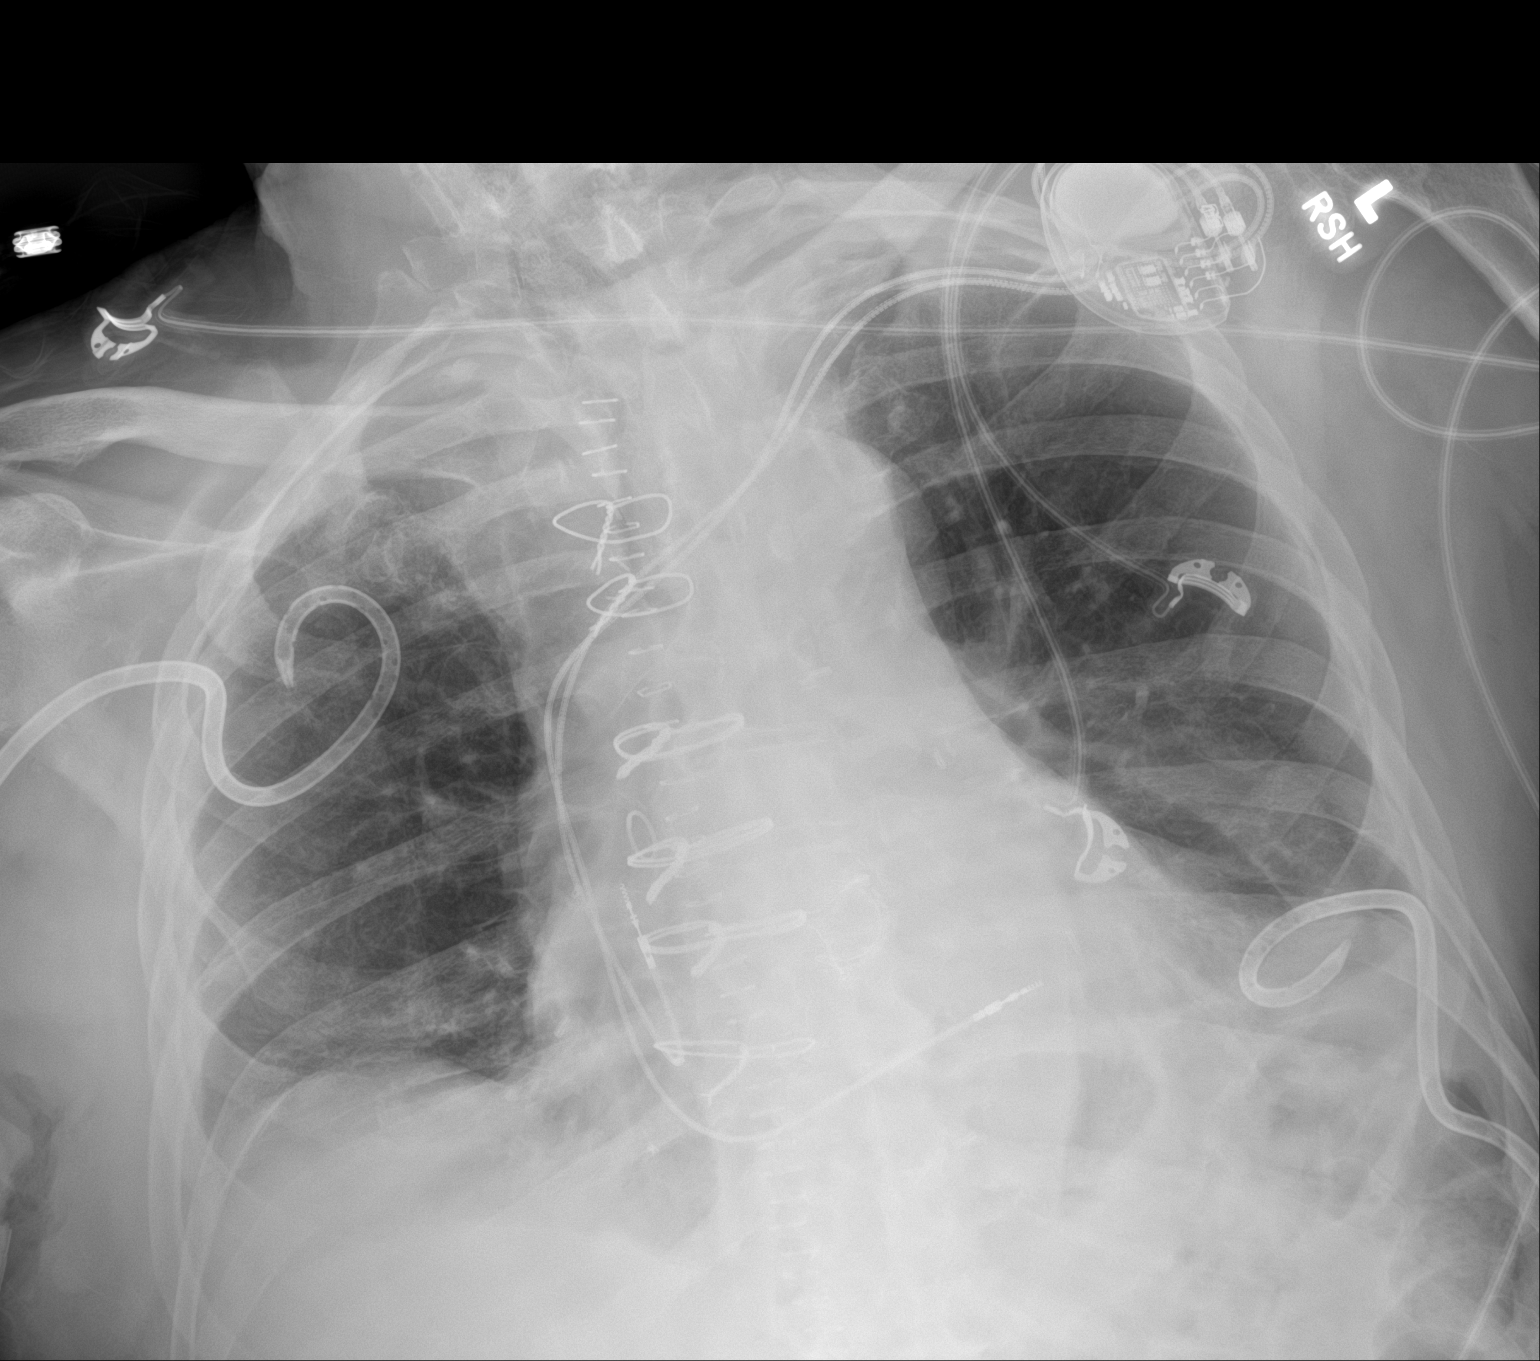

[1 of 1 positions shown; findings below may reference images not displayed]

FINDINGS: Cardiac and mediastinal contours are unchanged post median
sternotomy. Left chest wall dual lead pacer with unchanged lead
position. Mild bibasilar opacities which are likely due to
atelectasis. Trace left pneumothorax is stable to slightly decreased
in size when compared with prior exam. Bilateral chest tubes in
place.
IMPRESSION: Trace left pneumothorax is stable to slightly decreased in size when
compared with prior exam. Bilateral chest tubes in place.

## 2022-02-03 IMAGING — DX DG CHEST 1V PORT
1 series · 1 of 1 positions shown · non-contrast
Comparison: Chest x-ray dated November 12, 2021

CLINICAL DATA: Chest tube

EXAM:
PORTABLE CHEST 1 VIEW

[chest]
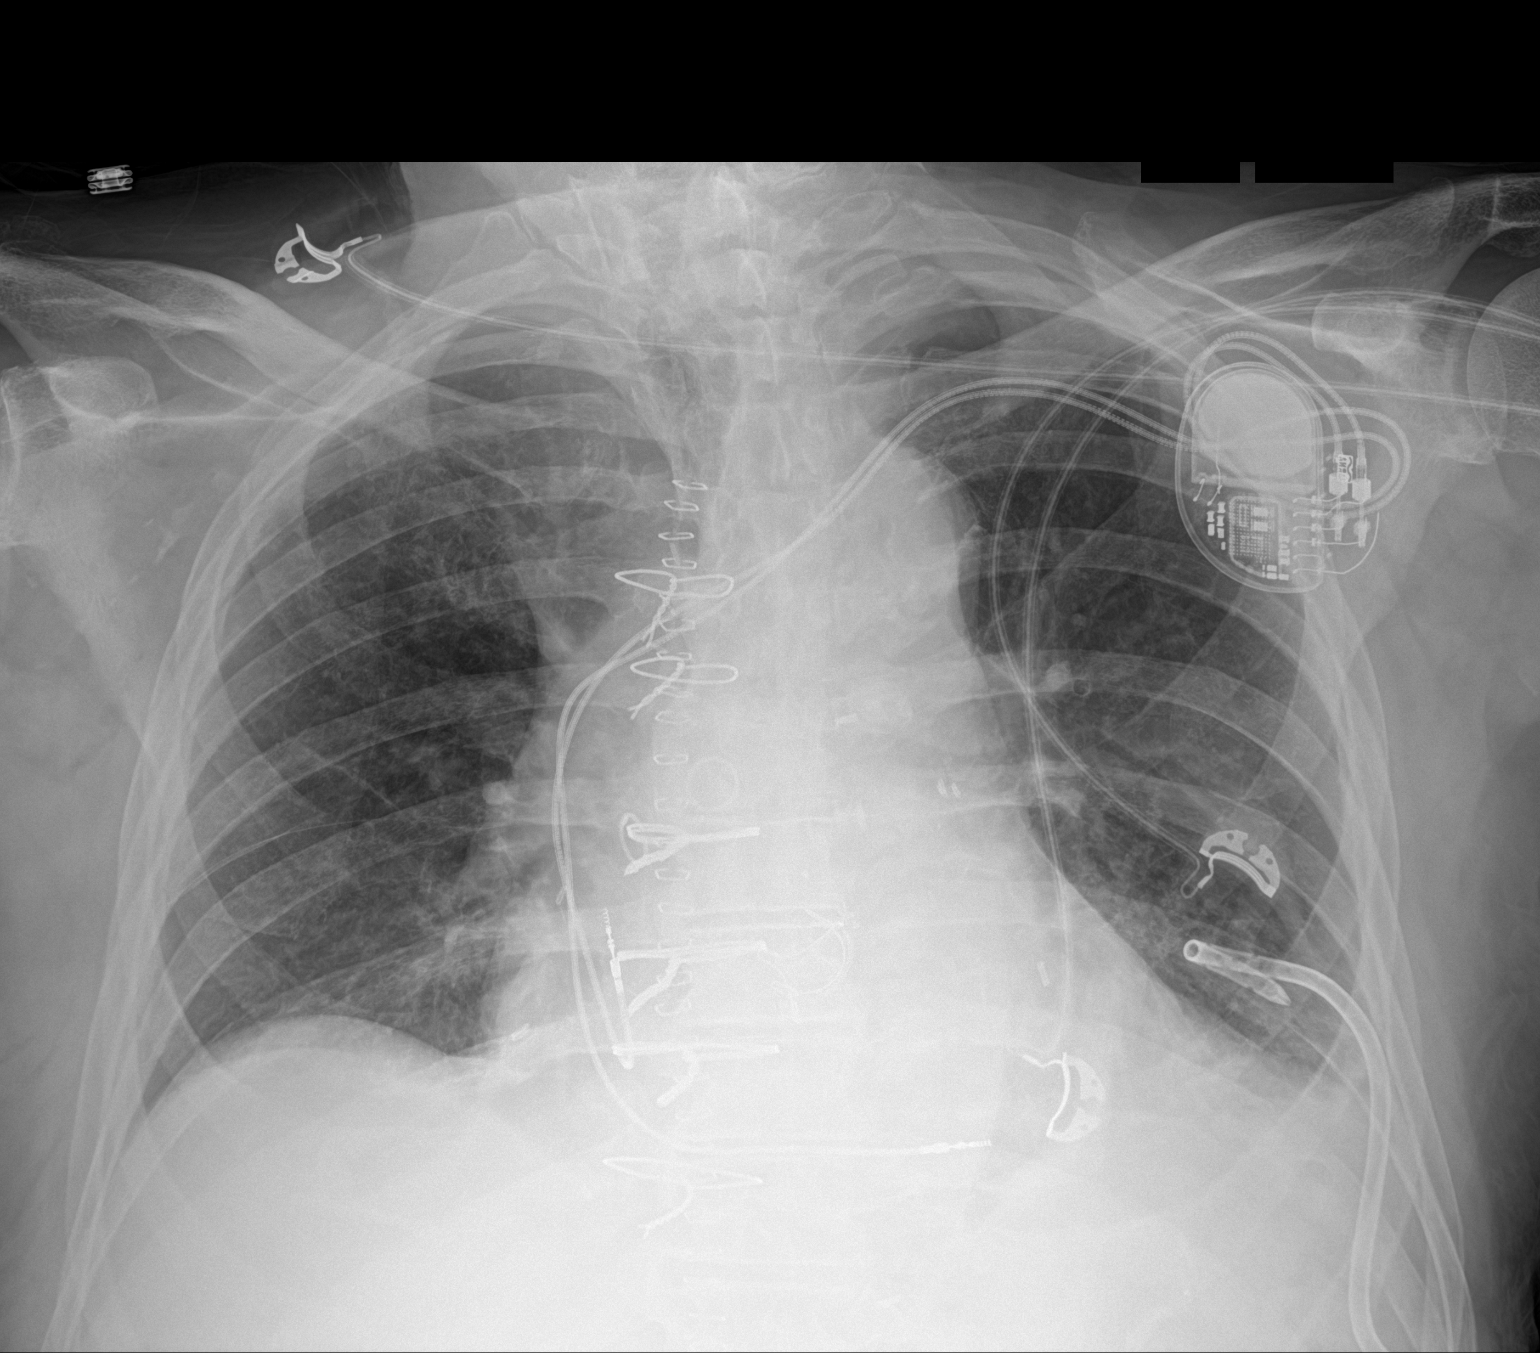

[1 of 1 positions shown; findings below may reference images not displayed]

FINDINGS: Interval removal right-sided chest tube. No evidence pneumothorax.
No visible left pneumothorax. Left-sided chest tube in place.

Cardiac and mediastinal contours are unchanged post median
sternotomy. Left chest wall dual lead pacer with unchanged lead
position. Mild left basilar opacity, likely atelectasis. Probable
small bilateral pleural effusions.
IMPRESSION: Interval removal right-sided chest tube. No evidence of
pneumothorax.

## 2022-02-04 IMAGING — CR DG CHEST 2V
2 series · 2 of 2 positions shown · non-contrast
Comparison: 11/13/2021

CLINICAL DATA: Chest tube removal

EXAM:
CHEST - 2 VIEW

[chest lat]
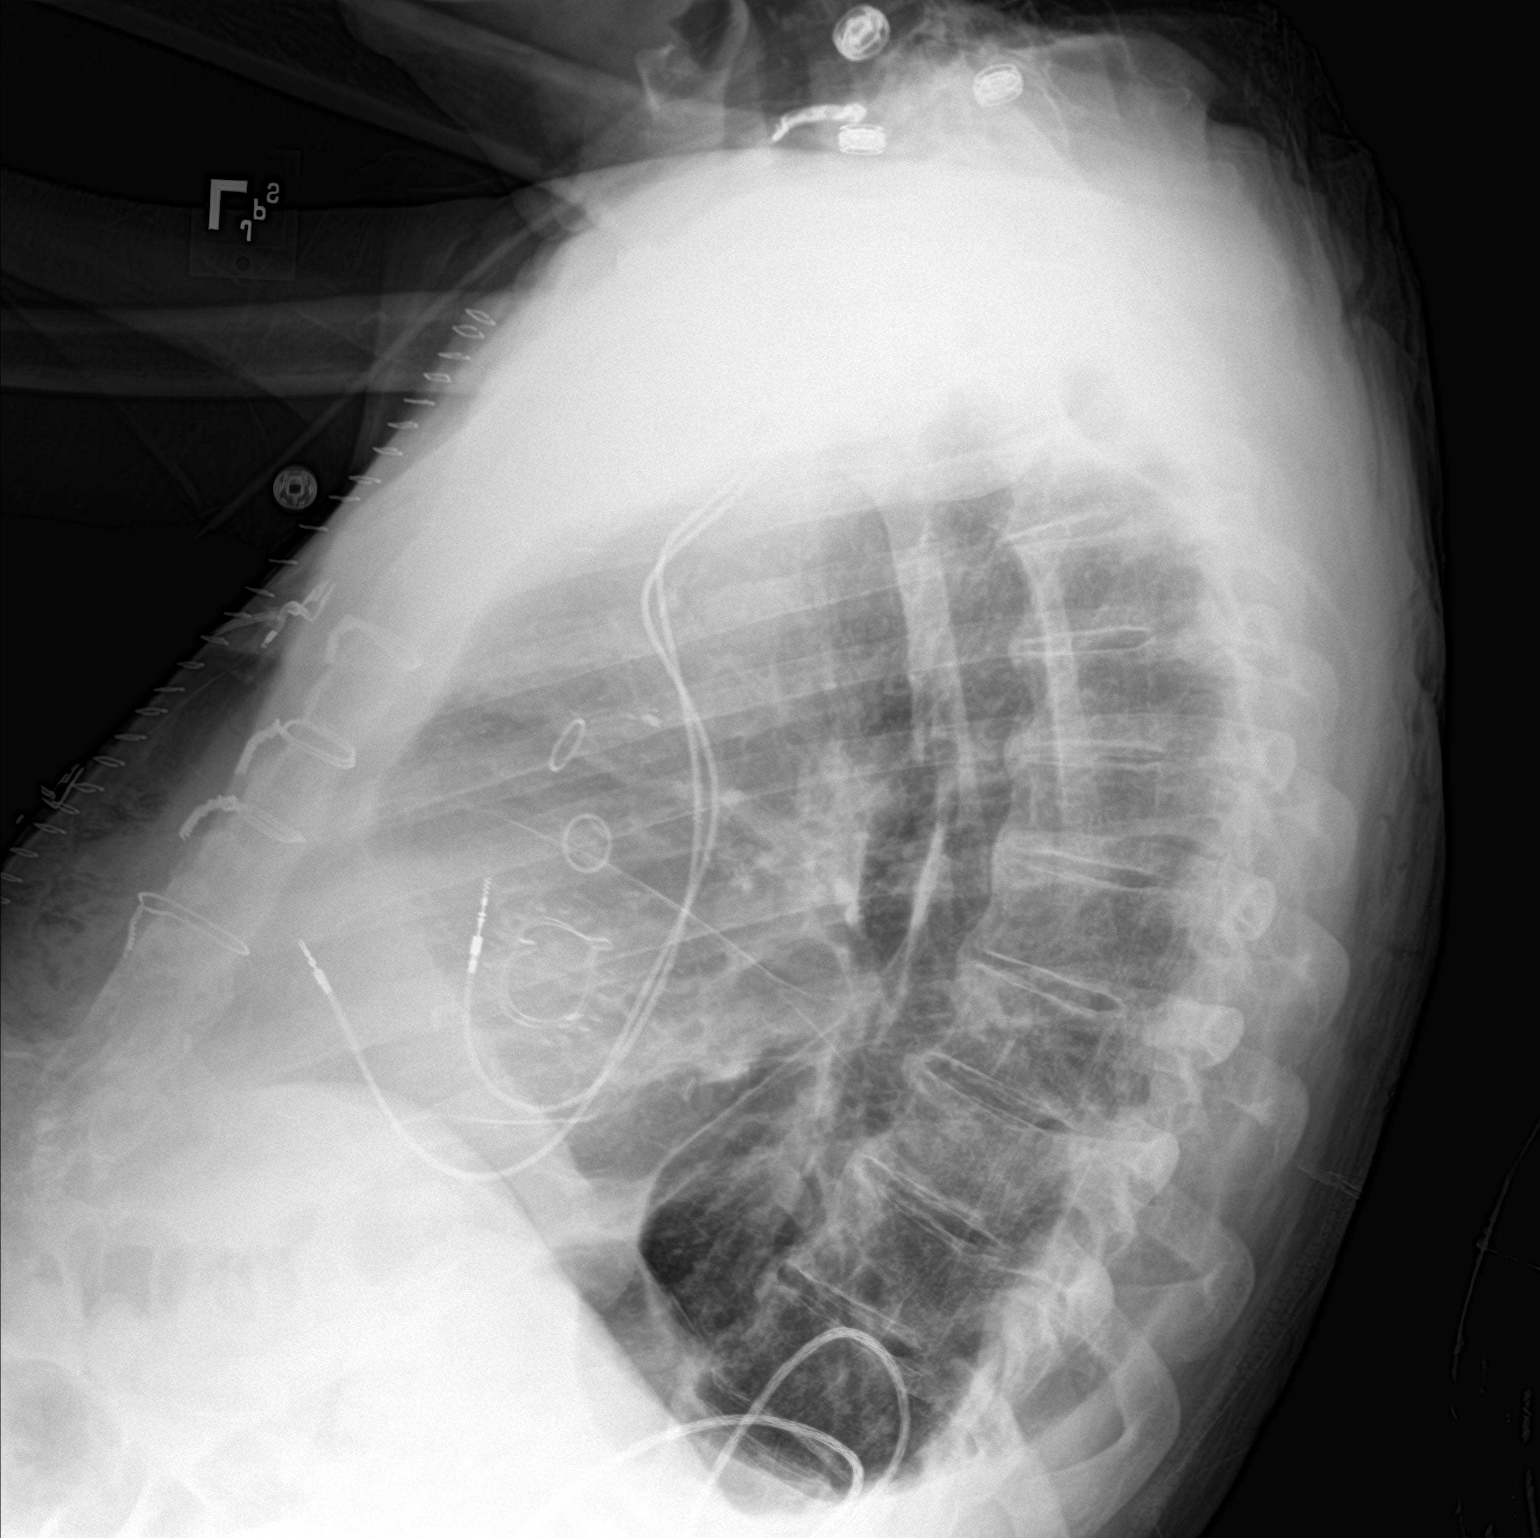

[chest ap]
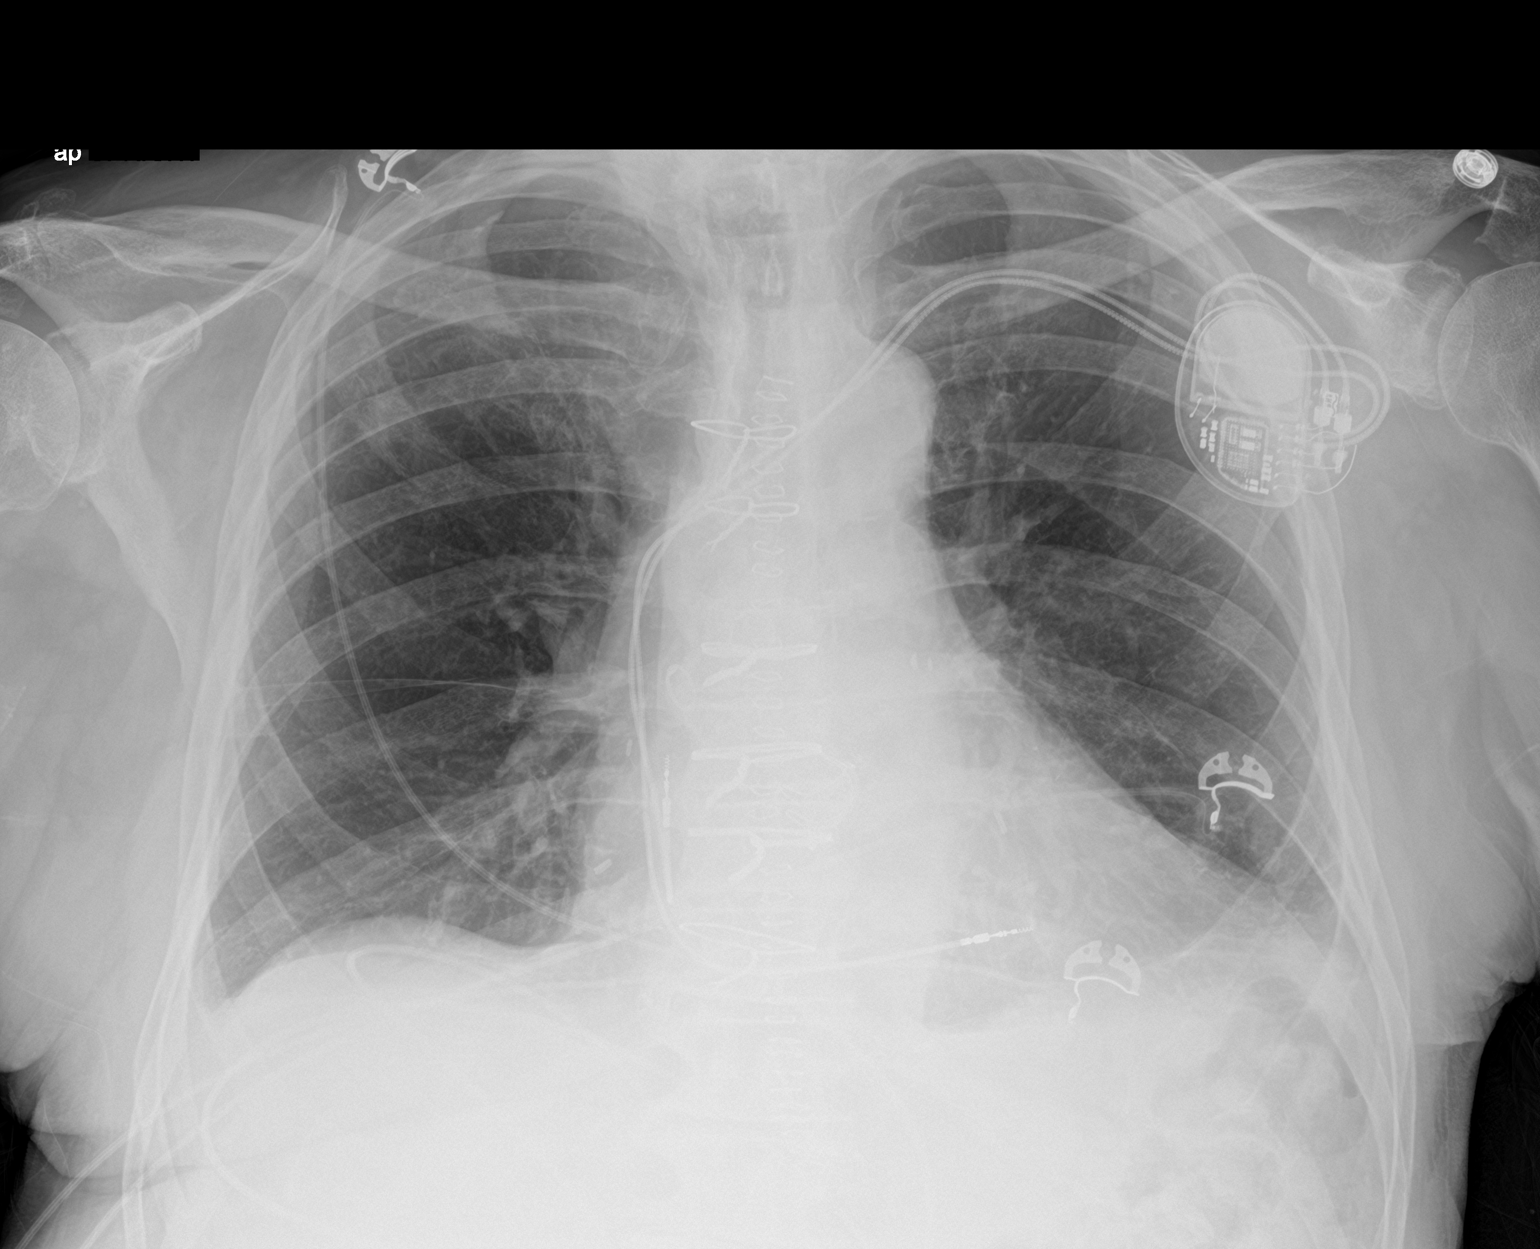

[2 of 2 positions shown; findings below may reference images not displayed]

FINDINGS: Interval removal of left basilar chest tube. Tiny left apical
pneumothorax (less than 5% volume). No right-sided pneumothorax.
Left chest wall cardiac device remains in place. Post CABG changes.
Stable heart size. No abnormal shift of the heart or mediastinal
structures. Mild left basilar atelectasis. Small bilateral pleural
effusions.
IMPRESSION: Interval removal of left basilar chest tube with tiny left apical
pneumothorax.

These results will be called to the ordering clinician or
representative by the Radiologist Assistant, and communication
documented in the PACS or [REDACTED].

## 2022-02-11 ENCOUNTER — Ambulatory Visit: Payer: Medicare HMO | Admitting: Cardiology

## 2022-02-11 ENCOUNTER — Encounter: Payer: Self-pay | Admitting: Cardiology

## 2022-02-11 VITALS — BP 122/82 | HR 72 | Ht 71.5 in | Wt 174.0 lb

## 2022-02-11 DIAGNOSIS — I442 Atrioventricular block, complete: Secondary | ICD-10-CM

## 2022-02-11 DIAGNOSIS — Z950 Presence of cardiac pacemaker: Secondary | ICD-10-CM

## 2022-02-11 LAB — CUP PACEART INCLINIC DEVICE CHECK
Battery Remaining Longevity: 139 mo
Battery Voltage: 3.17 V
Brady Statistic AP VP Percent: 20.96 %
Brady Statistic AP VS Percent: 0.87 %
Brady Statistic AS VP Percent: 22.22 %
Brady Statistic AS VS Percent: 55.94 %
Brady Statistic RA Percent Paced: 21.86 %
Brady Statistic RV Percent Paced: 43.19 %
Date Time Interrogation Session: 20230426160855
Implantable Lead Implant Date: 20230122
Implantable Lead Implant Date: 20230122
Implantable Lead Location: 753859
Implantable Lead Location: 753860
Implantable Lead Model: 5076
Implantable Lead Model: 5076
Implantable Pulse Generator Implant Date: 20230122
Lead Channel Impedance Value: 285 Ohm
Lead Channel Impedance Value: 342 Ohm
Lead Channel Impedance Value: 418 Ohm
Lead Channel Impedance Value: 418 Ohm
Lead Channel Pacing Threshold Amplitude: 0.625 V
Lead Channel Pacing Threshold Amplitude: 0.875 V
Lead Channel Pacing Threshold Pulse Width: 0.4 ms
Lead Channel Pacing Threshold Pulse Width: 0.4 ms
Lead Channel Sensing Intrinsic Amplitude: 0.875 mV
Lead Channel Sensing Intrinsic Amplitude: 0.875 mV
Lead Channel Sensing Intrinsic Amplitude: 3.25 mV
Lead Channel Sensing Intrinsic Amplitude: 3.75 mV
Lead Channel Setting Pacing Amplitude: 2 V
Lead Channel Setting Pacing Amplitude: 2.5 V
Lead Channel Setting Pacing Pulse Width: 0.4 ms
Lead Channel Setting Sensing Sensitivity: 1.2 mV

## 2022-02-11 NOTE — Progress Notes (Signed)
?Electrophysiology Office Follow up Visit Note:   ? ?Date:  02/11/2022  ? ?ID:  Joshua [name], DOB 04-26-1936, MRN [id number] ? ?PCP:  Shelda Pal, DO  ?[facility] Cardiologist:  Sanda Klein, MD  ?[facility] Electrophysiologist:  Vickie Epley, MD  ? ? ?Interval History:   ? ?Joshua [name] is a 86 y.o. male who presents for a follow up visit.  He underwent a pacemaker implant November 09, 2021.  He is done well since implant.  Remote interrogations of showed stable lead function. ? ? ? ?  ? ?Past Medical History:  ?Diagnosis Date  ? Arthritis   ? Cataract   ? Esophageal stricture   ? GERD (gastroesophageal reflux disease)   ? Hyperlipidemia   ? Hypertension   ? Liver disease   ? hepatitis  ? Severe aortic stenosis   ? ? ?Past Surgical History:  ?Procedure Laterality Date  ? AORTIC VALVE REPLACEMENT N/A 11/04/2021  ? Procedure: AORTIC VALVE REPLACEMENT (AVR) USING 23 MM INSPIRIS RESILIA  AORTIC VALVE;  Surgeon: Lajuana Matte, MD;  Location: [place];  Service: Open Heart Surgery;  Laterality: N/A;  ? cataract  2013  ? CORONARY ARTERY BYPASS GRAFT N/A 11/04/2021  ? Procedure: CORONARY ARTERY BYPASS GRAFTING (CABG) X 3, USING LEFT INTERNAL MAMMARY ARTERY AND ENDOSCOPICALLY HARVESTED RIGHT GREATER SAPHENOUS VEIN. LIMA TO LAD, SVG TO PDA, SVG TO OM;  Surgeon: Lajuana Matte, MD;  Location: [place];  Service: Open Heart Surgery;  Laterality: N/A;  ? ENDOVEIN HARVEST OF GREATER SAPHENOUS VEIN Right 11/04/2021  ? Procedure: ENDOVEIN HARVEST OF GREATER SAPHENOUS VEIN;  Surgeon: Lajuana Matte, MD;  Location: [place];  Service: Open Heart Surgery;  Laterality: Right;  ? EXPLORATION POST OPERATIVE OPEN HEART N/A 11/05/2021  ? Procedure: EXPLORATION POST OPERATIVE OPEN HEARTFOR BLEEDING;  Surgeon: Melrose Nakayama, MD;  Location: [place];  Service: Open Heart Surgery;  Laterality: N/A;  ? HEMORROIDECTOMY  2001  ? HERNIA REPAIR  2005  ? INTRAVASCULAR PRESSURE WIRE/FFR STUDY N/A 11/03/2021   ? Procedure: INTRAVASCULAR PRESSURE WIRE/FFR STUDY;  Surgeon: Burnell Blanks, MD;  Location: [place] [facility];  Service: Cardiovascular;  Laterality: N/A;  ? PACEMAKER IMPLANT N/A 11/09/2021  ? Procedure: PACEMAKER IMPLANT;  Surgeon: Vickie Epley, MD;  Location: [place] [facility];  Service: Cardiovascular;  Laterality: N/A;  ? RIGHT/LEFT HEART CATH AND CORONARY ANGIOGRAPHY N/A 11/03/2021  ? Procedure: RIGHT/LEFT HEART CATH AND CORONARY ANGIOGRAPHY;  Surgeon: Burnell Blanks, MD;  Location: [place] [facility];  Service: Cardiovascular;  Laterality: N/A;  ? TEE WITHOUT CARDIOVERSION N/A 11/04/2021  ? Procedure: TRANSESOPHAGEAL ECHOCARDIOGRAM (TEE);  Surgeon: Lajuana Matte, MD;  Location: [place];  Service: Open Heart Surgery;  Laterality: N/A;  ? TEMPORARY PACEMAKER N/A 11/07/2021  ? Procedure: TEMPORARY PACEMAKER;  Surgeon: [name], MD;  Location: [place] [facility];  Service: Cardiovascular;  Laterality: N/A;  ? TONSILLECTOMY  1948  ? ? ?Current Medications: ?Current Meds  ?Medication Sig  ? acetaminophen (TYLENOL) 325 MG tablet Take 1-2 tablets (325-650 mg total) by mouth every 4 (four) hours as needed for mild pain.  ? aspirin EC 81 MG tablet Take 1 tablet (81 mg total) by mouth daily. Swallow whole.  ? atorvastatin (LIPITOR) 40 MG tablet Take 1 tablet (40 mg total) by mouth daily.  ? Ensure (ENSURE) Take 237 mLs by mouth daily at 12 noon.  ? Glucosamine HCl (GLUCOSAMINE PO) Take 3,000 mg by  mouth every other day.  ? ketoconazole (NIZORAL) 2 % shampoo Apply 1 application topically once a week.  ? lisinopril (ZESTRIL) 20 MG tablet Take 20 mg by mouth daily.  ? melatonin 5 MG TABS Take 1 tablet (5 mg total) by mouth at bedtime as needed.  ? metoprolol tartrate (LOPRESSOR) 25 MG tablet Take 0.5 tablets (12.5 mg total) by mouth 2 (two) times daily. **Hold for HR less than 60, SBP less than or equal to 105**  ? Multiple Vitamin (MULTIVITAMIN) tablet Take 1 tablet by mouth daily.  ?  omeprazole (PRILOSEC) 40 MG capsule TAKE 1 CAPSULE EVERY DAY  ? vitamin B-12 (CYANOCOBALAMIN) 1000 MCG tablet Take 1,000 mcg by mouth daily.  ?  ? ?Allergies:   Patient has no known allergies.  ? ?Social History  ? ?Socioeconomic History  ? Marital status: Widowed  ?  Spouse name: Not on file  ? Number of children: 3  ? Years of education: Not on file  ? Highest education level: Not on file  ?Occupational History  ? Occupation: retired Medical illustrator firm  ? 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 Smoking status: Former  ?  Types: Cigarettes  ?  Quit date: 26  ?  Years since quitting: 58.3  ? Smokeless tobacco: Never  ?Substance and Sexual Activity  ? Alcohol use: Yes  ?  Alcohol/week: 21.0 standard drinks  ?  Types: 21 Shots of liquor per week  ?  Comment: daily a few cocktails  ? Drug use: No  ? Sexual activity: Never  ?Other Topics Concern  ? Not on file  ?Social History Narrative  ? Epworth Sleepiness Scale = 3 (as of 07/15/2016)  ? ?Social Determinants of Health  ? ?Financial Resource Strain: Low Risk   ? Difficulty of Paying Living Expenses: Not hard at all  ?Food Insecurity: No Food Insecurity  ? Worried About Charity fundraiser in the Last Year: Never true  ? Ran Out of Food in the Last Year: Never true  ?Transportation Needs: No Transportation Needs  ? Lack of Transportation (Medical): No  ? Lack of Transportation (Non-Medical): No  ?Physical Activity: Insufficiently Active  ? Days of Exercise per Week: 3 days  ? Minutes of Exercise per Session: 30 min  ?Stress: No Stress Concern Present  ? Feeling of Stress : Not at all  ?Social Connections: Moderately Integrated  ? Frequency of Communication with Friends and Family: Once a week  ? Frequency of Social Gatherings with Friends and Family: Three times a week  ? Attends Religious Services: More than 4 times per year  ? Active Member of Clubs or Organizations: Yes  ? Attends Archivist Meetings: 1 to 4 times per year  ?  Marital Status: Widowed  ?  ? ?Family History: ?The patient's family history includes Cancer in his maternal grandmother; Diabetes in his child; Heart Problems (age of onset: 96) in his child; Heart failure (age of onset: 55) in his father; Heart failure (age of onset: 29) in his mother. ? ?ROS:   ?Please see the history of present illness.    ?All other systems reviewed and are negative. ? ?EKGs/Labs/Other Studies Reviewed:   ? ?The following studies were reviewed today: ? ?February 11, 2022 in clinic device interrogation personally reviewed ?Stable lead parameters.  Longevity 11.5 years ?Lowered lower rate limit to 60 bpm ?43% RV pacing ?21% atrial pacing ? ?EKG:  The ekg ordered today demonstrates a sensed,  V sensed ? ?Recent Labs: ?09/25/2021: BNP 33.0; TSH 1.150 ?11/03/2021: ALT 24 ?11/12/2021: Magnesium 1.6 ?11/26/2021: BUN 18; Creatinine 1.0; Hemoglobin 11.4; Platelets 238; Potassium 4.9; Sodium 132  ?Recent Lipid Panel ?   ?Component Value Date/Time  ? CHOL 195 10/24/2021 1531  ? TRIG 76 10/24/2021 1531  ? HDL 101 10/24/2021 1531  ? CHOLHDL 1.9 10/24/2021 1531  ? VLDL 21.4 04/09/2021 1033  ? [place] 78 10/24/2021 1531  ? ? ?Physical Exam:   ? ?VS:  BP 122/82   Pulse 72   Ht 5' 11.5" (1.816 m)   Wt 174 lb (78.9 kg)   SpO2 99%   BMI 23.93 kg/m?    ? ?Wt Readings from Last 3 Encounters:  ?02/11/22 174 lb (78.9 kg)  ?12/25/21 171 lb (77.6 kg)  ?12/24/21 171 lb 4 oz (77.7 kg)  ?  ? ?GEN:  Well nourished, well developed in no acute distress ?HEENT: Normal ?NECK: No JVD; No carotid bruits ?LYMPHATICS: No lymphadenopathy ?CARDIAC: RRR, no murmurs, rubs, gallops.  Pacemaker pocket well-healed ?RESPIRATORY:  Clear to auscultation without rales, wheezing or rhonchi  ?ABDOMEN: Soft, non-tender, non-distended ?MUSCULOSKELETAL:  No edema; No deformity  ?SKIN: Warm and dry ?NEUROLOGIC:  Alert and oriented x 3 ?PSYCHIATRIC:  Normal affect  ? ? ? ?  ? ?ASSESSMENT:   ? ?1. CHB (complete heart block) (HCC)   ?2. Cardiac pacemaker  in situ   ? ?PLAN:   ? ?In order of problems listed above: ? ? ?#Complete heart block ?#Pacemaker in situ ?Device functioning appropriately.  Continue remote monitoring. ? ?I will plan to see him back in clin

## 2022-02-11 NOTE — Patient Instructions (Signed)

## 2022-02-13 ENCOUNTER — Encounter (HOSPITAL_COMMUNITY): Payer: Self-pay

## 2022-02-13 ENCOUNTER — Telehealth (HOSPITAL_COMMUNITY): Payer: Self-pay

## 2022-02-13 NOTE — Telephone Encounter (Signed)
Attempted to call patient in regards to Cardiac Rehab - LM on VM Mailed letter 

## 2022-02-17 ENCOUNTER — Ambulatory Visit (INDEPENDENT_AMBULATORY_CARE_PROVIDER_SITE_OTHER): Payer: Medicare HMO

## 2022-02-17 DIAGNOSIS — I442 Atrioventricular block, complete: Secondary | ICD-10-CM

## 2022-02-17 LAB — CUP PACEART REMOTE DEVICE CHECK
Battery Remaining Longevity: 145 mo
Battery Voltage: 3.17 V
Brady Statistic AP VP Percent: 15.82 %
Brady Statistic AP VS Percent: 1.24 %
Brady Statistic AS VP Percent: 23.8 %
Brady Statistic AS VS Percent: 59.15 %
Brady Statistic RA Percent Paced: 17.06 %
Brady Statistic RV Percent Paced: 39.62 %
Date Time Interrogation Session: 20230502010927
Implantable Lead Implant Date: 20230122
Implantable Lead Implant Date: 20230122
Implantable Lead Location: 753859
Implantable Lead Location: 753860
Implantable Lead Model: 5076
Implantable Lead Model: 5076
Implantable Pulse Generator Implant Date: 20230122
Lead Channel Impedance Value: 266 Ohm
Lead Channel Impedance Value: 342 Ohm
Lead Channel Impedance Value: 418 Ohm
Lead Channel Impedance Value: 418 Ohm
Lead Channel Pacing Threshold Amplitude: 0.625 V
Lead Channel Pacing Threshold Amplitude: 0.875 V
Lead Channel Pacing Threshold Pulse Width: 0.4 ms
Lead Channel Pacing Threshold Pulse Width: 0.4 ms
Lead Channel Sensing Intrinsic Amplitude: 0.75 mV
Lead Channel Sensing Intrinsic Amplitude: 0.75 mV
Lead Channel Sensing Intrinsic Amplitude: 3.375 mV
Lead Channel Sensing Intrinsic Amplitude: 3.375 mV
Lead Channel Setting Pacing Amplitude: 2 V
Lead Channel Setting Pacing Amplitude: 2 V
Lead Channel Setting Pacing Pulse Width: 0.4 ms
Lead Channel Setting Sensing Sensitivity: 1.2 mV

## 2022-02-20 DIAGNOSIS — H35371 Puckering of macula, right eye: Secondary | ICD-10-CM | POA: Diagnosis not present

## 2022-02-20 DIAGNOSIS — H18593 Other hereditary corneal dystrophies, bilateral: Secondary | ICD-10-CM | POA: Diagnosis not present

## 2022-02-20 DIAGNOSIS — H2511 Age-related nuclear cataract, right eye: Secondary | ICD-10-CM | POA: Diagnosis not present

## 2022-02-20 DIAGNOSIS — H35341 Macular cyst, hole, or pseudohole, right eye: Secondary | ICD-10-CM | POA: Diagnosis not present

## 2022-03-02 ENCOUNTER — Other Ambulatory Visit: Payer: Self-pay | Admitting: Family Medicine

## 2022-03-02 ENCOUNTER — Telehealth (HOSPITAL_COMMUNITY): Payer: Self-pay

## 2022-03-02 DIAGNOSIS — Z950 Presence of cardiac pacemaker: Secondary | ICD-10-CM

## 2022-03-02 DIAGNOSIS — I442 Atrioventricular block, complete: Secondary | ICD-10-CM

## 2022-03-02 NOTE — Telephone Encounter (Signed)
No response from pt regarding CR.  Closed referral.  

## 2022-03-04 NOTE — Progress Notes (Signed)
Remote pacemaker transmission.   

## 2022-03-12 ENCOUNTER — Encounter: Payer: Self-pay | Admitting: Cardiovascular Disease

## 2022-03-12 ENCOUNTER — Ambulatory Visit (INDEPENDENT_AMBULATORY_CARE_PROVIDER_SITE_OTHER): Payer: Medicare HMO | Admitting: Cardiovascular Disease

## 2022-03-12 VITALS — BP 125/60 | HR 104 | Ht 71.5 in | Wt 176.2 lb

## 2022-03-12 DIAGNOSIS — Z01810 Encounter for preprocedural cardiovascular examination: Secondary | ICD-10-CM

## 2022-03-12 DIAGNOSIS — I2510 Atherosclerotic heart disease of native coronary artery without angina pectoris: Secondary | ICD-10-CM

## 2022-03-12 DIAGNOSIS — I10 Essential (primary) hypertension: Secondary | ICD-10-CM

## 2022-03-12 DIAGNOSIS — Z950 Presence of cardiac pacemaker: Secondary | ICD-10-CM | POA: Diagnosis not present

## 2022-03-12 DIAGNOSIS — E7800 Pure hypercholesterolemia, unspecified: Secondary | ICD-10-CM | POA: Diagnosis not present

## 2022-03-12 DIAGNOSIS — I442 Atrioventricular block, complete: Secondary | ICD-10-CM

## 2022-03-12 DIAGNOSIS — Z953 Presence of xenogenic heart valve: Secondary | ICD-10-CM | POA: Diagnosis not present

## 2022-03-12 MED ORDER — AMOXICILLIN 500 MG PO TABS
ORAL_TABLET | ORAL | 1 refills | Status: DC
Start: 2022-03-12 — End: 2022-06-10

## 2022-03-12 MED ORDER — ASPIRIN 81 MG PO TBEC: 81.0000 mg | DELAYED_RELEASE_TABLET | Freq: Every day | ORAL | 3 refills | Status: DC

## 2022-03-12 NOTE — Progress Notes (Addendum)
Cardiology Office Note    Date:  03/12/2022   ID:  Joshua [name], DOB July 26, 1936, MRN [id number]  Requesting PCP:  Shelda Pal, DO  Cardiologist:   Sanda Klein, MD   Chief complaint: Follow-up echocardiogram and renal artery duplex ultrasound  History of Present Illness:  Joshua [name] is a 86 y.o. male with CAD and AS s/p SAVR (23 mm Inspiris) and CABG (LIMA-LAD, SVG-PDA, SVG-OM 11/04/2021, reop for bleeding 11/05/2021), postop CHB s/p PPM ([employer]onic Azure DR, Dr. Quentin Ore 11/09/2021), HTN, hyperlipidemia returning for f/u and preop evaluation for cataract surgery.  He has amnesia for most of the events surrounding his surgery and subsequent rehab, but now his memory has returned to normal and he is living independently again.  He is planning cataract surgery.  He is quite active, walking 2 hours a day, but is limited by his vision.  He denies chest pain or shortness of breath.  He has not had dizziness, palpitations or syncope.  He has mild ankle swelling.  Pacemaker function is normal.  Anticipated generator longevity is 12 years.  He has 22% atrial pacing.  Whereas in the first couple of months he required 100% ventricular pacing, he now only requires about 43% ventricular pacing.  Lead parameters are excellent.  Activity level is about 2 hours a day with good heart rate histogram distribution.  He is originally from [place] but has lived in [place], [place] and other parts of the [place]. His son lives in the area which is why he relocated to [place]. He is a retired Optometrist. He wants to live independently.  Past Medical History:  Diagnosis Date   Arthritis    Cataract    Esophageal stricture    GERD (gastroesophageal reflux disease)    Hyperlipidemia    Hypertension    Liver disease    hepatitis   Severe aortic stenosis     Past Surgical History:  Procedure Laterality Date   AORTIC VALVE REPLACEMENT N/A 11/04/2021   Procedure:  AORTIC VALVE REPLACEMENT (AVR) USING 23 MM INSPIRIS RESILIA  AORTIC VALVE;  Surgeon: Lajuana Matte, MD;  Location: [facility];  Service: Open Heart Surgery;  Laterality: N/A;   cataract  2013   CORONARY ARTERY BYPASS GRAFT N/A 11/04/2021   Procedure: CORONARY ARTERY BYPASS GRAFTING (CABG) X 3, USING LEFT INTERNAL MAMMARY ARTERY AND ENDOSCOPICALLY HARVESTED RIGHT GREATER SAPHENOUS VEIN. LIMA TO LAD, SVG TO PDA, SVG TO OM;  Surgeon: Lajuana Matte, MD;  Location: [facility];  Service: Open Heart Surgery;  Laterality: N/A;   ENDOVEIN HARVEST OF GREATER SAPHENOUS VEIN Right 11/04/2021   Procedure: ENDOVEIN HARVEST OF GREATER SAPHENOUS VEIN;  Surgeon: Lajuana Matte, MD;  Location: [facility];  Service: Open Heart Surgery;  Laterality: Right;   EXPLORATION POST OPERATIVE OPEN HEART N/A 11/05/2021   Procedure: EXPLORATION POST OPERATIVE OPEN HEARTFOR BLEEDING;  Surgeon: Melrose Nakayama, MD;  Location: [facility];  Service: Open Heart Surgery;  Laterality: N/A;   HEMORROIDECTOMY  2001   HERNIA REPAIR  2005   INTRAVASCULAR PRESSURE WIRE/FFR STUDY N/A 11/03/2021   Procedure: INTRAVASCULAR PRESSURE WIRE/FFR STUDY;  Surgeon: Burnell Blanks, MD;  Location: [facility];  Service: Cardiovascular;  Laterality: N/A;   PACEMAKER IMPLANT N/A 11/09/2021   Procedure: PACEMAKER IMPLANT;  Surgeon: Vickie Epley, MD;  Location: [facility];  Service: Cardiovascular;  Laterality: N/A;   RIGHT/LEFT HEART CATH AND CORONARY ANGIOGRAPHY N/A 11/03/2021   Procedure:  RIGHT/LEFT HEART CATH AND CORONARY ANGIOGRAPHY;  Surgeon: Burnell Blanks, MD;  Location: [facility];  Service: Cardiovascular;  Laterality: N/A;   TEE WITHOUT CARDIOVERSION N/A 11/04/2021   Procedure: TRANSESOPHAGEAL ECHOCARDIOGRAM (TEE);  Surgeon: Lajuana Matte, MD;  Location: [facility];  Service: Open Heart Surgery;  Laterality: N/A;   TEMPORARY PACEMAKER N/A 11/07/2021   Procedure: TEMPORARY PACEMAKER;  Surgeon:  [name], MD;  Location: [facility];  Service: Cardiovascular;  Laterality: N/A;   TONSILLECTOMY  1948    Current Medications: Outpatient Medications Prior to Visit  Medication Sig Dispense Refill   atorvastatin (LIPITOR) 40 MG tablet Take 1 tablet (40 mg total) by mouth daily. 90 tablet 0   Glucosamine HCl (GLUCOSAMINE PO) Take 3,000 mg by mouth every other day.     ketoconazole (NIZORAL) 2 % shampoo Apply 1 application topically once a week.  6   lisinopril (ZESTRIL) 20 MG tablet TAKE 1 TABLET EVERY DAY 90 tablet 1   melatonin 5 MG TABS Take 1 tablet (5 mg total) by mouth at bedtime as needed. 30 tablet 0   metoprolol tartrate (LOPRESSOR) 25 MG tablet Take 0.5 tablets (12.5 mg total) by mouth 2 (two) times daily. **Hold for HR less than 60, SBP less than or equal to 105** 90 tablet 0   Multiple Vitamin (MULTIVITAMIN) tablet Take 1 tablet by mouth daily.     omeprazole (PRILOSEC) 40 MG capsule TAKE 1 CAPSULE EVERY DAY 90 capsule 1   vitamin B-12 (CYANOCOBALAMIN) 1000 MCG tablet Take 1,000 mcg by mouth daily.     acetaminophen (TYLENOL) 325 MG tablet Take 1-2 tablets (325-650 mg total) by mouth every 4 (four) hours as needed for mild pain.     aspirin EC 81 MG tablet Take 1 tablet (81 mg total) by mouth daily. Swallow whole. 30 tablet 0   Ensure (ENSURE) Take 237 mLs by mouth daily at 12 noon.     No facility-administered medications prior to visit.     Allergies:   Patient has no known allergies.   Social History   Socioeconomic History   Marital status: Widowed    Spouse name: Not on file   Number of children: 3   Years of education: Not on file   Highest education level: Not on file  Occupational History   Occupation: retired Estate agent for [employer]   Occupation: Radiographer, 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  Tobacco Use   Smoking status: Former    Types: Cigarettes    Quit date: 1965    Years since quitting: 58.4   Smokeless tobacco: Never  Substance and Sexual Activity    Alcohol use: Yes    Alcohol/week: 21.0 standard drinks    Types: 21 Shots of liquor per week    Comment: daily a few cocktails   Drug use: No   Sexual activity: Never  Other Topics Concern   Not on file  Social History Narrative   Epworth Sleepiness Scale = 3 (as of 07/15/2016)   Social Determinants of Health   Financial Resource Strain: Low Risk    Difficulty of Paying Living Expenses: Not hard at all  Food Insecurity: No Food Insecurity   Worried About Charity fundraiser in the Last Year: Never true   Ran Out of Food in the Last Year: Never true  Transportation Needs: No Transportation Needs   Lack of Transportation (Medical): No   Lack of Transportation (Non-Medical): No  Physical Activity: Insufficiently Active  Days of Exercise per Week: 3 days   Minutes of Exercise per Session: 30 min  Stress: No Stress Concern Present   Feeling of Stress : Not at all  Social Connections: Moderately Integrated   Frequency of Communication with Friends and Family: Once a week   Frequency of Social Gatherings with Friends and Family: Three times a week   Attends Religious Services: More than 4 times per year   Active Member of Clubs or Organizations: Yes   Attends Archivist Meetings: 1 to 4 times per year   Marital Status: Widowed     Family History:  His father had rheumatic fever, valve replacement surgery and subsequent congestive heart failure. His mother also died of congestive heart failure when she was 84 years old. He has lost a couple of siblings at a young age in a car accident.  ROS:   Please see the history of present illness.    ROS All other systems reviewed and are negative.   PHYSICAL EXAM:   VS:  BP 125/60   Pulse (!) 104   Ht 5' 11.5" (1.816 m)   Wt 176 lb 3.2 oz (79.9 kg)   SpO2 96%   BMI 24.23 kg/m    General: Alert, oriented x3, no distress, appears well.  Healthy left subclavian pacemaker site Head: no evidence of trauma, PERRL, EOMI, no  exophtalmos or lid lag, no myxedema, no xanthelasma; normal ears, nose and oropharynx Neck: normal jugular venous pulsations and no hepatojugular reflux; brisk carotid pulses without delay and bilateral carotid bruits Chest: clear to auscultation, no signs of consolidation by percussion or palpation, normal fremitus, symmetrical and full respiratory excursions Cardiovascular: normal position and quality of the apical impulse, regular rhythm, normal first and second heart sounds, 1/6 early peaking systolic ejection murmur in the aortic focus, no diastolic murmurs, rubs or gallops Abdomen: no tenderness or distention, no masses by palpation, no abnormal pulsatility or arterial bruits, normal bowel sounds, no hepatosplenomegaly Extremities: no clubbing, cyanosis or edema; 2+ radial, ulnar and brachial pulses bilaterally; 2+ right femoral, posterior tibial and dorsalis pedis pulses; 2+ left femoral, posterior tibial and dorsalis pedis pulses; no subclavian or femoral bruits Neurological: grossly nonfocal Psych: Normal mood and affect   Wt Readings from Last 3 Encounters:  03/12/22 176 lb 3.2 oz (79.9 kg)  02/11/22 174 lb (78.9 kg)  12/25/21 171 lb (77.6 kg)      Studies/Labs Reviewed:  Echocardiogram 12/22/2021    1. LV is hyperdynamic with near cavity obliteration. . Left ventricular  ejection fraction, by estimation, is >75%. The left ventricle has  hyperdynamic function. The left ventricle has no regional wall motion  abnormalities. There is severe concentric left ventricular hypertrophy. Left ventricular diastolic parameters are indeterminate.   2. Right ventricular systolic function is normal. The right ventricular  size is normal.   3. Trivial mitral valve regurgitation.   4. S/p AVR with 23 mm inspiris Resilia valve (11/04/21) Peak and mean gradients through the valve arer 31 and 17 mm HG respectively. AVA A(VTI) is 1.9 cm2. Dimensionless index is 0.5.Marland Kitchen The aortic valve has been  repaired/replaced. Aortic valve regurgitation is not visualized.    Cardiac catheterization 11/03/2021  Mid RCA lesion is 99% stenosed.   Prox RCA lesion is 60% stenosed.   Mid LM lesion is 40% stenosed.   Ost LAD to Prox LAD lesion is 70% stenosed.   Prox LAD to Mid LAD lesion is 40% stenosed.   Prox Cx to  Mid Cx lesion is 40% stenosed.   Mild to moderate distal left main stenosis.  Severe, heavily calcified ostial LAD stenosis (RFR 0.78 suggesting the lesion is flow limiting).  Mild mid Circumflex stenosis Large dominant RCA with eccentric, nodular calcific lesion in the proximal vessel. This is a moderate stenosis. The mid RCA has a severe heavily calcified stenosis.  Normal right heart pressures Severe aortic stenosis by echo (aortic valve not crossed today)   Recommendations: He has severe, heavily calcified CAD involving the ostial LAD and the mid RCA. PCI of the LAD lesion would be high risk. I have reviewed his films with our interventional and structural team and I think the best option is to consider CABG and AVR. Given recent syncope, will admit him to the telemetry unit and have CT surgery see him to review options for CABG with surgical AVR. He appears to be a good candidate for surgery.    Diagnostic Dominance: Right  EKG:  EKG is ordered today.  The ekg ordered today demonstrates Normal sinus rhythm with a single PVC  Recent Labs: 09/25/2021: BNP 33.0; TSH 1.150 11/03/2021: ALT 24 11/12/2021: Magnesium 1.6 11/26/2021: BUN 18; Creatinine 1.0; Hemoglobin 11.4; Platelets 238; Potassium 4.9; Sodium 132     ASSESSMENT:    1. Coronary artery disease involving native coronary artery of native heart without angina pectoris   2. S/P aortic valve replacement with bioprosthetic valve   3. CHB (complete heart block) (HCC)   4. Cardiac pacemaker in situ   5. Hypercholesterolemia   6. Essential hypertension   7. Preoperative cardiovascular examination      PLAN:  In order of  problems listed above:  CAD s/p CABG: Asymptomatic.  Should be on aspirin 81 mg daily, but okay to interrupt this for cataract surgery. S/P AVR: Discussed the importance of endocarditis prophylaxis with dental procedures or other interventions that can cause severe bacteremia.  Standing prescription for amoxicillin.  Discussed the long-term behavior of prosthetic aortic valves. CHB: Had 100% pacing for the first couple of months after surgery, but now ventricular pacing only occurs about 44% of the time, suggesting recovery of AV node function with reduction in perioperative edema/injury. PPM: Normal device function.  System is MRI conditional.  Followed by Dr. Quentin Ore. HLP: On statin.  Target LDL less than 70, most recent value was 78 but his statin has been adjusted since then. HTN: He had severe elevation in blood pressure when using NSAIDs.  Prefer acetaminophen and sparing use of NSAIDs.  He is only taking low-dose metoprolol and a medium dose of lisinopril at this time with excellent blood pressure control. Preop CV exam: Low risk for major complications with planned cataract surgery.  If desired aspirin can be suspended for 5-7 days.  I faxed the "clearance" letter to his ophthalmologist at [facility].    Medication Adjustments/Labs and Tests Ordered: Current medicines are reviewed at length with the patient today.  Concerns regarding medicines are outlined above.  Medication changes, Labs and Tests ordered today are listed in the Patient Instructions below. Patient Instructions  Medication Instructions:  START Aspirin 81 mg once daily  Amoxicillin: Take 2000 mg (4 tablets) 2 hours prior to a dental procedure  *If you need a refill on your cardiac medications before your next appointment, please call your pharmacy*   Lab Work: None ordered  If you have labs (blood work) drawn today and your tests are completely normal, you will receive your results only by: [employer] (if  you have MyChart) OR A paper copy in the mail If you have any lab test that is abnormal or we need to change your treatment, we will call you to review the results.   Testing/Procedures: None ordered   Follow-Up: At [facility], you and your health needs are our priority.  As part of our continuing mission to provide you with exceptional heart care, we have created designated Provider Care Teams.  These Care Teams include your primary Cardiologist (physician) and Advanced Practice Providers (APPs -  Physician Assistants and Nurse Practitioners) who all work together to provide you with the care you need, when you need it.  We recommend signing up for the patient portal called "MyChart".  Sign up information is provided on this After Visit Summary.  MyChart is used to connect with patients for Virtual Visits (Telemedicine).  Patients are able to view lab/test results, encounter notes, upcoming appointments, etc.  Non-urgent messages can be sent to your provider as well.   To learn more about what you can do with MyChart, go to [email or web address].    Your next appointment:   12 month(s)  The format for your next appointment:   In Person  Provider:   Sanda Klein, MD {    Important Information About Sugar         Signed, Sanda Klein, MD  03/12/2022 10:32 AM    [address], [place], [place]  [address] Phone: [phone]; Fax: [phone]

## 2022-03-12 NOTE — Patient Instructions (Signed)
Medication Instructions:  START Aspirin 81 mg once daily  Amoxicillin: Take 2000 mg (4 tablets) 2 hours prior to a dental procedure  *If you need a refill on your cardiac medications before your next appointment, please call your pharmacy*   Lab Work: None ordered  If you have labs (blood work) drawn today and your tests are completely normal, you will receive your results only by: [employer] (if you have MyChart) OR A paper copy in the mail If you have any lab test that is abnormal or we need to change your treatment, we will call you to review the results.   Testing/Procedures: None ordered   Follow-Up: At [facility], you and your health needs are our priority.  As part of our continuing mission to provide you with exceptional heart care, we have created designated Provider Care Teams.  These Care Teams include your primary Cardiologist (physician) and Advanced Practice Providers (APPs -  Physician Assistants and Nurse Practitioners) who all work together to provide you with the care you need, when you need it.  We recommend signing up for the patient portal called "MyChart".  Sign up information is provided on this After Visit Summary.  MyChart is used to connect with patients for Virtual Visits (Telemedicine).  Patients are able to view lab/test results, encounter notes, upcoming appointments, etc.  Non-urgent messages can be sent to your provider as well.   To learn more about what you can do with MyChart, go to [email or web address].    Your next appointment:   12 month(s)  The format for your next appointment:   In Person  Provider:   Sanda Klein, MD {    Important Information About Sugar

## 2022-03-18 DIAGNOSIS — H2511 Age-related nuclear cataract, right eye: Secondary | ICD-10-CM | POA: Diagnosis not present

## 2022-03-26 ENCOUNTER — Ambulatory Visit: Payer: Medicare HMO | Admitting: Cardiovascular Disease

## 2022-04-01 ENCOUNTER — Other Ambulatory Visit: Payer: Self-pay | Admitting: Family Medicine

## 2022-04-01 DIAGNOSIS — I10 Essential (primary) hypertension: Secondary | ICD-10-CM

## 2022-04-07 DIAGNOSIS — H269 Unspecified cataract: Secondary | ICD-10-CM | POA: Diagnosis not present

## 2022-04-07 DIAGNOSIS — H2511 Age-related nuclear cataract, right eye: Secondary | ICD-10-CM | POA: Diagnosis not present

## 2022-04-22 DIAGNOSIS — M79672 Pain in left foot: Secondary | ICD-10-CM | POA: Diagnosis not present

## 2022-04-22 DIAGNOSIS — L602 Onychogryphosis: Secondary | ICD-10-CM | POA: Diagnosis not present

## 2022-04-22 DIAGNOSIS — M79671 Pain in right foot: Secondary | ICD-10-CM | POA: Diagnosis not present

## 2022-04-24 ENCOUNTER — Ambulatory Visit: Payer: Medicare HMO | Admitting: Family Medicine

## 2022-05-19 ENCOUNTER — Ambulatory Visit (INDEPENDENT_AMBULATORY_CARE_PROVIDER_SITE_OTHER): Payer: Medicare HMO

## 2022-05-19 DIAGNOSIS — I442 Atrioventricular block, complete: Secondary | ICD-10-CM | POA: Diagnosis not present

## 2022-05-19 LAB — CUP PACEART REMOTE DEVICE CHECK
Battery Remaining Longevity: 149 mo
Battery Voltage: 3.13 V
Brady Statistic AP VP Percent: 34.08 %
Brady Statistic AP VS Percent: 1.31 %
Brady Statistic AS VP Percent: 19.36 %
Brady Statistic AS VS Percent: 45.25 %
Brady Statistic RA Percent Paced: 35.39 %
Brady Statistic RV Percent Paced: 53.44 %
Date Time Interrogation Session: 20230801024742
Implantable Lead Implant Date: 20230122
Implantable Lead Implant Date: 20230122
Implantable Lead Location: 753859
Implantable Lead Location: 753860
Implantable Lead Model: 5076
Implantable Lead Model: 5076
Implantable Pulse Generator Implant Date: 20230122
Lead Channel Impedance Value: 285 Ohm
Lead Channel Impedance Value: 342 Ohm
Lead Channel Impedance Value: 418 Ohm
Lead Channel Impedance Value: 475 Ohm
Lead Channel Pacing Threshold Amplitude: 0.625 V
Lead Channel Pacing Threshold Amplitude: 0.875 V
Lead Channel Pacing Threshold Pulse Width: 0.4 ms
Lead Channel Pacing Threshold Pulse Width: 0.4 ms
Lead Channel Sensing Intrinsic Amplitude: 0.625 mV
Lead Channel Sensing Intrinsic Amplitude: 0.625 mV
Lead Channel Sensing Intrinsic Amplitude: 5 mV
Lead Channel Sensing Intrinsic Amplitude: 5 mV
Lead Channel Setting Pacing Amplitude: 1.75 V
Lead Channel Setting Pacing Amplitude: 2 V
Lead Channel Setting Pacing Pulse Width: 0.4 ms
Lead Channel Setting Sensing Sensitivity: 1.2 mV

## 2022-05-26 ENCOUNTER — Other Ambulatory Visit (HOSPITAL_BASED_OUTPATIENT_CLINIC_OR_DEPARTMENT_OTHER): Payer: Self-pay

## 2022-05-26 ENCOUNTER — Ambulatory Visit: Payer: Medicare HMO | Attending: Internal Medicine

## 2022-05-26 DIAGNOSIS — Z23 Encounter for immunization: Secondary | ICD-10-CM

## 2022-05-26 MED ORDER — PFIZER COVID-19 VAC BIVALENT 30 MCG/0.3ML IM SUSP
INTRAMUSCULAR | 0 refills | Status: DC
  Filled 2022-05-26: qty 0.3, 1d supply, fill #0

## 2022-05-26 NOTE — Progress Notes (Signed)
   Covid-19 Vaccination Clinic  Name:  Joshua [name]    MRN: [id number] DOB: 02-Jun-1936  05/26/2022  [name] was observed post Covid-19 immunization for 15 minutes without incident. He was provided with Vaccine Information Sheet and instruction to access the V-Safe system.   [name] was instructed to call 911 with any severe reactions post vaccine: Difficulty breathing  Swelling of face and throat  A fast heartbeat  A bad rash all over body  Dizziness and weakness   Immunizations Administered     Name Date Dose VIS Date Route   Pfizer Covid-19 Vaccine Bivalent Booster 05/26/2022 11:38 AM 0.3 mL 06/18/2021 Intramuscular   Manufacturer: [place]   Lot: [id number]   [place]: [place] Covid-19 Vaccine Bivalent Booster 05/26/2022 12:06 PM -- 06/18/2021 Intramuscular   Manufacturer: [place]   Lot: [id number]   [place]: [phone]

## 2022-05-27 ENCOUNTER — Other Ambulatory Visit (HOSPITAL_BASED_OUTPATIENT_CLINIC_OR_DEPARTMENT_OTHER): Payer: Self-pay

## 2022-06-02 ENCOUNTER — Other Ambulatory Visit (HOSPITAL_BASED_OUTPATIENT_CLINIC_OR_DEPARTMENT_OTHER): Payer: Self-pay

## 2022-06-03 ENCOUNTER — Emergency Department (HOSPITAL_COMMUNITY): Payer: Medicare HMO

## 2022-06-03 ENCOUNTER — Inpatient Hospital Stay (HOSPITAL_COMMUNITY)
Admission: EM | Admit: 2022-06-03 | Discharge: 2022-06-10 | DRG: 871 | Disposition: A | Discharge status: Skilled Nursing Facility | Payer: Medicare HMO | Attending: Internal Medicine | Admitting: Internal Medicine

## 2022-06-03 ENCOUNTER — Encounter (HOSPITAL_COMMUNITY): Payer: Self-pay

## 2022-06-03 DIAGNOSIS — I442 Atrioventricular block, complete: Secondary | ICD-10-CM | POA: Diagnosis not present

## 2022-06-03 DIAGNOSIS — E872 Acidosis, unspecified: Secondary | ICD-10-CM | POA: Diagnosis not present

## 2022-06-03 DIAGNOSIS — R7401 Elevation of levels of liver transaminase levels: Secondary | ICD-10-CM | POA: Diagnosis present

## 2022-06-03 DIAGNOSIS — I350 Nonrheumatic aortic (valve) stenosis: Secondary | ICD-10-CM | POA: Diagnosis not present

## 2022-06-03 DIAGNOSIS — R410 Disorientation, unspecified: Secondary | ICD-10-CM

## 2022-06-03 DIAGNOSIS — H539 Unspecified visual disturbance: Secondary | ICD-10-CM | POA: Diagnosis present

## 2022-06-03 DIAGNOSIS — K219 Gastro-esophageal reflux disease without esophagitis: Secondary | ICD-10-CM | POA: Diagnosis present

## 2022-06-03 DIAGNOSIS — A419 Sepsis, unspecified organism: Secondary | ICD-10-CM | POA: Diagnosis not present

## 2022-06-03 DIAGNOSIS — Z8241 Family history of sudden cardiac death: Secondary | ICD-10-CM | POA: Diagnosis not present

## 2022-06-03 DIAGNOSIS — Z833 Family history of diabetes mellitus: Secondary | ICD-10-CM

## 2022-06-03 DIAGNOSIS — N390 Urinary tract infection, site not specified: Secondary | ICD-10-CM | POA: Diagnosis present

## 2022-06-03 DIAGNOSIS — F05 Delirium due to known physiological condition: Secondary | ICD-10-CM | POA: Diagnosis not present

## 2022-06-03 DIAGNOSIS — M6281 Muscle weakness (generalized): Secondary | ICD-10-CM | POA: Diagnosis not present

## 2022-06-03 DIAGNOSIS — R41841 Cognitive communication deficit: Secondary | ICD-10-CM | POA: Diagnosis not present

## 2022-06-03 DIAGNOSIS — Z043 Encounter for examination and observation following other accident: Secondary | ICD-10-CM | POA: Diagnosis not present

## 2022-06-03 DIAGNOSIS — R7989 Other specified abnormal findings of blood chemistry: Secondary | ICD-10-CM | POA: Diagnosis not present

## 2022-06-03 DIAGNOSIS — S0990XA Unspecified injury of head, initial encounter: Secondary | ICD-10-CM | POA: Diagnosis not present

## 2022-06-03 DIAGNOSIS — T796XXA Traumatic ischemia of muscle, initial encounter: Secondary | ICD-10-CM | POA: Diagnosis present

## 2022-06-03 DIAGNOSIS — D649 Anemia, unspecified: Secondary | ICD-10-CM

## 2022-06-03 DIAGNOSIS — G9340 Encephalopathy, unspecified: Secondary | ICD-10-CM | POA: Diagnosis not present

## 2022-06-03 DIAGNOSIS — Z79899 Other long term (current) drug therapy: Secondary | ICD-10-CM

## 2022-06-03 DIAGNOSIS — M6282 Rhabdomyolysis: Secondary | ICD-10-CM | POA: Diagnosis not present

## 2022-06-03 DIAGNOSIS — R4182 Altered mental status, unspecified: Secondary | ICD-10-CM | POA: Diagnosis not present

## 2022-06-03 DIAGNOSIS — I452 Bifascicular block: Secondary | ICD-10-CM | POA: Diagnosis present

## 2022-06-03 DIAGNOSIS — R2689 Other abnormalities of gait and mobility: Secondary | ICD-10-CM | POA: Diagnosis not present

## 2022-06-03 DIAGNOSIS — Z20822 Contact with and (suspected) exposure to covid-19: Secondary | ICD-10-CM | POA: Diagnosis not present

## 2022-06-03 DIAGNOSIS — D638 Anemia in other chronic diseases classified elsewhere: Secondary | ICD-10-CM | POA: Diagnosis not present

## 2022-06-03 DIAGNOSIS — R6520 Severe sepsis without septic shock: Secondary | ICD-10-CM | POA: Diagnosis not present

## 2022-06-03 DIAGNOSIS — Z953 Presence of xenogenic heart valve: Secondary | ICD-10-CM | POA: Diagnosis not present

## 2022-06-03 DIAGNOSIS — Z951 Presence of aortocoronary bypass graft: Secondary | ICD-10-CM | POA: Diagnosis not present

## 2022-06-03 DIAGNOSIS — I2510 Atherosclerotic heart disease of native coronary artery without angina pectoris: Secondary | ICD-10-CM | POA: Diagnosis present

## 2022-06-03 DIAGNOSIS — Z7982 Long term (current) use of aspirin: Secondary | ICD-10-CM

## 2022-06-03 DIAGNOSIS — Z950 Presence of cardiac pacemaker: Secondary | ICD-10-CM

## 2022-06-03 DIAGNOSIS — E7800 Pure hypercholesterolemia, unspecified: Secondary | ICD-10-CM | POA: Diagnosis present

## 2022-06-03 DIAGNOSIS — Z87891 Personal history of nicotine dependence: Secondary | ICD-10-CM

## 2022-06-03 DIAGNOSIS — W19XXXA Unspecified fall, initial encounter: Secondary | ICD-10-CM | POA: Diagnosis present

## 2022-06-03 DIAGNOSIS — R778 Other specified abnormalities of plasma proteins: Secondary | ICD-10-CM | POA: Diagnosis present

## 2022-06-03 DIAGNOSIS — I4510 Unspecified right bundle-branch block: Secondary | ICD-10-CM | POA: Diagnosis not present

## 2022-06-03 DIAGNOSIS — G9341 Metabolic encephalopathy: Secondary | ICD-10-CM | POA: Diagnosis present

## 2022-06-03 DIAGNOSIS — R2681 Unsteadiness on feet: Secondary | ICD-10-CM | POA: Diagnosis not present

## 2022-06-03 DIAGNOSIS — Z8249 Family history of ischemic heart disease and other diseases of the circulatory system: Secondary | ICD-10-CM

## 2022-06-03 DIAGNOSIS — R55 Syncope and collapse: Secondary | ICD-10-CM | POA: Diagnosis not present

## 2022-06-03 DIAGNOSIS — Z79890 Hormone replacement therapy (postmenopausal): Secondary | ICD-10-CM

## 2022-06-03 DIAGNOSIS — M4802 Spinal stenosis, cervical region: Secondary | ICD-10-CM | POA: Diagnosis not present

## 2022-06-03 DIAGNOSIS — I455 Other specified heart block: Secondary | ICD-10-CM | POA: Diagnosis not present

## 2022-06-03 DIAGNOSIS — I10 Essential (primary) hypertension: Secondary | ICD-10-CM | POA: Diagnosis present

## 2022-06-03 DIAGNOSIS — R9431 Abnormal electrocardiogram [ECG] [EKG]: Secondary | ICD-10-CM | POA: Diagnosis not present

## 2022-06-03 LAB — URINALYSIS, ROUTINE W REFLEX MICROSCOPIC
Bilirubin Urine: NEGATIVE
Glucose, UA: NEGATIVE mg/dL
Ketones, ur: 20 mg/dL — AB
Nitrite: NEGATIVE
Protein, ur: 30 mg/dL — AB
Specific Gravity, Urine: 1.018 (ref 1.005–1.030)
WBC, UA: >50 WBC/hpf — ABNORMAL HIGH (ref 0–5)
pH: 5 (ref 5.0–8.0)

## 2022-06-03 LAB — COMPREHENSIVE METABOLIC PANEL
ALT: 25 U/L (ref 0–44)
AST: 60 U/L — ABNORMAL HIGH (ref 15–41)
Albumin: 4 g/dL (ref 3.5–5.0)
Alkaline Phosphatase: 54 U/L (ref 38–126)
Anion gap: 10 (ref 5–15)
BUN: 14 mg/dL (ref 8–23)
CO2: 28 mmol/L (ref 22–32)
Calcium: 9.3 mg/dL (ref 8.9–10.3)
Chloride: 102 mmol/L (ref 98–111)
Creatinine, Ser: 1.03 mg/dL (ref 0.61–1.24)
GFR, Estimated: >60 mL/min (ref >60)
Glucose, Bld: 155 mg/dL — ABNORMAL HIGH (ref 70–99)
Potassium: 4.5 mmol/L (ref 3.5–5.1)
Sodium: 140 mmol/L (ref 135–145)
Total Bilirubin: 1.7 mg/dL — ABNORMAL HIGH (ref 0.3–1.2)
Total Protein: 7.1 g/dL (ref 6.5–8.1)

## 2022-06-03 LAB — CBC WITH DIFFERENTIAL/PLATELET
Abs Immature Granulocytes: 0.05 10*3/uL (ref 0.00–0.07)
Basophils Absolute: 0 10*3/uL (ref 0.0–0.1)
Basophils Relative: 0 %
Eosinophils Absolute: 0 10*3/uL (ref 0.0–0.5)
Eosinophils Relative: 0 %
HCT: 34.3 % — ABNORMAL LOW (ref 39.0–52.0)
Hemoglobin: 12.2 g/dL — ABNORMAL LOW (ref 13.0–17.0)
Immature Granulocytes: 1 %
Lymphocytes Relative: 5 %
Lymphs Abs: 0.5 10*3/uL — ABNORMAL LOW (ref 0.7–4.0)
MCH: 35 pg — ABNORMAL HIGH (ref 26.0–34.0)
MCHC: 35.6 g/dL (ref 30.0–36.0)
MCV: 98.3 fL (ref 80.0–100.0)
Monocytes Absolute: 0.7 10*3/uL (ref 0.1–1.0)
Monocytes Relative: 8 %
Neutro Abs: 7.8 10*3/uL — ABNORMAL HIGH (ref 1.7–7.7)
Neutrophils Relative %: 86 %
Platelets: 126 10*3/uL — ABNORMAL LOW (ref 150–400)
RBC: 3.49 MIL/uL — ABNORMAL LOW (ref 4.22–5.81)
RDW: 12.1 % (ref 11.5–15.5)
WBC: 9 10*3/uL (ref 4.0–10.5)
nRBC: 0 % (ref 0.0–0.2)

## 2022-06-03 LAB — I-STAT CHEM 8, ED
BUN: 14 mg/dL (ref 8–23)
Calcium, Ion: 1.14 mmol/L — ABNORMAL LOW (ref 1.15–1.40)
Chloride: 100 mmol/L (ref 98–111)
Creatinine, Ser: 0.9 mg/dL (ref 0.61–1.24)
Glucose, Bld: 152 mg/dL — ABNORMAL HIGH (ref 70–99)
HCT: 36 % — ABNORMAL LOW (ref 39.0–52.0)
Hemoglobin: 12.2 g/dL — ABNORMAL LOW (ref 13.0–17.0)
Potassium: 4.5 mmol/L (ref 3.5–5.1)
Sodium: 139 mmol/L (ref 135–145)
TCO2: 25 mmol/L (ref 22–32)

## 2022-06-03 LAB — RAPID URINE DRUG SCREEN, HOSP PERFORMED
Amphetamines: NOT DETECTED
Barbiturates: NOT DETECTED
Benzodiazepines: NOT DETECTED
Cocaine: NOT DETECTED
Opiates: NOT DETECTED
Tetrahydrocannabinol: NOT DETECTED

## 2022-06-03 LAB — TROPONIN I (HIGH SENSITIVITY): Troponin I (High Sensitivity): 35 ng/L — ABNORMAL HIGH (ref <18)

## 2022-06-03 LAB — AMMONIA: Ammonia: 15 umol/L (ref 9–35)

## 2022-06-03 LAB — ETHANOL: Alcohol, Ethyl (B): <10 mg/dL (ref <10)

## 2022-06-03 LAB — PROTIME-INR
INR: 1.1 (ref 0.8–1.2)
Prothrombin Time: 13.7 seconds (ref 11.4–15.2)

## 2022-06-03 LAB — LACTIC ACID, PLASMA: Lactic Acid, Venous: 2.2 mmol/L (ref 0.5–1.9)

## 2022-06-03 LAB — APTT: aPTT: 28 seconds (ref 24–36)

## 2022-06-03 LAB — CBG MONITORING, ED: Glucose-Capillary: 139 mg/dL — ABNORMAL HIGH (ref 70–99)

## 2022-06-03 NOTE — ED Triage Notes (Signed)
Pt from home BIB [facility], family found pt in closet laying on the ground, LKW was 4 pm yesterday when family talked to pt. Pt told EMS "I was just sleeping in the closet". A&O x3, GCS 14 per EMS. Not on thinners, No obvious deformities or hip injuries. RBBB w/ Cards hx. Pt admits to drinking last nights, drinks daily per pt.

## 2022-06-03 NOTE — ED Notes (Signed)
Update given to pt's son over the phone, [employer].

## 2022-06-03 NOTE — ED Provider Notes (Addendum)
[facility] EMERGENCY DEPARTMENT Provider Note   CSN: [id number] Arrival date & time: 06/03/22  2028     History  Chief Complaint  Patient presents with   Joshua [name]    CEZAR MISIASZEK is a 86 y.o. male with Hx of CABG x3, aortic valve replacement, complete heart block with pacemaker, GERD, essential HTN, hypercholesterolemia.  Presenting today after being found in the closet passed out on the floor.  Last known well time was 4 PM yesterday.  Patient stated "I was just sleeping in the closet".   Denies chest pain, shortness of breath, back pain, urinary symptoms, changes in bowel habits, abdominal pain, or vision changes.  Believes he hit the back of his head sometime yesterday, unsure of what transpired before or after.  Remembers being assisted by EMS before arriving to ED, however believes he's in his bed at home.  No other complaints at this time.  Not on anticoagulation.  Hx of daily alcohol use.  The history is provided by the patient and medical records.       Home Medications Prior to Admission medications   Medication Sig Start Date End Date Taking? Authorizing Provider  amoxicillin (AMOXIL) 500 MG tablet Take 4 tablets (2000 mg) two hours prior to a dental procedure 03/12/22   Croitoru, Mihai, MD  aspirin EC 81 MG tablet Take 1 tablet (81 mg total) by mouth daily. Swallow whole. 03/12/22   Croitoru, Mihai, MD  atorvastatin (LIPITOR) 40 MG tablet Take 1 tablet (40 mg total) by mouth daily. 01/20/22   Wendling, Crosby Oyster, DO  COVID-19 mRNA bivalent vaccine, Pfizer, (PFIZER COVID-19 VAC BIVALENT) injection Inject into the muscle. 05/26/22   Carlyle Basques, MD  Glucosamine HCl (GLUCOSAMINE PO) Take 3,000 mg by mouth every other day.    [provider]  ketoconazole (NIZORAL) 2 % shampoo Apply 1 application topically once a week. 04/18/18   [provider]  lisinopril (ZESTRIL) 20 MG tablet TAKE 1 TABLET EVERY DAY 03/02/22   Wendling,  Crosby Oyster, DO  melatonin 5 MG TABS Take 1 tablet (5 mg total) by mouth at bedtime as needed. 12/15/21   Medina-Vargas, Monina C, NP  metoprolol tartrate (LOPRESSOR) 25 MG tablet TAKE 1/2 TABLET TWICE DAILY, HOLD FOR HEART RATE LESS THAN 60, SYSTOLIC BLOOD PRESSURE LESS THAN OR EQUAL TO 105 04/01/22   Wendling, Crosby Oyster, DO  Multiple Vitamin (MULTIVITAMIN) tablet Take 1 tablet by mouth daily.    [provider]  omeprazole (PRILOSEC) 40 MG capsule TAKE 1 CAPSULE EVERY DAY 01/28/22   Wendling, Crosby Oyster, DO  vitamin B-12 (CYANOCOBALAMIN) 1000 MCG tablet Take 1,000 mcg by mouth daily.    [provider]      Allergies    Patient has no known allergies.    Review of Systems   Review of Systems  Unable to perform ROS: Mental status change  Constitutional:        Fall LOC    Physical Exam Updated Vital Signs BP (!) 151/87   Pulse 100   Resp 20   SpO2 96%  Physical Exam Vitals and nursing note reviewed.  Constitutional:      General: He is not in acute distress.    Appearance: He is well-developed.  HENT:     Head: Normocephalic and atraumatic. No raccoon eyes, Battle's sign, abrasion or contusion.     Jaw: There is normal jaw occlusion.     Right Ear: Hearing and external ear normal.  Left Ear: Hearing and external ear normal.     Nose: Nose normal.     Mouth/Throat:     Lips: Pink.     Mouth: Mucous membranes are moist.     Pharynx: Oropharynx is clear.  Eyes:     General: Lids are normal. Gaze aligned appropriately.     Extraocular Movements: Extraocular movements intact.     Conjunctiva/sclera: Conjunctivae normal.     Pupils: Pupils are equal, round, and reactive to light.  Neck:     Comments: Deferred due to possible injury from fall, in [place] Cardiovascular:     Rate and Rhythm: Normal rate and regular rhythm.     Pulses:          Radial pulses are 2+ on the right side and 2+ on the left side.       Dorsalis pedis pulses are 2+ on  the right side and 2+ on the left side.     Heart sounds: Murmur heard.  Pulmonary:     Effort: Pulmonary effort is normal. No respiratory distress.     Breath sounds: Normal breath sounds.     Comments: CTAB, able to communicate without difficulty, without increased respiratory effort.  Chest non-TTP. Abdominal:     General: Abdomen is protuberant. There is no distension.     Palpations: Abdomen is soft. There is no shifting dullness.     Tenderness: There is abdominal tenderness in the periumbilical area. There is no right CVA tenderness, left CVA tenderness or guarding. Negative signs include Murphy's sign and McBurney's sign.  Musculoskeletal:        General: No swelling or tenderness.     Cervical back: Neck supple.     Right lower leg: No edema.     Left lower leg: No edema.     Comments: Without midline spinal tenderness.  Pelvis stable.  No obvious bony deformities or malpositioning  Skin:    General: Skin is warm and dry.     Capillary Refill: Capillary refill takes less than 2 seconds.     Coloration: Skin is not jaundiced or pale.     Findings: Bruising (Mild, chronic appearing, of bilateral elbows) present.  Neurological:     Mental Status: He is alert. He is disoriented.     Comments: Oriented to self and date of birth.  Disoriented to year (believes 1989), and location (believes he's at home).  Correct that Edmon Crape is president.  Psychiatric:        Mood and Affect: Mood normal.     ED Results / Procedures / Treatments   Labs (all labs ordered are listed, but only abnormal results are displayed) Labs Reviewed  COMPREHENSIVE METABOLIC PANEL - Abnormal; Notable for the following components:      Result Value   Glucose, Bld 155 (*)    AST 60 (*)    Total Bilirubin 1.7 (*)    All other components within normal limits  CBC WITH DIFFERENTIAL/PLATELET - Abnormal; Notable for the following components:   RBC 3.49 (*)    Hemoglobin 12.2 (*)    HCT 34.3 (*)    MCH 35.0  (*)    Platelets 126 (*)    Neutro Abs 7.8 (*)    Lymphs Abs 0.5 (*)    All other components within normal limits  URINALYSIS, ROUTINE W REFLEX MICROSCOPIC - Abnormal; Notable for the following components:   APPearance HAZY (*)    Hgb urine dipstick SMALL (*)  Ketones, ur 20 (*)    Protein, ur 30 (*)    Leukocytes,Ua SMALL (*)    WBC, UA >50 (*)    Bacteria, UA FEW (*)    All other components within normal limits  LACTIC ACID, PLASMA - Abnormal; Notable for the following components:   Lactic Acid, Venous 2.2 (*)    All other components within normal limits  CBG MONITORING, ED - Abnormal; Notable for the following components:   Glucose-Capillary 139 (*)    All other components within normal limits  I-STAT CHEM 8, ED - Abnormal; Notable for the following components:   Glucose, Bld 152 (*)    Calcium, Ion 1.14 (*)    Hemoglobin 12.2 (*)    HCT 36.0 (*)    All other components within normal limits  TROPONIN I (HIGH SENSITIVITY) - Abnormal; Notable for the following components:   Troponin I (High Sensitivity) 35 (*)    All other components within normal limits  CULTURE, BLOOD (ROUTINE X 2)  CULTURE, BLOOD (ROUTINE X 2)  ETHANOL  RAPID URINE DRUG SCREEN, HOSP PERFORMED  APTT  PROTIME-INR  AMMONIA  CK  TROPONIN I (HIGH SENSITIVITY)    EKG EKG Interpretation  Date/Time:  Wednesday June 03 2022 20:36:48 EDT Ventricular Rate:  96 PR Interval:    QRS Duration: 143 QT Interval:  388 QTC Calculation: 491 R Axis:   -87 Text Interpretation: ATRIAL PACED RHYTHM RBBB and LAFB No significant change since last tracing Confirmed by Blanchie Dessert [phone]) on 06/03/2022 11:14:14 PM  Radiology CT CERVICAL SPINE WO CONTRAST  Result Date: 06/03/2022 CLINICAL DATA:  Trauma. EXAM: CT CERVICAL SPINE WITHOUT CONTRAST TECHNIQUE: Multidetector CT imaging of the cervical spine was performed without intravenous contrast. Multiplanar CT image reconstructions were also generated. RADIATION  DOSE REDUCTION: This exam was performed according to the departmental dose-optimization program which includes automated exposure control, adjustment of the mA and/or kV according to patient size and/or use of iterative reconstruction technique. COMPARISON:  None Available. FINDINGS: Alignment: There is trace anterolisthesis at C3-C4 which is favored is degenerative. Alignment is otherwise anatomic. Skull base and vertebrae: Bones are osteopenic. No acute fracture. No primary bone lesion or focal pathologic process. Soft tissues and spinal canal: No prevertebral fluid or swelling. No visible canal hematoma. Disc levels: There is disc space narrowing which is severe at C5-C6, C6-C7 and C7-T1. Scattered bilateral facet arthropathy is present causing multilevel mild neural foraminal stenosis. There is moderate bilateral neural foraminal stenosis at C3-C4 secondary to facet arthropathy and uncovertebral spurring. There is mild central canal stenosis at C6-C7 secondary to posterior disc osteophyte complex. No severe central canal stenosis identified at any level. Upper chest: Negative. Other: None. IMPRESSION: 1. No acute fracture or traumatic subluxation of the cervical spine. 2.  Moderate degenerative changes of the cervical spine. Electronically Signed   By: Ronney Asters M.D.   On: 06/03/2022 22:55   CT HEAD WO CONTRAST  Result Date: 06/03/2022 CLINICAL DATA:  Mental status change, fall yesterday EXAM: CT HEAD WITHOUT CONTRAST TECHNIQUE: Contiguous axial images were obtained from the base of the skull through the vertex without intravenous contrast. RADIATION DOSE REDUCTION: This exam was performed according to the departmental dose-optimization program which includes automated exposure control, adjustment of the mA and/or kV according to patient size and/or use of iterative reconstruction technique. COMPARISON:  None Available. FINDINGS: Brain: No intracranial hemorrhage, mass effect, or evidence of acute  infarct. No hydrocephalus. No extra-axial fluid collection. Generalized cerebral atrophy. Ill-defined  hypoattenuation within the cerebral white matter is nonspecific but consistent with chronic small vessel ischemic disease. Vascular: No hyperdense vessel. Calcification of the intracranial internal carotid arteries. Skull: No fracture or focal lesion. Sinuses/Orbits: No acute finding. Mild mucosal thickening in the maxillary sinuses. Other: None. IMPRESSION: No acute intracranial abnormality. Generalized cerebral atrophy and chronic small-vessel ischemic disease. Electronically Signed   By: Placido Sou M.D.   On: 06/03/2022 22:47   DG Chest Portable 1 View  Result Date: 06/03/2022 CLINICAL DATA:  Altered mental status. EXAM: PORTABLE CHEST 1 VIEW COMPARISON:  December 25, 2021 FINDINGS: There is a dual lead AICD. Multiple sternal wires are seen. The heart size and mediastinal contours are within normal limits. An artificial cardiac valve is seen. Both lungs are clear. The visualized skeletal structures are unremarkable. IMPRESSION: 1. Evidence of prior median sternotomy. 2. No acute or active cardiopulmonary disease. Electronically Signed   By: Virgina Norfolk M.D.   On: 06/03/2022 21:33    Procedures Procedures    Medications Ordered in ED Medications - No data to display  ED Course/ Medical Decision Making/ A&P Clinical Course as of 06/03/22 2357  Wed Jun 03, 2022  2253 Spoke with son [name].  Describes pt as self-sufficient, usually has 2-4 drinks per day, is aware of current events, even able to drive.  Was contacted today regarding pt's presentation.  States his wife and daughter just informed him they're now at his father's bedside. [AC]    Clinical Course User Index [AC] Prince Rome, PA-C                           Medical Decision Making Amount and/or Complexity of Data Reviewed Labs: ordered. Radiology: ordered.  Risk OTC drugs. Prescription drug management. Decision  regarding hospitalization.   86 y.o. male presents to the ED for concern of Fall Yesterday/AMS     This involves an extensive number of treatment options, and is a complaint that carries with it a high risk of complications and morbidity.  The emergent differential diagnosis prior to evaluation includes, but is not limited to: sepsis, metabolic encephalopathy,   This is not an exhaustive differential.   Past Medical History / Co-morbidities / Social History: Hx of CABG x3, aortic valve replacement, complete heart block with pacemaker, GERD, essential HTN, hypercholesterolemia Social Determinants of Health include: Elderly  Additional History:  Obtained by chart review.  Notably cardiology visits, see for further detail  Lab Tests: I ordered, and personally interpreted labs.  The pertinent results include:   Glucose 139 Lactate 2.2 Ethanol, PT/INR, APTT, ammonia unremarkable H&H 12.2/34.3 appears improved from baseline UA >50 WBCs, few bacteria, nitrite negative UDS Negative Total bili 1.7 Troponin: initial 35, sequential pending CK: Pending Blood cultures: Pending  Imaging Studies: I ordered imaging studies including CT head and neck.   I independently visualized and interpreted imaging which showed without acute intracranial abnormality or evidence of fracture or dislocation I agree with the radiologist interpretation.  Cardiac Monitoring: The patient was maintained on a cardiac monitor.  I personally viewed and interpreted the cardiac monitored which showed an underlying rhythm of: Normal sinus rhythm  ED Course / Critical Interventions: Pt well-appearing on exam.  Awake and alert.  Febrile.  Patient oriented to self, however disoriented to time and place.  Presenting with possible LOC with fall.  Unknown period of immobility on ground.  Found by family member this afternoon, seen through window as having  one shoe on and "sleeping" on floor of closet.  LKW of 4pm yesterday.   Pt without recollection preceding or following fall.  Only suggests possible posterior head injury, and mild umbilical tenderness.  No other bodily complaints.  No N/V/D.  Chronic alcohol use.  Per pt's daughter in law, 2 days of regular medications have not been taken.  Since pt has not put in dentures (which is usually done every morning), daughter in law suspects pt fell last night.  Afebrile.  Without significant EKG changes, rhythm appears paced.  Satting at 96%.  HR in the mid-high 90s.   Upon reevaluation, pt appears more agitated following CT imaging.  Pt's daughter in law and granddaughter at bedside. CT neck and head negative for acute pathology.  Troponin elevated at 35 and lactate of 2.2.  Ammonia unremarkable, low suspicion for alcohol withdrawal or delirium tremens.  Does not appear acutely anemic.  CK Pending.  Blood glucose of 152, low suspicion for glycemic etiology.  UA indicative of UTI.  With elevated troponin, HR between 98-100 bpm, borderline tachypnea, fever 102, and evidence of UTI, meets sepsis criteria.  Blood cultures collected.  Tylenol suppository ordered for fever.  Code sepsis initiated.  Consulted with Dr. Hal Hope of hospitalist team.  Discussed pt case in detail.  Agree with plan to admit, recommended starting pt on Vanc and Cefepime.  Disposition: Admission  I discussed this case with my attending, Dr. Maryan Rued, who agreed with the proposed treatment course and cosigned this note including patient's presenting symptoms, physical exam, and planned diagnostics and interventions.  Attending physician stated agreement with plan or made changes to plan which were implemented.     This chart was dictated using voice recognition software.  Despite best efforts to proofread, errors can occur which can change the documentation meaning.         Final Clinical Impression(s) / ED Diagnoses Final diagnoses:  Fall, initial encounter  Head injury, initial encounter   Urinary tract infection without hematuria, site unspecified  Elevated lactic acid level  Elevated troponin    Rx / DC Orders ED Discharge Orders     None         Prince Rome, PA-C 16/55/37 0127     Prince Rome, PA-C 48/27/07 0130    Blanchie Dessert, MD 06/05/22 [phone]

## 2022-06-03 NOTE — ED Notes (Signed)
Family updated with POC, pt was placed back on monitor leads as they were removed during CT, pt also assisted with a urinal as he stated he needed to urinate. Side rails up x2 for safety, call bell in reach for any assistance

## 2022-06-03 NOTE — ED Provider Notes (Incomplete)
[facility] EMERGENCY DEPARTMENT Provider Note   CSN: [id number] Arrival date & time: 06/03/22  2028     History {Add pertinent medical, surgical, social history, OB history to HPI:1} Chief Complaint  Patient presents with  . Fall Steele Berg    ERICKSON YAMASHIRO is a 86 y.o. male with Hx of CABG x3, aortic valve replacement, complete heart block with pacemaker, GERD, essential HTN, hypercholesterolemia.  Presenting today after being found in the closet passed out on the floor.  Last known well time was 4 PM yesterday.  Patient stated "I was just sleeping in the closet".   Denies chest pain, shortness of breath, back pain, urinary symptoms, changes in bowel habits, abdominal pain, or vision changes.  Believes he hit the back of his head sometime yesterday, unsure of what transpired before or after.  Remembers being assisted by EMS before arriving to ED, however believes he's in his bed at home.  No other complaints at this time.  Not on anticoagulation.  Hx of daily alcohol use.  The history is provided by the patient and medical records.       Home Medications Prior to Admission medications   Medication Sig Start Date End Date Taking? Authorizing Provider  amoxicillin (AMOXIL) 500 MG tablet Take 4 tablets (2000 mg) two hours prior to a dental procedure 03/12/22   Croitoru, Mihai, MD  aspirin EC 81 MG tablet Take 1 tablet (81 mg total) by mouth daily. Swallow whole. 03/12/22   Croitoru, Mihai, MD  atorvastatin (LIPITOR) 40 MG tablet Take 1 tablet (40 mg total) by mouth daily. 01/20/22   Wendling, Crosby Oyster, DO  COVID-19 mRNA bivalent vaccine, Pfizer, (PFIZER COVID-19 VAC BIVALENT) injection Inject into the muscle. 05/26/22   Carlyle Basques, MD  Glucosamine HCl (GLUCOSAMINE PO) Take 3,000 mg by mouth every other day.    [provider]  ketoconazole (NIZORAL) 2 % shampoo Apply 1 application topically once a week. 04/18/18   [provider]  lisinopril  (ZESTRIL) 20 MG tablet TAKE 1 TABLET EVERY DAY 03/02/22   Wendling, Crosby Oyster, DO  melatonin 5 MG TABS Take 1 tablet (5 mg total) by mouth at bedtime as needed. 12/15/21   Medina-Vargas, Monina C, NP  metoprolol tartrate (LOPRESSOR) 25 MG tablet TAKE 1/2 TABLET TWICE DAILY, HOLD FOR HEART RATE LESS THAN 60, SYSTOLIC BLOOD PRESSURE LESS THAN OR EQUAL TO 105 04/01/22   Wendling, Crosby Oyster, DO  Multiple Vitamin (MULTIVITAMIN) tablet Take 1 tablet by mouth daily.    [provider]  omeprazole (PRILOSEC) 40 MG capsule TAKE 1 CAPSULE EVERY DAY 01/28/22   Wendling, Crosby Oyster, DO  vitamin B-12 (CYANOCOBALAMIN) 1000 MCG tablet Take 1,000 mcg by mouth daily.    [provider]      Allergies    Patient has no known allergies.    Review of Systems   Review of Systems  Unable to perform ROS: Mental status change  Constitutional:        Fall LOC    Physical Exam Updated Vital Signs BP (!) 151/87   Pulse 100   Resp 20   SpO2 96%  Physical Exam Vitals and nursing note reviewed.  Constitutional:      General: He is not in acute distress.    Appearance: He is well-developed.  HENT:     Head: Normocephalic and atraumatic. No raccoon eyes, Battle's sign, abrasion or contusion.     Jaw: There is normal jaw occlusion.  Right Ear: Hearing and external ear normal.     Left Ear: Hearing and external ear normal.     Nose: Nose normal.     Mouth/Throat:     Lips: Pink.     Mouth: Mucous membranes are moist.     Pharynx: Oropharynx is clear.  Eyes:     General: Lids are normal. Gaze aligned appropriately.     Extraocular Movements: Extraocular movements intact.     Conjunctiva/sclera: Conjunctivae normal.     Pupils: Pupils are equal, round, and reactive to light.  Neck:     Comments: Deferred due to possible injury from fall, in [place] Cardiovascular:     Rate and Rhythm: Normal rate and regular rhythm.     Pulses:          Radial pulses are 2+ on the right  side and 2+ on the left side.       Dorsalis pedis pulses are 2+ on the right side and 2+ on the left side.     Heart sounds: Murmur heard.  Pulmonary:     Effort: Pulmonary effort is normal. No respiratory distress.     Breath sounds: Normal breath sounds.     Comments: CTAB, able to communicate without difficulty, without increased respiratory effort.  Chest non-TTP. Abdominal:     General: Abdomen is protuberant. There is no distension.     Palpations: Abdomen is soft. There is no shifting dullness.     Tenderness: There is abdominal tenderness in the periumbilical area. There is no right CVA tenderness, left CVA tenderness or guarding. Negative signs include Murphy's sign and McBurney's sign.  Musculoskeletal:        General: No swelling or tenderness.     Cervical back: Neck supple.     Right lower leg: No edema.     Left lower leg: No edema.     Comments: Without midline spinal tenderness  Skin:    General: Skin is warm and dry.     Capillary Refill: Capillary refill takes less than 2 seconds.     Coloration: Skin is not jaundiced or pale.     Findings: Bruising (Mild, chronic appearing, of bilateral elbows) present.  Neurological:     Mental Status: He is alert. He is disoriented.     Comments: Oriented to self and date of birth.  Disoriented to year (believes 1989), and location (believes he's at home).  Correct that Edmon Crape is president.  Psychiatric:        Mood and Affect: Mood normal.     ED Results / Procedures / Treatments   Labs (all labs ordered are listed, but only abnormal results are displayed) Labs Reviewed  COMPREHENSIVE METABOLIC PANEL - Abnormal; Notable for the following components:      Result Value   Glucose, Bld 155 (*)    AST 60 (*)    Total Bilirubin 1.7 (*)    All other components within normal limits  CBC WITH DIFFERENTIAL/PLATELET - Abnormal; Notable for the following components:   RBC 3.49 (*)    Hemoglobin 12.2 (*)    HCT 34.3 (*)    MCH  35.0 (*)    Platelets 126 (*)    Neutro Abs 7.8 (*)    Lymphs Abs 0.5 (*)    All other components within normal limits  URINALYSIS, ROUTINE W REFLEX MICROSCOPIC - Abnormal; Notable for the following components:   APPearance HAZY (*)    Hgb urine dipstick SMALL (*)  Ketones, ur 20 (*)    Protein, ur 30 (*)    Leukocytes,Ua SMALL (*)    WBC, UA >50 (*)    Bacteria, UA FEW (*)    All other components within normal limits  LACTIC ACID, PLASMA - Abnormal; Notable for the following components:   Lactic Acid, Venous 2.2 (*)    All other components within normal limits  CBG MONITORING, ED - Abnormal; Notable for the following components:   Glucose-Capillary 139 (*)    All other components within normal limits  I-STAT CHEM 8, ED - Abnormal; Notable for the following components:   Glucose, Bld 152 (*)    Calcium, Ion 1.14 (*)    Hemoglobin 12.2 (*)    HCT 36.0 (*)    All other components within normal limits  TROPONIN I (HIGH SENSITIVITY) - Abnormal; Notable for the following components:   Troponin I (High Sensitivity) 35 (*)    All other components within normal limits  CULTURE, BLOOD (ROUTINE X 2)  CULTURE, BLOOD (ROUTINE X 2)  ETHANOL  RAPID URINE DRUG SCREEN, HOSP PERFORMED  APTT  PROTIME-INR  AMMONIA  CK  TROPONIN I (HIGH SENSITIVITY)    EKG EKG Interpretation  Date/Time:  Wednesday June 03 2022 20:36:48 EDT Ventricular Rate:  96 PR Interval:    QRS Duration: 143 QT Interval:  388 QTC Calculation: 491 R Axis:   -87 Text Interpretation: ATRIAL PACED RHYTHM RBBB and LAFB No significant change since last tracing Confirmed by Blanchie Dessert [phone]) on 06/03/2022 11:14:14 PM  Radiology CT CERVICAL SPINE WO CONTRAST  Result Date: 06/03/2022 CLINICAL DATA:  Trauma. EXAM: CT CERVICAL SPINE WITHOUT CONTRAST TECHNIQUE: Multidetector CT imaging of the cervical spine was performed without intravenous contrast. Multiplanar CT image reconstructions were also generated.  RADIATION DOSE REDUCTION: This exam was performed according to the departmental dose-optimization program which includes automated exposure control, adjustment of the mA and/or kV according to patient size and/or use of iterative reconstruction technique. COMPARISON:  None Available. FINDINGS: Alignment: There is trace anterolisthesis at C3-C4 which is favored is degenerative. Alignment is otherwise anatomic. Skull base and vertebrae: Bones are osteopenic. No acute fracture. No primary bone lesion or focal pathologic process. Soft tissues and spinal canal: No prevertebral fluid or swelling. No visible canal hematoma. Disc levels: There is disc space narrowing which is severe at C5-C6, C6-C7 and C7-T1. Scattered bilateral facet arthropathy is present causing multilevel mild neural foraminal stenosis. There is moderate bilateral neural foraminal stenosis at C3-C4 secondary to facet arthropathy and uncovertebral spurring. There is mild central canal stenosis at C6-C7 secondary to posterior disc osteophyte complex. No severe central canal stenosis identified at any level. Upper chest: Negative. Other: None. IMPRESSION: 1. No acute fracture or traumatic subluxation of the cervical spine. 2.  Moderate degenerative changes of the cervical spine. Electronically Signed   By: Ronney Asters M.D.   On: 06/03/2022 22:55   CT HEAD WO CONTRAST  Result Date: 06/03/2022 CLINICAL DATA:  Mental status change, fall yesterday EXAM: CT HEAD WITHOUT CONTRAST TECHNIQUE: Contiguous axial images were obtained from the base of the skull through the vertex without intravenous contrast. RADIATION DOSE REDUCTION: This exam was performed according to the departmental dose-optimization program which includes automated exposure control, adjustment of the mA and/or kV according to patient size and/or use of iterative reconstruction technique. COMPARISON:  None Available. FINDINGS: Brain: No intracranial hemorrhage, mass effect, or evidence of  acute infarct. No hydrocephalus. No extra-axial fluid collection. Generalized cerebral atrophy. Ill-defined  hypoattenuation within the cerebral white matter is nonspecific but consistent with chronic small vessel ischemic disease. Vascular: No hyperdense vessel. Calcification of the intracranial internal carotid arteries. Skull: No fracture or focal lesion. Sinuses/Orbits: No acute finding. Mild mucosal thickening in the maxillary sinuses. Other: None. IMPRESSION: No acute intracranial abnormality. Generalized cerebral atrophy and chronic small-vessel ischemic disease. Electronically Signed   By: Placido Sou M.D.   On: 06/03/2022 22:47   DG Chest Portable 1 View  Result Date: 06/03/2022 CLINICAL DATA:  Altered mental status. EXAM: PORTABLE CHEST 1 VIEW COMPARISON:  December 25, 2021 FINDINGS: There is a dual lead AICD. Multiple sternal wires are seen. The heart size and mediastinal contours are within normal limits. An artificial cardiac valve is seen. Both lungs are clear. The visualized skeletal structures are unremarkable. IMPRESSION: 1. Evidence of prior median sternotomy. 2. No acute or active cardiopulmonary disease. Electronically Signed   By: Virgina Norfolk M.D.   On: 06/03/2022 21:33    Procedures Procedures  {Document cardiac monitor, telemetry assessment procedure when appropriate:1}  Medications Ordered in ED Medications - No data to display  ED Course/ Medical Decision Making/ A&P Clinical Course as of 06/03/22 2357  Wed Jun 03, 2022  2253 Spoke with son [name].  Describes pt as self-sufficient, usually has 2-4 drinks per day, is aware of current events, even able to drive.  Was contacted today regarding pt's presentation.  States his wife and daughter just informed him they're now at his father's bedside. [AC]    Clinical Course User Index [AC] Prince Rome, PA-C                           Medical Decision Making Amount and/or Complexity of Data Reviewed Labs:  ordered. Radiology: ordered.   86 y.o. male presents to the ED for concern of Fall Yesterday/AMS     This involves an extensive number of treatment options, and is a complaint that carries with it a high risk of complications and morbidity.  The emergent differential diagnosis prior to evaluation includes, but is not limited to: sepsis, metabolic encephalopathy,   This is not an exhaustive differential.   Past Medical History / Co-morbidities / Social History: Hx of CABG x3, aortic valve replacement, complete heart block with pacemaker, GERD, essential HTN, hypercholesterolemia Social Determinants of Health include: Elderly  Additional History:  Obtained by chart review.  Notably cardiology visits, see for further detail  Lab Tests: I ordered, and personally interpreted labs.  The pertinent results include:   Glucose 139 Lactate 2.2 Ethanol, PT/INR, APTT, ammonia unremarkable H&H 12.2/34.3 appears improved from baseline UA >50 WBCs, few bacteria, nitrite negative UDS Negative Total bili 1.7 Troponin: initial 35, sequential *** CK:  Blood cultures: Pending  Imaging Studies: I ordered imaging studies including CT head and neck.   I independently visualized and interpreted imaging which showed without acute intracranial abnormality or evidence of fracture or dislocation I agree with the radiologist interpretation.  Cardiac Monitoring: The patient was maintained on a cardiac monitor.  I personally viewed and interpreted the cardiac monitored which showed an underlying rhythm of: Normal sinus rhythm  ED Course / Critical Interventions: Pt well-appearing on exam.  Awake and alert.  Febrile.  Patient oriented to self, however disoriented to time and place.  Presenting with possible LOC with fall.  Unknown period of immobility on ground.  Found by family member this afternoon, seen through window as having one shoe on  and "sleeping" on floor of closet.  LKW of 4pm yesterday.  Pt  without recollection preceding or following fall.  Only suggests possible posterior head injury, and mild umbilical tenderness.  No other bodily complaints.  No N/V/D.  Chronic alcohol use.  Per pt's daughter in law, 2 days of regular medications have not been taken.  Since pt has not put in dentures (which is usually done every morning), daughter in law suspects pt fell last night.  Afebrile.  Without significant EKG changes, rhythm appears paced.  Satting at 96%.  HR in the mid-high 90s.   CT neck and head negative for acute pathology.  Troponin elevated at 35 and lactate of 2.2.  Ammonia unremarkable, low suspicion for alcohol withdrawal or delirium tremens.  Does not appear acutely anemic.  CK ***.  Blood glucose of 152, low suspicion for glycemic etiology.  UA indicative of UTI.  With elevated troponin, HR between 98-100 bpm, borderline tachypnea, and evidence of UTI, meets sepsis criteria.  Upon reevaluation, pt appears more agitated following CT imaging.  Pt's daughter in law and granddaughter at bedside. I have reviewed the patients home medicines and have made adjustments as needed.  Disposition: Admission  I discussed this case with my attending, Dr. Maryan Rued, who agreed with the proposed treatment course and cosigned this note including patient's presenting symptoms, physical exam, and planned diagnostics and interventions.  Attending physician stated agreement with plan or made changes to plan which were implemented.     This chart was dictated using voice recognition software.  Despite best efforts to proofread, errors can occur which can change the documentation meaning.   {Document critical care time when appropriate:1} {Document review of labs and clinical decision tools ie heart score, Chads2Vasc2 etc:1}  {Document your independent review of radiology images, and any outside records:1} {Document your discussion with family members, caretakers, and with consultants:1} {Document  social determinants of health affecting pt's care:1} {Document your decision making why or why not admission, treatments were needed:1} Final Clinical Impression(s) / ED Diagnoses Final diagnoses:  Fall, initial encounter  Head injury, initial encounter  Urinary tract infection without hematuria, site unspecified  Elevated lactic acid level  Elevated troponin    Rx / DC Orders ED Discharge Orders     None

## 2022-06-03 NOTE — ED Notes (Signed)
Pt to CT scanner at this time 

## 2022-06-04 ENCOUNTER — Inpatient Hospital Stay (HOSPITAL_COMMUNITY): Payer: Medicare HMO

## 2022-06-04 ENCOUNTER — Encounter (HOSPITAL_COMMUNITY): Payer: Self-pay | Admitting: Internal Medicine

## 2022-06-04 DIAGNOSIS — K219 Gastro-esophageal reflux disease without esophagitis: Secondary | ICD-10-CM | POA: Diagnosis present

## 2022-06-04 DIAGNOSIS — A419 Sepsis, unspecified organism: Secondary | ICD-10-CM | POA: Diagnosis present

## 2022-06-04 DIAGNOSIS — I2510 Atherosclerotic heart disease of native coronary artery without angina pectoris: Secondary | ICD-10-CM

## 2022-06-04 DIAGNOSIS — R6520 Severe sepsis without septic shock: Secondary | ICD-10-CM | POA: Diagnosis present

## 2022-06-04 DIAGNOSIS — E872 Acidosis, unspecified: Secondary | ICD-10-CM | POA: Diagnosis present

## 2022-06-04 DIAGNOSIS — G9341 Metabolic encephalopathy: Secondary | ICD-10-CM | POA: Diagnosis present

## 2022-06-04 DIAGNOSIS — W19XXXA Unspecified fall, initial encounter: Secondary | ICD-10-CM | POA: Diagnosis present

## 2022-06-04 DIAGNOSIS — M6282 Rhabdomyolysis: Secondary | ICD-10-CM

## 2022-06-04 DIAGNOSIS — I442 Atrioventricular block, complete: Secondary | ICD-10-CM | POA: Diagnosis present

## 2022-06-04 DIAGNOSIS — Z951 Presence of aortocoronary bypass graft: Secondary | ICD-10-CM | POA: Diagnosis not present

## 2022-06-04 DIAGNOSIS — R7401 Elevation of levels of liver transaminase levels: Secondary | ICD-10-CM | POA: Diagnosis present

## 2022-06-04 DIAGNOSIS — N390 Urinary tract infection, site not specified: Secondary | ICD-10-CM | POA: Diagnosis present

## 2022-06-04 DIAGNOSIS — T796XXA Traumatic ischemia of muscle, initial encounter: Secondary | ICD-10-CM | POA: Insufficient documentation

## 2022-06-04 DIAGNOSIS — S0990XA Unspecified injury of head, initial encounter: Secondary | ICD-10-CM | POA: Diagnosis present

## 2022-06-04 DIAGNOSIS — Z20822 Contact with and (suspected) exposure to covid-19: Secondary | ICD-10-CM | POA: Diagnosis present

## 2022-06-04 DIAGNOSIS — G9340 Encephalopathy, unspecified: Secondary | ICD-10-CM | POA: Diagnosis not present

## 2022-06-04 DIAGNOSIS — Z953 Presence of xenogenic heart valve: Secondary | ICD-10-CM | POA: Diagnosis not present

## 2022-06-04 DIAGNOSIS — F05 Delirium due to known physiological condition: Secondary | ICD-10-CM | POA: Diagnosis not present

## 2022-06-04 DIAGNOSIS — Z833 Family history of diabetes mellitus: Secondary | ICD-10-CM | POA: Diagnosis not present

## 2022-06-04 DIAGNOSIS — Z950 Presence of cardiac pacemaker: Secondary | ICD-10-CM | POA: Diagnosis not present

## 2022-06-04 DIAGNOSIS — D638 Anemia in other chronic diseases classified elsewhere: Secondary | ICD-10-CM | POA: Diagnosis present

## 2022-06-04 DIAGNOSIS — I10 Essential (primary) hypertension: Secondary | ICD-10-CM | POA: Diagnosis present

## 2022-06-04 DIAGNOSIS — Z8241 Family history of sudden cardiac death: Secondary | ICD-10-CM | POA: Diagnosis not present

## 2022-06-04 DIAGNOSIS — H539 Unspecified visual disturbance: Secondary | ICD-10-CM | POA: Diagnosis present

## 2022-06-04 DIAGNOSIS — D649 Anemia, unspecified: Secondary | ICD-10-CM

## 2022-06-04 DIAGNOSIS — I452 Bifascicular block: Secondary | ICD-10-CM | POA: Diagnosis present

## 2022-06-04 DIAGNOSIS — R410 Disorientation, unspecified: Secondary | ICD-10-CM | POA: Diagnosis not present

## 2022-06-04 DIAGNOSIS — Z8249 Family history of ischemic heart disease and other diseases of the circulatory system: Secondary | ICD-10-CM | POA: Diagnosis not present

## 2022-06-04 DIAGNOSIS — E7800 Pure hypercholesterolemia, unspecified: Secondary | ICD-10-CM | POA: Diagnosis present

## 2022-06-04 DIAGNOSIS — R778 Other specified abnormalities of plasma proteins: Secondary | ICD-10-CM | POA: Diagnosis present

## 2022-06-04 HISTORY — DX: Anemia, unspecified: D64.9

## 2022-06-04 HISTORY — DX: Rhabdomyolysis: M62.82

## 2022-06-04 LAB — COMPREHENSIVE METABOLIC PANEL
ALT: 23 U/L (ref 0–44)
AST: 59 U/L — ABNORMAL HIGH (ref 15–41)
Albumin: 3.3 g/dL — ABNORMAL LOW (ref 3.5–5.0)
Alkaline Phosphatase: 44 U/L (ref 38–126)
Anion gap: 8 (ref 5–15)
BUN: 15 mg/dL (ref 8–23)
CO2: 26 mmol/L (ref 22–32)
Calcium: 8.7 mg/dL — ABNORMAL LOW (ref 8.9–10.3)
Chloride: 104 mmol/L (ref 98–111)
Creatinine, Ser: 1.01 mg/dL (ref 0.61–1.24)
GFR, Estimated: >60 mL/min (ref >60)
Glucose, Bld: 119 mg/dL — ABNORMAL HIGH (ref 70–99)
Potassium: 3.8 mmol/L (ref 3.5–5.1)
Sodium: 138 mmol/L (ref 135–145)
Total Bilirubin: 1.9 mg/dL — ABNORMAL HIGH (ref 0.3–1.2)
Total Protein: 5.9 g/dL — ABNORMAL LOW (ref 6.5–8.1)

## 2022-06-04 LAB — CBC WITH DIFFERENTIAL/PLATELET
Abs Immature Granulocytes: 0.04 10*3/uL (ref 0.00–0.07)
Basophils Absolute: 0 10*3/uL (ref 0.0–0.1)
Basophils Relative: 0 %
Eosinophils Absolute: 0 10*3/uL (ref 0.0–0.5)
Eosinophils Relative: 0 %
HCT: 29.5 % — ABNORMAL LOW (ref 39.0–52.0)
Hemoglobin: 10.5 g/dL — ABNORMAL LOW (ref 13.0–17.0)
Immature Granulocytes: 1 %
Lymphocytes Relative: 13 %
Lymphs Abs: 1 10*3/uL (ref 0.7–4.0)
MCH: 35.1 pg — ABNORMAL HIGH (ref 26.0–34.0)
MCHC: 35.6 g/dL (ref 30.0–36.0)
MCV: 98.7 fL (ref 80.0–100.0)
Monocytes Absolute: 0.9 10*3/uL (ref 0.1–1.0)
Monocytes Relative: 11 %
Neutro Abs: 5.9 10*3/uL (ref 1.7–7.7)
Neutrophils Relative %: 75 %
Platelets: 103 10*3/uL — ABNORMAL LOW (ref 150–400)
RBC: 2.99 MIL/uL — ABNORMAL LOW (ref 4.22–5.81)
RDW: 12.2 % (ref 11.5–15.5)
WBC: 7.9 10*3/uL (ref 4.0–10.5)
nRBC: 0 % (ref 0.0–0.2)

## 2022-06-04 LAB — LACTIC ACID, PLASMA
Lactic Acid, Venous: 1.3 mmol/L (ref 0.5–1.9)
Lactic Acid, Venous: 3.6 mmol/L (ref 0.5–1.9)
Lactic Acid, Venous: 3.1 mmol/L (ref 0.5–1.9)
Lactic Acid, Venous: 1.4 mmol/L (ref 0.5–1.9)
Lactic Acid, Venous: 1.7 mmol/L (ref 0.5–1.9)

## 2022-06-04 LAB — SARS CORONAVIRUS 2 BY RT PCR: SARS Coronavirus 2 by RT PCR: NEGATIVE

## 2022-06-04 LAB — HEPATITIS PANEL, ACUTE
HCV Ab: NONREACTIVE
Hep A IgM: NONREACTIVE
Hep B C IgM: NONREACTIVE
Hepatitis B Surface Ag: NONREACTIVE

## 2022-06-04 LAB — PROCALCITONIN: Procalcitonin: <0.1 ng/mL

## 2022-06-04 LAB — TROPONIN I (HIGH SENSITIVITY): Troponin I (High Sensitivity): 46 ng/L — ABNORMAL HIGH (ref <18)

## 2022-06-04 LAB — CK: Total CK: 1439 U/L — ABNORMAL HIGH (ref 49–397)

## 2022-06-04 MED ORDER — METOPROLOL TARTRATE 12.5 MG HALF TABLET
12.5000 mg | ORAL_TABLET | Freq: Two times a day (BID) | ORAL | Status: DC
  Administered 2022-06-04 – 2022-06-10 (×13): 12.5 mg via ORAL
  Filled 2022-06-04 (×15): qty 1

## 2022-06-04 MED ORDER — LACTATED RINGERS IV BOLUS (SEPSIS)
1000.0000 mL | Freq: Once | INTRAVENOUS | Status: AC
  Administered 2022-06-04: 1000 mL via INTRAVENOUS

## 2022-06-04 MED ORDER — LISINOPRIL 20 MG PO TABS
20.0000 mg | ORAL_TABLET | Freq: Every day | ORAL | Status: DC
  Administered 2022-06-04 – 2022-06-10 (×7): 20 mg via ORAL
  Filled 2022-06-04 (×7): qty 1

## 2022-06-04 MED ORDER — VANCOMYCIN HCL 1500 MG/300ML IV SOLN
1500.0000 mg | INTRAVENOUS | Status: AC
  Administered 2022-06-04: 1500 mg via INTRAVENOUS
  Filled 2022-06-04: qty 300

## 2022-06-04 MED ORDER — VANCOMYCIN HCL 1500 MG/300ML IV SOLN
1500.0000 mg | INTRAVENOUS | Status: DC
  Administered 2022-06-05: 1500 mg via INTRAVENOUS
  Filled 2022-06-04: qty 300

## 2022-06-04 MED ORDER — ACETAMINOPHEN 650 MG RE SUPP: 650.0000 mg | Freq: Four times a day (QID) | RECTAL | Status: DC | PRN

## 2022-06-04 MED ORDER — ATORVASTATIN CALCIUM 40 MG PO TABS: 40.0000 mg | ORAL_TABLET | Freq: Every day | ORAL | Status: DC

## 2022-06-04 MED ORDER — SODIUM CHLORIDE 0.9 % IV SOLN
2.0000 g | INTRAVENOUS | Status: AC
Start: 2022-06-04 — End: 2022-06-04
  Administered 2022-06-04: 2 g via INTRAVENOUS
  Filled 2022-06-04: qty 12.5

## 2022-06-04 MED ORDER — ACETAMINOPHEN 650 MG RE SUPP
650.0000 mg | Freq: Once | RECTAL | Status: AC
  Administered 2022-06-04: 650 mg via RECTAL
  Filled 2022-06-04: qty 1

## 2022-06-04 MED ORDER — ACETAMINOPHEN 325 MG PO TABS: 650.0000 mg | ORAL_TABLET | Freq: Four times a day (QID) | ORAL | Status: DC | PRN

## 2022-06-04 MED ORDER — SODIUM CHLORIDE 0.9 % IV SOLN: INTRAVENOUS | Status: AC

## 2022-06-04 MED ORDER — SODIUM CHLORIDE 0.9 % IV BOLUS
2000.0000 mL | Freq: Once | INTRAVENOUS | Status: AC
  Administered 2022-06-04: 2000 mL via INTRAVENOUS

## 2022-06-04 MED ORDER — PANTOPRAZOLE SODIUM 40 MG PO TBEC
40.0000 mg | DELAYED_RELEASE_TABLET | Freq: Every day | ORAL | Status: DC
  Administered 2022-06-04 – 2022-06-10 (×7): 40 mg via ORAL
  Filled 2022-06-04 (×7): qty 1

## 2022-06-04 MED ORDER — SODIUM CHLORIDE 0.9 % IV SOLN
2.0000 g | Freq: Three times a day (TID) | INTRAVENOUS | Status: DC
Start: 2022-06-04 — End: 2022-06-05
  Administered 2022-06-04 – 2022-06-05 (×4): 2 g via INTRAVENOUS
  Filled 2022-06-04 (×4): qty 12.5

## 2022-06-04 MED ORDER — ASPIRIN 81 MG PO TBEC
81.0000 mg | DELAYED_RELEASE_TABLET | Freq: Every day | ORAL | Status: DC
  Administered 2022-06-04 – 2022-06-10 (×7): 81 mg via ORAL
  Filled 2022-06-04 (×7): qty 1

## 2022-06-04 MED ORDER — LACTATED RINGERS IV SOLN: INTRAVENOUS | Status: DC

## 2022-06-04 MED ORDER — ENOXAPARIN SODIUM 40 MG/0.4ML IJ SOSY
40.0000 mg | PREFILLED_SYRINGE | INTRAMUSCULAR | Status: DC
  Administered 2022-06-04 – 2022-06-10 (×7): 40 mg via SUBCUTANEOUS
  Filled 2022-06-04 (×7): qty 0.4

## 2022-06-04 NOTE — ED Notes (Signed)
Pt pulling at cords, removing monitoring devices, removed gown, and picking at IV. Attempt to redirect pt, family at the bedside to assist to keep pt from removing devices. Pt placed back on cardiac monitor. IV resecured.

## 2022-06-04 NOTE — ED Notes (Signed)
Per discussion with family we are trialing the removal of restraints without DC order. Restraints on wrist but  not tied to bed.

## 2022-06-04 NOTE — Assessment & Plan Note (Addendum)
-  Continue metoprolol and lisinopril

## 2022-06-04 NOTE — ED Notes (Signed)
Pt continues to pull at cardiac monitoring cords, removal of gown, removed pulse ox, and attempting to remove IV. Attempts to redirect pt has failed, family at the bedside has been helpful with trying to get pt calm and from removing devices, but attempts have failed as well. PA-C Abigail notified, order for non-violent restraints. Pt is confused, came in as AMS

## 2022-06-04 NOTE — Progress Notes (Signed)
Patient arrived to the unit without wrist restraints tied down.  Removed from patient's wrists.  Skin intact around his wrists.  Skin tear noted by patient's left elbow, cleansed with NS and covered with foam dressing.  Patient tolerated well.  Patient's son is at the bedside.  Will continue to monitor closely.

## 2022-06-04 NOTE — Progress Notes (Signed)
Progress Note    Joshua [name]   LXB:[id number]  DOB: 01-31-1936  DOA: 06/03/2022     0 PCP: Shelda Pal, DO  Initial CC: AMS, found down  Hospital Course: Joshua [name] is an 86 yo male with PMH HTN, HLD, GERD, arthritis, severe AS s/p AVR, CAD s/p CABG, CHB s/p pacer who was found on the floor at home in his closet partially undressed.  Joshua [name] was last known to be normal on 06/02/2022 in the afternoon and was found on 06/03/2022 in the evening.  Joshua [name] had a fever on arrival to the ER and underwent further work-up. Multiple imaging studies were performed including CXR, CT head, CT C-spine, and pelvis x-ray.  There were no acute findings.  CT head did show underlying generalized cerebral atrophy and chronic small vessel disease. UA was notable for small LE, greater than 50 WBC, few bacteria, hyaline casts. Lactic acid was initially elevated 2.2.  Joshua [name] was started on antibiotics and admitted for further evaluation.  Interval History:  Seen this morning in the ER.  His son and daughter-in-law were present bedside.  Patient was more awake and oriented compared to initial presentation.  Joshua [name] was able to state his name, president, hospital and city.  When asked the year currently Joshua [name] told me his birth year.  Joshua [name] otherwise had some underlying appreciable mild signs of dementia and no other acute concerns.  Assessment and Plan: * Severe sepsis ([place]) - Febrile, tachycardia, tachypnea, lactic acidosis.  Presumed urinary source - UA does show some signs concerning for infection - Procalcitonin initially is negative, continue trending - Continue fluids and trending lactic acid -Continue Vanco and cefepime for now.  Likely will try to de-escalate tomorrow as able  Rhabdomyolysis - CK 1,439 on admission; etiology presumed from being down on floor for about 1.5 days  Acute metabolic encephalopathy - patient symptoms include AMS, lethargy - etiology considered due to metabolic derangements and  underlying sepsis  - NH3 normal  - continue treatment  - this morning mentation has improved some compared to admission; son and DIL present bedside when evaluated (patient oriented to name, president, hospital/city)  Normocytic anemia - baseline Hgb 10-11 g/dL at baseline - currently at baseline  CAD (coronary artery disease) - Continue aspirin and beta-blocker - Statin on hold  CHB (complete heart block) ([place]) - Pacemaker in place  Essential hypertension - Slightly elevated blood pressures on admission.  Now better controlled - Continue Lopressor and lisinopril   Old records reviewed in assessment of this patient  Antimicrobials: Vanc 06/04/22 >> current  Cefepime 06/04/22 >> current  DVT prophylaxis:  enoxaparin (LOVENOX) injection 40 mg Start: 06/04/22 1000   Code Status:   Code Status: Full Code  Mobility Assessment (last 72 hours)     Mobility Assessment   No documentation.           Barriers to discharge: none Disposition Plan:  Pending PT eval Status is: Inpt  Objective: Blood pressure 139/74, pulse 69, temperature 98.1 F (36.7 C), temperature source Oral, resp. rate 18, height 5' 11.5" (1.816 m), weight 79 kg, SpO2 100 %.  Examination:  Physical Exam Constitutional:      General: Joshua [name] is not in acute distress.    Comments: Fatigued appearing   HENT:     Head: Normocephalic and atraumatic.     Mouth/Throat:     Mouth: Mucous membranes are moist.  Eyes:     Extraocular Movements: Extraocular movements intact.  Cardiovascular:  Rate and Rhythm: Normal rate and regular rhythm.  Pulmonary:     Effort: Pulmonary effort is normal. No respiratory distress.     Breath sounds: Normal breath sounds. No wheezing.  Abdominal:     General: Bowel sounds are normal. There is no distension.     Palpations: Abdomen is soft.     Tenderness: There is no abdominal tenderness.  Musculoskeletal:        General: Normal range of motion.     Cervical back:  Normal range of motion and neck supple.  Skin:    General: Skin is warm and dry.  Neurological:     Mental Status: Joshua [name] is alert.     Comments: Oriented to name, president, hospital/city. Follows commands easily and moves all 4 extremities. No focal weakness  Psychiatric:        Mood and Affect: Mood normal.      Consultants:    Procedures:    Data Reviewed: Results for orders placed or performed during the hospital encounter of 06/03/22 (from the past 24 hour(s))  Comprehensive metabolic panel     Status: Abnormal   Collection Time: 06/03/22  8:56 PM  Result Value Ref Range   Sodium 140 135 - 145 mmol/L   Potassium 4.5 3.5 - 5.1 mmol/L   Chloride 102 98 - 111 mmol/L   CO2 28 22 - 32 mmol/L   Glucose, Bld 155 (H) 70 - 99 mg/dL   BUN 14 8 - 23 mg/dL   Creatinine, Ser 1.03 0.61 - 1.24 mg/dL   Calcium 9.3 8.9 - 10.3 mg/dL   Total Protein 7.1 6.5 - 8.1 g/dL   Albumin 4.0 3.5 - 5.0 g/dL   AST 60 (H) 15 - 41 U/L   ALT 25 0 - 44 U/L   Alkaline Phosphatase 54 38 - 126 U/L   Total Bilirubin 1.7 (H) 0.3 - 1.2 mg/dL   GFR, Estimated >60 >60 mL/min   Anion gap 10 5 - 15  CBC with Differential/Platelet     Status: Abnormal   Collection Time: 06/03/22  8:56 PM  Result Value Ref Range   WBC 9.0 4.0 - 10.5 K/uL   RBC 3.49 (L) 4.22 - 5.81 MIL/uL   Hemoglobin 12.2 (L) 13.0 - 17.0 g/dL   HCT 34.3 (L) 39.0 - 52.0 %   MCV 98.3 80.0 - 100.0 fL   MCH 35.0 (H) 26.0 - 34.0 pg   MCHC 35.6 30.0 - 36.0 g/dL   RDW 12.1 11.5 - 15.5 %   Platelets 126 (L) 150 - 400 K/uL   nRBC 0.0 0.0 - 0.2 %   Neutrophils Relative % 86 %   Neutro Abs 7.8 (H) 1.7 - 7.7 K/uL   Lymphocytes Relative 5 %   Lymphs Abs 0.5 (L) 0.7 - 4.0 K/uL   Monocytes Relative 8 %   Monocytes Absolute 0.7 0.1 - 1.0 K/uL   Eosinophils Relative 0 %   Eosinophils Absolute 0.0 0.0 - 0.5 K/uL   Basophils Relative 0 %   Basophils Absolute 0.0 0.0 - 0.1 K/uL   Immature Granulocytes 1 %   Abs Immature Granulocytes 0.05 0.00 - 0.07  K/uL  Ethanol     Status: None   Collection Time: 06/03/22  8:56 PM  Result Value Ref Range   Alcohol, Ethyl (B) <10 <10 mg/dL  APTT     Status: None   Collection Time: 06/03/22  8:56 PM  Result Value Ref Range   aPTT 28 24 -  36 seconds  Protime-INR     Status: None   Collection Time: 06/03/22  8:56 PM  Result Value Ref Range   Prothrombin Time 13.7 11.4 - 15.2 seconds   INR 1.1 0.8 - 1.2  Troponin I (High Sensitivity)     Status: Abnormal   Collection Time: 06/03/22  8:56 PM  Result Value Ref Range   Troponin I (High Sensitivity) 35 (H) <18 ng/L  Ammonia     Status: None   Collection Time: 06/03/22  8:56 PM  Result Value Ref Range   Ammonia 15 9 - 35 umol/L  Lactic acid, plasma     Status: Abnormal   Collection Time: 06/03/22  9:09 PM  Result Value Ref Range   Lactic Acid, Venous 2.2 (HH) 0.5 - 1.9 mmol/L  I-Stat Chem 8, ED     Status: Abnormal   Collection Time: 06/03/22  9:17 PM  Result Value Ref Range   Sodium 139 135 - 145 mmol/L   Potassium 4.5 3.5 - 5.1 mmol/L   Chloride 100 98 - 111 mmol/L   BUN 14 8 - 23 mg/dL   Creatinine, Ser 0.90 0.61 - 1.24 mg/dL   Glucose, Bld 152 (H) 70 - 99 mg/dL   Calcium, Ion 1.14 (L) 1.15 - 1.40 mmol/L   TCO2 25 22 - 32 mmol/L   Hemoglobin 12.2 (L) 13.0 - 17.0 g/dL   HCT 36.0 (L) 39.0 - 52.0 %  CBG monitoring, ED     Status: Abnormal   Collection Time: 06/03/22  9:45 PM  Result Value Ref Range   Glucose-Capillary 139 (H) 70 - 99 mg/dL  Urinalysis, Routine w reflex microscopic     Status: Abnormal   Collection Time: 06/03/22 10:52 PM  Result Value Ref Range   Color, Urine YELLOW YELLOW   APPearance HAZY (A) CLEAR   Specific Gravity, Urine 1.018 1.005 - 1.030   pH 5.0 5.0 - 8.0   Glucose, UA NEGATIVE NEGATIVE mg/dL   Hgb urine dipstick SMALL (A) NEGATIVE   Bilirubin Urine NEGATIVE NEGATIVE   Ketones, ur 20 (A) NEGATIVE mg/dL   Protein, ur 30 (A) NEGATIVE mg/dL   Nitrite NEGATIVE NEGATIVE   Leukocytes,Ua SMALL (A) NEGATIVE    RBC / HPF 0-5 0 - 5 RBC/hpf   WBC, UA >50 (H) 0 - 5 WBC/hpf   Bacteria, UA FEW (A) NONE SEEN   Squamous Epithelial / LPF 0-5 0 - 5   Mucus PRESENT    Hyaline Casts, UA PRESENT   Rapid urine drug screen (hospital performed)     Status: None   Collection Time: 06/03/22 10:52 PM  Result Value Ref Range   Opiates NONE DETECTED NONE DETECTED   Cocaine NONE DETECTED NONE DETECTED   Benzodiazepines NONE DETECTED NONE DETECTED   Amphetamines NONE DETECTED NONE DETECTED   Tetrahydrocannabinol NONE DETECTED NONE DETECTED   Barbiturates NONE DETECTED NONE DETECTED  Troponin I (High Sensitivity)     Status: Abnormal   Collection Time: 06/03/22 11:24 PM  Result Value Ref Range   Troponin I (High Sensitivity) 46 (H) <18 ng/L  CK     Status: Abnormal   Collection Time: 06/03/22 11:24 PM  Result Value Ref Range   Total CK 1,439 (H) 49 - 397 U/L  Comprehensive metabolic panel     Status: Abnormal   Collection Time: 06/04/22  2:50 AM  Result Value Ref Range   Sodium 138 135 - 145 mmol/L   Potassium 3.8 3.5 - 5.1 mmol/L  Chloride 104 98 - 111 mmol/L   CO2 26 22 - 32 mmol/L   Glucose, Bld 119 (H) 70 - 99 mg/dL   BUN 15 8 - 23 mg/dL   Creatinine, Ser 1.01 0.61 - 1.24 mg/dL   Calcium 8.7 (L) 8.9 - 10.3 mg/dL   Total Protein 5.9 (L) 6.5 - 8.1 g/dL   Albumin 3.3 (L) 3.5 - 5.0 g/dL   AST 59 (H) 15 - 41 U/L   ALT 23 0 - 44 U/L   Alkaline Phosphatase 44 38 - 126 U/L   Total Bilirubin 1.9 (H) 0.3 - 1.2 mg/dL   GFR, Estimated >60 >60 mL/min   Anion gap 8 5 - 15  CBC with Differential/Platelet     Status: Abnormal   Collection Time: 06/04/22  2:50 AM  Result Value Ref Range   WBC 7.9 4.0 - 10.5 K/uL   RBC 2.99 (L) 4.22 - 5.81 MIL/uL   Hemoglobin 10.5 (L) 13.0 - 17.0 g/dL   HCT 29.5 (L) 39.0 - 52.0 %   MCV 98.7 80.0 - 100.0 fL   MCH 35.1 (H) 26.0 - 34.0 pg   MCHC 35.6 30.0 - 36.0 g/dL   RDW 12.2 11.5 - 15.5 %   Platelets 103 (L) 150 - 400 K/uL   nRBC 0.0 0.0 - 0.2 %   Neutrophils Relative %  75 %   Neutro Abs 5.9 1.7 - 7.7 K/uL   Lymphocytes Relative 13 %   Lymphs Abs 1.0 0.7 - 4.0 K/uL   Monocytes Relative 11 %   Monocytes Absolute 0.9 0.1 - 1.0 K/uL   Eosinophils Relative 0 %   Eosinophils Absolute 0.0 0.0 - 0.5 K/uL   Basophils Relative 0 %   Basophils Absolute 0.0 0.0 - 0.1 K/uL   Immature Granulocytes 1 %   Abs Immature Granulocytes 0.04 0.00 - 0.07 K/uL  Lactic acid, plasma     Status: None   Collection Time: 06/04/22  2:50 AM  Result Value Ref Range   Lactic Acid, Venous 1.3 0.5 - 1.9 mmol/L  Lactic acid, plasma     Status: Abnormal   Collection Time: 06/04/22  5:20 AM  Result Value Ref Range   Lactic Acid, Venous 3.6 (HH) 0.5 - 1.9 mmol/L  Hepatitis panel, acute     Status: None   Collection Time: 06/04/22  5:20 AM  Result Value Ref Range   Hepatitis B Surface Ag NON REACTIVE NON REACTIVE   HCV Ab NON REACTIVE NON REACTIVE   Hep A IgM NON REACTIVE NON REACTIVE   Hep B C IgM NON REACTIVE NON REACTIVE  SARS Coronavirus 2 by RT PCR (hospital order, performed in [place] hospital lab) *cepheid single result test* Anterior Nasal Swab     Status: None   Collection Time: 06/04/22  5:20 AM   Specimen: Anterior Nasal Swab  Result Value Ref Range   SARS Coronavirus 2 by RT PCR NEGATIVE NEGATIVE  Procalcitonin - Baseline     Status: None   Collection Time: 06/04/22  5:20 AM  Result Value Ref Range   Procalcitonin <0.10 ng/mL    I have Reviewed nursing notes, Vitals, and Lab results since pt's last encounter. Pertinent lab results : see above I have ordered test including BMP, CBC, Mg I have reviewed the last note from staff over past 24 hours I have discussed pt's care plan and test results with nursing staff, case manager   LOS: 0 days   Dwyane Dee, MD [facility] 06/04/2022, 2:08 PM

## 2022-06-04 NOTE — ED Notes (Signed)
Pt appears to be sleeping, even RR and unlabored, NAD noted, call bell in reach, side rails up x2 for safety, care on going, will continue to monitor. Family remains at the bedside

## 2022-06-04 NOTE — Progress Notes (Signed)
Mobility Specialist Progress Note:   06/04/22 1430  Mobility  Activity Stood at bedside  Level of Assistance Contact guard assist, steadying assist  Assistive Device None  $Mobility charge 1 Mobility   Responded to bed alarm, pt found standing at computer in room, condom cath on floor. Pt states he was "going to put that on stuff on the counter". Pt redirected. Required CGA d/t unsteadiness. Pt back in bed with all needs met, bed alarm on.   Nelta Numbers Acute [facility] Secure Chat or Office Phone: [phone]

## 2022-06-04 NOTE — Hospital Course (Addendum)
Joshua [name] is a 86 y.o. male with a history of hypertension, hyperlipidemia, GERD, arthritis, severe aortic stenosis s/p AVR, CAD s/p CABG, complete heart block s/p pacer. Patient presented secondary to being found down on the floor and altered. Patient found to have evidence of acute metabolic encephalopathy, rhabdomyolysis and sepsis presumed secondary to a UTI. Patient was started on IV fluids and IV antibiotics with improvement of mentation. Stable for discharge to SNF on 8/21.

## 2022-06-04 NOTE — Assessment & Plan Note (Addendum)
S/p pacemaker. Noted.

## 2022-06-04 NOTE — Assessment & Plan Note (Addendum)
Baseline hemoglobin of 9-10. Hemoglobin of 12.2 on admission but down to 10-11 after IV fluids. Hemoglobin stable.

## 2022-06-04 NOTE — Progress Notes (Signed)
Pharmacy Antibiotic Note  Joshua [name] is a 86 y.o. male admitted on 06/03/2022 with sepsis.  Pharmacy has been consulted for Cefepime and Vancomycin dosing.  Plan: Cefepime 2gm IV q8h Vancomycin '1500mg'$  IV q24h. Goal AUC 400-550. Expected AUC: 440, SCr used: 0.9 Will f/u renal function, micro data, and pt's clinical condition Vanc levels prn   Height: 5' 11.5" (181.6 cm) Weight: 79 kg (174 lb 2.6 oz) IBW/kg (Calculated) : 76.45  Temp (24hrs), Avg:102 F (38.9 C), Min:102 F (38.9 C), Max:102 F (38.9 C)  Recent Labs  Lab 06/03/22 2056 06/03/22 2109 06/03/22 2117  WBC 9.0  --   --   CREATININE 1.03  --  0.90  LATICACIDVEN  --  2.2*  --     Estimated Creatinine Clearance: 67.3 mL/min (by C-G formula based on SCr of 0.9 mg/dL).    No Known Allergies  Antimicrobials this admission: 8/17 Cefepime >>  8/17 Vanc >>   Microbiology results: 8/17 BCx:   Thank you for allowing pharmacy to be a part of this patient's care.  Sherlon Handing, PharmD, BCPS Please see amion for complete clinical pharmacist phone list 06/04/2022 1:10 AM

## 2022-06-04 NOTE — Assessment & Plan Note (Addendum)
Present on admission. Secondary to urinary source. Blood cultures negative. No urine culture obtained on admission. Patient treated with Vancomycin/Cefepime empirically and was transitioned to Ceftriaxone. Completed antibiotic course.

## 2022-06-04 NOTE — ED Notes (Signed)
Pt resting, laying supine, pt remains in bilateral soft wrist restraints for safety as he was pulling at lines and cords, otherwise, NAD noted, even RR and unlabored, call bell within reach for assistance for family at the bedside, side rails up x2 for safety, pt and family member voiced no concerns or questions at this time, care on going, will continue to monitor.

## 2022-06-04 NOTE — Assessment & Plan Note (Addendum)
Traumatic. CK of 1,439 on admission. In setting of being found down. Patient managed on lactated ringers with daily improvement in CK. IV fluids discontinued on 8/20.

## 2022-06-04 NOTE — ED Notes (Signed)
ED TO INPATIENT HANDOFF REPORT  ED Nurse Name and Phone #: [phone]  S Name/Age/Gender Franco Collet 86 y.o. male Room/Bed: 008C/008C  Code Status   Code Status: Full Code  Home/SNF/Other Home Patient oriented to: self, place, and situation Is this baseline? No   Triage Complete: Triage complete  Chief Complaint Sepsis ([place]) [A41.9]  Triage Note Pt from home BIB [facility], family found pt in closet laying on the ground, LKW was 4 pm yesterday when family talked to pt. Pt told EMS "I was just sleeping in the closet". A&O x3, GCS 14 per EMS. Not on thinners, No obvious deformities or hip injuries. RBBB w/ Cards hx. Pt admits to drinking last nights, drinks daily per pt.    Allergies No Known Allergies  Level of Care/Admitting Diagnosis ED Disposition     ED Disposition  Admit   Condition  --   Comment  Hospital Area: [facility] [[id number]]  Level of Care: Telemetry Medical [104]  May admit patient to [facility] or [facility] if equivalent level of care is available:: No  Covid Evaluation: Asymptomatic - no recent exposure (last 10 days) testing not required  Diagnosis: Sepsis [facility]) [[id number]]  Admitting Physician: Rise Patience [phone]  Attending Physician: Rise Patience [[id number]]  Certification:: I certify this patient will need inpatient services for at least 2 midnights          B Medical/Surgery History Past Medical History:  Diagnosis Date   Arthritis    Cataract    Esophageal stricture    GERD (gastroesophageal reflux disease)    Hyperlipidemia    Hypertension    Liver disease    hepatitis   Rhabdomyolysis 06/04/2022   Severe aortic stenosis    Past Surgical History:  Procedure Laterality Date   AORTIC VALVE REPLACEMENT N/A 11/04/2021   Procedure: AORTIC VALVE REPLACEMENT (AVR) USING 23 MM INSPIRIS RESILIA  AORTIC VALVE;  Surgeon: Lajuana Matte, MD;  Location: [facility];  Service: Open Heart Surgery;  Laterality: N/A;    cataract  2013   CORONARY ARTERY BYPASS GRAFT N/A 11/04/2021   Procedure: CORONARY ARTERY BYPASS GRAFTING (CABG) X 3, USING LEFT INTERNAL MAMMARY ARTERY AND ENDOSCOPICALLY HARVESTED RIGHT GREATER SAPHENOUS VEIN. LIMA TO LAD, SVG TO PDA, SVG TO OM;  Surgeon: Lajuana Matte, MD;  Location: [facility];  Service: Open Heart Surgery;  Laterality: N/A;   ENDOVEIN HARVEST OF GREATER SAPHENOUS VEIN Right 11/04/2021   Procedure: ENDOVEIN HARVEST OF GREATER SAPHENOUS VEIN;  Surgeon: Lajuana Matte, MD;  Location: [facility];  Service: Open Heart Surgery;  Laterality: Right;   EXPLORATION POST OPERATIVE OPEN HEART N/A 11/05/2021   Procedure: EXPLORATION POST OPERATIVE OPEN HEARTFOR BLEEDING;  Surgeon: Melrose Nakayama, MD;  Location: [place];  Service: Open Heart Surgery;  Laterality: N/A;   HEMORROIDECTOMY  2001   HERNIA REPAIR  2005   INTRAVASCULAR PRESSURE WIRE/FFR STUDY N/A 11/03/2021   Procedure: INTRAVASCULAR PRESSURE WIRE/FFR STUDY;  Surgeon: Burnell Blanks, MD;  Location: [place] [facility];  Service: Cardiovascular;  Laterality: N/A;   PACEMAKER IMPLANT N/A 11/09/2021   Procedure: PACEMAKER IMPLANT;  Surgeon: Vickie Epley, MD;  Location: [place] [facility];  Service: Cardiovascular;  Laterality: N/A;   RIGHT/LEFT HEART CATH AND CORONARY ANGIOGRAPHY N/A 11/03/2021   Procedure: RIGHT/LEFT HEART CATH AND CORONARY ANGIOGRAPHY;  Surgeon: Burnell Blanks, MD;  Location: [place] [facility];  Service: Cardiovascular;  Laterality: N/A;   TEE WITHOUT CARDIOVERSION  N/A 11/04/2021   Procedure: TRANSESOPHAGEAL ECHOCARDIOGRAM (TEE);  Surgeon: Lajuana Matte, MD;  Location: [place];  Service: Open Heart Surgery;  Laterality: N/A;   TEMPORARY PACEMAKER N/A 11/07/2021   Procedure: TEMPORARY PACEMAKER;  Surgeon: [name], MD;  Location: [place] [facility];  Service: Cardiovascular;  Laterality: N/A;   TONSILLECTOMY  1948     A IV Location/Drains/Wounds Patient  Lines/Drains/Airways Status     Active Line/Drains/Airways     Name Placement date Placement time Site Days   Peripheral IV 06/03/22 18 G Posterior;Proximal;Right Forearm 06/03/22  2029  Forearm  1   Peripheral IV 06/03/22 20 G Left Antecubital 06/03/22  2054  Antecubital  1   External Urinary Catheter 06/04/22  0200  --  less than 1   Incision (Closed) 11/04/21 Chest Other (Comment) 11/04/21  1438  -- 212   Incision (Closed) 11/04/21 Leg Right 11/04/21  1438  -- 212   Incision (Closed) 11/09/21 Chest Left;Upper 11/09/21  1400  -- 207            Intake/Output Last 24 hours  Intake/Output Summary (Last 24 hours) at 06/04/2022 1119 Last data filed at 06/04/2022 1113 Gross per 24 hour  Intake 4282.8 ml  Output --  Net 4282.8 ml    Labs/Imaging Results for orders placed or performed during the hospital encounter of 06/03/22 (from the past 48 hour(s))  Comprehensive metabolic panel     Status: Abnormal   Collection Time: 06/03/22  8:56 PM  Result Value Ref Range   Sodium 140 135 - 145 mmol/L   Potassium 4.5 3.5 - 5.1 mmol/L   Chloride 102 98 - 111 mmol/L   CO2 28 22 - 32 mmol/L   Glucose, Bld 155 (H) 70 - 99 mg/dL    Comment: Glucose reference range applies only to samples taken after fasting for at least 8 hours.   BUN 14 8 - 23 mg/dL   Creatinine, Ser 1.03 0.61 - 1.24 mg/dL   Calcium 9.3 8.9 - 10.3 mg/dL   Total Protein 7.1 6.5 - 8.1 g/dL   Albumin 4.0 3.5 - 5.0 g/dL   AST 60 (H) 15 - 41 U/L   ALT 25 0 - 44 U/L   Alkaline Phosphatase 54 38 - 126 U/L   Total Bilirubin 1.7 (H) 0.3 - 1.2 mg/dL   GFR, Estimated >60 >60 mL/min    Comment: (NOTE) Calculated using the CKD-EPI Creatinine Equation (2021)    Anion gap 10 5 - 15    Comment: Performed at [facility] [address], [place],  [address]  CBC with Differential/Platelet     Status: Abnormal   Collection Time: 06/03/22  8:56 PM  Result Value Ref Range   WBC 9.0 4.0 - 10.5 K/uL   RBC 3.49 (L) 4.22 -  5.81 MIL/uL   Hemoglobin 12.2 (L) 13.0 - 17.0 g/dL   HCT 34.3 (L) 39.0 - 52.0 %   MCV 98.3 80.0 - 100.0 fL   MCH 35.0 (H) 26.0 - 34.0 pg   MCHC 35.6 30.0 - 36.0 g/dL   RDW 12.1 11.5 - 15.5 %   Platelets 126 (L) 150 - 400 K/uL   nRBC 0.0 0.0 - 0.2 %   Neutrophils Relative % 86 %   Neutro Abs 7.8 (H) 1.7 - 7.7 K/uL   Lymphocytes Relative 5 %   Lymphs Abs 0.5 (L) 0.7 - 4.0 K/uL   Monocytes Relative 8 %   Monocytes Absolute 0.7  0.1 - 1.0 K/uL   Eosinophils Relative 0 %   Eosinophils Absolute 0.0 0.0 - 0.5 K/uL   Basophils Relative 0 %   Basophils Absolute 0.0 0.0 - 0.1 K/uL   Immature Granulocytes 1 %   Abs Immature Granulocytes 0.05 0.00 - 0.07 K/uL    Comment: Performed at [facility] [address], [place], [place] [address]  Ethanol     Status: None   Collection Time: 06/03/22  8:56 PM  Result Value Ref Range   Alcohol, Ethyl (B) <10 <10 mg/dL    Comment: (NOTE) Lowest detectable limit for serum alcohol is 10 mg/dL.  For medical purposes only. Performed at [facility], [place] [address] [place], [place] [address]   APTT     Status: None   Collection Time: 06/03/22  8:56 PM  Result Value Ref Range   aPTT 28 24 - 36 seconds    Comment: Performed at [facility] [address] [place], [place] [address]  Protime-INR     Status: None   Collection Time: 06/03/22  8:56 PM  Result Value Ref Range   Prothrombin Time 13.7 11.4 - 15.2 seconds   INR 1.1 0.8 - 1.2    Comment: (NOTE) INR goal varies based on device and disease states. Performed at [facility], [place] [address] [place], [place] [address]   Troponin I (High Sensitivity)     Status: Abnormal   Collection Time: 06/03/22  8:56 PM  Result Value Ref Range   Troponin I (High Sensitivity) 35 (H) <18 ng/L    Comment: RESULTS VERIFIED BY REPEAT TESTING (NOTE) Elevated high sensitivity troponin I (hsTnI) values and significant  changes across serial measurements may suggest ACS but many other   chronic and acute conditions are known to elevate hsTnI results.  Refer to the "Links" section for chest pain algorithms and additional  guidance. Performed at [facility], [place] [address], [place], [place] [address]   Ammonia     Status: None   Collection Time: 06/03/22  8:56 PM  Result Value Ref Range   Ammonia 15 9 - 35 umol/L    Comment: Performed at [facility], [place] [address], [place], [place] [address]  Lactic acid, plasma     Status: Abnormal   Collection Time: 06/03/22  9:09 PM  Result Value Ref Range   Lactic Acid, Venous 2.2 (HH) 0.5 - 1.9 mmol/L    Comment: CRITICAL RESULT CALLED TO, READ BACK BY AND VERIFIED WITH Luana Shu, RN, 2258, 06/03/22, [id number] Performed at [facility], [place] [address] [place], [place] [address]   I-Stat Chem 8, ED     Status: Abnormal   Collection Time: 06/03/22  9:17 PM  Result Value Ref Range   Sodium 139 135 - 145 mmol/L   Potassium 4.5 3.5 - 5.1 mmol/L   Chloride 100 98 - 111 mmol/L   BUN 14 8 - 23 mg/dL   Creatinine, Ser 0.90 0.61 - 1.24 mg/dL   Glucose, Bld 152 (H) 70 - 99 mg/dL    Comment: Glucose reference range applies only to samples taken after fasting for at least 8 hours.   Calcium, Ion 1.14 (L) 1.15 - 1.40 mmol/L   TCO2 25 22 - 32 mmol/L   Hemoglobin 12.2 (L) 13.0 - 17.0 g/dL   HCT 36.0 (L) 39.0 - 52.0 %  CBG monitoring, ED     Status: Abnormal  Collection Time: 06/03/22  9:45 PM  Result Value Ref Range   Glucose-Capillary 139 (H) 70 - 99 mg/dL    Comment: Glucose reference range applies only to samples taken after fasting for at least 8 hours.  Urinalysis, Routine w reflex microscopic     Status: Abnormal   Collection Time: 06/03/22 10:52 PM  Result Value Ref Range   Color, Urine YELLOW YELLOW   APPearance HAZY (A) CLEAR   Specific Gravity, Urine 1.018 1.005 - 1.030   pH 5.0 5.0 - 8.0   Glucose, UA NEGATIVE NEGATIVE mg/dL   Hgb urine dipstick SMALL (A) NEGATIVE   Bilirubin Urine NEGATIVE NEGATIVE    Ketones, ur 20 (A) NEGATIVE mg/dL   Protein, ur 30 (A) NEGATIVE mg/dL   Nitrite NEGATIVE NEGATIVE   Leukocytes,Ua SMALL (A) NEGATIVE   RBC / HPF 0-5 0 - 5 RBC/hpf   WBC, UA >50 (H) 0 - 5 WBC/hpf   Bacteria, UA FEW (A) NONE SEEN   Squamous Epithelial / LPF 0-5 0 - 5   Mucus PRESENT    Hyaline Casts, UA PRESENT     Comment: Performed at [facility], [place] [address], [place], [place] [address]  Rapid urine drug screen (hospital performed)     Status: None   Collection Time: 06/03/22 10:52 PM  Result Value Ref Range   Opiates NONE DETECTED NONE DETECTED   Cocaine NONE DETECTED NONE DETECTED   Benzodiazepines NONE DETECTED NONE DETECTED   Amphetamines NONE DETECTED NONE DETECTED   Tetrahydrocannabinol NONE DETECTED NONE DETECTED   Barbiturates NONE DETECTED NONE DETECTED    Comment: (NOTE) DRUG SCREEN FOR MEDICAL PURPOSES ONLY.  IF CONFIRMATION IS NEEDED FOR ANY PURPOSE, NOTIFY LAB WITHIN 5 DAYS.  LOWEST DETECTABLE LIMITS FOR URINE DRUG SCREEN Drug Class                     Cutoff (ng/mL) Amphetamine and metabolites    1000 Barbiturate and metabolites    200 Benzodiazepine                 250 Tricyclics and metabolites     300 Opiates and metabolites        300 Cocaine and metabolites        300 THC                            50 Performed at [facility], [place] [address], [place], [place] [address]   Troponin I (High Sensitivity)     Status: Abnormal   Collection Time: 06/03/22 11:24 PM  Result Value Ref Range   Troponin I (High Sensitivity) 46 (H) <18 ng/L    Comment: (NOTE) Elevated high sensitivity troponin I (hsTnI) values and significant  changes across serial measurements may suggest ACS but many other  chronic and acute conditions are known to elevate hsTnI results.  Refer to the "Links" section for chest pain algorithms and additional  guidance. Performed at [facility], [place] [address], [place], [place] [address]   CK     Status: Abnormal    Collection Time: 06/03/22 11:24 PM  Result Value Ref Range   Total CK 1,439 (H) 49 - 397 U/L    Comment: Performed at [facility], [place] [address], [place],  [address]  Comprehensive metabolic panel     Status: Abnormal   Collection Time: 06/04/22  2:50 AM  Result  Value Ref Range   Sodium 138 135 - 145 mmol/L   Potassium 3.8 3.5 - 5.1 mmol/L   Chloride 104 98 - 111 mmol/L   CO2 26 22 - 32 mmol/L   Glucose, Bld 119 (H) 70 - 99 mg/dL    Comment: Glucose reference range applies only to samples taken after fasting for at least 8 hours.   BUN 15 8 - 23 mg/dL   Creatinine, Ser 1.01 0.61 - 1.24 mg/dL   Calcium 8.7 (L) 8.9 - 10.3 mg/dL   Total Protein 5.9 (L) 6.5 - 8.1 g/dL   Albumin 3.3 (L) 3.5 - 5.0 g/dL   AST 59 (H) 15 - 41 U/L   ALT 23 0 - 44 U/L   Alkaline Phosphatase 44 38 - 126 U/L   Total Bilirubin 1.9 (H) 0.3 - 1.2 mg/dL   GFR, Estimated >60 >60 mL/min    Comment: (NOTE) Calculated using the CKD-EPI Creatinine Equation (2021)    Anion gap 8 5 - 15    Comment: Performed at [facility], [place] [address], [place], [place] [address]  CBC with Differential/Platelet     Status: Abnormal   Collection Time: 06/04/22  2:50 AM  Result Value Ref Range   WBC 7.9 4.0 - 10.5 K/uL   RBC 2.99 (L) 4.22 - 5.81 MIL/uL   Hemoglobin 10.5 (L) 13.0 - 17.0 g/dL   HCT 29.5 (L) 39.0 - 52.0 %   MCV 98.7 80.0 - 100.0 fL   MCH 35.1 (H) 26.0 - 34.0 pg   MCHC 35.6 30.0 - 36.0 g/dL   RDW 12.2 11.5 - 15.5 %   Platelets 103 (L) 150 - 400 K/uL   nRBC 0.0 0.0 - 0.2 %   Neutrophils Relative % 75 %   Neutro Abs 5.9 1.7 - 7.7 K/uL   Lymphocytes Relative 13 %   Lymphs Abs 1.0 0.7 - 4.0 K/uL   Monocytes Relative 11 %   Monocytes Absolute 0.9 0.1 - 1.0 K/uL   Eosinophils Relative 0 %   Eosinophils Absolute 0.0 0.0 - 0.5 K/uL   Basophils Relative 0 %   Basophils Absolute 0.0 0.0 - 0.1 K/uL   Immature Granulocytes 1 %   Abs Immature Granulocytes 0.04 0.00 - 0.07 K/uL    Comment: Performed  at [facility], [address], [place], [place] [address]  Lactic acid, plasma     Status: None   Collection Time: 06/04/22  2:50 AM  Result Value Ref Range   Lactic Acid, Venous 1.3 0.5 - 1.9 mmol/L    Comment: Performed at [facility] [address], [place], [place] [address]  Lactic acid, plasma     Status: Abnormal   Collection Time: 06/04/22  5:20 AM  Result Value Ref Range   Lactic Acid, Venous 3.6 (HH) 0.5 - 1.9 mmol/L    Comment: CRITICAL RESULT CALLED TO, READ BACK BY AND VERIFIED WITH. Luana Shu RN, [phone], 06/04/22, [id number] Performed at [facility], [place] [address], [place],  [address]   SARS Coronavirus 2 by RT PCR (hospital order, performed in [facility] hospital lab) *cepheid single result test* Anterior Nasal Swab     Status: None   Collection Time: 06/04/22  5:20 AM   Specimen: Anterior Nasal Swab  Result Value Ref Range   SARS Coronavirus 2 by RT PCR NEGATIVE NEGATIVE    Comment: (NOTE) SARS-CoV-2 target nucleic acids are NOT DETECTED.  The SARS-CoV-2 RNA is  generally detectable in upper and lower respiratory specimens during the acute phase of infection. The lowest concentration of SARS-CoV-2 viral copies this assay can detect is 250 copies / mL. A negative result does not preclude SARS-CoV-2 infection and should not be used as the sole basis for treatment or other patient management decisions.  A negative result may occur with improper specimen collection / handling, submission of specimen other than nasopharyngeal swab, presence of viral mutation(s) within the areas targeted by this assay, and inadequate number of viral copies (<250 copies / mL). A negative result must be combined with clinical observations, patient history, and epidemiological information.  Fact Sheet for Patients:   https://www.patel.info/  Fact Sheet for Healthcare Providers: https://hall.com/  This test is not yet  approved or  cleared by the [place] FDA and has been authorized for detection and/or diagnosis of SARS-CoV-2 by FDA under an Emergency Use Authorization (EUA).  This EUA will remain in effect (meaning this test can be used) for the duration of the COVID-19 declaration under Section 564(b)(1) of the Act, 21 U.S.C. section 360bbb-3(b)(1), unless the authorization is terminated or revoked sooner.  Performed at [facility], [place] [address] [place], [place] [address]    DG Pelvis Portable  Result Date: 06/04/2022 CLINICAL DATA:  Fall. EXAM: PORTABLE PELVIS 1-2 VIEWS COMPARISON:  None Available. FINDINGS: There is no evidence of pelvic fracture or diastasis. No pelvic bone lesions are seen. Heavy vascular calcifications are noted in the pelvis and lower extremities bilaterally. Degenerative changes are noted in the lower lumbar spine. IMPRESSION: No acute fracture or dislocation. Electronically Signed   By: Brett Fairy M.D.   On: 06/04/2022 04:52   CT CERVICAL SPINE WO CONTRAST  Result Date: 06/03/2022 CLINICAL DATA:  Trauma. EXAM: CT CERVICAL SPINE WITHOUT CONTRAST TECHNIQUE: Multidetector CT imaging of the cervical spine was performed without intravenous contrast. Multiplanar CT image reconstructions were also generated. RADIATION DOSE REDUCTION: This exam was performed according to the departmental dose-optimization program which includes automated exposure control, adjustment of the mA and/or kV according to patient size and/or use of iterative reconstruction technique. COMPARISON:  None Available. FINDINGS: Alignment: There is trace anterolisthesis at C3-C4 which is favored is degenerative. Alignment is otherwise anatomic. Skull base and vertebrae: Bones are osteopenic. No acute fracture. No primary bone lesion or focal pathologic process. Soft tissues and spinal canal: No prevertebral fluid or swelling. No visible canal hematoma. Disc levels: There is disc space narrowing which is  severe at C5-C6, C6-C7 and C7-T1. Scattered bilateral facet arthropathy is present causing multilevel mild neural foraminal stenosis. There is moderate bilateral neural foraminal stenosis at C3-C4 secondary to facet arthropathy and uncovertebral spurring. There is mild central canal stenosis at C6-C7 secondary to posterior disc osteophyte complex. No severe central canal stenosis identified at any level. Upper chest: Negative. Other: None. IMPRESSION: 1. No acute fracture or traumatic subluxation of the cervical spine. 2.  Moderate degenerative changes of the cervical spine. Electronically Signed   By: Ronney Asters M.D.   On: 06/03/2022 22:55   CT HEAD WO CONTRAST  Result Date: 06/03/2022 CLINICAL DATA:  Mental status change, fall yesterday EXAM: CT HEAD WITHOUT CONTRAST TECHNIQUE: Contiguous axial images were obtained from the base of the skull through the vertex without intravenous contrast. RADIATION DOSE REDUCTION: This exam was performed according to the departmental dose-optimization program which includes automated exposure control, adjustment of the mA and/or kV according to patient size and/or use of iterative reconstruction technique. COMPARISON:  None Available. FINDINGS: Brain: No intracranial hemorrhage, mass effect, or evidence of acute infarct. No hydrocephalus. No extra-axial fluid collection. Generalized cerebral atrophy. Ill-defined hypoattenuation within the cerebral white matter is nonspecific but consistent with chronic small vessel ischemic disease. Vascular: No hyperdense vessel. Calcification of the intracranial internal carotid arteries. Skull: No fracture or focal lesion. Sinuses/Orbits: No acute finding. Mild mucosal thickening in the maxillary sinuses. Other: None. IMPRESSION: No acute intracranial abnormality. Generalized cerebral atrophy and chronic small-vessel ischemic disease. Electronically Signed   By: Placido Sou M.D.   On: 06/03/2022 22:47   DG Chest Portable 1  View  Result Date: 06/03/2022 CLINICAL DATA:  Altered mental status. EXAM: PORTABLE CHEST 1 VIEW COMPARISON:  December 25, 2021 FINDINGS: There is a dual lead AICD. Multiple sternal wires are seen. The heart size and mediastinal contours are within normal limits. An artificial cardiac valve is seen. Both lungs are clear. The visualized skeletal structures are unremarkable. IMPRESSION: 1. Evidence of prior median sternotomy. 2. No acute or active cardiopulmonary disease. Electronically Signed   By: Virgina Norfolk M.D.   On: 06/03/2022 21:33    Pending Labs Unresulted Labs (From admission, onward)     Start     Ordered   06/05/22 0500  Procalcitonin  Daily at 5am,   R      06/04/22 0744   06/04/22 0745  Procalcitonin - Baseline  Add-on,   AD        06/04/22 0744   06/04/22 0431  Hepatitis panel, acute  Once,   R        06/04/22 0430   06/03/22 2335  Culture, blood (routine x 2)  BLOOD CULTURE X 2,   R      06/03/22 2334            Vitals/Pain Today's Vitals   06/04/22 0700 06/04/22 0800 06/04/22 0928 06/04/22 1045  BP: 124/69 124/61  130/64  Pulse: 72 63  63  Resp: (!) '23 18  17  '$ Temp:   98.5 F (36.9 C)   TempSrc:   Oral   SpO2: 96% 97%  100%  Weight:      Height:      PainSc:        Isolation Precautions Airborne and Contact precautions  Medications Medications  ceFEPIme (MAXIPIME) 2 g in sodium chloride 0.9 % 100 mL IVPB (has no administration in time range)  vancomycin (VANCOREADY) IVPB 1500 mg/300 mL (has no administration in time range)  aspirin EC tablet 81 mg (has no administration in time range)  metoprolol tartrate (LOPRESSOR) tablet 12.5 mg (12.5 mg Oral Given 06/04/22 0308)  lisinopril (ZESTRIL) tablet 20 mg (has no administration in time range)  pantoprazole (PROTONIX) EC tablet 40 mg (has no administration in time range)  enoxaparin (LOVENOX) injection 40 mg (has no administration in time range)  acetaminophen (TYLENOL) tablet 650 mg (has no administration  in time range)    Or  acetaminophen (TYLENOL) suppository 650 mg (has no administration in time range)  0.9 %  sodium chloride infusion ( Intravenous New Bag/Given 06/04/22 0800)  lactated ringers bolus 1,000 mL (0 mLs Intravenous Stopped 06/04/22 0148)  acetaminophen (TYLENOL) suppository 650 mg (650 mg Rectal Given 06/04/22 0100)  ceFEPIme (MAXIPIME) 2 g in sodium chloride 0.9 % 100 mL IVPB (0 g Intravenous Stopped 06/04/22 0130)  vancomycin (VANCOREADY) IVPB 1500 mg/300 mL (0 mg Intravenous Stopped 06/04/22 0331)  sodium chloride 0.9 % bolus 2,000 mL (0 mLs Intravenous Stopped 06/04/22 1113)  Mobility walks with device High fall risk   Focused Assessments     R Recommendations: See Admitting Provider Note  Report given to:   Additional Notes:   Restraints currently off patient.

## 2022-06-04 NOTE — Sepsis Progress Note (Signed)
Following for sepsis monitoring ?

## 2022-06-04 NOTE — Assessment & Plan Note (Addendum)
Presumed secondary to infection and likely etiology of fall. Mentation improved with treatment of underlying infection.

## 2022-06-04 NOTE — Assessment & Plan Note (Addendum)
No chest pain. -Continue aspirin and metoprolol -Home Lipitor held secondary to rhabdomyolysis

## 2022-06-04 NOTE — ED Notes (Signed)
C-collar removed, pt has been cleared, okay and verbal order by Dr. Hal Hope

## 2022-06-04 NOTE — H&P (Addendum)
History and Physical    Joshua [name] Joshua [name] DOB: [birth year] DOA: 06/03/2022  PCP: Shelda Pal, DO  Patient coming from: Home.  Chief Complaint: Confusion.  History provided by patient's daughter-in-law.  HPI: Joshua [name] is a 86 y.o. male with history of CAD status post CABG, aortic valve replacement, complete heart block status post pacemaker placement, hyperlipidemia, hypertension was found to be confused and was lying on the floor when patient's daughter-in-law went to check on him.  As per patient's daughter-in-law patient's son spoke to him over the phone on Tuesday that is 48 hours ago.  Patient's son is in [place] and was checking on him yesterday when he did not pick his phone.  Patient's daughter-in-law went to check on him and he was lying on the floor inside his closet half naked.  Patient appeared confused.  ED Course: In the ER patient appears confused CT head and C-spine was unremarkable.  Patient had a fever 102 F tachycardic with UA showing features consistent with UTI.  Patient was started on empiric antibiotics after blood cultures obtained.  Patient admitted for sepsis and UTI.  Review of Systems: As per HPI, rest all negative.   Past Medical History:  Diagnosis Date   Arthritis    Cataract    Esophageal stricture    GERD (gastroesophageal reflux disease)    Hyperlipidemia    Hypertension    Liver disease    hepatitis   Severe aortic stenosis     Past Surgical History:  Procedure Laterality Date   AORTIC VALVE REPLACEMENT N/A 11/04/2021   Procedure: AORTIC VALVE REPLACEMENT (AVR) USING 23 MM INSPIRIS RESILIA  AORTIC VALVE;  Surgeon: Lajuana Matte, MD;  Location: [place];  Service: Open Heart Surgery;  Laterality: N/A;   cataract  2013   CORONARY ARTERY BYPASS GRAFT N/A 11/04/2021   Procedure: CORONARY ARTERY BYPASS GRAFTING (CABG) X 3, USING LEFT INTERNAL MAMMARY ARTERY AND ENDOSCOPICALLY HARVESTED RIGHT GREATER SAPHENOUS VEIN.  LIMA TO LAD, SVG TO PDA, SVG TO OM;  Surgeon: Lajuana Matte, MD;  Location: [place];  Service: Open Heart Surgery;  Laterality: N/A;   ENDOVEIN HARVEST OF GREATER SAPHENOUS VEIN Right 11/04/2021   Procedure: ENDOVEIN HARVEST OF GREATER SAPHENOUS VEIN;  Surgeon: Lajuana Matte, MD;  Location: [place];  Service: Open Heart Surgery;  Laterality: Right;   EXPLORATION POST OPERATIVE OPEN HEART N/A 11/05/2021   Procedure: EXPLORATION POST OPERATIVE OPEN HEARTFOR BLEEDING;  Surgeon: Melrose Nakayama, MD;  Location: [place];  Service: Open Heart Surgery;  Laterality: N/A;   HEMORROIDECTOMY  2001   HERNIA REPAIR  2005   INTRAVASCULAR PRESSURE WIRE/FFR STUDY N/A 11/03/2021   Procedure: INTRAVASCULAR PRESSURE WIRE/FFR STUDY;  Surgeon: Burnell Blanks, MD;  Location: [place] [facility];  Service: Cardiovascular;  Laterality: N/A;   PACEMAKER IMPLANT N/A 11/09/2021   Procedure: PACEMAKER IMPLANT;  Surgeon: Vickie Epley, MD;  Location: [place] [facility];  Service: Cardiovascular;  Laterality: N/A;   RIGHT/LEFT HEART CATH AND CORONARY ANGIOGRAPHY N/A 11/03/2021   Procedure: RIGHT/LEFT HEART CATH AND CORONARY ANGIOGRAPHY;  Surgeon: Burnell Blanks, MD;  Location: [place] [facility];  Service: Cardiovascular;  Laterality: N/A;   TEE WITHOUT CARDIOVERSION N/A 11/04/2021   Procedure: TRANSESOPHAGEAL ECHOCARDIOGRAM (TEE);  Surgeon: Lajuana Matte, MD;  Location: [place];  Service: Open Heart Surgery;  Laterality: N/A;   TEMPORARY PACEMAKER N/A 11/07/2021   Procedure: TEMPORARY PACEMAKER;  Surgeon: [name], MD;  Location:  [place] [facility];  Service: Cardiovascular;  Laterality: N/A;   TONSILLECTOMY  1948     reports that he quit smoking about 58 years ago. His smoking use included cigarettes. He has never used smokeless tobacco. He reports current alcohol use of about 21.0 standard drinks of alcohol per week. He reports that he does not use drugs.  No Known  Allergies  Family History  Problem Relation Age of Onset   Heart failure Mother 33   Heart failure Father 11       Scarlet fever as child-Valve disease   Cancer Maternal Grandmother    Diabetes Child    Heart Problems Child 73       cardiac arrest    Prior to Admission medications   Medication Sig Start Date End Date Taking? Authorizing Provider  aspirin EC 81 MG tablet Take 1 tablet (81 mg total) by mouth daily. Swallow whole. 03/12/22  Yes Croitoru, Mihai, MD  atorvastatin (LIPITOR) 40 MG tablet Take 1 tablet (40 mg total) by mouth daily. 01/20/22  Yes Shelda Pal, DO  Glucosamine HCl (GLUCOSAMINE PO) Take 3,000 mg by mouth every other day.   Yes [provider]  lisinopril (ZESTRIL) 20 MG tablet TAKE 1 TABLET EVERY DAY Patient taking differently: Take 20 mg by mouth daily. 03/02/22  Yes Wendling, Crosby Oyster, DO  melatonin 5 MG TABS Take 1 tablet (5 mg total) by mouth at bedtime as needed. 12/15/21  Yes Medina-Vargas, Monina C, NP  metoprolol tartrate (LOPRESSOR) 25 MG tablet TAKE 1/2 TABLET TWICE DAILY, HOLD FOR HEART RATE LESS THAN 60, SYSTOLIC BLOOD PRESSURE LESS THAN OR EQUAL TO 105 Patient taking differently: Take 12.5 mg by mouth 2 (two) times daily. 04/01/22  Yes Shelda Pal, DO  Multiple Vitamin (MULTIVITAMIN) tablet Take 1 tablet by mouth daily.   Yes [provider]  omeprazole (PRILOSEC) 40 MG capsule TAKE 1 CAPSULE EVERY DAY Patient taking differently: Take 40 mg by mouth daily. 01/28/22  Yes Shelda Pal, DO  vitamin B-12 (CYANOCOBALAMIN) 1000 MCG tablet Take 1,000 mcg by mouth daily.   Yes [provider]  amoxicillin (AMOXIL) 500 MG tablet Take 4 tablets (2000 mg) two hours prior to a dental procedure Patient not taking: Reported on 06/04/2022 03/12/22   Croitoru, Mihai, MD  COVID-19 mRNA bivalent vaccine, Pfizer, (PFIZER COVID-19 VAC BIVALENT) injection Inject into the muscle. 05/26/22   Carlyle Basques, MD     Physical Exam: Constitutional: Moderately built and nourished. Vitals:   06/04/22 0115 06/04/22 0130 06/04/22 0200 06/04/22 0215  BP:  (!) 116/42 (!) 144/81 132/70  Pulse: (!) 106 98 (!) 103 82  Resp: '17 16 17 19  '$ Temp:  (!) 101.6 F (38.7 C)  100.2 F (37.9 C)  TempSrc:  Oral  Oral  SpO2: 95% 96% 100% 100%  Weight:      Height:       Eyes: Anicteric no pallor. ENMT: No discharge from the ears eyes nose and mouth. Neck: Mass felt.  No neck rigidity. Respiratory: No rhonchi or crepitations. Cardiovascular: S1-S2 heard. Abdomen: Soft nontender bowel sound present. Musculoskeletal: No edema. Skin: No rash. Neurologic: Alert awake oriented to his name and place.  Moving all extremities. Psychiatric: Appears confused.   Labs on Admission: I have personally reviewed following labs and imaging studies  CBC: Recent Labs  Lab 06/03/22 2056 06/03/22 2117  WBC 9.0  --   NEUTROABS 7.8*  --   HGB 12.2* 12.2*  HCT 34.3*  36.0*  MCV 98.3  --   PLT 126*  --    Basic Metabolic Panel: Recent Labs  Lab 06/03/22 2056 06/03/22 2117  NA 140 139  K 4.5 4.5  CL 102 100  CO2 28  --   GLUCOSE 155* 152*  BUN 14 14  CREATININE 1.03 0.90  CALCIUM 9.3  --    GFR: Estimated Creatinine Clearance: 67.3 mL/min (by C-G formula based on SCr of 0.9 mg/dL). Liver Function Tests: Recent Labs  Lab 06/03/22 2056  AST 60*  ALT 25  ALKPHOS 54  BILITOT 1.7*  PROT 7.1  ALBUMIN 4.0   No results for input(s): "LIPASE", "AMYLASE" in the last 168 hours. Recent Labs  Lab 06/03/22 2056  AMMONIA 15   Coagulation Profile: Recent Labs  Lab 06/03/22 2056  INR 1.1   Cardiac Enzymes: Recent Labs  Lab 06/03/22 2324  CKTOTAL 1,439*   BNP (last 3 results) No results for input(s): "PROBNP" in the last 8760 hours. HbA1C: No results for input(s): "HGBA1C" in the last 72 hours. CBG: Recent Labs  Lab 06/03/22 2145  GLUCAP 139*   Lipid Profile: No results for input(s): "CHOL",  "HDL", "LDLCALC", "TRIG", "CHOLHDL", "LDLDIRECT" in the last 72 hours. Thyroid Function Tests: No results for input(s): "TSH", "T4TOTAL", "FREET4", "T3FREE", "THYROIDAB" in the last 72 hours. Anemia Panel: No results for input(s): "VITAMINB12", "FOLATE", "FERRITIN", "TIBC", "IRON", "RETICCTPCT" in the last 72 hours. Urine analysis:    Component Value Date/Time   COLORURINE YELLOW 06/03/2022 2252   APPEARANCEUR HAZY (A) 06/03/2022 2252   LABSPEC 1.018 06/03/2022 2252   PHURINE 5.0 06/03/2022 2252   GLUCOSEU NEGATIVE 06/03/2022 2252   HGBUR SMALL (A) 06/03/2022 2252   BILIRUBINUR NEGATIVE 06/03/2022 2252   KETONESUR 20 (A) 06/03/2022 2252   PROTEINUR 30 (A) 06/03/2022 2252   NITRITE NEGATIVE 06/03/2022 2252   LEUKOCYTESUR SMALL (A) 06/03/2022 2252   Sepsis Labs: '@LABRCNTIP'$ (procalcitonin:4,lacticidven:4) )No results found for this or any previous visit (from the past 240 hour(s)).   Radiological Exams on Admission: CT CERVICAL SPINE WO CONTRAST  Result Date: 06/03/2022 CLINICAL DATA:  Trauma. EXAM: CT CERVICAL SPINE WITHOUT CONTRAST TECHNIQUE: Multidetector CT imaging of the cervical spine was performed without intravenous contrast. Multiplanar CT image reconstructions were also generated. RADIATION DOSE REDUCTION: This exam was performed according to the departmental dose-optimization program which includes automated exposure control, adjustment of the mA and/or kV according to patient size and/or use of iterative reconstruction technique. COMPARISON:  None Available. FINDINGS: Alignment: There is trace anterolisthesis at C3-C4 which is favored is degenerative. Alignment is otherwise anatomic. Skull base and vertebrae: Bones are osteopenic. No acute fracture. No primary bone lesion or focal pathologic process. Soft tissues and spinal canal: No prevertebral fluid or swelling. No visible canal hematoma. Disc levels: There is disc space narrowing which is severe at C5-C6, C6-C7 and C7-T1.  Scattered bilateral facet arthropathy is present causing multilevel mild neural foraminal stenosis. There is moderate bilateral neural foraminal stenosis at C3-C4 secondary to facet arthropathy and uncovertebral spurring. There is mild central canal stenosis at C6-C7 secondary to posterior disc osteophyte complex. No severe central canal stenosis identified at any level. Upper chest: Negative. Other: None. IMPRESSION: 1. No acute fracture or traumatic subluxation of the cervical spine. 2.  Moderate degenerative changes of the cervical spine. Electronically Signed   By: Ronney Asters M.D.   On: 06/03/2022 22:55   CT HEAD WO CONTRAST  Result Date: 06/03/2022 CLINICAL DATA:  Mental status change, fall yesterday  EXAM: CT HEAD WITHOUT CONTRAST TECHNIQUE: Contiguous axial images were obtained from the base of the skull through the vertex without intravenous contrast. RADIATION DOSE REDUCTION: This exam was performed according to the departmental dose-optimization program which includes automated exposure control, adjustment of the mA and/or kV according to patient size and/or use of iterative reconstruction technique. COMPARISON:  None Available. FINDINGS: Brain: No intracranial hemorrhage, mass effect, or evidence of acute infarct. No hydrocephalus. No extra-axial fluid collection. Generalized cerebral atrophy. Ill-defined hypoattenuation within the cerebral white matter is nonspecific but consistent with chronic small vessel ischemic disease. Vascular: No hyperdense vessel. Calcification of the intracranial internal carotid arteries. Skull: No fracture or focal lesion. Sinuses/Orbits: No acute finding. Mild mucosal thickening in the maxillary sinuses. Other: None. IMPRESSION: No acute intracranial abnormality. Generalized cerebral atrophy and chronic small-vessel ischemic disease. Electronically Signed   By: Placido Sou M.D.   On: 06/03/2022 22:47   DG Chest Portable 1 View  Result Date: 06/03/2022 CLINICAL  DATA:  Altered mental status. EXAM: PORTABLE CHEST 1 VIEW COMPARISON:  December 25, 2021 FINDINGS: There is a dual lead AICD. Multiple sternal wires are seen. The heart size and mediastinal contours are within normal limits. An artificial cardiac valve is seen. Both lungs are clear. The visualized skeletal structures are unremarkable. IMPRESSION: 1. Evidence of prior median sternotomy. 2. No acute or active cardiopulmonary disease. Electronically Signed   By: Virgina Norfolk M.D.   On: 06/03/2022 21:33    EKG: Independently reviewed.  Paced rhythm.  Assessment/Plan Principal Problem:   Sepsis ([place]) Active Problems:   Essential hypertension   S/P CABG x 3   S/P aortic valve replacement with bioprosthetic valve   CHB (complete heart block) (H[facility])   Acute encephalopathy    Sepsis secondary UTI for which patient is placed on empiric antibiotics follow cultures.  Continue with hydration.  COVID test is pending. Acute encephalopathy Per patient's daughter-in-law who is at the bedside states that patient is still not back to baseline.  Acute encephalopathy likely from sepsis.  In addition we will also check ammonia levels. Mild rhabdomyolysis likely from fall.  Hydrate and recheck.  Hold statins for now. CAD status post CABG denies any chest pain.  Continues aspirin and beta-blockers.  Holding statins due to rhabdomyolysis. Aortic valve replacement.  Follow cultures. Complete heart block status post pacemaker placement. Mildly elevated AST and total bilirubin.  Follow LFTs.  Check acute hepatitis panel and ammonia levels.  Abdomen appears benign. Anemia appears to be chronic follow CBC.  Since patient presented with sepsis will need close monitoring and inpatient status.   COVID test is pending.  DVT prophylaxis: Lovenox. Code Status: Full code. Family Communication: Daughter-in-law. Disposition Plan: Home when stable. Consults called: None. Admission status: Inpatient.   Rise Patience MD [facility] Pager [phone].  If 7PM-7AM, please contact night-coverage [email or web address] Password [facility]  06/04/2022, 2:24 AM

## 2022-06-05 DIAGNOSIS — R6520 Severe sepsis without septic shock: Secondary | ICD-10-CM | POA: Diagnosis not present

## 2022-06-05 DIAGNOSIS — A419 Sepsis, unspecified organism: Secondary | ICD-10-CM | POA: Diagnosis not present

## 2022-06-05 DIAGNOSIS — M6282 Rhabdomyolysis: Secondary | ICD-10-CM | POA: Diagnosis not present

## 2022-06-05 DIAGNOSIS — G9341 Metabolic encephalopathy: Secondary | ICD-10-CM | POA: Diagnosis not present

## 2022-06-05 LAB — CBC WITH DIFFERENTIAL/PLATELET
Abs Immature Granulocytes: 0.05 10*3/uL (ref 0.00–0.07)
Basophils Absolute: 0 10*3/uL (ref 0.0–0.1)
Basophils Relative: 0 %
Eosinophils Absolute: 0.1 10*3/uL (ref 0.0–0.5)
Eosinophils Relative: 1 %
HCT: 29.4 % — ABNORMAL LOW (ref 39.0–52.0)
Hemoglobin: 10.3 g/dL — ABNORMAL LOW (ref 13.0–17.0)
Immature Granulocytes: 1 %
Lymphocytes Relative: 12 %
Lymphs Abs: 1 10*3/uL (ref 0.7–4.0)
MCH: 35.2 pg — ABNORMAL HIGH (ref 26.0–34.0)
MCHC: 35 g/dL (ref 30.0–36.0)
MCV: 100.3 fL — ABNORMAL HIGH (ref 80.0–100.0)
Monocytes Absolute: 0.8 10*3/uL (ref 0.1–1.0)
Monocytes Relative: 9 %
Neutro Abs: 6.6 10*3/uL (ref 1.7–7.7)
Neutrophils Relative %: 77 %
Platelets: 101 10*3/uL — ABNORMAL LOW (ref 150–400)
RBC: 2.93 MIL/uL — ABNORMAL LOW (ref 4.22–5.81)
RDW: 12 % (ref 11.5–15.5)
WBC: 8.6 10*3/uL (ref 4.0–10.5)
nRBC: 0 % (ref 0.0–0.2)

## 2022-06-05 LAB — COMPREHENSIVE METABOLIC PANEL
ALT: 30 U/L (ref 0–44)
AST: 86 U/L — ABNORMAL HIGH (ref 15–41)
Albumin: 3 g/dL — ABNORMAL LOW (ref 3.5–5.0)
Alkaline Phosphatase: 44 U/L (ref 38–126)
Anion gap: 9 (ref 5–15)
BUN: 14 mg/dL (ref 8–23)
CO2: 22 mmol/L (ref 22–32)
Calcium: 8.1 mg/dL — ABNORMAL LOW (ref 8.9–10.3)
Chloride: 104 mmol/L (ref 98–111)
Creatinine, Ser: 1.1 mg/dL (ref 0.61–1.24)
GFR, Estimated: >60 mL/min (ref >60)
Glucose, Bld: 91 mg/dL (ref 70–99)
Potassium: 3.4 mmol/L — ABNORMAL LOW (ref 3.5–5.1)
Sodium: 135 mmol/L (ref 135–145)
Total Bilirubin: 2.9 mg/dL — ABNORMAL HIGH (ref 0.3–1.2)
Total Protein: 5.4 g/dL — ABNORMAL LOW (ref 6.5–8.1)

## 2022-06-05 LAB — CK: Total CK: 2533 U/L — ABNORMAL HIGH (ref 49–397)

## 2022-06-05 LAB — PROCALCITONIN: Procalcitonin: <0.1 ng/mL

## 2022-06-05 LAB — MAGNESIUM: Magnesium: 1.4 mg/dL — ABNORMAL LOW (ref 1.7–2.4)

## 2022-06-05 MED ORDER — SODIUM CHLORIDE 0.9 % IV SOLN
1.0000 g | INTRAVENOUS | Status: AC
  Administered 2022-06-05 – 2022-06-07 (×3): 1 g via INTRAVENOUS
  Filled 2022-06-05 (×3): qty 10

## 2022-06-05 MED ORDER — POTASSIUM CHLORIDE 20 MEQ PO PACK
40.0000 meq | PACK | Freq: Once | ORAL | Status: AC
  Administered 2022-06-05: 40 meq via ORAL
  Filled 2022-06-05: qty 2

## 2022-06-05 MED ORDER — SODIUM CHLORIDE 0.9 % IV SOLN: INTRAVENOUS | Status: DC

## 2022-06-05 MED ORDER — MAGNESIUM SULFATE 4 GM/100ML IV SOLN
4.0000 g | Freq: Once | INTRAVENOUS | Status: AC
  Administered 2022-06-05: 4 g via INTRAVENOUS
  Filled 2022-06-05: qty 100

## 2022-06-05 NOTE — Progress Notes (Signed)
Progress Note    Joshua [name]   ESP:[id number]  DOB: 02/06/1936  DOA: 06/03/2022     1 PCP: Joshua Pal, DO  Initial CC: AMS, found down  Hospital Course: Joshua [name] is an 86 yo male with PMH HTN, HLD, GERD, arthritis, severe AS s/p AVR, CAD s/p CABG, CHB s/p pacer who was found on the floor at home in his closet partially undressed.  He was last known to be normal on 06/02/2022 in the afternoon and was found on 06/03/2022 in the evening.  He had a fever on arrival to the ER and underwent further work-up. Multiple imaging studies were performed including CXR, CT head, CT C-spine, and pelvis x-ray.  There were no acute findings.  CT head did show underlying generalized cerebral atrophy and chronic small vessel disease. UA was notable for small LE, greater than 50 WBC, few bacteria, hyaline casts. Lactic acid was initially elevated 2.2.  He was started on antibiotics and admitted for further evaluation.  Interval History:  Seen this morning in the ER.  His son and daughter-in-law were present bedside.  Patient was more awake and oriented compared to initial presentation.  He was able to state his name, president, hospital and city.  When asked the year currently he told me his birth year.  He otherwise had some underlying appreciable mild signs of dementia and no other acute concerns.  Assessment and Plan: * Severe sepsis ([place]) - Febrile, tachycardia, tachypnea, lactic acidosis.  Presumed urinary source - UA does show some signs concerning for infection - Procalcitonin initially is negative, continue trending - Continue fluids and trending lactic acid (now cleared) - d/c vanc today; change cefepime to rocephin  Rhabdomyolysis - CK 1,439 on admission; etiology presumed from being down on floor for about 1.5 days -CK further increased today.  Continue trending - Continue fluids  Acute metabolic encephalopathy - patient symptoms include AMS, lethargy - etiology  considered due to metabolic derangements and underlying sepsis  - NH3 normal  - continue treatment  -Mentation continues to improve.  Son present bedside this morning as well - Transitioning antibiotics today also  Normocytic anemia - baseline Hgb 10-11 g/dL at baseline - currently at baseline  CAD (coronary artery disease) - Continue aspirin and beta-blocker - Statin on hold  CHB (complete heart block) ([place]) - Pacemaker in place  Essential hypertension - Slightly elevated blood pressures on admission.  Now better controlled - Continue Lopressor and lisinopril   Old records reviewed in assessment of this patient  Antimicrobials: Vanc 06/04/22 >> current  Cefepime 06/04/22 >> current  DVT prophylaxis:  enoxaparin (LOVENOX) injection 40 mg Start: 06/04/22 1000   Code Status:   Code Status: Full Code  Mobility Assessment (last 72 hours)     Mobility Assessment     Row Name 06/05/22 0815 06/04/22 2114 06/04/22 1300       Does patient have an order for bedrest or is patient medically unstable No - Continue assessment No - Continue assessment No - Continue assessment     What is the highest level of mobility based on the progressive mobility assessment? Level 3 (Stands with assist) - Balance while standing  and cannot march in place Level 3 (Stands with assist) - Balance while standing  and cannot march in place Level 3 (Stands with assist) - Balance while standing  and cannot march in place     Is the above level different from baseline mobility prior to current illness? Yes -  Recommend PT order Yes - Recommend PT order Yes - Recommend PT order              Barriers to discharge: none Disposition Plan:  Pending PT eval Status is: Inpt  Objective: Blood pressure (!) 141/69, pulse 64, temperature 98.1 F (36.7 C), temperature source Oral, resp. rate 20, height 5' 11.5" (1.816 m), weight 81.8 kg, SpO2 98 %.  Examination:  Physical Exam Constitutional:      General:  He is not in acute distress.    Comments: Much less fatigued appearing  HENT:     Head: Normocephalic and atraumatic.     Mouth/Throat:     Mouth: Mucous membranes are moist.  Eyes:     Extraocular Movements: Extraocular movements intact.  Cardiovascular:     Rate and Rhythm: Normal rate and regular rhythm.  Pulmonary:     Effort: Pulmonary effort is normal. No respiratory distress.     Breath sounds: Normal breath sounds. No wheezing.  Abdominal:     General: Bowel sounds are normal. There is no distension.     Palpations: Abdomen is soft.     Tenderness: There is no abdominal tenderness.  Musculoskeletal:        General: Normal range of motion.     Cervical back: Normal range of motion and neck supple.  Skin:    General: Skin is warm and dry.  Neurological:     Mental Status: He is alert.     Comments: Oriented to name, president, hospital/city. Follows commands easily and moves all 4 extremities. No focal weakness. Still does not know the year   Psychiatric:        Mood and Affect: Mood normal.      Consultants:    Procedures:    Data Reviewed: Results for orders placed or performed during the hospital encounter of 06/03/22 (from the past 24 hour(s))  Lactic acid, plasma     Status: None   Collection Time: 06/04/22  6:03 PM  Result Value Ref Range   Lactic Acid, Venous 1.4 0.5 - 1.9 mmol/L  Lactic acid, plasma     Status: None   Collection Time: 06/04/22  8:18 PM  Result Value Ref Range   Lactic Acid, Venous 1.7 0.5 - 1.9 mmol/L  Procalcitonin     Status: None   Collection Time: 06/05/22  1:24 AM  Result Value Ref Range   Procalcitonin <0.10 ng/mL  CK     Status: Abnormal   Collection Time: 06/05/22  1:24 AM  Result Value Ref Range   Total CK 2,533 (H) 49 - 397 U/L  CBC with Differential/Platelet     Status: Abnormal   Collection Time: 06/05/22  1:24 AM  Result Value Ref Range   WBC 8.6 4.0 - 10.5 K/uL   RBC 2.93 (L) 4.22 - 5.81 MIL/uL   Hemoglobin 10.3  (L) 13.0 - 17.0 g/dL   HCT 29.4 (L) 39.0 - 52.0 %   MCV 100.3 (H) 80.0 - 100.0 fL   MCH 35.2 (H) 26.0 - 34.0 pg   MCHC 35.0 30.0 - 36.0 g/dL   RDW 12.0 11.5 - 15.5 %   Platelets 101 (L) 150 - 400 K/uL   nRBC 0.0 0.0 - 0.2 %   Neutrophils Relative % 77 %   Neutro Abs 6.6 1.7 - 7.7 K/uL   Lymphocytes Relative 12 %   Lymphs Abs 1.0 0.7 - 4.0 K/uL   Monocytes Relative 9 %   Monocytes Absolute  0.8 0.1 - 1.0 K/uL   Eosinophils Relative 1 %   Eosinophils Absolute 0.1 0.0 - 0.5 K/uL   Basophils Relative 0 %   Basophils Absolute 0.0 0.0 - 0.1 K/uL   Immature Granulocytes 1 %   Abs Immature Granulocytes 0.05 0.00 - 0.07 K/uL  Comprehensive metabolic panel     Status: Abnormal   Collection Time: 06/05/22  1:24 AM  Result Value Ref Range   Sodium 135 135 - 145 mmol/L   Potassium 3.4 (L) 3.5 - 5.1 mmol/L   Chloride 104 98 - 111 mmol/L   CO2 22 22 - 32 mmol/L   Glucose, Bld 91 70 - 99 mg/dL   BUN 14 8 - 23 mg/dL   Creatinine, Ser 1.10 0.61 - 1.24 mg/dL   Calcium 8.1 (L) 8.9 - 10.3 mg/dL   Total Protein 5.4 (L) 6.5 - 8.1 g/dL   Albumin 3.0 (L) 3.5 - 5.0 g/dL   AST 86 (H) 15 - 41 U/L   ALT 30 0 - 44 U/L   Alkaline Phosphatase 44 38 - 126 U/L   Total Bilirubin 2.9 (H) 0.3 - 1.2 mg/dL   GFR, Estimated >60 >60 mL/min   Anion gap 9 5 - 15  Magnesium     Status: Abnormal   Collection Time: 06/05/22  1:24 AM  Result Value Ref Range   Magnesium 1.4 (L) 1.7 - 2.4 mg/dL    I have Reviewed nursing notes, Vitals, and Lab results since pt's last encounter. Pertinent lab results : see above I have ordered test including BMP, CBC, Mg I have reviewed the last note from staff over past 24 hours I have discussed pt's care plan and test results with nursing staff, case manager   LOS: 1 day   Dwyane Dee, MD [facility] 06/05/2022, 4:56 PM

## 2022-06-05 NOTE — Progress Notes (Signed)
Mobility Specialist Progress Note:   06/05/22 1150  Mobility  Activity Ambulated with assistance in room  Level of Assistance Minimal assist, patient does 75% or more  Assistive Device Front wheel walker  Distance Ambulated (ft) 15 ft  Activity Response Tolerated well  $Mobility charge 1 Mobility   Pt in bed and agreeable. No complaints. Pt in chair with chair alarm on, call bell in reach, and all needs met.   Shirlie Enck Mobility Specialist-Acute Rehab Secure Chat only

## 2022-06-05 NOTE — Progress Notes (Signed)
Patient is confused,getting out of bed,removed his hand mittens twice,removed IV,  after restraints was ordered,on -call provider notified.

## 2022-06-06 DIAGNOSIS — A419 Sepsis, unspecified organism: Secondary | ICD-10-CM | POA: Diagnosis not present

## 2022-06-06 DIAGNOSIS — I10 Essential (primary) hypertension: Secondary | ICD-10-CM | POA: Diagnosis not present

## 2022-06-06 DIAGNOSIS — G9341 Metabolic encephalopathy: Secondary | ICD-10-CM | POA: Diagnosis not present

## 2022-06-06 DIAGNOSIS — R410 Disorientation, unspecified: Secondary | ICD-10-CM

## 2022-06-06 LAB — CBC WITH DIFFERENTIAL/PLATELET
Abs Immature Granulocytes: 0.05 10*3/uL (ref 0.00–0.07)
Basophils Absolute: 0.1 10*3/uL (ref 0.0–0.1)
Basophils Relative: 1 %
Eosinophils Absolute: 0.2 10*3/uL (ref 0.0–0.5)
Eosinophils Relative: 3 %
HCT: 30.1 % — ABNORMAL LOW (ref 39.0–52.0)
Hemoglobin: 10.8 g/dL — ABNORMAL LOW (ref 13.0–17.0)
Immature Granulocytes: 1 %
Lymphocytes Relative: 15 %
Lymphs Abs: 1.1 10*3/uL (ref 0.7–4.0)
MCH: 35.1 pg — ABNORMAL HIGH (ref 26.0–34.0)
MCHC: 35.9 g/dL (ref 30.0–36.0)
MCV: 97.7 fL (ref 80.0–100.0)
Monocytes Absolute: 0.9 10*3/uL (ref 0.1–1.0)
Monocytes Relative: 11 %
Neutro Abs: 5.4 10*3/uL (ref 1.7–7.7)
Neutrophils Relative %: 69 %
Platelets: 102 10*3/uL — ABNORMAL LOW (ref 150–400)
RBC: 3.08 MIL/uL — ABNORMAL LOW (ref 4.22–5.81)
RDW: 11.9 % (ref 11.5–15.5)
WBC: 7.7 10*3/uL (ref 4.0–10.5)
nRBC: 0 % (ref 0.0–0.2)

## 2022-06-06 LAB — COMPREHENSIVE METABOLIC PANEL
ALT: 29 U/L (ref 0–44)
AST: 76 U/L — ABNORMAL HIGH (ref 15–41)
Albumin: 3.1 g/dL — ABNORMAL LOW (ref 3.5–5.0)
Alkaline Phosphatase: 45 U/L (ref 38–126)
Anion gap: 7 (ref 5–15)
BUN: 13 mg/dL (ref 8–23)
CO2: 23 mmol/L (ref 22–32)
Calcium: 8.5 mg/dL — ABNORMAL LOW (ref 8.9–10.3)
Chloride: 107 mmol/L (ref 98–111)
Creatinine, Ser: 1.02 mg/dL (ref 0.61–1.24)
GFR, Estimated: >60 mL/min (ref >60)
Glucose, Bld: 114 mg/dL — ABNORMAL HIGH (ref 70–99)
Potassium: 3.7 mmol/L (ref 3.5–5.1)
Sodium: 137 mmol/L (ref 135–145)
Total Bilirubin: 1.8 mg/dL — ABNORMAL HIGH (ref 0.3–1.2)
Total Protein: 5.8 g/dL — ABNORMAL LOW (ref 6.5–8.1)

## 2022-06-06 LAB — PROCALCITONIN: Procalcitonin: <0.1 ng/mL

## 2022-06-06 LAB — CK: Total CK: 1865 U/L — ABNORMAL HIGH (ref 49–397)

## 2022-06-06 LAB — MAGNESIUM: Magnesium: 1.9 mg/dL (ref 1.7–2.4)

## 2022-06-06 NOTE — Progress Notes (Signed)
Progress Note    Joshua [name]   NGE:[id number]  DOB: 01-18-1936  DOA: 06/03/2022     2 PCP: Shelda Pal, DO  Initial CC: AMS, found down  Hospital Course: Joshua [name] is an 86 yo male with PMH HTN, HLD, GERD, arthritis, severe AS s/p AVR, CAD s/p CABG, CHB s/p pacer who was found on the floor at home in his closet partially undressed.  He was last known to be normal on 06/02/2022 in the afternoon and was found on 06/03/2022 in the evening.  He had a fever on arrival to the ER and underwent further work-up. Multiple imaging studies were performed including CXR, CT head, CT C-spine, and pelvis x-ray.  There were no acute findings.  CT head did show underlying generalized cerebral atrophy and chronic small vessel disease. UA was notable for small LE, greater than 50 WBC, few bacteria, hyaline casts. Lactic acid was initially elevated 2.2.  He was started on antibiotics and admitted for further evaluation.  Interval History:  No events overnight.  Mentation seems a little better today although he does still have some delirium.  He is seeing a piece of paper with writing on it when looking around the room.  Son is present bedside this morning as well.  He was able to state the year today and also remains oriented to all the other questions.  Assessment and Plan: * Severe sepsis ([place]) - Febrile, tachycardia, tachypnea, lactic acidosis.  Presumed urinary source - UA does show some signs concerning for infection - Procalcitonin remained negative  - Continue fluids and trending lactic acid (now cleared) - d/c'd vanc; changed cefepime to rocephin  Rhabdomyolysis - CK 1,439 on admission; etiology presumed from being down on floor for about 1.5 days - CK trending (1439 >> 2533 >> 1865) - Continue fluids  Acute metabolic encephalopathy - patient symptoms include AMS, lethargy - etiology considered due to metabolic derangements and underlying sepsis  - NH3 normal  - continue  treatment  -Mentation continues to improve.  Son present bedside this morning as well  Delirium - Suspect his encephalopathy from UTI is close to resolved and now some lingering hospital-acquired delirium which is not unexpected.  Discussed this with his son as well - Patient remains calm, cooperative.  He is alert and oriented x3 -Continue delirium precautions.  He does have some visual disturbances  Normocytic anemia - baseline Hgb 10-11 g/dL at baseline - currently at baseline  CAD (coronary artery disease) - Continue aspirin and beta-blocker - Statin on hold  CHB (complete heart block) ([place]) - Pacemaker in place  Essential hypertension - Slightly elevated blood pressures on admission.  Now better controlled - Continue Lopressor and lisinopril   Old records reviewed in assessment of this patient  Antimicrobials: Vanc 06/04/22 >> 06/05/2022 Cefepime 06/04/22 >> 06/05/2022 Rocephin 06/05/2022 >> current  DVT prophylaxis:  enoxaparin (LOVENOX) injection 40 mg Start: 06/04/22 1000   Code Status:   Code Status: Full Code  Mobility Assessment (last 72 hours)     Mobility Assessment     Row Name 06/06/22 1534 06/06/22 1300 06/06/22 1100 06/05/22 0815 06/04/22 2114   Does patient have an order for bedrest or is patient medically unstable -- -- No - Continue assessment No - Continue assessment No - Continue assessment   What is the highest level of mobility based on the progressive mobility assessment? Level 5 (Walks with assist in room/hall) - Balance while stepping forward/back and can walk in room with  assist - Complete Level 5 (Walks with assist in room/hall) - Balance while stepping forward/back and can walk in room with assist - Complete Level 5 (Walks with assist in room/hall) - Balance while stepping forward/back and can walk in room with assist - Complete Level 3 (Stands with assist) - Balance while standing  and cannot march in place Level 3 (Stands with assist) - Balance  while standing  and cannot march in place   Is the above level different from baseline mobility prior to current illness? -- -- Yes - Recommend PT order Yes - Recommend PT order Yes - Recommend PT order    [place] Name 06/04/22 1300           Does patient have an order for bedrest or is patient medically unstable No - Continue assessment       What is the highest level of mobility based on the progressive mobility assessment? Level 3 (Stands with assist) - Balance while standing  and cannot march in place       Is the above level different from baseline mobility prior to current illness? Yes - Recommend PT order                Barriers to discharge: none Disposition Plan:  Pending PT eval Status is: Inpt  Objective: Blood pressure 127/73, pulse 72, temperature 97.8 F (36.6 C), temperature source Oral, resp. rate 16, height 5' 11.5" (1.816 m), weight 81.8 kg, SpO2 92 %.  Examination:  Physical Exam Constitutional:      General: He is not in acute distress.    Comments: Much less fatigued appearing  HENT:     Head: Normocephalic and atraumatic.     Mouth/Throat:     Mouth: Mucous membranes are moist.  Eyes:     Extraocular Movements: Extraocular movements intact.  Cardiovascular:     Rate and Rhythm: Normal rate and regular rhythm.  Pulmonary:     Effort: Pulmonary effort is normal. No respiratory distress.     Breath sounds: Normal breath sounds. No wheezing.  Abdominal:     General: Bowel sounds are normal. There is no distension.     Palpations: Abdomen is soft.     Tenderness: There is no abdominal tenderness.  Musculoskeletal:        General: Normal range of motion.     Cervical back: Normal range of motion and neck supple.  Skin:    General: Skin is warm and dry.  Neurological:     Mental Status: He is alert.     Comments: Oriented to name, president, hospital/city. He did get the year today too.  Follows commands easily and moves all 4 extremities. No focal  weakness.   Psychiatric:        Mood and Affect: Mood normal.      Consultants:    Procedures:    Data Reviewed: Results for orders placed or performed during the hospital encounter of 06/03/22 (from the past 24 hour(s))  Procalcitonin     Status: None   Collection Time: 06/06/22 12:58 AM  Result Value Ref Range   Procalcitonin <0.10 ng/mL  CBC with Differential/Platelet     Status: Abnormal   Collection Time: 06/06/22 12:58 AM  Result Value Ref Range   WBC 7.7 4.0 - 10.5 K/uL   RBC 3.08 (L) 4.22 - 5.81 MIL/uL   Hemoglobin 10.8 (L) 13.0 - 17.0 g/dL   HCT 30.1 (L) 39.0 - 52.0 %   MCV 97.7 80.0 -  100.0 fL   MCH 35.1 (H) 26.0 - 34.0 pg   MCHC 35.9 30.0 - 36.0 g/dL   RDW 11.9 11.5 - 15.5 %   Platelets 102 (L) 150 - 400 K/uL   nRBC 0.0 0.0 - 0.2 %   Neutrophils Relative % 69 %   Neutro Abs 5.4 1.7 - 7.7 K/uL   Lymphocytes Relative 15 %   Lymphs Abs 1.1 0.7 - 4.0 K/uL   Monocytes Relative 11 %   Monocytes Absolute 0.9 0.1 - 1.0 K/uL   Eosinophils Relative 3 %   Eosinophils Absolute 0.2 0.0 - 0.5 K/uL   Basophils Relative 1 %   Basophils Absolute 0.1 0.0 - 0.1 K/uL   Immature Granulocytes 1 %   Abs Immature Granulocytes 0.05 0.00 - 0.07 K/uL  Comprehensive metabolic panel     Status: Abnormal   Collection Time: 06/06/22 12:58 AM  Result Value Ref Range   Sodium 137 135 - 145 mmol/L   Potassium 3.7 3.5 - 5.1 mmol/L   Chloride 107 98 - 111 mmol/L   CO2 23 22 - 32 mmol/L   Glucose, Bld 114 (H) 70 - 99 mg/dL   BUN 13 8 - 23 mg/dL   Creatinine, Ser 1.02 0.61 - 1.24 mg/dL   Calcium 8.5 (L) 8.9 - 10.3 mg/dL   Total Protein 5.8 (L) 6.5 - 8.1 g/dL   Albumin 3.1 (L) 3.5 - 5.0 g/dL   AST 76 (H) 15 - 41 U/L   ALT 29 0 - 44 U/L   Alkaline Phosphatase 45 38 - 126 U/L   Total Bilirubin 1.8 (H) 0.3 - 1.2 mg/dL   GFR, Estimated >60 >60 mL/min   Anion gap 7 5 - 15  Magnesium     Status: None   Collection Time: 06/06/22 12:58 AM  Result Value Ref Range   Magnesium 1.9 1.7 - 2.4  mg/dL  CK     Status: Abnormal   Collection Time: 06/06/22 12:58 AM  Result Value Ref Range   Total CK 1,865 (H) 49 - 397 U/L    I have Reviewed nursing notes, Vitals, and Lab results since pt's last encounter. Pertinent lab results : see above I have ordered test including BMP, CBC, Mg I have reviewed the last note from staff over past 24 hours I have discussed pt's care plan and test results with nursing staff, case manager   LOS: 2 days   Dwyane Dee, MD [facility] 06/06/2022, 4:10 PM

## 2022-06-06 NOTE — Assessment & Plan Note (Addendum)
Likely from UTI in addition to hospital acquired delirium. Significantly improved and appears to have resolved.

## 2022-06-06 NOTE — Evaluation (Signed)
Physical Therapy Evaluation Patient Details Name: Joshua [name] MRN: [id number] DOB: [birth year] Today's Date: 06/06/2022  History of Present Illness  86 y.o. M admitted on 06/03/22 due to AMS and fall. Pt undergoing work up for severe Sepsis. PMH inlcudes CAD, severe AS, HTN, HLD, arthritis, GERD, cataracts.  Clinical Impression  PTA, pt lives alone and is independent with mobility using a RW; reports several falls over past 6 months. Pt presents with decreased cognition, impaired standing balance, gait abnormalities, and functional weakness. Pt A&O to self place, and situation, but not time, stating year is 2035. Pt requiring min assist for functional mobility. Presents as a high fall risk based on decreased safety awareness, gait speed, and history of falls. In light of deficits and lack of 24/7 caregiver support, recommend ST SNF.      Recommendations for follow up therapy are one component of a multi-disciplinary discharge planning process, led by the attending physician.  Recommendations may be updated based on patient status, additional functional criteria and insurance authorization.  Follow Up Recommendations Skilled nursing-short term rehab (<3 hours/day) Can patient physically be transported by private vehicle: Yes    Assistance Recommended at Discharge Frequent or constant Supervision/Assistance  Patient can return home with the following  A little help with walking and/or transfers;A little help with bathing/dressing/bathroom;Assistance with cooking/housework;Direct supervision/assist for medications management;Direct supervision/assist for financial management;Assist for transportation;Help with stairs or ramp for entrance    Equipment Recommendations None recommended by PT  Recommendations for Other Services       Functional Status Assessment Patient has had a recent decline in their functional status and demonstrates the ability to make significant improvements in function  in a reasonable and predictable amount of time.     Precautions / Restrictions Precautions Precautions: Fall Restrictions Weight Bearing Restrictions: No      Mobility  Bed Mobility               General bed mobility comments: OOB in chair    Transfers Overall transfer level: Needs assistance Equipment used: Rolling walker (2 wheels) Transfers: Sit to/from Stand Sit to Stand: Min assist           General transfer comment: Min A to power up and steady    Ambulation/Gait Ambulation/Gait assistance: Min assist Gait Distance (Feet): 80 Feet Assistive device: Rolling walker (2 wheels) Gait Pattern/deviations: Step-through pattern, Decreased stride length Gait velocity: decreased Gait velocity interpretation: <1.8 ft/sec, indicate of risk for recurrent falls   General Gait Details: Decreased bilateral foot clearance, increased R foot ER, multimodal cues for sequencing/direction. MinA for balance with backwards walking towards chair  Stairs            Wheelchair Mobility    Modified Rankin (Stroke Patients Only)       Balance Overall balance assessment: Needs assistance Sitting-balance support: No upper extremity supported, Feet supported Sitting balance-Leahy Scale: Good     Standing balance support: Bilateral upper extremity supported, Reliant on assistive device for balance Standing balance-Leahy Scale: Poor                               Pertinent Vitals/Pain Pain Assessment Pain Assessment: No/denies pain    Home Living Family/patient expects to be discharged to:: Private residence Living Arrangements: Alone Available Help at Discharge: Family;Available PRN/intermittently Type of Home: House Home Access: Level entry       Home Layout: One level Home Equipment: Rollator (4  wheels);Cane - single point;Toilet riser;Grab bars - toilet;Grab bars - tub/shower;Hand held shower head;Wheelchair - Brewing technologist      Prior  Function Prior Level of Function : Independent/Modified Independent;Driving;History of Falls (last six months)             Mobility Comments: Reports using RW, has had several falls in past 6 months ADLs Comments: Reports independence     Hand Dominance   Dominant Hand: Right    Extremity/Trunk Assessment   Upper Extremity Assessment Upper Extremity Assessment: Defer to OT evaluation    Lower Extremity Assessment Lower Extremity Assessment: RLE deficits/detail;LLE deficits/detail RLE Deficits / Details: Strength 5/5 LLE Deficits / Details: Strength 5/5    Cervical / Trunk Assessment Cervical / Trunk Assessment: Kyphotic  Communication   Communication: No difficulties  Cognition Arousal/Alertness: Awake/alert Behavior During Therapy: WFL for tasks assessed/performed Overall Cognitive Status: Impaired/Different from baseline Area of Impairment: Memory, Following commands, Safety/judgement, Problem solving, Orientation                 Orientation Level: Time   Memory: Decreased short-term memory, Decreased recall of precautions Following Commands: Follows one step commands consistently, Follows one step commands with increased time Safety/Judgement: Decreased awareness of safety, Decreased awareness of deficits   Problem Solving: Slow processing, Requires verbal cues General Comments: Pt with poor awareness to safety and his abilities. Asking where his "tooth," is and searching the bathroom. Stating year is 2035. Able to correctly navigate room numbers        General Comments General comments (skin integrity, edema, etc.): VSS on RA    Exercises     Assessment/Plan    PT Assessment Patient needs continued PT services  PT Problem List Decreased strength;Decreased activity tolerance;Decreased balance;Decreased mobility;Decreased cognition;Decreased safety awareness       PT Treatment Interventions DME instruction;Gait training;Functional mobility  training;Therapeutic exercise;Therapeutic activities;Balance training;Patient/family education    PT Goals (Current goals can be found in the Care Plan section)  Acute Rehab PT Goals Patient Stated Goal: did not state PT Goal Formulation: With patient Time For Goal Achievement: 06/20/22 Potential to Achieve Goals: Good    Frequency Min 3X/week     Co-evaluation               AM-PAC PT "6 Clicks" Mobility  Outcome Measure Help needed turning from your back to your side while in a flat bed without using bedrails?: None Help needed moving from lying on your back to sitting on the side of a flat bed without using bedrails?: A Little Help needed moving to and from a bed to a chair (including a wheelchair)?: A Little Help needed standing up from a chair using your arms (e.g., wheelchair or bedside chair)?: A Little Help needed to walk in hospital room?: A Little Help needed climbing 3-5 steps with a railing? : A Lot 6 Click Score: 18    End of Session Equipment Utilized During Treatment: Gait belt Activity Tolerance: Patient tolerated treatment well Patient left: in chair;with call bell/phone within reach;with chair alarm set Nurse Communication: Mobility status PT Visit Diagnosis: Unsteadiness on feet (R26.81);Muscle weakness (generalized) (M62.81);History of falling (Z91.81);Difficulty in walking, not elsewhere classified (R26.2)    Time: 1517-6160 PT Time Calculation (min) (ACUTE ONLY): 15 min   Charges:   PT Evaluation $PT Eval Moderate Complexity: 1 Mod          Wyona Almas, PT, DPT Acute Rehabilitation Services Office [phone]   Deno Etienne 06/06/2022, 3:37 PM

## 2022-06-06 NOTE — Evaluation (Signed)
Occupational Therapy Evaluation Patient Details Name: Joshua [name] MRN: [id number] DOB: 04-Nov-1935 Today's Date: 06/06/2022   History of Present Illness 86 y.o. M admitted on 06/03/22 due to AMS and fall. Pt undergoing work up for severe Sepsis. PMH inlcudes CAD, severe AS, HTN, HLD, arthritis, GERD, cataracts.   Clinical Impression   Pt admitted for concerns listed above. PTA pt reported that he was independent with all ADL's and IADL's, including driving. At this time, pt presents with cognitive deficits, weakness, and balance deficits. He is requiring up to min A physically for ADL' tasks, however max A with verbal and tactile cuing for safety, problem solving, and sequencing. Recommending SNF to maximize independence and safety. OT will follow acutely.       Recommendations for follow up therapy are one component of a multi-disciplinary discharge planning process, led by the attending physician.  Recommendations may be updated based on patient status, additional functional criteria and insurance authorization.   Follow Up Recommendations  Skilled nursing-short term rehab (<3 hours/day)    Assistance Recommended at Discharge Frequent or constant Supervision/Assistance  Patient can return home with the following A little help with walking and/or transfers;A lot of help with bathing/dressing/bathroom;Assistance with cooking/housework;Direct supervision/assist for medications management;Direct supervision/assist for financial management    Functional Status Assessment  Patient has had a recent decline in their functional status and demonstrates the ability to make significant improvements in function in a reasonable and predictable amount of time.  Equipment Recommendations  None recommended by OT    Recommendations for Other Services       Precautions / Restrictions Precautions Precautions: Fall Restrictions Weight Bearing Restrictions: No      Mobility Bed Mobility Overal  bed mobility: Needs Assistance Bed Mobility: Supine to Sit     Supine to sit: Min assist     General bed mobility comments: Min A to pull to sit EOB    Transfers Overall transfer level: Needs assistance Equipment used: Rolling walker (2 wheels) Transfers: Sit to/from Stand Sit to Stand: Min assist           General transfer comment: Min A to power up and steady      Balance Overall balance assessment: Needs assistance Sitting-balance support: No upper extremity supported, Feet supported Sitting balance-Leahy Scale: Good     Standing balance support: Bilateral upper extremity supported, Reliant on assistive device for balance Standing balance-Leahy Scale: Poor                             ADL either performed or assessed with clinical judgement   ADL Overall ADL's : Needs assistance/impaired Eating/Feeding: Set up;Sitting   Grooming: Set up;Sitting   Upper Body Bathing: Min guard;Sitting   Lower Body Bathing: Minimal assistance;Sitting/lateral leans;Sit to/from stand   Upper Body Dressing : Min guard;Sitting   Lower Body Dressing: Minimal assistance;Sitting/lateral leans;Sit to/from stand   Toilet Transfer: Min guard;Minimal assistance;Ambulation   Toileting- Clothing Manipulation and Hygiene: Minimal assistance;Sitting/lateral lean;Sit to/from stand       Functional mobility during ADLs: Min guard;Minimal assistance;Rolling walker (2 wheels) General ADL Comments: Min guard to min A physical assist, max verbal cuing     Vision Baseline Vision/History: 1 Wears glasses Ability to See in Adequate Light: 0 Adequate Patient Visual Report: No change from baseline Vision Assessment?: No apparent visual deficits     Perception     Praxis      Pertinent Vitals/Pain Pain Assessment  Pain Assessment: No/denies pain     Hand Dominance Right   Extremity/Trunk Assessment Upper Extremity Assessment Upper Extremity Assessment: Generalized  weakness   Lower Extremity Assessment Lower Extremity Assessment: Defer to PT evaluation   Cervical / Trunk Assessment Cervical / Trunk Assessment: Kyphotic   Communication Communication Communication: No difficulties   Cognition Arousal/Alertness: Awake/alert Behavior During Therapy: WFL for tasks assessed/performed Overall Cognitive Status: Impaired/Different from baseline Area of Impairment: Memory, Following commands, Safety/judgement, Problem solving                     Memory: Decreased short-term memory, Decreased recall of precautions Following Commands: Follows one step commands consistently, Follows one step commands with increased time Safety/Judgement: Decreased awareness of safety, Decreased awareness of deficits   Problem Solving: Slow processing, Requires verbal cues General Comments: Pt with poor awareness to safety and his abilities     General Comments  VSS on RA    Exercises     Shoulder Instructions      Home Living Family/patient expects to be discharged to:: Private residence Living Arrangements: Alone Available Help at Discharge: Family;Available PRN/intermittently Type of Home: House Home Access: Level entry     Home Layout: One level     Bathroom Shower/Tub: Occupational psychologist: Handicapped height Bathroom Accessibility: Yes How Accessible: Accessible via walker Home Equipment: Rollator (4 wheels);Cane - single point;Toilet riser;Grab bars - toilet;Grab bars - tub/shower;Hand held shower head;Wheelchair - manual;Shower seat          Prior Functioning/Environment Prior Level of Function : Independent/Modified Independent;Driving;History of Falls (last six months)             Mobility Comments: Reports using RW, has had several falls in past 6 months ADLs Comments: Reports independence        OT Problem List: Decreased strength;Decreased activity tolerance;Impaired balance (sitting and/or  standing);Decreased cognition;Decreased safety awareness;Decreased knowledge of use of DME or AE      OT Treatment/Interventions: Self-care/ADL training;Therapeutic exercise;Energy conservation;DME and/or AE instruction;Therapeutic activities;Cognitive remediation/compensation;Patient/family education;Balance training    OT Goals(Current goals can be found in the care plan section) Acute Rehab OT Goals Patient Stated Goal: To go home OT Goal Formulation: With patient/family Time For Goal Achievement: 06/20/22 Potential to Achieve Goals: Good ADL Goals Pt/caregiver will Perform Home Exercise Program: Increased strength;Both right and left upper extremity;With theraband;With written HEP provided Additional ADL Goal #1: Pt will follow 3-4 multistep task in room to simulate basic cooking task at home with minimal cuing/compensatory techniques. Additional ADL Goal #2: Pt will complete medication managment task with min A/compensatory strategies.  OT Frequency: Min 2X/week    Co-evaluation              AM-PAC OT "6 Clicks" Daily Activity     Outcome Measure Help from another person eating meals?: A Little Help from another person taking care of personal grooming?: A Little Help from another person toileting, which includes using toliet, bedpan, or urinal?: A Little Help from another person bathing (including washing, rinsing, drying)?: A Little Help from another person to put on and taking off regular upper body clothing?: A Little Help from another person to put on and taking off regular lower body clothing?: A Little 6 Click Score: 18   End of Session Equipment Utilized During Treatment: Gait belt;Rolling walker (2 wheels) Nurse Communication: Mobility status  Activity Tolerance: Patient tolerated treatment well Patient left: in chair;with call bell/phone within reach;with chair alarm set  OT Visit Diagnosis: Unsteadiness on feet (R26.81);Other abnormalities of gait and mobility  (R26.89);Muscle weakness (generalized) (M62.81);Other symptoms and signs involving cognitive function                Time: 6384-6659 OT Time Calculation (min): 26 min Charges:  OT General Charges $OT Visit: 1 Visit OT Evaluation $OT Eval Moderate Complexity: 1 Mod OT Treatments $Self Care/Home Management : 8-22 mins  Sreeja Spies H., OTR/L Acute Rehabilitation  Alon Mazor Elane Krithika Tome 06/06/2022, 2:05 PM

## 2022-06-07 DIAGNOSIS — R410 Disorientation, unspecified: Secondary | ICD-10-CM | POA: Diagnosis not present

## 2022-06-07 DIAGNOSIS — R6520 Severe sepsis without septic shock: Secondary | ICD-10-CM | POA: Diagnosis not present

## 2022-06-07 DIAGNOSIS — G9341 Metabolic encephalopathy: Secondary | ICD-10-CM | POA: Diagnosis not present

## 2022-06-07 DIAGNOSIS — A419 Sepsis, unspecified organism: Secondary | ICD-10-CM | POA: Diagnosis not present

## 2022-06-07 LAB — COMPREHENSIVE METABOLIC PANEL
ALT: 28 U/L (ref 0–44)
AST: 50 U/L — ABNORMAL HIGH (ref 15–41)
Albumin: 3.1 g/dL — ABNORMAL LOW (ref 3.5–5.0)
Alkaline Phosphatase: 48 U/L (ref 38–126)
Anion gap: 8 (ref 5–15)
BUN: 11 mg/dL (ref 8–23)
CO2: 25 mmol/L (ref 22–32)
Calcium: 8.6 mg/dL — ABNORMAL LOW (ref 8.9–10.3)
Chloride: 105 mmol/L (ref 98–111)
Creatinine, Ser: 0.95 mg/dL (ref 0.61–1.24)
GFR, Estimated: >60 mL/min (ref >60)
Glucose, Bld: 114 mg/dL — ABNORMAL HIGH (ref 70–99)
Potassium: 3.3 mmol/L — ABNORMAL LOW (ref 3.5–5.1)
Sodium: 138 mmol/L (ref 135–145)
Total Bilirubin: 1.5 mg/dL — ABNORMAL HIGH (ref 0.3–1.2)
Total Protein: 5.8 g/dL — ABNORMAL LOW (ref 6.5–8.1)

## 2022-06-07 LAB — CBC WITH DIFFERENTIAL/PLATELET
Abs Immature Granulocytes: 0.02 10*3/uL (ref 0.00–0.07)
Basophils Absolute: 0 10*3/uL (ref 0.0–0.1)
Basophils Relative: 1 %
Eosinophils Absolute: 0.4 10*3/uL (ref 0.0–0.5)
Eosinophils Relative: 7 %
HCT: 31.1 % — ABNORMAL LOW (ref 39.0–52.0)
Hemoglobin: 11 g/dL — ABNORMAL LOW (ref 13.0–17.0)
Immature Granulocytes: 0 %
Lymphocytes Relative: 21 %
Lymphs Abs: 1.3 10*3/uL (ref 0.7–4.0)
MCH: 35 pg — ABNORMAL HIGH (ref 26.0–34.0)
MCHC: 35.4 g/dL (ref 30.0–36.0)
MCV: 99 fL (ref 80.0–100.0)
Monocytes Absolute: 0.7 10*3/uL (ref 0.1–1.0)
Monocytes Relative: 12 %
Neutro Abs: 3.6 10*3/uL (ref 1.7–7.7)
Neutrophils Relative %: 59 %
Platelets: 115 10*3/uL — ABNORMAL LOW (ref 150–400)
RBC: 3.14 MIL/uL — ABNORMAL LOW (ref 4.22–5.81)
RDW: 12.2 % (ref 11.5–15.5)
WBC: 6 10*3/uL (ref 4.0–10.5)
nRBC: 0 % (ref 0.0–0.2)

## 2022-06-07 LAB — MAGNESIUM: Magnesium: 1.7 mg/dL (ref 1.7–2.4)

## 2022-06-07 LAB — CK: Total CK: 831 U/L — ABNORMAL HIGH (ref 49–397)

## 2022-06-07 MED ORDER — POTASSIUM CHLORIDE 20 MEQ PO PACK
40.0000 meq | PACK | Freq: Once | ORAL | Status: AC
  Administered 2022-06-07: 40 meq via ORAL
  Filled 2022-06-07: qty 2

## 2022-06-07 MED ORDER — MAGNESIUM SULFATE 2 GM/50ML IV SOLN
2.0000 g | Freq: Once | INTRAVENOUS | Status: AC
  Administered 2022-06-07: 2 g via INTRAVENOUS
  Filled 2022-06-07: qty 50

## 2022-06-07 NOTE — NC FL2 (Signed)
[place] LEVEL OF CARE SCREENING TOOL     IDENTIFICATION  Patient Name: Joshua [name] Birthdate: 1936-05-01 Sex: male Admission Date (Current Location): 06/03/2022  [facility] and [place] Number:  Herbalist and Address:  [facility], [place] [address] [place], [place] [address]      Provider Number: [id number]  Attending Physician Name and Address:  Dwyane Dee, MD  Relative Name and Phone Number:       Current Level of Care: Hospital Recommended Level of Care: [place] Prior Approval Number:    Date Approved/Denied:   PASRR Number: [id number]  Discharge Plan: SNF    Current Diagnoses: Patient Active Problem List   Diagnosis Date Noted   Delirium 06/06/2022   Severe sepsis ([place]) 02/54/2706   Acute metabolic encephalopathy 23/76/2831   Rhabdomyolysis 06/04/2022   CAD (coronary artery disease) 06/04/2022   Normocytic anemia 06/04/2022   Urinary tract infection without hematuria    Dyslipidemia 11/25/2021   CHB (complete heart block) ([place]) 11/14/2021   S/P placement of cardiac pacemaker 11/14/2021   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   S/P aortic valve replacement with bioprosthetic valve 11/04/2021   Severe aortic stenosis    Infected abrasion of fifth toe, right, initial encounter 10/31/2019   Pain due to onychomycosis of toenails of both feet 04/25/2019   Porokeratosis 04/25/2019   Gastroesophageal reflux disease 04/22/2018   Moderate calcific aortic stenosis 08/14/2016   PVCs (premature ventricular contractions) 08/14/2016   Hypercholesterolemia 08/14/2016   DJD (degenerative joint disease) 08/14/2016   Essential hypertension 07/16/2016    Orientation RESPIRATION BLADDER Height & Weight     Self, Time, Situation, Place  Normal Incontinent Weight: 180 lb 5.4 oz (81.8 kg) Height:  5' 11.5" (181.6 cm)  BEHAVIORAL SYMPTOMS/MOOD NEUROLOGICAL BOWEL NUTRITION STATUS      Continent Diet (heart healthy)   AMBULATORY STATUS COMMUNICATION OF NEEDS Skin   Limited Assist Verbally Skin abrasions (left arm, foam dressing: lift every shift to assess, change PRN)                       Personal Care Assistance Level of Assistance  Bathing, Feeding, Dressing Bathing Assistance: Limited assistance Feeding assistance: Limited assistance Dressing Assistance: Limited assistance     Functional Limitations Info  Sight Sight Info: Impaired        SPECIAL CARE FACTORS FREQUENCY  PT (By licensed PT), OT (By licensed OT)     PT Frequency: 5x/wk OT Frequency: 5x/wk            Contractures Contractures Info: Not present    Additional Factors Info  Code Status, Allergies Code Status Info: Full Allergies Info: NKA           Current Medications (06/07/2022):  This is the current hospital active medication list Current Facility-Administered Medications  Medication Dose Route Frequency Provider Last Rate Last Admin   0.9 %  sodium chloride infusion   Intravenous Continuous Dwyane Dee, MD 125 mL/hr at 06/07/22 0749 New Bag at 06/07/22 0749   acetaminophen (TYLENOL) tablet 650 mg  650 mg Oral Q6H PRN Rise Patience, MD       Or   acetaminophen (TYLENOL) suppository 650 mg  650 mg Rectal Q6H PRN Rise Patience, MD       aspirin EC tablet 81 mg  81 mg Oral Daily Rise Patience, MD   81 mg at 06/07/22 1029   cefTRIAXone (ROCEPHIN)  1 g in sodium chloride 0.9 % 100 mL IVPB  1 g Intravenous Q24H Dwyane Dee, MD   Stopped at 06/06/22 1811   enoxaparin (LOVENOX) injection 40 mg  40 mg Subcutaneous Q24H Rise Patience, MD   40 mg at 06/07/22 1029   lisinopril (ZESTRIL) tablet 20 mg  20 mg Oral Daily Rise Patience, MD   20 mg at 06/07/22 1029   metoprolol tartrate (LOPRESSOR) tablet 12.5 mg  12.5 mg Oral BID Rise Patience, MD   12.5 mg at 06/07/22 1029   pantoprazole (PROTONIX) EC tablet 40 mg  40 mg Oral Daily Rise Patience, MD   40 mg at 06/07/22  1029     Discharge Medications: Please see discharge summary for a list of discharge medications.  Relevant Imaging Results:  Relevant Lab Results:   Additional Information SS#: [id number]  Geralynn Ochs, LCSW

## 2022-06-07 NOTE — Progress Notes (Signed)
Mobility Specialist Progress Note:   06/07/22 1015  Mobility  Activity Ambulated with assistance in room;Transferred from bed to chair  Level of Assistance Minimal assist, patient does 75% or more  Assistive Device Front wheel walker  Distance Ambulated (ft) 10 ft  Activity Response Tolerated well  $Mobility charge 1 Mobility   NT requesting assistance to help with bath and transfer to chair. Required minA to stand from EOB. Pt very tremulous today with gait, displayed improved mentation. Left in chair with all needs met, chair alarm on.   [place] Numbers Acute Rehab Secure Chat or Office Phone: [phone]

## 2022-06-07 NOTE — Progress Notes (Signed)
Progress Note    Joshua [name]   YBO:[id number]  DOB: 1935-11-04  DOA: 06/03/2022     3 PCP: Shelda Pal, DO  Initial CC: AMS, found down  Hospital Course: [name] is an 86 yo male with PMH HTN, HLD, GERD, arthritis, severe AS s/p AVR, CAD s/p CABG, CHB s/p pacer who was found on the floor at home in his closet partially undressed.  He was last known to be normal on 06/02/2022 in the afternoon and was found on 06/03/2022 in the evening.  He had a fever on arrival to the ER and underwent further work-up. Multiple imaging studies were performed including CXR, CT head, CT C-spine, and pelvis x-ray.  There were no acute findings.  CT head did show underlying generalized cerebral atrophy and chronic small vessel disease. UA was notable for small LE, greater than 50 WBC, few bacteria, hyaline casts. Lactic acid was initially elevated 2.2.  He was started on antibiotics and admitted for further evaluation.  Interval History:  No events overnight.  Mentation still improved further today.  Comfortably resting in bed and had just finished eating breakfast.  Oriented easily to the year this morning which he struggled with over the past couple days.  Assessment and Plan: * Severe sepsis (HCC)-resolved as of 06/07/2022 - Febrile, tachycardia, tachypnea, lactic acidosis.  Presumed urinary source - UA does show some signs concerning for infection - Procalcitonin remained negative  - s/p fluids and trending lactic acid (now cleared) - d/c'd vanc; changed cefepime to rocephin  Rhabdomyolysis - CK 1,439 on admission; etiology presumed from being down on floor for about 1.5 days - CK trending (1439 >> 2533 >> 1865 >> 831) - d/c fluids this afternoon and allow to auto-regulate   Acute metabolic encephalopathy-resolved as of 06/07/2022 - patient symptoms include AMS, lethargy - etiology considered due to metabolic derangements and underlying sepsis  - NH3 normal  - continue  treatment  -Mentation continues to improve.  Delirium - Suspect his encephalopathy from UTI is close to resolved and now some lingering hospital-acquired delirium which is not unexpected.  Discussed this with his son as well - Patient remains calm, cooperative.  He is alert and oriented x3 -Continue delirium precautions.    Normocytic anemia - baseline Hgb 10-11 g/dL at baseline - currently at baseline  CAD (coronary artery disease) - Continue aspirin and beta-blocker - Statin on hold  CHB (complete heart block) ([place]) - Pacemaker in place  Essential hypertension - Slightly elevated blood pressures on admission.  Now better controlled - Continue Lopressor and lisinopril   Old records reviewed in assessment of this patient  Antimicrobials: Vanc 06/04/22 >> 06/05/2022 Cefepime 06/04/22 >> 06/05/2022 Rocephin 06/05/2022 >> 8/20  DVT prophylaxis:  enoxaparin (LOVENOX) injection 40 mg Start: 06/04/22 1000   Code Status:   Code Status: Full Code  Mobility Assessment (last 72 hours)     Mobility Assessment     Row Name 06/06/22 1944 06/06/22 1534 06/06/22 1300 06/06/22 1100 06/05/22 0815   Does patient have an order for bedrest or is patient medically unstable No - Continue assessment -- -- No - Continue assessment No - Continue assessment   What is the highest level of mobility based on the progressive mobility assessment? Level 5 (Walks with assist in room/hall) - Balance while stepping forward/back and can walk in room with assist - Complete Level 5 (Walks with assist in room/hall) - Balance while stepping forward/back and can walk in room with assist -  Complete Level 5 (Walks with assist in room/hall) - Balance while stepping forward/back and can walk in room with assist - Complete Level 5 (Walks with assist in room/hall) - Balance while stepping forward/back and can walk in room with assist - Complete Level 3 (Stands with assist) - Balance while standing  and cannot march in place    Is the above level different from baseline mobility prior to current illness? Yes - Recommend PT order -- -- Yes - Recommend PT order Yes - Recommend PT order    [place] Name 06/04/22 2114           Does patient have an order for bedrest or is patient medically unstable No - Continue assessment       What is the highest level of mobility based on the progressive mobility assessment? Level 3 (Stands with assist) - Balance while standing  and cannot march in place       Is the above level different from baseline mobility prior to current illness? Yes - Recommend PT order                Barriers to discharge: none Disposition Plan:  SNF but patient to discuss with family  Status is: Inpt  Objective: Blood pressure (!) 145/65, pulse 60, temperature 98.4 F (36.9 C), temperature source Oral, resp. rate 17, height 5' 11.5" (1.816 m), weight 81.8 kg, SpO2 100 %.  Examination:  Physical Exam Constitutional:      General: He is not in acute distress.    Comments: Much less fatigued appearing  HENT:     Head: Normocephalic and atraumatic.     Mouth/Throat:     Mouth: Mucous membranes are moist.  Eyes:     Extraocular Movements: Extraocular movements intact.  Cardiovascular:     Rate and Rhythm: Normal rate and regular rhythm.  Pulmonary:     Effort: Pulmonary effort is normal. No respiratory distress.     Breath sounds: Normal breath sounds. No wheezing.  Abdominal:     General: Bowel sounds are normal. There is no distension.     Palpations: Abdomen is soft.     Tenderness: There is no abdominal tenderness.  Musculoskeletal:        General: Normal range of motion.     Cervical back: Normal range of motion and neck supple.  Skin:    General: Skin is warm and dry.  Neurological:     Mental Status: He is alert.     Comments: Now is oriented x 3 (name, place, year). Answers more direct and clear. No focal deficits   Psychiatric:        Mood and Affect: Mood normal.       Consultants:    Procedures:    Data Reviewed: Results for orders placed or performed during the hospital encounter of 06/03/22 (from the past 24 hour(s))  CBC with Differential/Platelet     Status: Abnormal   Collection Time: 06/07/22  1:41 AM  Result Value Ref Range   WBC 6.0 4.0 - 10.5 K/uL   RBC 3.14 (L) 4.22 - 5.81 MIL/uL   Hemoglobin 11.0 (L) 13.0 - 17.0 g/dL   HCT 31.1 (L) 39.0 - 52.0 %   MCV 99.0 80.0 - 100.0 fL   MCH 35.0 (H) 26.0 - 34.0 pg   MCHC 35.4 30.0 - 36.0 g/dL   RDW 12.2 11.5 - 15.5 %   Platelets 115 (L) 150 - 400 K/uL   nRBC 0.0 0.0 -  0.2 %   Neutrophils Relative % 59 %   Neutro Abs 3.6 1.7 - 7.7 K/uL   Lymphocytes Relative 21 %   Lymphs Abs 1.3 0.7 - 4.0 K/uL   Monocytes Relative 12 %   Monocytes Absolute 0.7 0.1 - 1.0 K/uL   Eosinophils Relative 7 %   Eosinophils Absolute 0.4 0.0 - 0.5 K/uL   Basophils Relative 1 %   Basophils Absolute 0.0 0.0 - 0.1 K/uL   Immature Granulocytes 0 %   Abs Immature Granulocytes 0.02 0.00 - 0.07 K/uL  Comprehensive metabolic panel     Status: Abnormal   Collection Time: 06/07/22  1:41 AM  Result Value Ref Range   Sodium 138 135 - 145 mmol/L   Potassium 3.3 (L) 3.5 - 5.1 mmol/L   Chloride 105 98 - 111 mmol/L   CO2 25 22 - 32 mmol/L   Glucose, Bld 114 (H) 70 - 99 mg/dL   BUN 11 8 - 23 mg/dL   Creatinine, Ser 0.95 0.61 - 1.24 mg/dL   Calcium 8.6 (L) 8.9 - 10.3 mg/dL   Total Protein 5.8 (L) 6.5 - 8.1 g/dL   Albumin 3.1 (L) 3.5 - 5.0 g/dL   AST 50 (H) 15 - 41 U/L   ALT 28 0 - 44 U/L   Alkaline Phosphatase 48 38 - 126 U/L   Total Bilirubin 1.5 (H) 0.3 - 1.2 mg/dL   GFR, Estimated >60 >60 mL/min   Anion gap 8 5 - 15  Magnesium     Status: None   Collection Time: 06/07/22  1:41 AM  Result Value Ref Range   Magnesium 1.7 1.7 - 2.4 mg/dL  CK     Status: Abnormal   Collection Time: 06/07/22  1:41 AM  Result Value Ref Range   Total CK 831 (H) 49 - 397 U/L    I have Reviewed nursing notes, Vitals, and Lab results  since pt's last encounter. Pertinent lab results : see above I have ordered test including BMP, CBC, Mg I have reviewed the last note from staff over past 24 hours I have discussed pt's care plan and test results with nursing staff, case manager   LOS: 3 days   Dwyane Dee, MD [facility] 06/07/2022, 3:28 PM

## 2022-06-07 NOTE — Plan of Care (Signed)
  Problem: Nutrition: Goal: Adequate nutrition will be maintained Outcome: Progressing   Problem: Pain Managment: Goal: General experience of comfort will improve Outcome: Progressing   Problem: Safety: Goal: Ability to remain free from injury will improve Outcome: Progressing   

## 2022-06-07 NOTE — TOC Initial Note (Signed)
Transition of Care [facility]) - Initial/Assessment Note    Patient Details  Name: Joshua [name] MRN: [id number] Date of Birth: 25-Sep-1936  Transition of Care Joshua [name]) CM/SW Contact:    Geralynn Ochs, LCSW Phone Number: 06/07/2022, 11:45 AM  Clinical Narrative:             CSW noting per chart review patient experiencing some delirium/fluctuating orientation, so contacted patient's son, Joshua [name], to discuss recommendation for SNF placement. Son in agreement, discussed CMS list of choices. Family will be touring [place] for possible long term care afterwards, if patient will be agreeable. Patient has long term care insurance to cover costs. CSW received permission to fax out referral, and will follow up with bed offers.      Expected Discharge Plan: Skilled Nursing Facility Barriers to Discharge: Continued Medical Work up, Ship broker   Patient Goals and CMS Choice Patient states their goals for this hospitalization and ongoing recovery are:: to go home CMS Medicare.gov Compare Post Acute Care list provided to:: Patient Represenative (must comment) Choice offered to / list presented to : Adult Children  Expected Discharge Plan and Services Expected Discharge Plan: [place] Acute Care Choice: [place] arrangements for the past 2 months: Single Family Home                                      Prior Living Arrangements/Services Living arrangements for the past 2 months: Single Family Home Lives with:: Self Patient language and need for interpreter reviewed:: No Do you feel safe going back to the place where you live?: Yes      Need for Family Participation in Patient Care: Yes (Comment) Care giver support system in place?: No (comment)   Criminal Activity/Legal Involvement Pertinent to Current Situation/Hospitalization: No - Comment as needed  Activities of Daily Living      Permission  Sought/Granted Permission sought to share information with : Facility Sport and exercise psychologist, Family Supports Permission granted to share information with : Yes, Verbal Permission Granted  Share Information with NAME: Joshua [name]  Permission granted to share info w AGENCY: SNF  Permission granted to share info w Relationship: Son     Emotional Assessment   Attitude/Demeanor/Rapport: Unable to Assess Affect (typically observed): Unable to Assess Orientation: : Fluctuating Orientation (Suspected and/or reported Sundowners) Alcohol / Substance Use: Not Applicable Psych Involvement: No (comment)  Admission diagnosis:  Elevated troponin [R77.8] Elevated lactic acid level [R79.89] Fall, initial encounter [W19.XXXA] Head injury, initial encounter [S09.90XA] Sepsis ([place]) [A41.9] Urinary tract infection without hematuria, site unspecified [N39.0] Patient Active Problem List   Diagnosis Date Noted   Delirium 06/06/2022   Severe sepsis ([place]) 44/12/4740   Acute metabolic encephalopathy 59/56/3875   Rhabdomyolysis 06/04/2022   CAD (coronary artery disease) 06/04/2022   Normocytic anemia 06/04/2022   Urinary tract infection without hematuria    Dyslipidemia 11/25/2021   CHB (complete heart block) ([place]) 11/14/2021   S/P placement of cardiac pacemaker 11/14/2021   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   S/P aortic valve replacement with bioprosthetic valve 11/04/2021   Severe aortic stenosis    Infected abrasion of fifth toe, right, initial encounter 10/31/2019   Pain due to onychomycosis of toenails of both feet 04/25/2019   Porokeratosis 04/25/2019   Gastroesophageal reflux disease 04/22/2018   Moderate calcific aortic stenosis 08/14/2016   PVCs (premature ventricular contractions) 08/14/2016  Hypercholesterolemia 08/14/2016   DJD (degenerative joint disease) 08/14/2016   Essential hypertension 07/16/2016   PCP:  Shelda Pal, DO Pharmacy:   [place], [place] [address] Phone: [phone] Fax: [phone]  [id number] - [place], [place] - [address] AT [place] [address] [place] [address] Phone: [phone] Fax: [phone]     Social Determinants of Health (SDOH) Interventions    Readmission Risk Interventions     No data to display

## 2022-06-08 DIAGNOSIS — M6282 Rhabdomyolysis: Secondary | ICD-10-CM | POA: Diagnosis not present

## 2022-06-08 DIAGNOSIS — R6520 Severe sepsis without septic shock: Secondary | ICD-10-CM | POA: Diagnosis not present

## 2022-06-08 DIAGNOSIS — G9341 Metabolic encephalopathy: Secondary | ICD-10-CM | POA: Diagnosis not present

## 2022-06-08 DIAGNOSIS — A419 Sepsis, unspecified organism: Secondary | ICD-10-CM | POA: Diagnosis not present

## 2022-06-08 LAB — COMPREHENSIVE METABOLIC PANEL
ALT: 23 U/L (ref 0–44)
AST: 38 U/L (ref 15–41)
Albumin: 2.9 g/dL — ABNORMAL LOW (ref 3.5–5.0)
Alkaline Phosphatase: 44 U/L (ref 38–126)
Anion gap: 4 — ABNORMAL LOW (ref 5–15)
BUN: 11 mg/dL (ref 8–23)
CO2: 28 mmol/L (ref 22–32)
Calcium: 8.6 mg/dL — ABNORMAL LOW (ref 8.9–10.3)
Chloride: 106 mmol/L (ref 98–111)
Creatinine, Ser: 0.84 mg/dL (ref 0.61–1.24)
GFR, Estimated: >60 mL/min (ref >60)
Glucose, Bld: 106 mg/dL — ABNORMAL HIGH (ref 70–99)
Potassium: 3.7 mmol/L (ref 3.5–5.1)
Sodium: 138 mmol/L (ref 135–145)
Total Bilirubin: 0.9 mg/dL (ref 0.3–1.2)
Total Protein: 5.8 g/dL — ABNORMAL LOW (ref 6.5–8.1)

## 2022-06-08 LAB — CBC WITH DIFFERENTIAL/PLATELET
Abs Immature Granulocytes: 0.03 10*3/uL (ref 0.00–0.07)
Basophils Absolute: 0.1 10*3/uL (ref 0.0–0.1)
Basophils Relative: 1 %
Eosinophils Absolute: 0.4 10*3/uL (ref 0.0–0.5)
Eosinophils Relative: 7 %
HCT: 29.7 % — ABNORMAL LOW (ref 39.0–52.0)
Hemoglobin: 10.5 g/dL — ABNORMAL LOW (ref 13.0–17.0)
Immature Granulocytes: 1 %
Lymphocytes Relative: 25 %
Lymphs Abs: 1.4 10*3/uL (ref 0.7–4.0)
MCH: 34.8 pg — ABNORMAL HIGH (ref 26.0–34.0)
MCHC: 35.4 g/dL (ref 30.0–36.0)
MCV: 98.3 fL (ref 80.0–100.0)
Monocytes Absolute: 0.7 10*3/uL (ref 0.1–1.0)
Monocytes Relative: 12 %
Neutro Abs: 3.1 10*3/uL (ref 1.7–7.7)
Neutrophils Relative %: 54 %
Platelets: 142 10*3/uL — ABNORMAL LOW (ref 150–400)
RBC: 3.02 MIL/uL — ABNORMAL LOW (ref 4.22–5.81)
RDW: 12.2 % (ref 11.5–15.5)
WBC: 5.8 10*3/uL (ref 4.0–10.5)
nRBC: 0 % (ref 0.0–0.2)

## 2022-06-08 LAB — MAGNESIUM: Magnesium: 1.9 mg/dL (ref 1.7–2.4)

## 2022-06-08 LAB — CK: Total CK: 359 U/L (ref 49–397)

## 2022-06-08 NOTE — Progress Notes (Signed)
Mobility Specialist Progress Note:   06/08/22 1615  Mobility  Activity Ambulated with assistance in room  Level of Assistance Contact guard assist, steadying assist  Assistive Device Front wheel walker  Distance Ambulated (ft) 50 ft  Activity Response Tolerated well  $Mobility charge 1 Mobility   Pt received in bed and agreeable. No complaints. Pt left in chair with chair alarm on, call bell in reach, and all needs met.   Deaven Urwin Mobility Specialist-Acute Rehab Secure Chat only

## 2022-06-08 NOTE — Care Management Important Message (Signed)
Important Message  Patient Details  Name: Joshua [name] MRN: [id number] Date of Birth: 10/04/1936   Medicare Important Message Given:  Yes     Hannah Beat 06/08/2022, 12:24 PM

## 2022-06-08 NOTE — Progress Notes (Signed)
Progress Note    Joshua [name]   BHA:[id number]  DOB: 1936-04-19  DOA: 06/03/2022     4 PCP: Shelda Pal, DO  Initial CC: AMS, found down  Hospital Course: Joshua [name] is an 86 yo male with PMH HTN, HLD, GERD, arthritis, severe AS s/p AVR, CAD s/p CABG, CHB s/p pacer who was found on the floor at home in his closet partially undressed.  He was last known to be normal on 06/02/2022 in the afternoon and was found on 06/03/2022 in the evening.  He had a fever on arrival to the ER and underwent further work-up. Multiple imaging studies were performed including CXR, CT head, CT C-spine, and pelvis x-ray.  There were no acute findings.  CT head did show underlying generalized cerebral atrophy and chronic small vessel disease. UA was notable for small LE, greater than 50 WBC, few bacteria, hyaline casts. Lactic acid was initially elevated 2.2.  He was started on antibiotics and admitted for further evaluation.  Interval History:  No events overnight.  Mentation still improved further today.  Awaiting SNF beds. Called and spoke with son for update this morning.  Assessment and Plan: * Severe sepsis (HCC)-resolved as of 06/07/2022 - Febrile, tachycardia, tachypnea, lactic acidosis.  Presumed urinary source - UA does show some signs concerning for infection - Procalcitonin remained negative  - s/p fluids and trending lactic acid (now cleared) - d/c'd vanc; changed cefepime to rocephin  Rhabdomyolysis - CK 1,439 on admission; etiology presumed from being down on floor for about 1.5 days - CK trending (1439 >> 2533 >> 1865 >> 831>>359) - d/c fluids on 9/73  Acute metabolic encephalopathy-resolved as of 06/07/2022 - patient symptoms include AMS, lethargy - etiology considered due to metabolic derangements and underlying sepsis  - NH3 normal  - continue treatment  -Mentation continues to improve.  Delirium - Suspect his encephalopathy from UTI is close to resolved and now  some lingering hospital-acquired delirium which is not unexpected.  Discussed this with his son as well - Patient remains calm, cooperative.  He is alert and oriented x3 -Continue delirium precautions.    Normocytic anemia - baseline Hgb 10-11 g/dL at baseline - currently at baseline  CAD (coronary artery disease) - Continue aspirin and beta-blocker - Statin on hold  CHB (complete heart block) ([place]) - Pacemaker in place  Essential hypertension - Slightly elevated blood pressures on admission.  Now better controlled - Continue Lopressor and lisinopril   Old records reviewed in assessment of this patient  Antimicrobials: Vanc 06/04/22 >> 06/05/2022 Cefepime 06/04/22 >> 06/05/2022 Rocephin 06/05/2022 >> 8/20  DVT prophylaxis:  enoxaparin (LOVENOX) injection 40 mg Start: 06/04/22 1000   Code Status:   Code Status: Full Code  Mobility Assessment (last 72 hours)     Mobility Assessment     Row Name 06/07/22 2349 06/07/22 0757 06/06/22 1944 06/06/22 1534 06/06/22 1300   Does patient have an order for bedrest or is patient medically unstable No - Continue assessment No - Continue assessment No - Continue assessment -- --   What is the highest level of mobility based on the progressive mobility assessment? Level 5 (Walks with assist in room/hall) - Balance while stepping forward/back and can walk in room with assist - Complete Level 5 (Walks with assist in room/hall) - Balance while stepping forward/back and can walk in room with assist - Complete Level 5 (Walks with assist in room/hall) - Balance while stepping forward/back and can walk in room with  assist - Complete Level 5 (Walks with assist in room/hall) - Balance while stepping forward/back and can walk in room with assist - Complete Level 5 (Walks with assist in room/hall) - Balance while stepping forward/back and can walk in room with assist - Complete   Is the above level different from baseline mobility prior to current illness?  Yes - Recommend PT order Yes - Recommend PT order Yes - Recommend PT order -- --    [place] Name 06/06/22 1100           Does patient have an order for bedrest or is patient medically unstable No - Continue assessment       What is the highest level of mobility based on the progressive mobility assessment? Level 5 (Walks with assist in room/hall) - Balance while stepping forward/back and can walk in room with assist - Complete       Is the above level different from baseline mobility prior to current illness? Yes - Recommend PT order                Barriers to discharge: none Disposition Plan:  SNF  when bed avail; medically ready  Status is: Inpt  Objective: Blood pressure (!) 144/67, pulse 70, temperature 98.6 F (37 C), temperature source Oral, resp. rate 16, height 5' 11.5" (1.816 m), weight 81.8 kg, SpO2 100 %.  Examination:  Physical Exam Constitutional:      General: He is not in acute distress.    Comments: Much less fatigued appearing  HENT:     Head: Normocephalic and atraumatic.     Mouth/Throat:     Mouth: Mucous membranes are moist.  Eyes:     Extraocular Movements: Extraocular movements intact.  Cardiovascular:     Rate and Rhythm: Normal rate and regular rhythm.  Pulmonary:     Effort: Pulmonary effort is normal. No respiratory distress.     Breath sounds: Normal breath sounds. No wheezing.  Abdominal:     General: Bowel sounds are normal. There is no distension.     Palpations: Abdomen is soft.     Tenderness: There is no abdominal tenderness.  Musculoskeletal:        General: Normal range of motion.     Cervical back: Normal range of motion and neck supple.  Skin:    General: Skin is warm and dry.  Neurological:     Mental Status: He is alert.     Comments: Now is oriented x 3 (name, place, year). Answers more direct and clear. No focal deficits   Psychiatric:        Mood and Affect: Mood normal.      Consultants:    Procedures:    Data  Reviewed: Results for orders placed or performed during the hospital encounter of 06/03/22 (from the past 24 hour(s))  CBC with Differential/Platelet     Status: Abnormal   Collection Time: 06/08/22  1:49 AM  Result Value Ref Range   WBC 5.8 4.0 - 10.5 K/uL   RBC 3.02 (L) 4.22 - 5.81 MIL/uL   Hemoglobin 10.5 (L) 13.0 - 17.0 g/dL   HCT 29.7 (L) 39.0 - 52.0 %   MCV 98.3 80.0 - 100.0 fL   MCH 34.8 (H) 26.0 - 34.0 pg   MCHC 35.4 30.0 - 36.0 g/dL   RDW 12.2 11.5 - 15.5 %   Platelets 142 (L) 150 - 400 K/uL   nRBC 0.0 0.0 - 0.2 %   Neutrophils Relative %  54 %   Neutro Abs 3.1 1.7 - 7.7 K/uL   Lymphocytes Relative 25 %   Lymphs Abs 1.4 0.7 - 4.0 K/uL   Monocytes Relative 12 %   Monocytes Absolute 0.7 0.1 - 1.0 K/uL   Eosinophils Relative 7 %   Eosinophils Absolute 0.4 0.0 - 0.5 K/uL   Basophils Relative 1 %   Basophils Absolute 0.1 0.0 - 0.1 K/uL   Immature Granulocytes 1 %   Abs Immature Granulocytes 0.03 0.00 - 0.07 K/uL  Comprehensive metabolic panel     Status: Abnormal   Collection Time: 06/08/22  1:49 AM  Result Value Ref Range   Sodium 138 135 - 145 mmol/L   Potassium 3.7 3.5 - 5.1 mmol/L   Chloride 106 98 - 111 mmol/L   CO2 28 22 - 32 mmol/L   Glucose, Bld 106 (H) 70 - 99 mg/dL   BUN 11 8 - 23 mg/dL   Creatinine, Ser 0.84 0.61 - 1.24 mg/dL   Calcium 8.6 (L) 8.9 - 10.3 mg/dL   Total Protein 5.8 (L) 6.5 - 8.1 g/dL   Albumin 2.9 (L) 3.5 - 5.0 g/dL   AST 38 15 - 41 U/L   ALT 23 0 - 44 U/L   Alkaline Phosphatase 44 38 - 126 U/L   Total Bilirubin 0.9 0.3 - 1.2 mg/dL   GFR, Estimated >60 >60 mL/min   Anion gap 4 (L) 5 - 15  Magnesium     Status: None   Collection Time: 06/08/22  1:49 AM  Result Value Ref Range   Magnesium 1.9 1.7 - 2.4 mg/dL  CK     Status: None   Collection Time: 06/08/22  1:49 AM  Result Value Ref Range   Total CK 359 49 - 397 U/L    I have Reviewed nursing notes, Vitals, and Lab results since pt's last encounter. Pertinent lab results : see above I  have ordered test including BMP, CBC, Mg I have reviewed the last note from staff over past 24 hours I have discussed pt's care plan and test results with nursing staff, case manager   LOS: 4 days   Dwyane Dee, MD [facility] 06/08/2022, 2:56 PM

## 2022-06-08 NOTE — TOC Progression Note (Signed)
Transition of Care [facility]) - Progression Note    Patient Details  Name: Joshua [name] MRN: [id number] Date of Birth: Jan 15, 1936  Transition of Care [facility]) CM/SW Contact  Reece Agar, [place] Phone Number: 06/08/2022, 1:00 PM  Clinical Narrative:    CSW spoke with pt son about SNF options, pt son asked CSW to send him the acceptances via email since he will not be back to visit until laterthis evening to [email or web address]. CSW sent e-mail  and requested that son call back with choice facility.    Expected Discharge Plan: [place] Barriers to Discharge: Continued Medical Work up, Ship broker  Expected Discharge Plan and Services Expected Discharge Plan: [place] Choice: [place] arrangements for the past 2 months: Single Family Home                                       Social Determinants of Health (SDOH) Interventions    Readmission Risk Interventions     No data to display

## 2022-06-09 DIAGNOSIS — D649 Anemia, unspecified: Secondary | ICD-10-CM

## 2022-06-09 DIAGNOSIS — R410 Disorientation, unspecified: Secondary | ICD-10-CM | POA: Diagnosis not present

## 2022-06-09 DIAGNOSIS — Z951 Presence of aortocoronary bypass graft: Secondary | ICD-10-CM

## 2022-06-09 DIAGNOSIS — I10 Essential (primary) hypertension: Secondary | ICD-10-CM | POA: Diagnosis not present

## 2022-06-09 DIAGNOSIS — A419 Sepsis, unspecified organism: Secondary | ICD-10-CM | POA: Diagnosis not present

## 2022-06-09 DIAGNOSIS — T796XXA Traumatic ischemia of muscle, initial encounter: Secondary | ICD-10-CM

## 2022-06-09 DIAGNOSIS — G9341 Metabolic encephalopathy: Secondary | ICD-10-CM | POA: Diagnosis not present

## 2022-06-09 DIAGNOSIS — Z953 Presence of xenogenic heart valve: Secondary | ICD-10-CM

## 2022-06-09 LAB — CULTURE, BLOOD (ROUTINE X 2)
Culture: NO GROWTH
Culture: NO GROWTH
Special Requests: ADEQUATE
Special Requests: ADEQUATE

## 2022-06-09 NOTE — Progress Notes (Signed)
PROGRESS NOTE    Joshua [name]  YKD:[id number] DOB: 05-25-1936 DOA: 06/03/2022 PCP: Shelda Pal, DO   Brief Narrative: Joshua [name] is a 86 y.o. male with a history of hypertension, hyperlipidemia, GERD, arthritis, severe aortic stenosis s/p AVR, CAD s/p CABG, complete heart block s/p pacer. Patient presented secondary to being found down on the floor and altered. Patient found to have evidence of acute metabolic encephalopathy, rhabdomyolysis and sepsis presumed secondary to a UTI. Patient was started on IV fluids and IV antibiotics with improvement of mentation. Stable for discharge to SNF on 8/21.   Assessment and Plan: * Severe sepsis (HCC)-resolved as of 06/07/2022 Present on admission. Secondary to urinary source. Blood cultures negative. No urine culture obtained on admission. Patient treated with Vancomycin/Cefepime empirically and was transitioned to Ceftriaxone. Completed antibiotic course.  Traumatic rhabdomyolysis (HCC)-resolved as of 06/09/2022 Traumatic. CK of 1,439 on admission. In setting of being found down. Patient managed on lactated ringers with daily improvement in CK. IV fluids discontinued on 8/20.  Acute metabolic encephalopathy-resolved as of 06/07/2022 Presumed secondary to infection and likely etiology of fall. Mentation improved with treatment of underlying infection.  Delirium Likely from UTI in addition to hospital acquired delirium. Significantly improved and appears to have resolved.  Normocytic anemia Baseline hemoglobin of 9-10. Hemoglobin of 12.2 on admission but down to 10-11 after IV fluids. Hemoglobin stable.  CAD (coronary artery disease) No chest pain. -Continue aspirin and metoprolol -Home Lipitor held secondary to rhabdomyolysis  CHB (complete heart block) ([place]) S/p pacemaker. Noted.  Essential hypertension -Continue metoprolol and lisinopril    DVT prophylaxis: Lovenox Code Status:   Code Status: Full Code Family  Communication: None at bedside Disposition Plan: Medically stable for discharge as of 8/21. Discharge to SNF when bed is available.   Consultants:  None  Procedures:  None  Antimicrobials: Vancomycin Cefepime Ceftriaxone    Subjective: Patient reports no concerns this morning. Feels well.  Objective: BP (!) 152/70 (BP Location: Left Arm)   Pulse 69   Temp 98 F (36.7 C)   Resp 18   Ht 5' 11.5" (1.816 m)   Wt 81.8 kg   SpO2 99%   BMI 24.80 kg/m   Examination:  General exam: Appears calm and comfortable Respiratory system: Clear to auscultation. Respiratory effort normal. Cardiovascular system: S1 & S2 heard. Gastrointestinal system: Abdomen is nondistended, soft and nontender. No organomegaly or masses felt. Normal bowel sounds heard. Central nervous system: Alert. Musculoskeletal: No calf tenderness Skin: No cyanosis. No rashes   Data Reviewed: I have personally reviewed following labs and imaging studies  CBC Lab Results  Component Value Date   WBC 5.8 06/08/2022   RBC 3.02 (L) 06/08/2022   HGB 10.5 (L) 06/08/2022   HCT 29.7 (L) 06/08/2022   MCV 98.3 06/08/2022   MCH 34.8 (H) 06/08/2022   PLT 142 (L) 06/08/2022   MCHC 35.4 06/08/2022   RDW 12.2 06/08/2022   LYMPHSABS 1.4 06/08/2022   MONOABS 0.7 06/08/2022   EOSABS 0.4 06/08/2022   BASOSABS 0.1 39/76/7341     Last metabolic panel Lab Results  Component Value Date   NA 138 06/08/2022   K 3.7 06/08/2022   CL 106 06/08/2022   CO2 28 06/08/2022   BUN 11 06/08/2022   CREATININE 0.84 06/08/2022   GLUCOSE 106 (H) 06/08/2022   GFRNONAA >60 06/08/2022   GFRAA >60 07/11/2016   CALCIUM 8.6 (L) 06/08/2022   PHOS 3.5 11/12/2021   PROT 5.8 (L)  06/08/2022   ALBUMIN 2.9 (L) 06/08/2022   BILITOT 0.9 06/08/2022   ALKPHOS 44 06/08/2022   AST 38 06/08/2022   ALT 23 06/08/2022   ANIONGAP 4 (L) 06/08/2022    GFR: Estimated Creatinine Clearance: 72.1 mL/min (by C-G formula based on SCr of 0.84  mg/dL).  Recent Results (from the past 240 hour(s))  Culture, blood (routine x 2)     Status: None   Collection Time: 06/04/22 12:22 AM   Specimen: BLOOD  Result Value Ref Range Status   Specimen Description BLOOD RIGHT ANTECUBITAL  Final   Special Requests   Final    BOTTLES DRAWN AEROBIC AND ANAEROBIC Blood Culture adequate volume   Culture   Final    NO GROWTH 5 DAYS Performed at [facility], [address], [place], [place] [address]    Report Status 06/09/2022 FINAL  Final  Culture, blood (routine x 2)     Status: None   Collection Time: 06/04/22 12:27 AM   Specimen: BLOOD RIGHT HAND  Result Value Ref Range Status   Specimen Description BLOOD RIGHT HAND  Final   Special Requests   Final    BOTTLES DRAWN AEROBIC AND ANAEROBIC Blood Culture adequate volume   Culture   Final    NO GROWTH 5 DAYS Performed at [facility], [place] [address], [place], [place] [address]    Report Status 06/09/2022 FINAL  Final  SARS Coronavirus 2 by RT PCR (hospital order, performed in [facility] hospital lab) *cepheid single result test* Anterior Nasal Swab     Status: None   Collection Time: 06/04/22  5:20 AM   Specimen: Anterior Nasal Swab  Result Value Ref Range Status   SARS Coronavirus 2 by RT PCR NEGATIVE NEGATIVE Final    Comment: (NOTE) SARS-CoV-2 target nucleic acids are NOT DETECTED.  The SARS-CoV-2 RNA is generally detectable in upper and lower respiratory specimens during the acute phase of infection. The lowest concentration of SARS-CoV-2 viral copies this assay can detect is 250 copies / mL. A negative result does not preclude SARS-CoV-2 infection and should not be used as the sole basis for treatment or other patient management decisions.  A negative result may occur with improper specimen collection / handling, submission of specimen other than nasopharyngeal swab, presence of viral mutation(s) within the areas targeted by this assay, and inadequate number of viral  copies (<250 copies / mL). A negative result must be combined with clinical observations, patient history, and epidemiological information.  Fact Sheet for Patients:   https://www.patel.info/  Fact Sheet for Healthcare Providers: https://hall.com/  This test is not yet approved or  cleared by the [place] FDA and has been authorized for detection and/or diagnosis of SARS-CoV-2 by FDA under an Emergency Use Authorization (EUA).  This EUA will remain in effect (meaning this test can be used) for the duration of the COVID-19 declaration under Section 564(b)(1) of the Act, 21 U.S.C. section 360bbb-3(b)(1), unless the authorization is terminated or revoked sooner.  Performed at [facility], [place] [address], [place], [place] [address]       Radiology Studies: No results found.    LOS: 5 days    Cordelia Poche, MD [facility] 06/09/2022, 3:44 PM   If 7PM-7AM, please contact night-coverage [email or web address]

## 2022-06-09 NOTE — Progress Notes (Signed)
Physical Therapy Treatment Patient Details Name: Joshua [name] MRN: [id number] DOB: [birth year] Today's Date: 06/09/2022   History of Present Illness 86 y.o. M admitted on 06/03/22 due to AMS and fall. Pt undergoing work up for severe Sepsis. PMH inlcudes CAD, severe AS, HTN, HLD, arthritis, GERD, cataracts.    PT Comments    Pt progressing well towards his physical therapy goals. Session focused on gait training, stretching and serial sit to stands for functional strengthening. Pt continues to present as a high fall risk based on decreased gait speed and history of falls. Continue to recommend SNF for ongoing Physical Therapy.      Recommendations for follow up therapy are one component of a multi-disciplinary discharge planning process, led by the attending physician.  Recommendations may be updated based on patient status, additional functional criteria and insurance authorization.  Follow Up Recommendations  Skilled nursing-short term rehab (<3 hours/day) Can patient physically be transported by private vehicle: Yes   Assistance Recommended at Discharge Frequent or constant Supervision/Assistance  Patient can return home with the following A little help with walking and/or transfers;A little help with bathing/dressing/bathroom;Assistance with cooking/housework;Direct supervision/assist for medications management;Direct supervision/assist for financial management;Assist for transportation;Help with stairs or ramp for entrance   Equipment Recommendations  None recommended by PT    Recommendations for Other Services       Precautions / Restrictions Precautions Precautions: Fall Restrictions Weight Bearing Restrictions: No     Mobility  Bed Mobility Overal bed mobility: Needs Assistance Bed Mobility: Supine to Sit     Supine to sit: Supervision     General bed mobility comments: No physical assist required    Transfers Overall transfer level: Needs  assistance Equipment used: Rolling walker (2 wheels) Transfers: Sit to/from Stand Sit to Stand: Supervision, Min guard           General transfer comment: Increased time to power up after multiple efforts edge of bed; rocking to gain momentum. min guard for safety. Subsequently able to practice from chair with supervision; pushing up from arms of chair with improved fluidity    Ambulation/Gait Ambulation/Gait assistance: Min guard Gait Distance (Feet): 200 Feet Assistive device: Rolling walker (2 wheels) Gait Pattern/deviations: Step-through pattern, Decreased dorsiflexion - right, Decreased dorsiflexion - left, Decreased stride length Gait velocity: decreased     General Gait Details: VC's provided for scapular depression, walker proximity, min guard overall for safety. Decreased bilateral heel strike at initial contact   Stairs             Wheelchair Mobility    Modified Rankin (Stroke Patients Only)       Balance Overall balance assessment: Needs assistance Sitting-balance support: No upper extremity supported, Feet supported Sitting balance-Leahy Scale: Good     Standing balance support: Bilateral upper extremity supported, Reliant on assistive device for balance Standing balance-Leahy Scale: Poor                              Cognition Arousal/Alertness: Awake/alert Behavior During Therapy: WFL for tasks assessed/performed Overall Cognitive Status: Impaired/Different from baseline Area of Impairment: Memory, Safety/judgement, Problem solving                     Memory: Decreased short-term memory, Decreased recall of precautions       Problem Solving: Slow processing, Requires verbal cues General Comments: Oriented to day of week        Exercises Other  Exercises Other Exercises: x5 serial sit to stands from chair for functional strengthening Other Exercises: Seated: manual bilateral gastroc stretch x 1 minute each     General Comments        Pertinent Vitals/Pain Pain Assessment Pain Assessment: Faces Faces Pain Scale: Hurts a little bit Pain Location: R gastroc during ambulation Pain Intervention(s): Monitored during session    Home Living                          Prior Function            PT Goals (current goals can now be found in the care plan section) Acute Rehab PT Goals Patient Stated Goal: agreeable to SNF Potential to Achieve Goals: Good Progress towards PT goals: Progressing toward goals    Frequency    Min 3X/week      PT Plan Current plan remains appropriate    Co-evaluation              AM-PAC PT "6 Clicks" Mobility   Outcome Measure  Help needed turning from your back to your side while in a flat bed without using bedrails?: None Help needed moving from lying on your back to sitting on the side of a flat bed without using bedrails?: A Little Help needed moving to and from a bed to a chair (including a wheelchair)?: A Little Help needed standing up from a chair using your arms (e.g., wheelchair or bedside chair)?: A Little Help needed to walk in hospital room?: A Little Help needed climbing 3-5 steps with a railing? : A Lot 6 Click Score: 18    End of Session Equipment Utilized During Treatment: Gait belt Activity Tolerance: Patient tolerated treatment well Patient left: in chair;with call bell/phone within reach;with chair alarm set Nurse Communication: Mobility status PT Visit Diagnosis: Unsteadiness on feet (R26.81);Muscle weakness (generalized) (M62.81);History of falling (Z91.81);Difficulty in walking, not elsewhere classified (R26.2)     Time: 6314-9702 PT Time Calculation (min) (ACUTE ONLY): 19 min  Charges:  $Gait Training: 8-22 mins                     Wyona Almas, PT, DPT Acute Rehabilitation Services Office [phone]    Joshua [name] 06/09/2022, 9:16 AM

## 2022-06-09 NOTE — Progress Notes (Signed)
Mobility Specialist Progress Note:   06/09/22 1200  Mobility  Activity Ambulated with assistance in hallway  Level of Assistance Contact guard assist, steadying assist  Assistive Device Front wheel walker  Distance Ambulated (ft) 200 ft  Activity Response Tolerated well  $Mobility charge 1 Mobility   Pt agreeable to mobility session. Required CGA for safety. No physical assist required. Pt back in chair with all needs met.   Nelta Numbers Acute Rehab Secure Chat or Office Phone: [phone]

## 2022-06-10 DIAGNOSIS — M6281 Muscle weakness (generalized): Secondary | ICD-10-CM | POA: Diagnosis not present

## 2022-06-10 DIAGNOSIS — I350 Nonrheumatic aortic (valve) stenosis: Secondary | ICD-10-CM | POA: Diagnosis not present

## 2022-06-10 DIAGNOSIS — A419 Sepsis, unspecified organism: Secondary | ICD-10-CM | POA: Diagnosis not present

## 2022-06-10 DIAGNOSIS — Z950 Presence of cardiac pacemaker: Secondary | ICD-10-CM | POA: Diagnosis not present

## 2022-06-10 DIAGNOSIS — E785 Hyperlipidemia, unspecified: Secondary | ICD-10-CM | POA: Diagnosis not present

## 2022-06-10 DIAGNOSIS — I455 Other specified heart block: Secondary | ICD-10-CM | POA: Diagnosis not present

## 2022-06-10 DIAGNOSIS — Z953 Presence of xenogenic heart valve: Secondary | ICD-10-CM | POA: Diagnosis not present

## 2022-06-10 DIAGNOSIS — R2681 Unsteadiness on feet: Secondary | ICD-10-CM | POA: Diagnosis not present

## 2022-06-10 DIAGNOSIS — R6520 Severe sepsis without septic shock: Secondary | ICD-10-CM | POA: Diagnosis not present

## 2022-06-10 DIAGNOSIS — R2689 Other abnormalities of gait and mobility: Secondary | ICD-10-CM | POA: Diagnosis not present

## 2022-06-10 DIAGNOSIS — I10 Essential (primary) hypertension: Secondary | ICD-10-CM | POA: Diagnosis not present

## 2022-06-10 DIAGNOSIS — R41841 Cognitive communication deficit: Secondary | ICD-10-CM | POA: Diagnosis not present

## 2022-06-10 DIAGNOSIS — I442 Atrioventricular block, complete: Secondary | ICD-10-CM | POA: Diagnosis not present

## 2022-06-10 NOTE — Progress Notes (Signed)
Joshua [name] to be D/C'd  per MD order.  Discussed with Monet at [facility] and all questions fully answered.  VSS, Skin clean, dry and intact without evidence of skin break down, no evidence of skin tears noted.  IV catheter discontinued intact. Site without signs and symptoms of complications. Dressing and pressure applied.  An After Visit Summary was printed and given to the patient.  D/c education completed with patient/family including follow up instructions, medication list, d/c activities limitations if indicated, with other d/c instructions as indicated by MD - patient able to verbalize understanding, all questions fully answered.   Patient instructed to return to ED, call 911, or call MD for any changes in condition.   Patient to be escorted via [place], and D/C TO [employer] via private auto.

## 2022-06-10 NOTE — Progress Notes (Signed)
Occupational Therapy Treatment Patient Details Name: Joshua [name] MRN: [id number] DOB: 1935/12/25 Today's Date: 06/10/2022   History of present illness 86 y.o. M admitted on 06/03/22 due to AMS and fall. Pt undergoing work up for severe Sepsis. PMH inlcudes CAD, severe AS, HTN, HLD, arthritis, GERD, cataracts.   OT comments  Pt demonstrated increased activity tolerance and balance this session, as well as improved sequencing, recall, and attention. Overall pt is at a min guard level this session for safety. Continuing to recommend SNF to maximize pt's safety and independence. OT will follow acutely.    Recommendations for follow up therapy are one component of a multi-disciplinary discharge planning process, led by the attending physician.  Recommendations may be updated based on patient status, additional functional criteria and insurance authorization.    Follow Up Recommendations  Skilled nursing-short term rehab (<3 hours/day)    Assistance Recommended at Discharge Frequent or constant Supervision/Assistance  Patient can return home with the following  A little help with walking and/or transfers;A lot of help with bathing/dressing/bathroom;Assistance with cooking/housework;Direct supervision/assist for medications management;Direct supervision/assist for financial management   Equipment Recommendations  None recommended by OT    Recommendations for Other Services      Precautions / Restrictions Precautions Precautions: Fall Restrictions Weight Bearing Restrictions: No       Mobility Bed Mobility               General bed mobility comments: Up in recliner    Transfers Overall transfer level: Needs assistance Equipment used: Rolling walker (2 wheels) Transfers: Sit to/from Stand Sit to Stand: Supervision, Min guard           General transfer comment: Increased time to power up min guard for steadying     Balance Overall balance assessment: Needs  assistance Sitting-balance support: No upper extremity supported, Feet supported Sitting balance-Leahy Scale: Good     Standing balance support: Bilateral upper extremity supported, Reliant on assistive device for balance Standing balance-Leahy Scale: Poor                             ADL either performed or assessed with clinical judgement   ADL Overall ADL's : Needs assistance/impaired     Grooming: Supervision/safety;Standing Grooming Details (indicate cue type and reason): at sink                 Toilet Transfer: Min guard;Ambulation Toilet Transfer Details (indicate cue type and reason): No physical assist needed Toileting- Clothing Manipulation and Hygiene: Min guard;Sitting/lateral lean;Sit to/from stand Toileting - Water quality scientist Details (indicate cue type and reason): min guard for safety with leaning outside [place] of Support     Functional mobility during ADLs: Min guard;Rolling walker (2 wheels) General ADL Comments: Min guard overall for ADL's this session    Extremity/Trunk Assessment Upper Extremity Assessment Upper Extremity Assessment: Generalized weakness   Lower Extremity Assessment Lower Extremity Assessment: Defer to PT evaluation        Vision       Perception     Praxis      Cognition Arousal/Alertness: Awake/alert Behavior During Therapy: WFL for tasks assessed/performed Overall Cognitive Status: Impaired/Different from baseline Area of Impairment: Memory, Safety/judgement, Problem solving                     Memory: Decreased short-term memory, Decreased recall of precautions       Problem Solving: Slow processing, Requires verbal  cues General Comments: Improved recall and attention to directions this session, continues to have slowed processing        Exercises      Shoulder Instructions       General Comments VSS on RA    Pertinent Vitals/ Pain       Pain Assessment Pain Assessment:  No/denies pain  Home Living                                          Prior Functioning/Environment              Frequency  Min 2X/week        Progress Toward Goals  OT Goals(current goals can now be found in the care plan section)  Progress towards OT goals: Progressing toward goals  Acute Rehab OT Goals Patient Stated Goal: To get stronger and get home OT Goal Formulation: With patient Time For Goal Achievement: 06/20/22 Potential to Achieve Goals: Good ADL Goals Pt/caregiver will Perform Home Exercise Program: Increased strength;Both right and left upper extremity;With theraband;With written HEP provided Additional ADL Goal #1: Pt will follow 3-4 multistep task in room to simulate basic cooking task at home with minimal cuing/compensatory techniques. Additional ADL Goal #2: Pt will complete medication managment task with min A/compensatory strategies.  Plan Discharge plan remains appropriate;Frequency remains appropriate    Co-evaluation                 AM-PAC OT "6 Clicks" Daily Activity     Outcome Measure   Help from another person eating meals?: A Little Help from another person taking care of personal grooming?: A Little Help from another person toileting, which includes using toliet, bedpan, or urinal?: A Little Help from another person bathing (including washing, rinsing, drying)?: A Little Help from another person to put on and taking off regular upper body clothing?: A Little Help from another person to put on and taking off regular lower body clothing?: A Little 6 Click Score: 18    End of Session Equipment Utilized During Treatment: Gait belt;Rolling walker (2 wheels)  OT Visit Diagnosis: Unsteadiness on feet (R26.81);Other abnormalities of gait and mobility (R26.89);Muscle weakness (generalized) (M62.81);Other symptoms and signs involving cognitive function   Activity Tolerance Patient tolerated treatment well   Patient  Left in chair;with call bell/phone within reach;with chair alarm set   Nurse Communication Mobility status        Time: 0881-1031 OT Time Calculation (min): 12 min  Charges: OT General Charges $OT Visit: 1 Visit OT Treatments $Therapeutic Activity: 8-22 mins  Paulita Fujita, OTR/L  Chantale Leugers Elane Yolanda Bonine 06/10/2022, 2:18 PM

## 2022-06-10 NOTE — TOC Transition Note (Signed)
Transition of Care [facility]) - CM/SW Discharge Note   Patient Details  Name: EARNEST MCGILLIS MRN: [id number] Date of Birth: 10/18/1936  Transition of Care [facility]) CM/SW Contact:  Tresa Endo Phone Number: 06/10/2022, 11:50 AM   Clinical Narrative:    Patient will DC to: [facility] Anticipated DC date: 06/10/2022 Family notified: Pt Son Transport by: Pt Son   Per MD patient ready for DC to [facility]. RN to call report prior to discharge [phone]). RN, patient, patient's family, and facility notified of DC. Discharge Summary and FL2 sent to facility. DC packet on chart.    CSW will sign off for now as social work intervention is no longer needed. Please consult [place] again if new needs arise.       Barriers to Discharge: Continued Medical Work up, Ship broker   Patient Goals and CMS Choice Patient states their goals for this hospitalization and ongoing recovery are:: to go home CMS Medicare.gov Compare Post Acute Care list provided to:: Patient Represenative (must comment) Choice offered to / list presented to : Adult Children  Discharge Placement                       Discharge Plan and Services     Post Acute Care Choice: Skilled Nursing Facility                               Social Determinants of Health (SDOH) Interventions     Readmission Risk Interventions     No data to display

## 2022-06-10 NOTE — Discharge Summary (Signed)
Physician Discharge Summary  Joshua [name] JYN:[id number] DOB: [birth year] DOA: 06/03/2022  PCP: Shelda Pal, DO  Admit date: 06/03/2022 Discharge date: 06/10/2022  Admitted From: Home Disposition: SNF  Recommendations for Outpatient Follow-up:  Follow up with PCP in 1-2 weeks Please obtain BMP/CBC in one week  Discharge Condition: Stable CODE STATUS: Full Diet recommendation: Low-salt low-fat diet  Brief/Interim Summary: Joshua [name] is a 86 y.o. male with a history of hypertension, hyperlipidemia, GERD, arthritis, severe aortic stenosis s/p AVR, CAD s/p CABG, complete heart block s/p pacer. Patient presented secondary to being found down on the floor and altered. Patient found to have evidence of acute metabolic encephalopathy, rhabdomyolysis and sepsis presumed secondary to a UTI. Patient was started on IV fluids and IV antibiotics with improvement of mentation. Stable for discharge to SNF on 8/21.  Discharge Diagnoses:   * Severe sepsis secondary to UTI (HCC)-resolved as of 06/07/2022 - completed antibiotic course   Traumatic rhabdomyolysis (HCC)-resolved as of 8/65/7846   Acute metabolic encephalopathy-resolved as of 06/07/2022 Concurrent hospital delirium, resolved 06/07/22 Secondary to above   Normocytic anemia Of chronic disease   CAD (coronary artery disease) Resume statin at DC   CHB (complete heart block) ([place]) S/p pacemaker. Noted.   Essential hypertension Continue metoprolol and lisinopril  Discharge Instructions   Allergies as of 06/10/2022   No Known Allergies      Medication List     STOP taking these medications    amoxicillin 500 MG tablet Commonly known as: AMOXIL       TAKE these medications    aspirin EC 81 MG tablet Take 1 tablet (81 mg total) by mouth daily. Swallow whole.   atorvastatin 40 MG tablet Commonly known as: LIPITOR Take 1 tablet (40 mg total) by mouth daily.   cyanocobalamin 1000 MCG tablet Commonly  known as: VITAMIN B12 Take 1,000 mcg by mouth daily.   GLUCOSAMINE PO Take 3,000 mg by mouth every other day.   lisinopril 20 MG tablet Commonly known as: ZESTRIL TAKE 1 TABLET EVERY DAY   melatonin 5 MG Tabs Take 1 tablet (5 mg total) by mouth at bedtime as needed.   metoprolol tartrate 25 MG tablet Commonly known as: LOPRESSOR TAKE 1/2 TABLET TWICE DAILY, HOLD FOR HEART RATE LESS THAN 60, SYSTOLIC BLOOD PRESSURE LESS THAN OR EQUAL TO 105 What changed: See the new instructions.   multivitamin tablet Take 1 tablet by mouth daily.   omeprazole 40 MG capsule Commonly known as: PRILOSEC TAKE 1 CAPSULE EVERY DAY   Pfizer COVID-19 Vac Bivalent injection Generic drug: COVID-19 mRNA bivalent vaccine (Pfizer) Inject into the muscle.        No Known Allergies  Procedures/Studies: DG Pelvis Portable  Result Date: 06/04/2022 CLINICAL DATA:  Fall. EXAM: PORTABLE PELVIS 1-2 VIEWS COMPARISON:  None Available. FINDINGS: There is no evidence of pelvic fracture or diastasis. No pelvic bone lesions are seen. Heavy vascular calcifications are noted in the pelvis and lower extremities bilaterally. Degenerative changes are noted in the lower lumbar spine. IMPRESSION: No acute fracture or dislocation. Electronically Signed   By: Brett Fairy M.D.   On: 06/04/2022 04:52   CT CERVICAL SPINE WO CONTRAST  Result Date: 06/03/2022 CLINICAL DATA:  Trauma. EXAM: CT CERVICAL SPINE WITHOUT CONTRAST TECHNIQUE: Multidetector CT imaging of the cervical spine was performed without intravenous contrast. Multiplanar CT image reconstructions were also generated. RADIATION DOSE REDUCTION: This exam was performed according to the departmental dose-optimization program which includes automated exposure control,  adjustment of the mA and/or kV according to patient size and/or use of iterative reconstruction technique. COMPARISON:  None Available. FINDINGS: Alignment: There is trace anterolisthesis at C3-C4 which is  favored is degenerative. Alignment is otherwise anatomic. Skull base and vertebrae: Bones are osteopenic. No acute fracture. No primary bone lesion or focal pathologic process. Soft tissues and spinal canal: No prevertebral fluid or swelling. No visible canal hematoma. Disc levels: There is disc space narrowing which is severe at C5-C6, C6-C7 and C7-T1. Scattered bilateral facet arthropathy is present causing multilevel mild neural foraminal stenosis. There is moderate bilateral neural foraminal stenosis at C3-C4 secondary to facet arthropathy and uncovertebral spurring. There is mild central canal stenosis at C6-C7 secondary to posterior disc osteophyte complex. No severe central canal stenosis identified at any level. Upper chest: Negative. Other: None. IMPRESSION: 1. No acute fracture or traumatic subluxation of the cervical spine. 2.  Moderate degenerative changes of the cervical spine. Electronically Signed   By: Ronney Asters M.D.   On: 06/03/2022 22:55   CT HEAD WO CONTRAST  Result Date: 06/03/2022 CLINICAL DATA:  Mental status change, fall yesterday EXAM: CT HEAD WITHOUT CONTRAST TECHNIQUE: Contiguous axial images were obtained from the base of the skull through the vertex without intravenous contrast. RADIATION DOSE REDUCTION: This exam was performed according to the departmental dose-optimization program which includes automated exposure control, adjustment of the mA and/or kV according to patient size and/or use of iterative reconstruction technique. COMPARISON:  None Available. FINDINGS: Brain: No intracranial hemorrhage, mass effect, or evidence of acute infarct. No hydrocephalus. No extra-axial fluid collection. Generalized cerebral atrophy. Ill-defined hypoattenuation within the cerebral white matter is nonspecific but consistent with chronic small vessel ischemic disease. Vascular: No hyperdense vessel. Calcification of the intracranial internal carotid arteries. Skull: No fracture or focal  lesion. Sinuses/Orbits: No acute finding. Mild mucosal thickening in the maxillary sinuses. Other: None. IMPRESSION: No acute intracranial abnormality. Generalized cerebral atrophy and chronic small-vessel ischemic disease. Electronically Signed   By: Placido Sou M.D.   On: 06/03/2022 22:47   DG Chest Portable 1 View  Result Date: 06/03/2022 CLINICAL DATA:  Altered mental status. EXAM: PORTABLE CHEST 1 VIEW COMPARISON:  December 25, 2021 FINDINGS: There is a dual lead AICD. Multiple sternal wires are seen. The heart size and mediastinal contours are within normal limits. An artificial cardiac valve is seen. Both lungs are clear. The visualized skeletal structures are unremarkable. IMPRESSION: 1. Evidence of prior median sternotomy. 2. No acute or active cardiopulmonary disease. Electronically Signed   By: Virgina Norfolk M.D.   On: 06/03/2022 21:33   CUP PACEART REMOTE DEVICE CHECK  Result Date: 05/19/2022 Scheduled remote reviewed. Normal device function.  1 NSVT, 18 beats Next remote 91 days- JJB    Subjective: No acute issues or events overnight denies nausea vomiting diarrhea constipation headache fevers chills or chest pain   Discharge Exam: Vitals:   06/10/22 0441 06/10/22 0737  BP: (!) 140/69 134/63  Pulse: (!) 59 62  Resp: 18 16  Temp: 98 F (36.7 C) 98.3 F (36.8 C)  SpO2: 99% 100%   Vitals:   06/09/22 2008 06/10/22 0441 06/10/22 0500 06/10/22 0737  BP: (!) 148/67 (!) 140/69  134/63  Pulse: 61 (!) 59  62  Resp: '18 18  16  '$ Temp: 98.2 F (36.8 C) 98 F (36.7 C)  98.3 F (36.8 C)  TempSrc: Oral Oral  Oral  SpO2: 100% 99%  100%  Weight:   80.9 kg  Height:        General:  Pleasantly resting in bed, No acute distress. HEENT:  Normocephalic atraumatic.  Sclerae nonicteric, noninjected.  Extraocular movements intact bilaterally. Neck:  Without mass or deformity.  Trachea is midline. Lungs:  Clear to auscultate bilaterally without rhonchi, wheeze, or rales. Heart:   Regular rate and rhythm.  Without murmurs, rubs, or gallops. Abdomen:  Soft, nontender, nondistended.  Without guarding or rebound. Extremities: Without cyanosis, clubbing, edema, or obvious deformity. Vascular:  Dorsalis pedis and posterior tibial pulses palpable bilaterally. Skin:  Warm and dry, no erythema, no ulcerations.   The results of significant diagnostics from this hospitalization (including imaging, microbiology, ancillary and laboratory) are listed below for reference.     Microbiology: Recent Results (from the past 240 hour(s))  Culture, blood (routine x 2)     Status: None   Collection Time: 06/04/22 12:22 AM   Specimen: BLOOD  Result Value Ref Range Status   Specimen Description BLOOD RIGHT ANTECUBITAL  Final   Special Requests   Final    BOTTLES DRAWN AEROBIC AND ANAEROBIC Blood Culture adequate volume   Culture   Final    NO GROWTH 5 DAYS Performed at [facility], [address], [place], [place] [address]    Report Status 06/09/2022 FINAL  Final  Culture, blood (routine x 2)     Status: None   Collection Time: 06/04/22 12:27 AM   Specimen: BLOOD RIGHT HAND  Result Value Ref Range Status   Specimen Description BLOOD RIGHT HAND  Final   Special Requests   Final    BOTTLES DRAWN AEROBIC AND ANAEROBIC Blood Culture adequate volume   Culture   Final    NO GROWTH 5 DAYS Performed at [facility], [place] [address], [place], [place] [address]    Report Status 06/09/2022 FINAL  Final  SARS Coronavirus 2 by RT PCR (hospital order, performed in [facility] hospital lab) *cepheid single result test* Anterior Nasal Swab     Status: None   Collection Time: 06/04/22  5:20 AM   Specimen: Anterior Nasal Swab  Result Value Ref Range Status   SARS Coronavirus 2 by RT PCR NEGATIVE NEGATIVE Final    Comment: (NOTE) SARS-CoV-2 target nucleic acids are NOT DETECTED.  The SARS-CoV-2 RNA is generally detectable in upper and lower respiratory specimens during the  acute phase of infection. The lowest concentration of SARS-CoV-2 viral copies this assay can detect is 250 copies / mL. A negative result does not preclude SARS-CoV-2 infection and should not be used as the sole basis for treatment or other patient management decisions.  A negative result may occur with improper specimen collection / handling, submission of specimen other than nasopharyngeal swab, presence of viral mutation(s) within the areas targeted by this assay, and inadequate number of viral copies (<250 copies / mL). A negative result must be combined with clinical observations, patient history, and epidemiological information.  Fact Sheet for Patients:   https://www.patel.info/  Fact Sheet for Healthcare Providers: https://hall.com/  This test is not yet approved or  cleared by the [place] FDA and has been authorized for detection and/or diagnosis of SARS-CoV-2 by FDA under an Emergency Use Authorization (EUA).  This EUA will remain in effect (meaning this test can be used) for the duration of the COVID-19 declaration under Section 564(b)(1) of the Act, 21 U.S.C. section 360bbb-3(b)(1), unless the authorization is terminated or revoked sooner.  Performed at [facility], [place]  [address] [place], [place] [address]      Labs: BNP (last 3 results) Recent Labs    09/25/21 1133  BNP 14.4   Basic Metabolic Panel: Recent Labs  Lab 06/04/22 0250 06/05/22 0124 06/06/22 0058 06/07/22 0141 06/08/22 0149  NA 138 135 137 138 138  K 3.8 3.4* 3.7 3.3* 3.7  CL 104 104 107 105 106  CO2 '26 22 23 25 28  '$ GLUCOSE 119* 91 114* 114* 106*  BUN '15 14 13 11 11  '$ CREATININE 1.01 1.10 1.02 0.95 0.84  CALCIUM 8.7* 8.1* 8.5* 8.6* 8.6*  MG  --  1.4* 1.9 1.7 1.9   Liver Function Tests: Recent Labs  Lab 06/04/22 0250 06/05/22 0124 06/06/22 0058 06/07/22 0141 06/08/22 0149  AST 59* 86* 76* 50* 38  ALT '23 30 29 28 23  '$ ALKPHOS  44 44 45 48 44  BILITOT 1.9* 2.9* 1.8* 1.5* 0.9  PROT 5.9* 5.4* 5.8* 5.8* 5.8*  ALBUMIN 3.3* 3.0* 3.1* 3.1* 2.9*   No results for input(s): "LIPASE", "AMYLASE" in the last 168 hours. Recent Labs  Lab 06/03/22 2056  AMMONIA 15   CBC: Recent Labs  Lab 06/04/22 0250 06/05/22 0124 06/06/22 0058 06/07/22 0141 06/08/22 0149  WBC 7.9 8.6 7.7 6.0 5.8  NEUTROABS 5.9 6.6 5.4 3.6 3.1  HGB 10.5* 10.3* 10.8* 11.0* 10.5*  HCT 29.5* 29.4* 30.1* 31.1* 29.7*  MCV 98.7 100.3* 97.7 99.0 98.3  PLT 103* 101* 102* 115* 142*   Cardiac Enzymes: Recent Labs  Lab 06/03/22 2324 06/05/22 0124 06/06/22 0058 06/07/22 0141 06/08/22 0149  CKTOTAL 1,439* 2,533* 1,865* 831* 359   CBG: Recent Labs  Lab 06/03/22 2145  GLUCAP 139*    Urinalysis    Component Value Date/Time   COLORURINE YELLOW 06/03/2022 2252   APPEARANCEUR HAZY (A) 06/03/2022 2252   LABSPEC 1.018 06/03/2022 2252   PHURINE 5.0 06/03/2022 2252   GLUCOSEU NEGATIVE 06/03/2022 2252   HGBUR SMALL (A) 06/03/2022 2252   BILIRUBINUR NEGATIVE 06/03/2022 2252   KETONESUR 20 (A) 06/03/2022 2252   PROTEINUR 30 (A) 06/03/2022 2252   NITRITE NEGATIVE 06/03/2022 2252   LEUKOCYTESUR SMALL (A) 06/03/2022 2252   Sepsis Labs Recent Labs  Lab 06/05/22 0124 06/06/22 0058 06/07/22 0141 06/08/22 0149  WBC 8.6 7.7 6.0 5.8   Microbiology Recent Results (from the past 240 hour(s))  Culture, blood (routine x 2)     Status: None   Collection Time: 06/04/22 12:22 AM   Specimen: BLOOD  Result Value Ref Range Status   Specimen Description BLOOD RIGHT ANTECUBITAL  Final   Special Requests   Final    BOTTLES DRAWN AEROBIC AND ANAEROBIC Blood Culture adequate volume   Culture   Final    NO GROWTH 5 DAYS Performed at [facility], [address], [place], [place] [address]    Report Status 06/09/2022 FINAL  Final  Culture, blood (routine x 2)     Status: None   Collection Time: 06/04/22 12:27 AM   Specimen: BLOOD RIGHT HAND  Result  Value Ref Range Status   Specimen Description BLOOD RIGHT HAND  Final   Special Requests   Final    BOTTLES DRAWN AEROBIC AND ANAEROBIC Blood Culture adequate volume   Culture   Final    NO GROWTH 5 DAYS Performed at [facility], [place] [address] [place], [place] [address]    Report Status 06/09/2022 FINAL  Final  SARS Coronavirus 2 by RT PCR (hospital order, performed in  [facility] hospital lab) *cepheid single result test* Anterior Nasal Swab     Status: None   Collection Time: 06/04/22  5:20 AM   Specimen: Anterior Nasal Swab  Result Value Ref Range Status   SARS Coronavirus 2 by RT PCR NEGATIVE NEGATIVE Final    Comment: (NOTE) SARS-CoV-2 target nucleic acids are NOT DETECTED.  The SARS-CoV-2 RNA is generally detectable in upper and lower respiratory specimens during the acute phase of infection. The lowest concentration of SARS-CoV-2 viral copies this assay can detect is 250 copies / mL. A negative result does not preclude SARS-CoV-2 infection and should not be used as the sole basis for treatment or other patient management decisions.  A negative result may occur with improper specimen collection / handling, submission of specimen other than nasopharyngeal swab, presence of viral mutation(s) within the areas targeted by this assay, and inadequate number of viral copies (<250 copies / mL). A negative result must be combined with clinical observations, patient history, and epidemiological information.  Fact Sheet for Patients:   https://www.patel.info/  Fact Sheet for Healthcare Providers: https://hall.com/  This test is not yet approved or  cleared by the [place] FDA and has been authorized for detection and/or diagnosis of SARS-CoV-2 by FDA under an Emergency Use Authorization (EUA).  This EUA will remain in effect (meaning this test can be used) for the duration of the COVID-19 declaration under Section 564(b)(1) of  the Act, 21 U.S.C. section 360bbb-3(b)(1), unless the authorization is terminated or revoked sooner.  Performed at [facility], [place] [address] [place], [place] [address]      Time coordinating discharge: Over 30 minutes  SIGNED:   Little Ishikawa, DO [facility] 06/10/2022, 10:54 AM Pager   If 7PM-7AM, please contact night-coverage [email or web address]

## 2022-06-11 DIAGNOSIS — I455 Other specified heart block: Secondary | ICD-10-CM | POA: Diagnosis not present

## 2022-06-11 DIAGNOSIS — I350 Nonrheumatic aortic (valve) stenosis: Secondary | ICD-10-CM | POA: Diagnosis not present

## 2022-06-11 DIAGNOSIS — E785 Hyperlipidemia, unspecified: Secondary | ICD-10-CM | POA: Diagnosis not present

## 2022-06-11 DIAGNOSIS — Z953 Presence of xenogenic heart valve: Secondary | ICD-10-CM | POA: Diagnosis not present

## 2022-06-11 DIAGNOSIS — Z950 Presence of cardiac pacemaker: Secondary | ICD-10-CM | POA: Diagnosis not present

## 2022-06-11 DIAGNOSIS — A419 Sepsis, unspecified organism: Secondary | ICD-10-CM | POA: Diagnosis not present

## 2022-06-11 DIAGNOSIS — I10 Essential (primary) hypertension: Secondary | ICD-10-CM | POA: Diagnosis not present

## 2022-06-12 DIAGNOSIS — M6281 Muscle weakness (generalized): Secondary | ICD-10-CM | POA: Diagnosis not present

## 2022-06-12 DIAGNOSIS — I455 Other specified heart block: Secondary | ICD-10-CM | POA: Diagnosis not present

## 2022-06-12 DIAGNOSIS — I10 Essential (primary) hypertension: Secondary | ICD-10-CM | POA: Diagnosis not present

## 2022-06-12 DIAGNOSIS — A419 Sepsis, unspecified organism: Secondary | ICD-10-CM | POA: Diagnosis not present

## 2022-06-15 DIAGNOSIS — E785 Hyperlipidemia, unspecified: Secondary | ICD-10-CM | POA: Diagnosis not present

## 2022-06-15 DIAGNOSIS — I10 Essential (primary) hypertension: Secondary | ICD-10-CM | POA: Diagnosis not present

## 2022-06-15 DIAGNOSIS — I455 Other specified heart block: Secondary | ICD-10-CM | POA: Diagnosis not present

## 2022-06-15 DIAGNOSIS — I350 Nonrheumatic aortic (valve) stenosis: Secondary | ICD-10-CM | POA: Diagnosis not present

## 2022-06-15 DIAGNOSIS — Z950 Presence of cardiac pacemaker: Secondary | ICD-10-CM | POA: Diagnosis not present

## 2022-06-15 DIAGNOSIS — Z953 Presence of xenogenic heart valve: Secondary | ICD-10-CM | POA: Diagnosis not present

## 2022-06-15 DIAGNOSIS — A419 Sepsis, unspecified organism: Secondary | ICD-10-CM | POA: Diagnosis not present

## 2022-06-16 DIAGNOSIS — I455 Other specified heart block: Secondary | ICD-10-CM | POA: Diagnosis not present

## 2022-06-16 DIAGNOSIS — A419 Sepsis, unspecified organism: Secondary | ICD-10-CM | POA: Diagnosis not present

## 2022-06-16 DIAGNOSIS — M6281 Muscle weakness (generalized): Secondary | ICD-10-CM | POA: Diagnosis not present

## 2022-06-16 DIAGNOSIS — I10 Essential (primary) hypertension: Secondary | ICD-10-CM | POA: Diagnosis not present

## 2022-06-17 DIAGNOSIS — M6281 Muscle weakness (generalized): Secondary | ICD-10-CM | POA: Diagnosis not present

## 2022-06-17 DIAGNOSIS — A419 Sepsis, unspecified organism: Secondary | ICD-10-CM | POA: Diagnosis not present

## 2022-06-17 DIAGNOSIS — E785 Hyperlipidemia, unspecified: Secondary | ICD-10-CM | POA: Diagnosis not present

## 2022-06-17 DIAGNOSIS — I10 Essential (primary) hypertension: Secondary | ICD-10-CM | POA: Diagnosis not present

## 2022-06-17 NOTE — Progress Notes (Signed)
Remote pacemaker transmission.   

## 2022-06-18 DIAGNOSIS — I455 Other specified heart block: Secondary | ICD-10-CM | POA: Diagnosis not present

## 2022-06-18 DIAGNOSIS — E785 Hyperlipidemia, unspecified: Secondary | ICD-10-CM | POA: Diagnosis not present

## 2022-06-18 DIAGNOSIS — Z953 Presence of xenogenic heart valve: Secondary | ICD-10-CM | POA: Diagnosis not present

## 2022-06-18 DIAGNOSIS — I10 Essential (primary) hypertension: Secondary | ICD-10-CM | POA: Diagnosis not present

## 2022-06-18 DIAGNOSIS — Z950 Presence of cardiac pacemaker: Secondary | ICD-10-CM | POA: Diagnosis not present

## 2022-06-18 DIAGNOSIS — A419 Sepsis, unspecified organism: Secondary | ICD-10-CM | POA: Diagnosis not present

## 2022-06-18 DIAGNOSIS — I350 Nonrheumatic aortic (valve) stenosis: Secondary | ICD-10-CM | POA: Diagnosis not present

## 2022-06-20 ENCOUNTER — Emergency Department (HOSPITAL_BASED_OUTPATIENT_CLINIC_OR_DEPARTMENT_OTHER)
Admission: EM | Admit: 2022-06-20 | Discharge: 2022-06-20 | Disposition: A | Discharge status: Home / Self Care | Payer: Medicare HMO | Attending: Emergency Medicine | Admitting: Emergency Medicine

## 2022-06-20 ENCOUNTER — Other Ambulatory Visit: Payer: Self-pay

## 2022-06-20 ENCOUNTER — Encounter (HOSPITAL_BASED_OUTPATIENT_CLINIC_OR_DEPARTMENT_OTHER): Payer: Self-pay | Admitting: Emergency Medicine

## 2022-06-20 ENCOUNTER — Emergency Department (HOSPITAL_BASED_OUTPATIENT_CLINIC_OR_DEPARTMENT_OTHER): Payer: Medicare HMO

## 2022-06-20 DIAGNOSIS — S51012D Laceration without foreign body of left elbow, subsequent encounter: Secondary | ICD-10-CM | POA: Diagnosis not present

## 2022-06-20 DIAGNOSIS — H2511 Age-related nuclear cataract, right eye: Secondary | ICD-10-CM | POA: Diagnosis not present

## 2022-06-20 DIAGNOSIS — Z602 Problems related to living alone: Secondary | ICD-10-CM | POA: Diagnosis not present

## 2022-06-20 DIAGNOSIS — R8290 Unspecified abnormal findings in urine: Secondary | ICD-10-CM | POA: Diagnosis not present

## 2022-06-20 DIAGNOSIS — Z87440 Personal history of urinary (tract) infections: Secondary | ICD-10-CM | POA: Diagnosis not present

## 2022-06-20 DIAGNOSIS — R9082 White matter disease, unspecified: Secondary | ICD-10-CM | POA: Diagnosis not present

## 2022-06-20 DIAGNOSIS — R519 Headache, unspecified: Secondary | ICD-10-CM | POA: Diagnosis not present

## 2022-06-20 DIAGNOSIS — N390 Urinary tract infection, site not specified: Secondary | ICD-10-CM | POA: Diagnosis not present

## 2022-06-20 DIAGNOSIS — I2510 Atherosclerotic heart disease of native coronary artery without angina pectoris: Secondary | ICD-10-CM | POA: Diagnosis not present

## 2022-06-20 DIAGNOSIS — S0990XA Unspecified injury of head, initial encounter: Secondary | ICD-10-CM | POA: Diagnosis not present

## 2022-06-20 DIAGNOSIS — S0101XA Laceration without foreign body of scalp, initial encounter: Secondary | ICD-10-CM | POA: Diagnosis not present

## 2022-06-20 DIAGNOSIS — W19XXXA Unspecified fall, initial encounter: Secondary | ICD-10-CM

## 2022-06-20 DIAGNOSIS — I442 Atrioventricular block, complete: Secondary | ICD-10-CM | POA: Diagnosis not present

## 2022-06-20 DIAGNOSIS — M5030 Other cervical disc degeneration, unspecified cervical region: Secondary | ICD-10-CM | POA: Diagnosis not present

## 2022-06-20 DIAGNOSIS — I10 Essential (primary) hypertension: Secondary | ICD-10-CM | POA: Insufficient documentation

## 2022-06-20 DIAGNOSIS — I5042 Chronic combined systolic (congestive) and diastolic (congestive) heart failure: Secondary | ICD-10-CM | POA: Diagnosis not present

## 2022-06-20 DIAGNOSIS — I110 Hypertensive heart disease with heart failure: Secondary | ICD-10-CM | POA: Diagnosis not present

## 2022-06-20 DIAGNOSIS — S51011D Laceration without foreign body of right elbow, subsequent encounter: Secondary | ICD-10-CM | POA: Diagnosis not present

## 2022-06-20 DIAGNOSIS — Z043 Encounter for examination and observation following other accident: Secondary | ICD-10-CM | POA: Diagnosis not present

## 2022-06-20 DIAGNOSIS — M1990 Unspecified osteoarthritis, unspecified site: Secondary | ICD-10-CM | POA: Diagnosis not present

## 2022-06-20 LAB — URINALYSIS, ROUTINE W REFLEX MICROSCOPIC
Bilirubin Urine: NEGATIVE
Glucose, UA: NEGATIVE mg/dL
Hgb urine dipstick: NEGATIVE
Ketones, ur: NEGATIVE mg/dL
Leukocytes,Ua: NEGATIVE
Nitrite: NEGATIVE
Protein, ur: NEGATIVE mg/dL
Specific Gravity, Urine: 1.02 (ref 1.005–1.030)
pH: 5 (ref 5.0–8.0)

## 2022-06-20 NOTE — ED Triage Notes (Signed)
Pt arrives pov, to triage in wheelchair, reports fall last night. Pt denies loc, dizziness or n/v. Pt c/o lower back pain. Denie neck pain. Lac noted to posterior head, endorses skin tears bilaterally to upper extremities. Bandages applied pta. Denies thinners. Bruising noted to LUE, RUE

## 2022-06-20 NOTE — ED Provider Notes (Signed)
Emergency Department Provider Note   I have reviewed the triage vital signs and the nursing notes.   HISTORY  Chief Complaint Fall   HPI Joshua [name] is a 86 y.o. male with PMH reviewed below presents to the emergency department for evaluation after fall yesterday evening.  Patient lives alone with close family support nearby.  He states he got up in the night to go to the bathroom and upon returning lost his balance falling backwards striking his head.  He states he was on the ground for several hours but this morning was able to get to his feet, with some difficulty, and begin to clean up some of the blood after the fall.  He sustained a laceration to the back of his head as well as skin tears to both elbows.  His home care nurse arrived to find him with the wounds and contacted family.  They bring him to the ED for further evaluation.  Patient describes some mild posterior headache but otherwise states he is feeling well.  Family note that he was just discharged from rehab facility after hospitalization when he had a fall previously, related to a UTI.  He is not had any dysuria, hesitancy, urgency.  Family has not noticed any mental status change.  They are actively working with the primary care physician and the patient regarding independent living placement.   Past Medical History:  Diagnosis Date   Arthritis    Cataract    Esophageal stricture    GERD (gastroesophageal reflux disease)    Hyperlipidemia    Hypertension    Liver disease    hepatitis   Normocytic anemia 06/04/2022   Rhabdomyolysis 06/04/2022   Severe aortic stenosis     Review of Systems  Constitutional: No fever/chills Eyes: No visual changes. ENT: No sore throat. Cardiovascular: Denies chest pain. Respiratory: Denies shortness of breath. Gastrointestinal: No abdominal pain.  No nausea, no vomiting.  No diarrhea.  No constipation. Genitourinary: Negative for dysuria. Musculoskeletal: Negative for back  pain. Skin: Laceration to the posterior scalp and elbows.  Neurological: Negative for headaches, focal weakness or numbness.  ____________________________________________   PHYSICAL EXAM:  VITAL SIGNS: ED Triage Vitals  Enc Vitals Group     BP 06/20/22 1229 116/61     Pulse Rate 06/20/22 1229 68     Resp 06/20/22 1229 18     Temp 06/20/22 1229 98 F (36.7 C)     Temp Source 06/20/22 1229 Oral     SpO2 06/20/22 1229 99 %     Weight 06/20/22 1228 174 lb (78.9 kg)     Height 06/20/22 1228 5' 11.5" (1.816 m)   Constitutional: Alert and oriented. Well appearing and in no acute distress. Eyes: Conjunctivae are normal. Head: Dried approximately 3 cm laceration to the occipital scalp minimal surrounding hematoma.  Nose: No congestion/rhinnorhea. Mouth/Throat: Mucous membranes are moist.  Neck: No stridor. No cervical spine tenderness to palpation. Cardiovascular: Normal rate, regular rhythm. Good peripheral circulation. Grossly normal heart sounds.   Respiratory: Normal respiratory effort.  No retractions. Lungs CTAB. Gastrointestinal: Soft and nontender. No distention.  Musculoskeletal: No lower extremity tenderness nor edema. No gross deformities of extremities. Neurologic:  Normal speech and language. No gross focal neurologic deficits are appreciated.  Skin:  Skin is warm and dry.  Skin tears to the bilateral elbows which were cleaned and dressed.  ____________________________________________   LABS (all labs ordered are listed, but only abnormal results are displayed)  Labs Reviewed  URINE CULTURE  URINALYSIS, ROUTINE W REFLEX MICROSCOPIC   ____________________________________________  RADIOLOGY  No results found.  ____________________________________________   PROCEDURES  Procedure(s) performed:   Procedures   ____________________________________________   INITIAL IMPRESSION / ASSESSMENT AND PLAN / ED COURSE  Pertinent labs & imaging results that were  available during my care of the patient were reviewed by me and considered in my medical decision making (see chart for details).   This patient is Presenting for Evaluation of head injury, which does require a range of treatment options, and is a complaint that involves a high risk of morbidity and mortality.  The Differential Diagnoses Differential diagnoses for head trauma includes subdural hematoma, epidural hematoma, acute concussion, traumatic subarachnoid hemorrhage, cerebral contusions, etc.   Critical Interventions-    Medications - No data to display  Reassessment after intervention:     I did obtain Additional Historical Information from son at bedside.   I decided to review pertinent External Data, and in summary discharge on 8/23 with severe sepsis related to UTI and rhabdomyolysis.   Clinical Laboratory Tests Ordered, included ***  Radiologic Tests Ordered, included CT head and c-spine. I independently interpreted the images and agree with radiology interpretation.    Social Determinants of Health Risk patient is a non-smoker.   Consult complete with  Medical Decision Making: Summary:  Presents emergency department for evaluation after fall last night with head trauma.  It has been greater than 12 hours since the fall and the scalp laceration is well approximated.  Elbow skin tears were cleaned and dressed by me at the bedside.  Normal range of motion of the elbows.  No tenderness to the wrists or scaphoid.  CT imaging of the head without acute findings.   Reevaluation with update and discussion with   ***Considered admission***  Disposition:   ____________________________________________  FINAL CLINICAL IMPRESSION(S) / ED DIAGNOSES  Final diagnoses:  None     NEW OUTPATIENT MEDICATIONS STARTED DURING THIS VISIT:  New Prescriptions   No medications on file    Note:  This document was prepared using Dragon voice recognition software and may include  unintentional dictation errors.  Nanda Quinton, MD, [facility] Emergency Medicine

## 2022-06-20 NOTE — ED Notes (Signed)
ED Provider at bedside. 

## 2022-06-20 NOTE — Discharge Instructions (Signed)
You are seen today after a fall.  Your CT scan did not show any fractures or bleeding.  Please follow with your primary care physician and use your walker.  Change the dressings on your elbows every 24 hours with nonstick dressing for the next 2 to 3 days.  If you notice redness, swelling, drainage from any of your wounds you need to be reevaluated.  Since they occurred many hours ago we are not suturing your scalp laceration but if you notice sign of infection it should be evaluated for antibiotics.

## 2022-06-22 LAB — URINE CULTURE: Culture: NO GROWTH

## 2022-06-23 ENCOUNTER — Encounter: Payer: Self-pay | Admitting: Family Medicine

## 2022-06-23 ENCOUNTER — Ambulatory Visit (INDEPENDENT_AMBULATORY_CARE_PROVIDER_SITE_OTHER): Payer: Medicare HMO | Admitting: Family Medicine

## 2022-06-23 ENCOUNTER — Telehealth: Payer: Self-pay

## 2022-06-23 VITALS — BP 118/64 | HR 89 | Temp 98.3°F | Ht 71.5 in | Wt 174.5 lb

## 2022-06-23 DIAGNOSIS — W19XXXA Unspecified fall, initial encounter: Secondary | ICD-10-CM

## 2022-06-23 DIAGNOSIS — R531 Weakness: Secondary | ICD-10-CM | POA: Diagnosis not present

## 2022-06-23 NOTE — Progress Notes (Signed)
Chief Complaint  Patient presents with   Fall    Subjective: Patient is a 86 y.o. male here for a follow-up for fall. Here w son.   Few weeks ago, the patient spent a week in the hospital after UTI with associated delirium after being found down.  Most of his family was out of town but his son's wife was able to go over to check on him.  He was sent to subacute rehab after his hospitalization.  He got out on 9/2 and was urinating.  He turned around to get back to his walker and lost his balance and fell again.  He went to the emergency department where the work-up was normal.  He did not feel lightheaded/dizzy and denies any palpitations.  His family wants him to move into an independent living facility and they are meeting with him this afternoon.  The patient would like to know my opinion on this.  He has home physical therapy coming to work with him tomorrow.  He does feel weak.  He denies any balance issues.  Past Medical History:  Diagnosis Date   Arthritis    Cataract    Esophageal stricture    GERD (gastroesophageal reflux disease)    Hyperlipidemia    Hypertension    Liver disease    hepatitis   Normocytic anemia 06/04/2022   Rhabdomyolysis 06/04/2022   Severe aortic stenosis     Objective: BP 118/64   Pulse 89   Temp 98.3 F (36.8 C) (Oral)   Ht 5' 11.5" (1.816 m)   Wt 174 lb 8 oz (79.2 kg)   SpO2 96%   BMI 24.00 kg/m  General: Awake, appears stated age Heart: RRR, no LE edema or bruits Lungs: CTAB, no rales, wheezes or rhonchi. No accessory muscle use Neuro: Gait is cautious and slow.  Grip strength is adequate.  No cerebellar signs. Skin: Ecchymosis noted on both upper extremities Psych: Age appropriate judgment and insight, normal affect and mood  Assessment and Plan: Generalized weakness  Fall, initial encounter  Continue with home physical therapy.  They are interviewing an independent living facility today which I think is a reasonable idea.  Therapy is a  cornerstone from a medical standpoint. Follow-up as originally scheduled in January. The patient and his son voiced understanding and agreement to the plan.  I spent 35 minutes with the patient and his son discussing the above plan in addition to reviewing his chart on the same date of visit.  We did have an extended conversation regarding his independent living situation.  [place], DO 06/23/22  10:39 AM

## 2022-06-23 NOTE — Telephone Encounter (Signed)
Patient stated the fall was 2 weeks ago. Scheduled today at 10 AM

## 2022-06-23 NOTE — Patient Instructions (Addendum)
Work hard with physical therapy.  Foods that may reduce pain: 1) Ginger 2) Blueberries 3) Salmon 4) Pumpkin seeds 5) dark chocolate 6) turmeric 7) tart cherries 8) virgin olive oil 9) chilli peppers 10) mint 11) krill oil  Let us know if you need anything.

## 2022-06-23 NOTE — Telephone Encounter (Signed)
Who Is [place] / [place] Name [place] Name [place] Number [phone] Patient Name Joshua [name] Patient DOB 11-Apr-1936 Reason for Call Request to speak to Physician Initial Comment Cass w/ [facility], pt had a fall last night, needs to inform Dr. Also opening th pt for home care Additional Comment Pt has skin tears that need to be bandaged. Disp. Time Disposition Final User 06/20/2022 10:02:04 AM Send to [facility] Paging Queue [name] 06/20/2022 10:12:10 AM Called On-Call Provider [name] 06/20/2022 10:12:52 AM Page Completed Yes [name] [name] Phone DateTime Result/Outcome Message Type Notes [name] 06/20/2022 10:12:10 AM Called On Call Provider - Reached Doctor Paged [name] 06/20/2022 10:12:47 AM Spoke with On Call - General Message Result MD connected with [name] - with [place] Call Closed By: [name] Transaction Date/Time: 06/20/2022 9:58:25 AM (ET)

## 2022-06-25 ENCOUNTER — Telehealth: Payer: Self-pay | Admitting: Family Medicine

## 2022-06-25 NOTE — Telephone Encounter (Signed)
Caller/Agency:[facility]) Callback Number: [phone] Requesting OT/PT/Skilled Nursing/Social Work/Speech Therapy: Pt Frequency: 1x for 1w 2x for 4w  1x for 4w

## 2022-06-26 ENCOUNTER — Telehealth: Payer: Self-pay | Admitting: Family Medicine

## 2022-06-26 NOTE — Telephone Encounter (Signed)
Called HH informed ok per PCP for order.

## 2022-06-26 NOTE — Telephone Encounter (Signed)
Called HH to confirm ok per PCP for order.

## 2022-06-26 NOTE — Telephone Encounter (Signed)
Caller/Agency: Jeneen Rinks [facility]) Callback Number: [phone] Requesting OT/PT/Skilled Nursing/Social Work/Speech Therapy: OT Frequency: 1 w 4

## 2022-07-11 ENCOUNTER — Other Ambulatory Visit: Payer: Self-pay | Admitting: Family Medicine

## 2022-07-11 DIAGNOSIS — I10 Essential (primary) hypertension: Secondary | ICD-10-CM

## 2022-07-11 DIAGNOSIS — E785 Hyperlipidemia, unspecified: Secondary | ICD-10-CM

## 2022-07-28 ENCOUNTER — Ambulatory Visit (INDEPENDENT_AMBULATORY_CARE_PROVIDER_SITE_OTHER): Payer: Medicare HMO | Admitting: *Deleted

## 2022-07-28 ENCOUNTER — Telehealth: Payer: Self-pay | Admitting: Family Medicine

## 2022-07-28 DIAGNOSIS — Z0000 Encounter for general adult medical examination without abnormal findings: Secondary | ICD-10-CM

## 2022-07-28 NOTE — Progress Notes (Addendum)
Subjective:   Joshua [name] is a 86 y.o. male who presents for Medicare Annual/Subsequent preventive examination.  I connected with  Franco Collet on 07/28/22 by a audio enabled telemedicine application and verified that I am speaking with the correct person using two identifiers.  Patient Location: Home  Provider Location: Office/Clinic  I discussed the limitations of evaluation and management by telemedicine. The patient expressed understanding and agreed to proceed.   Review of Systems    Defer to PCP Cardiac Risk Factors include: advanced age (>30mn, >>31women);dyslipidemia;hypertension;male gender     Objective:    There were no vitals filed for this visit. There is no height or weight on file to calculate BMI.     07/28/2022    1:47 PM 06/20/2022   12:31 PM 06/09/2022    9:00 PM 12/15/2021   10:16 AM 12/04/2021    9:46 AM 11/28/2021   12:07 PM 11/05/2021    2:00 PM  Advanced Directives  Does Patient Have a Medical Advance Directive? Yes No No No Yes Yes Yes  Type of AParamedicof AGreenleafLiving will    HNadineLiving will [place]Living will [place] Does patient want to make changes to medical advance directive? No - Patient declined   No - Patient declined No - Patient declined No - Patient declined No - Patient declined  Copy of HSneadsin Chart? Yes - validated most recent copy scanned in chart (See row information)    Yes - validated most recent copy scanned in chart (See row information) Yes - validated most recent copy scanned in chart (See row information) No - copy requested    Current Medications (verified) Outpatient Encounter Medications as of 07/28/2022  Medication Sig   aspirin EC 81 MG tablet Take 1 tablet (81 mg total) by mouth daily. Swallow whole.   atorvastatin (LIPITOR) 40 MG tablet TAKE 1 TABLET EVERY DAY   COVID-19 mRNA bivalent  vaccine, Pfizer, (PFIZER COVID-19 VAC BIVALENT) injection Inject into the muscle.   Glucosamine HCl (GLUCOSAMINE PO) Take 3,000 mg by mouth every other day.   lisinopril (ZESTRIL) 20 MG tablet TAKE 1 TABLET EVERY DAY (Patient taking differently: Take 20 mg by mouth daily.)   melatonin 5 MG TABS Take 1 tablet (5 mg total) by mouth at bedtime as needed.   metoprolol tartrate (LOPRESSOR) 25 MG tablet TAKE 1/2 TABLET TWICE DAILY, HOLD FOR HEART RATE LESS THAN 60, SYSTOLIC BLOOD PRESSURE LESS THAN OR EQUAL TO 105 (Patient taking differently: Take 12.5 mg by mouth 2 (two) times daily.)   omeprazole (PRILOSEC) 40 MG capsule TAKE 1 CAPSULE EVERY DAY (Patient taking differently: Take 40 mg by mouth daily.)   vitamin B-12 (CYANOCOBALAMIN) 1000 MCG tablet Take 1,000 mcg by mouth daily.   No facility-administered encounter medications on file as of 07/28/2022.    Allergies (verified) Patient has no known allergies.   History: Past Medical History:  Diagnosis Date   Arthritis    Cataract    Esophageal stricture    GERD (gastroesophageal reflux disease)    Hyperlipidemia    Hypertension    Liver disease    hepatitis   Normocytic anemia 06/04/2022   Rhabdomyolysis 06/04/2022   Severe aortic stenosis    Past Surgical History:  Procedure Laterality Date   AORTIC VALVE REPLACEMENT N/A 11/04/2021   Procedure: AORTIC VALVE REPLACEMENT (AVR) USING 23 MM INSPIRIS RESILIA  AORTIC VALVE;  Surgeon: Lajuana Matte, MD;  Location: [place];  Service: Open Heart Surgery;  Laterality: N/A;   cataract  2013   CORONARY ARTERY BYPASS GRAFT N/A 11/04/2021   Procedure: CORONARY ARTERY BYPASS GRAFTING (CABG) X 3, USING LEFT INTERNAL MAMMARY ARTERY AND ENDOSCOPICALLY HARVESTED RIGHT GREATER SAPHENOUS VEIN. LIMA TO LAD, SVG TO PDA, SVG TO OM;  Surgeon: Lajuana Matte, MD;  Location: [place];  Service: Open Heart Surgery;  Laterality: N/A;   ENDOVEIN HARVEST OF GREATER SAPHENOUS VEIN Right 11/04/2021   Procedure:  ENDOVEIN HARVEST OF GREATER SAPHENOUS VEIN;  Surgeon: Lajuana Matte, MD;  Location: [place];  Service: Open Heart Surgery;  Laterality: Right;   EXPLORATION POST OPERATIVE OPEN HEART N/A 11/05/2021   Procedure: EXPLORATION POST OPERATIVE OPEN HEARTFOR BLEEDING;  Surgeon: Melrose Nakayama, MD;  Location: [place];  Service: Open Heart Surgery;  Laterality: N/A;   HEMORROIDECTOMY  2001   HERNIA REPAIR  2005   INTRAVASCULAR PRESSURE WIRE/FFR STUDY N/A 11/03/2021   Procedure: INTRAVASCULAR PRESSURE WIRE/FFR STUDY;  Surgeon: Burnell Blanks, MD;  Location: [facility];  Service: Cardiovascular;  Laterality: N/A;   PACEMAKER IMPLANT N/A 11/09/2021   Procedure: PACEMAKER IMPLANT;  Surgeon: Vickie Epley, MD;  Location: [place] [facility];  Service: Cardiovascular;  Laterality: N/A;   RIGHT/LEFT HEART CATH AND CORONARY ANGIOGRAPHY N/A 11/03/2021   Procedure: RIGHT/LEFT HEART CATH AND CORONARY ANGIOGRAPHY;  Surgeon: Burnell Blanks, MD;  Location: [place];  Service: Cardiovascular;  Laterality: N/A;   TEE WITHOUT CARDIOVERSION N/A 11/04/2021   Procedure: TRANSESOPHAGEAL ECHOCARDIOGRAM (TEE);  Surgeon: Lajuana Matte, MD;  Location: [place];  Service: Open Heart Surgery;  Laterality: N/A;   TEMPORARY PACEMAKER N/A 11/07/2021   Procedure: TEMPORARY PACEMAKER;  Surgeon: [name], MD;  Location: [facility];  Service: Cardiovascular;  Laterality: N/A;   TONSILLECTOMY  1948   Family History  Problem Relation Age of Onset   Heart failure Mother 37   Heart failure Father 6       Scarlet fever as child-Valve disease   Cancer Maternal Grandmother    Diabetes Child    Heart Problems Child 61       cardiac arrest   Social History   Socioeconomic History   Marital status: Widowed    Spouse name: Not on file   Number of children: 3   Years of education: Not on file   Highest education level: Not on file  Occupational History   Occupation:  retired Estate agent for [employer]   Occupation: Radiographer, 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  Tobacco Use   Smoking status: Former    Types: Cigarettes    Quit date: 1965    Years since quitting: 58.8   Smokeless tobacco: Never  Vaping Use   Vaping Use: Never used  Substance and Sexual Activity   Alcohol use: Yes    Alcohol/week: 21.0 standard drinks of alcohol    Types: 21 Shots of liquor per week    Comment: daily a few cocktails   Drug use: No   Sexual activity: Never  Other Topics Concern   Not on file  Social History Narrative   Epworth Sleepiness Scale = 3 (as of 07/15/2016)   Social Determinants of Health   Financial Resource Strain: Low Risk  (07/24/2021)   Overall Financial Resource Strain (CARDIA)    Difficulty of Paying Living Expenses: Not hard at all  Food Insecurity: No Food Insecurity (07/24/2021)  Hunger Vital Sign    Worried About Running Out of Food in the Last Year: Never true    Ran Out of Food in the Last Year: Never true  Transportation Needs: No Transportation Needs (07/24/2021)   PRAPARE - Hydrologist (Medical): No    Lack of Transportation (Non-Medical): No  Physical Activity: Insufficiently Active (07/28/2022)   Exercise Vital Sign    Days of Exercise per Week: 7 days    Minutes of Exercise per Session: 20 min  Stress: No Stress Concern Present (07/24/2021)   [place]    Feeling of Stress : Not at all  Social Connections: Socially Isolated (07/28/2022)   Social Connection and Isolation Panel [NHANES]    Frequency of Communication with Friends and Family: Once a week    Frequency of Social Gatherings with Friends and Family: Once a week    Attends Religious Services: More than 4 times per year    Active Member of [employer] or Organizations: No    Attends Archivist Meetings: Never    Marital Status: Widowed    Tobacco Counseling Counseling given: Not  Answered   Clinical Intake:  Pre-visit preparation completed: Yes  Pain : No/denies pain  How often do you need to have someone help you when you read instructions, pamphlets, or other written materials from your doctor or pharmacy?: 1 - Never  Diabetic? No  Activities of Daily Living    07/28/2022    1:49 PM 11/05/2021    2:00 PM  In your present state of health, do you have any difficulty performing the following activities:  Hearing? 0 0  Vision? 1 1  Difficulty concentrating or making decisions? 0 0  Walking or climbing stairs? 0 1  Dressing or bathing? 0 0  Doing errands, shopping? 0 0  Preparing Food and eating ? N   Using the Toilet? N   In the past six months, have you accidently leaked urine? N   Do you have problems with loss of bowel control? N   Managing your Medications? N   Managing your Finances? N   Housekeeping or managing your Housekeeping? N     Patient Care Team: Shelda Pal, DO as PCP - General (Family Medicine) Vickie Epley, MD as PCP - Electrophysiology (Cardiology) Croitoru, Dani Gobble, MD as PCP - Cardiology (Cardiology)  Indicate any recent Medical Services you may have received from other than [facility] providers in the past year (date may be approximate).     Assessment:   This is a routine wellness examination for [place].  Hearing/Vision screen No results found.  Dietary issues and exercise activities discussed: Current Exercise Habits: Home exercise routine, Type of exercise: stretching;calisthenics, Time (Minutes): 20, Frequency (Times/Week): 7, Weekly Exercise (Minutes/Week): 140, Intensity: Mild, Exercise limited by: None identified   Goals Addressed   None    Depression Screen    07/28/2022    1:48 PM 07/24/2021    1:12 PM 04/09/2021   10:10 AM 04/01/2020    1:11 PM 12/28/2017   10:21 AM  PHQ 2/9 Scores  PHQ - 2 Score 0 1 0 1 0    Fall Risk    07/28/2022    1:47 PM 08/01/2021    4:03 PM 07/24/2021    1:10 PM  04/09/2021   10:10 AM 04/01/2020    1:05 PM  [place] in the past year? 1 1 0  0 1  Number falls in past yr: 1 0 0 0 1  Injury with Fall? 1 0 0 0 0  Risk for fall due to : History of fall(s)  No Fall Risks No Fall Risks Impaired balance/gait;History of fall(s)  Follow up Falls evaluation completed Falls evaluation completed  Falls evaluation completed Education provided;Falls prevention discussed    FALL RISK PREVENTION PERTAINING TO THE HOME:  Any stairs in or around the home? No  If so, are there any without handrails?  No stairs Home free of loose throw rugs in walkways, pet beds, electrical cords, etc? Yes  Adequate lighting in your home to reduce risk of falls? Yes   ASSISTIVE DEVICES UTILIZED TO PREVENT FALLS:  Life alert? No  Use of a cane, walker or w/c? Yes  Grab bars in the bathroom? Yes  Shower chair or bench in shower? Yes  Elevated toilet seat or a handicapped toilet? Yes   TIMED UP AND GO:  Was the test performed?  Audio visit .    Cognitive Function:    12/28/2017   10:25 AM  MMSE - Mini Mental State Exam  Orientation to time 5  Orientation to Place 5  Registration 3  Attention/ Calculation 5  Recall 3  Language- name 2 objects 2  Language- repeat 1  Language- follow 3 step command 3  Language- read & follow direction 1  Write a sentence 1  Copy design 1  Total score 30        07/28/2022    1:55 PM  6CIT Screen  What Year? 0 points  What month? 0 points  What time? 0 points  Count back from 20 0 points  Months in reverse 0 points  Repeat phrase 0 points  Total Score 0 points    Immunizations Immunization History  Administered Date(s) Administered   Influenza, High Dose Seasonal PF 07/19/2017, 06/19/2020   Influenza-Unspecified 07/20/2015, 07/10/2021   PFIZER Comirnaty(Gray Top)Covid-19 Tri-Sucrose Vaccine 01/23/2021   PFIZER(Purple Top)SARS-COV-2 Vaccination 12/03/2019, 12/25/2019, 07/24/2020   PNEUMOCOCCAL CONJUGATE-20  10/24/2021   Pfizer Covid-19 Vaccine Bivalent Booster 1yr & up 07/10/2021, 05/26/2022   Pneumococcal Polysaccharide-23 04/22/2018   Tdap 04/22/2018    TDAP status: Up to date  Flu Vaccine status: Due, Education has been provided regarding the importance of this vaccine. Advised may receive this vaccine at local pharmacy or Health Dept. Aware to provide a copy of the vaccination record if obtained from local pharmacy or Health Dept. Verbalized acceptance and understanding.  Pneumococcal vaccine status: Up to date  Covid-19 vaccine status: Information provided on how to obtain vaccines.   Qualifies for Shingles Vaccine? Yes   Zostavax completed No   Shingrix Completed?: No.    Education has been provided regarding the importance of this vaccine. Patient has been advised to call insurance company to determine out of pocket expense if they have not yet received this vaccine. Advised may also receive vaccine at local pharmacy or Health Dept. Verbalized acceptance and understanding.  Screening Tests Health Maintenance  Topic Date Due   INFLUENZA VACCINE  05/19/2022   COVID-19 Vaccine (7 - Pfizer risk series) 07/21/2022   TETANUS/TDAP  04/22/2028   Pneumonia Vaccine 86 Years old  Completed   HPV VACCINES  Aged Out   Zoster Vaccines- Shingrix  Discontinued    Health Maintenance  Health Maintenance Due  Topic Date Due   INFLUENZA VACCINE  05/19/2022   COVID-19 Vaccine (7 - Pfizer risk series) 07/21/2022    Colorectal  cancer screening: No longer required.   Lung Cancer Screening: (Low Dose CT Chest recommended if Age 33-80 years, 30 pack-year currently smoking OR have quit w/in 15years.) does not qualify.   Lung Cancer Screening Referral: N/a  Additional Screening:  Hepatitis C Screening: does not qualify; Completed N/a  Vision Screening: Recommended annual ophthalmology exams for early detection of glaucoma and other disorders of the eye. Is the patient up to date with their  annual eye exam?  No  Who is the provider or what is the name of the office in which the patient attends annual eye exams? [place]. If pt is not established with a provider, would they like to be referred to a provider to establish care? No .   Dental Screening: Recommended annual dental exams for proper oral hygiene  Community Resource Referral / Chronic Care Management: CRR required this visit?  No   CCM required this visit?  No      Plan:     I have personally reviewed and noted the following in the patient's chart:   Medical and social history Use of alcohol, tobacco or illicit drugs  Current medications and supplements including opioid prescriptions. Patient is not currently taking opioid prescriptions. Functional ability and status Nutritional status Physical activity Advanced directives List of other physicians Hospitalizations, surgeries, and ER visits in previous 12 months Vitals Screenings to include cognitive, depression, and falls Referrals and appointments  In addition, I have reviewed and discussed with patient certain preventive protocols, quality metrics, and best practice recommendations. A written personalized care plan for preventive services as well as general preventive health recommendations were provided to patient.   Due to this being a telephonic visit, the after visit summary with patients personalized plan was offered to patient via mail or my-chart. Patient would like to access on my-chart.  Beatris Ship, [place]   07/28/2022   Nurse Notes: None

## 2022-07-28 NOTE — Telephone Encounter (Signed)
Patient had some questions about the social determinants listed from his medicare wellness visit today. Please call at [phone] to advise.

## 2022-07-28 NOTE — Patient Instructions (Signed)
[name] , Thank you for taking time to come for your Medicare Wellness Visit. I appreciate your ongoing commitment to your health goals. Please review the following plan we discussed and let me know if I can assist you in the future.   These are the goals we discussed:  Goals      Maintain current health        This is a list of the screening recommended for you and due dates:  Health Maintenance  Topic Date Due   Flu Shot  05/19/2022   COVID-19 Vaccine (7 - Pfizer risk series) 07/21/2022   Tetanus Vaccine  04/22/2028   Pneumonia Vaccine  Completed   HPV Vaccine  Aged Out   Zoster (Shingles) Vaccine  Discontinued     Next appointment: Follow up in one year for your annual wellness visit.   Preventive Care 17 Years and Older, Male Preventive care refers to lifestyle choices and visits with your health care provider that can promote health and wellness. What does preventive care include? A yearly physical exam. This is also called an annual well check. Dental exams once or twice a year. Routine eye exams. Ask your health care provider how often you should have your eyes checked. Personal lifestyle choices, including: Daily care of your teeth and gums. Regular physical activity. Eating a healthy diet. Avoiding tobacco and drug use. Limiting alcohol use. Practicing safe sex. Taking low doses of aspirin every day. Taking vitamin and mineral supplements as recommended by your health care provider. What happens during an annual well check? The services and screenings done by your health care provider during your annual well check will depend on your age, overall health, lifestyle risk factors, and family history of disease. Counseling  Your health care provider may ask you questions about your: Alcohol use. Tobacco use. Drug use. Emotional well-being. Home and relationship well-being. Sexual activity. Eating habits. History of falls. Memory and ability to understand  (cognition). Work and work Statistician. Screening  You may have the following tests or measurements: Height, weight, and BMI. Blood pressure. Lipid and cholesterol levels. These may be checked every 5 years, or more frequently if you are over 21 years old. Skin check. Lung cancer screening. You may have this screening every year starting at age 35 if you have a 30-pack-year history of smoking and currently smoke or have quit within the past 15 years. Fecal occult blood test (FOBT) of the stool. You may have this test every year starting at age 9. Flexible sigmoidoscopy or colonoscopy. You may have a sigmoidoscopy every 5 years or a colonoscopy every 10 years starting at age 78. Prostate cancer screening. Recommendations will vary depending on your family history and other risks. Hepatitis C blood test. Hepatitis B blood test. Sexually transmitted disease (STD) testing. Diabetes screening. This is done by checking your blood sugar (glucose) after you have not eaten for a while (fasting). You may have this done every 1-3 years. Abdominal aortic aneurysm (AAA) screening. You may need this if you are a current or former smoker. Osteoporosis. You may be screened starting at age 60 if you are at high risk. Talk with your health care provider about your test results, treatment options, and if necessary, the need for more tests. Vaccines  Your health care provider may recommend certain vaccines, such as: Influenza vaccine. This is recommended every year. Tetanus, diphtheria, and acellular pertussis (Tdap, Td) vaccine. You may need a Td booster every 10 years. Zoster vaccine. You  may need this after age 79. Pneumococcal 13-valent conjugate (PCV13) vaccine. One dose is recommended after age 97. Pneumococcal polysaccharide (PPSV23) vaccine. One dose is recommended after age 80. Talk to your health care provider about which screenings and vaccines you need and how often you need them. This  information is not intended to replace advice given to you by your health care provider. Make sure you discuss any questions you have with your health care provider. Document Released: 11/01/2015 Document Revised: 06/24/2016 Document Reviewed: 08/06/2015 Elsevier Interactive Patient Education  2017 [place] Prevention in the Home Falls can cause injuries. They can happen to people of all ages. There are many things you can do to make your home safe and to help prevent falls. What can I do on the outside of my home? Regularly fix the edges of walkways and driveways and fix any cracks. Remove anything that might make you trip as you walk through a door, such as a raised step or threshold. Trim any bushes or trees on the path to your home. Use bright outdoor lighting. Clear any walking paths of anything that might make someone trip, such as rocks or tools. Regularly check to see if handrails are loose or broken. Make sure that both sides of any steps have handrails. Any raised decks and porches should have guardrails on the edges. Have any leaves, snow, or ice cleared regularly. Use sand or salt on walking paths during winter. Clean up any spills in your garage right away. This includes oil or grease spills. What can I do in the bathroom? Use night lights. Install grab bars by the toilet and in the tub and shower. Do not use towel bars as grab bars. Use non-skid mats or decals in the tub or shower. If you need to sit down in the shower, use a plastic, non-slip stool. Keep the floor dry. Clean up any water that spills on the floor as soon as it happens. Remove soap buildup in the tub or shower regularly. Attach bath mats securely with double-sided non-slip rug tape. Do not have throw rugs and other things on the floor that can make you trip. What can I do in the bedroom? Use night lights. Make sure that you have a light by your bed that is easy to reach. Do not use any sheets or  blankets that are too big for your bed. They should not hang down onto the floor. Have a firm chair that has side arms. You can use this for support while you get dressed. Do not have throw rugs and other things on the floor that can make you trip. What can I do in the kitchen? Clean up any spills right away. Avoid walking on wet floors. Keep items that you use a lot in easy-to-reach places. If you need to reach something above you, use a strong step stool that has a grab bar. Keep electrical cords out of the way. Do not use floor polish or wax that makes floors slippery. If you must use wax, use non-skid floor wax. Do not have throw rugs and other things on the floor that can make you trip. What can I do with my stairs? Do not leave any items on the stairs. Make sure that there are handrails on both sides of the stairs and use them. Fix handrails that are broken or loose. Make sure that handrails are as long as the stairways. Check any carpeting to make sure that it is firmly  attached to the stairs. Fix any carpet that is loose or worn. Avoid having throw rugs at the top or bottom of the stairs. If you do have throw rugs, attach them to the floor with carpet tape. Make sure that you have a light switch at the top of the stairs and the bottom of the stairs. If you do not have them, ask someone to add them for you. What else can I do to help prevent falls? Wear shoes that: Do not have high heels. Have rubber bottoms. Are comfortable and fit you well. Are closed at the toe. Do not wear sandals. If you use a stepladder: Make sure that it is fully opened. Do not climb a closed stepladder. Make sure that both sides of the stepladder are locked into place. Ask someone to hold it for you, if possible. Clearly mark and make sure that you can see: Any grab bars or handrails. First and last steps. Where the edge of each step is. Use tools that help you move around (mobility aids) if they are  needed. These include: Canes. Walkers. Scooters. Crutches. Turn on the lights when you go into a dark area. Replace any light bulbs as soon as they burn out. Set up your furniture so you have a clear path. Avoid moving your furniture around. If any of your floors are uneven, fix them. If there are any pets around you, be aware of where they are. Review your medicines with your doctor. Some medicines can make you feel dizzy. This can increase your chance of falling. Ask your doctor what other things that you can do to help prevent falls. This information is not intended to replace advice given to you by your health care provider. Make sure you discuss any questions you have with your health care provider. Document Released: 08/01/2009 Document Revised: 03/12/2016 Document Reviewed: 11/09/2014 Elsevier Interactive Patient Education  2017 [employer].

## 2022-07-28 NOTE — Telephone Encounter (Signed)
Spoke with pt and addressed concerns.

## 2022-07-29 DIAGNOSIS — L602 Onychogryphosis: Secondary | ICD-10-CM | POA: Diagnosis not present

## 2022-07-29 DIAGNOSIS — M79672 Pain in left foot: Secondary | ICD-10-CM | POA: Diagnosis not present

## 2022-07-29 DIAGNOSIS — M79671 Pain in right foot: Secondary | ICD-10-CM | POA: Diagnosis not present

## 2022-08-03 ENCOUNTER — Telehealth: Payer: Self-pay | Admitting: Family Medicine

## 2022-08-03 NOTE — Telephone Encounter (Signed)
PCP request appt. Scheduled 08/07/22 at 12:45.

## 2022-08-03 NOTE — Telephone Encounter (Signed)
Pt dropped off document to be filled out by provider [facility] report form 3 pages. Pt would like to be called when document ready for pick up pt tel [phone]. Document put at front office tray under providers name.

## 2022-08-07 ENCOUNTER — Ambulatory Visit (INDEPENDENT_AMBULATORY_CARE_PROVIDER_SITE_OTHER): Payer: Medicare HMO | Admitting: Family Medicine

## 2022-08-07 ENCOUNTER — Encounter: Payer: Self-pay | Admitting: Family Medicine

## 2022-08-07 VITALS — BP 118/78 | HR 72 | Temp 98.7°F | Ht 71.5 in | Wt 177.2 lb

## 2022-08-07 DIAGNOSIS — Z23 Encounter for immunization: Secondary | ICD-10-CM | POA: Diagnosis not present

## 2022-08-07 DIAGNOSIS — R5381 Other malaise: Secondary | ICD-10-CM | POA: Diagnosis not present

## 2022-08-07 NOTE — Patient Instructions (Signed)
Let us know if you need anything.  

## 2022-08-07 NOTE — Progress Notes (Signed)
Chief Complaint  Patient presents with   paperwork for nursing home    Subjective: Patient is a 86 y.o. male here for completion of paperwork.  In a couple months, the patient is going to join [facility] in [place].  He is here for completion of a form.  He has never been screened for tuberculosis but denies any weight loss, dry cough, or night sweats.  He has not been to any TB endemic areas.  He did have a chest x-ray 2 months ago showing evidence of prior sternotomy without active cardiopulmonary disease.  He is joining this independent living ability due to relatively frequent falls.  He has no cognitive issues or behavioral disturbances.  He is continent of both urine and stool.  He is able to administer medicine as himself.  Past Medical History:  Diagnosis Date   Arthritis    Cataract    Esophageal stricture    GERD (gastroesophageal reflux disease)    Hyperlipidemia    Hypertension    Liver disease    hepatitis   Normocytic anemia 06/04/2022   Rhabdomyolysis 06/04/2022   Severe aortic stenosis     Objective: BP 118/78 (BP Location: Left Arm, Patient Position: Sitting, Cuff Size: Normal)   Pulse 72   Temp 98.7 F (37.1 C) (Oral)   Ht 5' 11.5" (1.816 m)   Wt 177 lb 4 oz (80.4 kg)   SpO2 99%   BMI 24.38 kg/m  General: Awake, appears stated age Heart: RRR, no LE edema Lungs: CTAB, no rales, wheezes or rhonchi. No accessory muscle use Neuro: Gait is slow and cautious.  No cerebellar signs. Psych: Age appropriate judgment and insight, normal affect and mood  Assessment and Plan: Physical deconditioning  Need for influenza vaccination - Plan: Flu Vaccine QUAD High Dose(Fluad)  Form filled out in entirety today.  Copy made for our records.  Flu shot today as well. The patient voiced understanding and agreement to the plan.  I spent 20 minutes with the patient discussing the above plan in addition to reviewing his chart and filling out paperwork on the same day of the  visit.  Fisher, DO 08/07/22  2:35 PM

## 2022-08-09 ENCOUNTER — Emergency Department (HOSPITAL_COMMUNITY): Payer: Medicare HMO

## 2022-08-09 ENCOUNTER — Inpatient Hospital Stay (HOSPITAL_COMMUNITY)
Admission: EM | Admit: 2022-08-09 | Discharge: 2022-08-15 | DRG: 682 | Disposition: A | Discharge status: Skilled Nursing Facility | Payer: Medicare HMO | Attending: Family Medicine | Admitting: Family Medicine

## 2022-08-09 ENCOUNTER — Encounter (HOSPITAL_COMMUNITY): Payer: Self-pay

## 2022-08-09 DIAGNOSIS — R400 Somnolence: Principal | ICD-10-CM

## 2022-08-09 DIAGNOSIS — R1310 Dysphagia, unspecified: Secondary | ICD-10-CM | POA: Diagnosis present

## 2022-08-09 DIAGNOSIS — R6520 Severe sepsis without septic shock: Secondary | ICD-10-CM | POA: Diagnosis not present

## 2022-08-09 DIAGNOSIS — I82611 Acute embolism and thrombosis of superficial veins of right upper extremity: Secondary | ICD-10-CM | POA: Diagnosis present

## 2022-08-09 DIAGNOSIS — G5631 Lesion of radial nerve, right upper limb: Secondary | ICD-10-CM | POA: Diagnosis present

## 2022-08-09 DIAGNOSIS — Z23 Encounter for immunization: Secondary | ICD-10-CM

## 2022-08-09 DIAGNOSIS — R29898 Other symptoms and signs involving the musculoskeletal system: Secondary | ICD-10-CM

## 2022-08-09 DIAGNOSIS — R410 Disorientation, unspecified: Secondary | ICD-10-CM | POA: Diagnosis not present

## 2022-08-09 DIAGNOSIS — S199XXA Unspecified injury of neck, initial encounter: Secondary | ICD-10-CM | POA: Diagnosis not present

## 2022-08-09 DIAGNOSIS — R531 Weakness: Secondary | ICD-10-CM | POA: Diagnosis not present

## 2022-08-09 DIAGNOSIS — R0902 Hypoxemia: Secondary | ICD-10-CM | POA: Diagnosis not present

## 2022-08-09 DIAGNOSIS — N179 Acute kidney failure, unspecified: Principal | ICD-10-CM | POA: Diagnosis present

## 2022-08-09 DIAGNOSIS — R7401 Elevation of levels of liver transaminase levels: Secondary | ICD-10-CM | POA: Diagnosis not present

## 2022-08-09 DIAGNOSIS — T796XXA Traumatic ischemia of muscle, initial encounter: Secondary | ICD-10-CM | POA: Diagnosis not present

## 2022-08-09 DIAGNOSIS — I959 Hypotension, unspecified: Secondary | ICD-10-CM | POA: Diagnosis not present

## 2022-08-09 DIAGNOSIS — I2489 Other forms of acute ischemic heart disease: Secondary | ICD-10-CM | POA: Diagnosis not present

## 2022-08-09 DIAGNOSIS — I6782 Cerebral ischemia: Secondary | ICD-10-CM | POA: Diagnosis not present

## 2022-08-09 DIAGNOSIS — R2689 Other abnormalities of gait and mobility: Secondary | ICD-10-CM | POA: Diagnosis not present

## 2022-08-09 DIAGNOSIS — M6282 Rhabdomyolysis: Secondary | ICD-10-CM | POA: Diagnosis not present

## 2022-08-09 DIAGNOSIS — G9341 Metabolic encephalopathy: Secondary | ICD-10-CM | POA: Diagnosis present

## 2022-08-09 DIAGNOSIS — K7200 Acute and subacute hepatic failure without coma: Secondary | ICD-10-CM | POA: Diagnosis not present

## 2022-08-09 DIAGNOSIS — I455 Other specified heart block: Secondary | ICD-10-CM | POA: Diagnosis not present

## 2022-08-09 DIAGNOSIS — R404 Transient alteration of awareness: Secondary | ICD-10-CM | POA: Diagnosis not present

## 2022-08-09 DIAGNOSIS — K8020 Calculus of gallbladder without cholecystitis without obstruction: Secondary | ICD-10-CM | POA: Diagnosis not present

## 2022-08-09 DIAGNOSIS — E860 Dehydration: Secondary | ICD-10-CM | POA: Diagnosis present

## 2022-08-09 DIAGNOSIS — Z809 Family history of malignant neoplasm, unspecified: Secondary | ICD-10-CM | POA: Diagnosis not present

## 2022-08-09 DIAGNOSIS — I350 Nonrheumatic aortic (valve) stenosis: Secondary | ICD-10-CM | POA: Diagnosis not present

## 2022-08-09 DIAGNOSIS — R7989 Other specified abnormal findings of blood chemistry: Secondary | ICD-10-CM | POA: Diagnosis present

## 2022-08-09 DIAGNOSIS — I2510 Atherosclerotic heart disease of native coronary artery without angina pectoris: Secondary | ICD-10-CM | POA: Diagnosis present

## 2022-08-09 DIAGNOSIS — A419 Sepsis, unspecified organism: Secondary | ICD-10-CM | POA: Diagnosis not present

## 2022-08-09 DIAGNOSIS — Z950 Presence of cardiac pacemaker: Secondary | ICD-10-CM

## 2022-08-09 DIAGNOSIS — Z8249 Family history of ischemic heart disease and other diseases of the circulatory system: Secondary | ICD-10-CM

## 2022-08-09 DIAGNOSIS — R41841 Cognitive communication deficit: Secondary | ICD-10-CM | POA: Diagnosis not present

## 2022-08-09 DIAGNOSIS — J9811 Atelectasis: Secondary | ICD-10-CM | POA: Diagnosis not present

## 2022-08-09 DIAGNOSIS — Z951 Presence of aortocoronary bypass graft: Secondary | ICD-10-CM | POA: Diagnosis not present

## 2022-08-09 DIAGNOSIS — R000 Tachycardia, unspecified: Secondary | ICD-10-CM | POA: Diagnosis not present

## 2022-08-09 DIAGNOSIS — I10 Essential (primary) hypertension: Secondary | ICD-10-CM | POA: Diagnosis present

## 2022-08-09 DIAGNOSIS — Z953 Presence of xenogenic heart valve: Secondary | ICD-10-CM | POA: Diagnosis not present

## 2022-08-09 DIAGNOSIS — E872 Acidosis, unspecified: Secondary | ICD-10-CM | POA: Diagnosis not present

## 2022-08-09 DIAGNOSIS — D6959 Other secondary thrombocytopenia: Secondary | ICD-10-CM | POA: Diagnosis present

## 2022-08-09 DIAGNOSIS — D638 Anemia in other chronic diseases classified elsewhere: Secondary | ICD-10-CM | POA: Diagnosis not present

## 2022-08-09 DIAGNOSIS — E7800 Pure hypercholesterolemia, unspecified: Secondary | ICD-10-CM | POA: Diagnosis not present

## 2022-08-09 DIAGNOSIS — W19XXXA Unspecified fall, initial encounter: Secondary | ICD-10-CM

## 2022-08-09 DIAGNOSIS — Z20822 Contact with and (suspected) exposure to covid-19: Secondary | ICD-10-CM | POA: Diagnosis present

## 2022-08-09 DIAGNOSIS — I6523 Occlusion and stenosis of bilateral carotid arteries: Secondary | ICD-10-CM | POA: Diagnosis not present

## 2022-08-09 DIAGNOSIS — Z87891 Personal history of nicotine dependence: Secondary | ICD-10-CM

## 2022-08-09 DIAGNOSIS — R4182 Altered mental status, unspecified: Secondary | ICD-10-CM | POA: Diagnosis not present

## 2022-08-09 DIAGNOSIS — M21331 Wrist drop, right wrist: Secondary | ICD-10-CM | POA: Diagnosis present

## 2022-08-09 DIAGNOSIS — R600 Localized edema: Secondary | ICD-10-CM | POA: Diagnosis not present

## 2022-08-09 DIAGNOSIS — R945 Abnormal results of liver function studies: Secondary | ICD-10-CM | POA: Diagnosis not present

## 2022-08-09 DIAGNOSIS — R6510 Systemic inflammatory response syndrome (SIRS) of non-infectious origin without acute organ dysfunction: Secondary | ICD-10-CM | POA: Diagnosis not present

## 2022-08-09 DIAGNOSIS — Z95810 Presence of automatic (implantable) cardiac defibrillator: Secondary | ICD-10-CM | POA: Diagnosis not present

## 2022-08-09 DIAGNOSIS — R778 Other specified abnormalities of plasma proteins: Secondary | ICD-10-CM | POA: Diagnosis not present

## 2022-08-09 DIAGNOSIS — G319 Degenerative disease of nervous system, unspecified: Secondary | ICD-10-CM | POA: Diagnosis not present

## 2022-08-09 DIAGNOSIS — M6281 Muscle weakness (generalized): Secondary | ICD-10-CM | POA: Diagnosis not present

## 2022-08-09 DIAGNOSIS — M19011 Primary osteoarthritis, right shoulder: Secondary | ICD-10-CM | POA: Diagnosis not present

## 2022-08-09 DIAGNOSIS — E876 Hypokalemia: Secondary | ICD-10-CM | POA: Diagnosis present

## 2022-08-09 DIAGNOSIS — M47812 Spondylosis without myelopathy or radiculopathy, cervical region: Secondary | ICD-10-CM | POA: Diagnosis not present

## 2022-08-09 DIAGNOSIS — Z7401 Bed confinement status: Secondary | ICD-10-CM | POA: Diagnosis not present

## 2022-08-09 DIAGNOSIS — Z833 Family history of diabetes mellitus: Secondary | ICD-10-CM

## 2022-08-09 HISTORY — DX: Presence of cardiac pacemaker: Z95.0

## 2022-08-09 LAB — CBC WITH DIFFERENTIAL/PLATELET
Abs Immature Granulocytes: 0.14 10*3/uL — ABNORMAL HIGH (ref 0.00–0.07)
Basophils Absolute: 0 10*3/uL (ref 0.0–0.1)
Basophils Relative: 0 %
Eosinophils Absolute: 0 10*3/uL (ref 0.0–0.5)
Eosinophils Relative: 0 %
HCT: 44.1 % (ref 39.0–52.0)
Hemoglobin: 15.5 g/dL (ref 13.0–17.0)
Immature Granulocytes: 1 %
Lymphocytes Relative: 3 %
Lymphs Abs: 0.4 10*3/uL — ABNORMAL LOW (ref 0.7–4.0)
MCH: 34.6 pg — ABNORMAL HIGH (ref 26.0–34.0)
MCHC: 35.1 g/dL (ref 30.0–36.0)
MCV: 98.4 fL (ref 80.0–100.0)
Monocytes Absolute: 1 10*3/uL (ref 0.1–1.0)
Monocytes Relative: 7 %
Neutro Abs: 12.5 10*3/uL — ABNORMAL HIGH (ref 1.7–7.7)
Neutrophils Relative %: 89 %
Platelets: 138 10*3/uL — ABNORMAL LOW (ref 150–400)
RBC: 4.48 MIL/uL (ref 4.22–5.81)
RDW: 12.3 % (ref 11.5–15.5)
WBC: 14 10*3/uL — ABNORMAL HIGH (ref 4.0–10.5)
nRBC: 0 % (ref 0.0–0.2)

## 2022-08-09 LAB — URINALYSIS, ROUTINE W REFLEX MICROSCOPIC
Glucose, UA: 100 mg/dL — AB
Ketones, ur: 15 mg/dL — AB
Leukocytes,Ua: NEGATIVE
Nitrite: NEGATIVE
Protein, ur: 100 mg/dL — AB
Specific Gravity, Urine: >1.03 — ABNORMAL HIGH (ref 1.005–1.030)
pH: 5.5 (ref 5.0–8.0)

## 2022-08-09 LAB — COMPREHENSIVE METABOLIC PANEL
ALT: 37 U/L (ref 0–44)
AST: 132 U/L — ABNORMAL HIGH (ref 15–41)
Albumin: 4.3 g/dL (ref 3.5–5.0)
Alkaline Phosphatase: 80 U/L (ref 38–126)
Anion gap: 17 — ABNORMAL HIGH (ref 5–15)
BUN: 19 mg/dL (ref 8–23)
CO2: 24 mmol/L (ref 22–32)
Calcium: 9.5 mg/dL (ref 8.9–10.3)
Chloride: 96 mmol/L — ABNORMAL LOW (ref 98–111)
Creatinine, Ser: 1.22 mg/dL (ref 0.61–1.24)
GFR, Estimated: 59 mL/min — ABNORMAL LOW (ref >60)
Glucose, Bld: 172 mg/dL — ABNORMAL HIGH (ref 70–99)
Potassium: 5 mmol/L (ref 3.5–5.1)
Sodium: 137 mmol/L (ref 135–145)
Total Bilirubin: 2.4 mg/dL — ABNORMAL HIGH (ref 0.3–1.2)
Total Protein: 8 g/dL (ref 6.5–8.1)

## 2022-08-09 LAB — APTT: aPTT: 28 seconds (ref 24–36)

## 2022-08-09 LAB — I-STAT VENOUS BLOOD GAS, ED
Acid-base deficit: 1 mmol/L (ref 0.0–2.0)
Bicarbonate: 24.6 mmol/L (ref 20.0–28.0)
Calcium, Ion: 1.11 mmol/L — ABNORMAL LOW (ref 1.15–1.40)
HCT: 45 % (ref 39.0–52.0)
Hemoglobin: 15.3 g/dL (ref 13.0–17.0)
O2 Saturation: 66 %
Potassium: 5.1 mmol/L (ref 3.5–5.1)
Sodium: 135 mmol/L (ref 135–145)
TCO2: 26 mmol/L (ref 22–32)
pCO2, Ven: 45.5 mmHg (ref 44–60)
pH, Ven: 7.341 (ref 7.25–7.43)
pO2, Ven: 36 mmHg (ref 32–45)

## 2022-08-09 LAB — I-STAT CHEM 8, ED
BUN: 22 mg/dL (ref 8–23)
Calcium, Ion: 1.1 mmol/L — ABNORMAL LOW (ref 1.15–1.40)
Chloride: 100 mmol/L (ref 98–111)
Creatinine, Ser: 1 mg/dL (ref 0.61–1.24)
Glucose, Bld: 171 mg/dL — ABNORMAL HIGH (ref 70–99)
HCT: 46 % (ref 39.0–52.0)
Hemoglobin: 15.6 g/dL (ref 13.0–17.0)
Potassium: 5.1 mmol/L (ref 3.5–5.1)
Sodium: 135 mmol/L (ref 135–145)
TCO2: 24 mmol/L (ref 22–32)

## 2022-08-09 LAB — URINALYSIS, MICROSCOPIC (REFLEX)

## 2022-08-09 LAB — PROTIME-INR
INR: 1.1 (ref 0.8–1.2)
Prothrombin Time: 14 seconds (ref 11.4–15.2)

## 2022-08-09 LAB — SARS CORONAVIRUS 2 BY RT PCR: SARS Coronavirus 2 by RT PCR: NEGATIVE

## 2022-08-09 LAB — LACTIC ACID, PLASMA
Lactic Acid, Venous: 4.2 mmol/L (ref 0.5–1.9)
Lactic Acid, Venous: 3.7 mmol/L (ref 0.5–1.9)

## 2022-08-09 LAB — TROPONIN I (HIGH SENSITIVITY): Troponin I (High Sensitivity): 83 ng/L — ABNORMAL HIGH (ref <18)

## 2022-08-09 LAB — CK: Total CK: 6868 U/L — ABNORMAL HIGH (ref 49–397)

## 2022-08-09 MED ORDER — VANCOMYCIN HCL 1500 MG/300ML IV SOLN
1500.0000 mg | INTRAVENOUS | Status: DC
  Administered 2022-08-10: 1500 mg via INTRAVENOUS
  Filled 2022-08-09: qty 300

## 2022-08-09 MED ORDER — METRONIDAZOLE 500 MG/100ML IV SOLN
500.0000 mg | Freq: Once | INTRAVENOUS | Status: AC
  Administered 2022-08-09: 500 mg via INTRAVENOUS
  Filled 2022-08-09: qty 100

## 2022-08-09 MED ORDER — LACTATED RINGERS IV SOLN: INTRAVENOUS | Status: AC

## 2022-08-09 MED ORDER — ACETAMINOPHEN 650 MG RE SUPP: 650.0000 mg | Freq: Four times a day (QID) | RECTAL | Status: DC | PRN

## 2022-08-09 MED ORDER — LACTATED RINGERS IV BOLUS (SEPSIS)
1000.0000 mL | Freq: Once | INTRAVENOUS | Status: AC
  Administered 2022-08-09: 1000 mL via INTRAVENOUS

## 2022-08-09 MED ORDER — VANCOMYCIN HCL IN DEXTROSE 1-5 GM/200ML-% IV SOLN: 1000.0000 mg | Freq: Once | INTRAVENOUS | Status: DC

## 2022-08-09 MED ORDER — ACETAMINOPHEN 325 MG PO TABS
650.0000 mg | ORAL_TABLET | Freq: Four times a day (QID) | ORAL | Status: DC | PRN
  Administered 2022-08-13: 650 mg via ORAL
  Filled 2022-08-09: qty 2

## 2022-08-09 MED ORDER — VANCOMYCIN HCL 1750 MG/350ML IV SOLN
1750.0000 mg | Freq: Once | INTRAVENOUS | Status: AC
  Administered 2022-08-09: 1750 mg via INTRAVENOUS
  Filled 2022-08-09: qty 350

## 2022-08-09 MED ORDER — LACTATED RINGERS IV BOLUS (SEPSIS)
500.0000 mL | Freq: Once | INTRAVENOUS | Status: AC
  Administered 2022-08-09: 500 mL via INTRAVENOUS

## 2022-08-09 MED ORDER — IOHEXOL 350 MG/ML SOLN
75.0000 mL | Freq: Once | INTRAVENOUS | Status: AC | PRN
  Administered 2022-08-09: 75 mL via INTRAVENOUS

## 2022-08-09 MED ORDER — SODIUM CHLORIDE 0.9 % IV SOLN
2.0000 g | Freq: Once | INTRAVENOUS | Status: AC
  Administered 2022-08-09: 2 g via INTRAVENOUS
  Filled 2022-08-09: qty 12.5

## 2022-08-09 MED ORDER — SODIUM CHLORIDE 0.9 % IV SOLN
2.0000 g | Freq: Two times a day (BID) | INTRAVENOUS | Status: DC
  Administered 2022-08-10 – 2022-08-11 (×3): 2 g via INTRAVENOUS
  Filled 2022-08-09 (×3): qty 12.5

## 2022-08-09 NOTE — Progress Notes (Signed)
Pharmacy Antibiotic Note  Joshua [name] is a 86 y.o. male for which pharmacy has been consulted for cefepime and vancomycin dosing for sepsis.  Patient with a history of Hepatitis, GERD, severe AS, anemia, HTN, HLD. Patient presenting with fall and AMS.  SCr 1 WBC 14; LA 4.2; T 101.1; HR 110; RR 19  Plan: Metronidazole per MD Ceftriaxone 2g q12hr Vancomycin 1750 mg once then 1500 mg q24hr (eAUC 480.9) unless change in renal function Trend WBC, Fever, Renal function F/u cultures, clinical course, WBC, fever De-escalate when able Levels at steady state  Height: '5\' 11"'$  (180.3 cm) Weight: 80.4 kg (177 lb 4 oz) IBW/kg (Calculated) : 75.3  Temp (24hrs), Avg:100.9 F (38.3 C), Min:100.6 F (38.1 C), Max:101.1 F (38.4 C)  Recent Labs  Lab 08/09/22 2004 08/09/22 2009  WBC 14.0*  --   CREATININE  --  1.00  LATICACIDVEN 4.2*  --     Estimated Creatinine Clearance: 59.6 mL/min (by C-G formula based on SCr of 1 mg/dL).    No Known Allergies  Antimicrobials this admission: Cefepime 10/22 >>  flagyl 10/22 >>  vancomycin 10/22 >>   Microbiology results: Pending  Thank you for allowing pharmacy to be a part of this patient's care.  Lorelei Pont, PharmD, BCPS 08/09/2022 9:23 PM ED Clinical Pharmacist -  [phone]

## 2022-08-09 NOTE — ED Notes (Signed)
Pt transported to CT ?

## 2022-08-09 NOTE — ED Notes (Signed)
XRAY to bedside  

## 2022-08-09 NOTE — ED Triage Notes (Incomplete)
BIBA from home, LKW '@1700'$  on 10/21 when he spoke to son on phone. Son came by house today, found patient on bathroom floor caught between toilet and wall. Patient lives alone.   Patient presents to ED with AMS, unable to follow commands, and not verbal. Patient is not moving R arm and R leg is contracted.  Patient takes ASA. Baseline A&OX4.   EMS VS: 116 HR 80% RA, 100% on NRB '@15L'$ 

## 2022-08-09 NOTE — ED Provider Notes (Signed)
[facility] EMERGENCY DEPARTMENT Provider Note   CSN: [id number] Arrival date & time: 08/09/22  1952     History  Chief Complaint  Patient presents with   Fall   Altered Mental Status    Joshua [name] is a 86 y.o. male.  Patient is an 86 year old male with a history of hepatitis, GERD, severe aortic stenosis, anemia, hypertension and hyperlipidemia who is presenting today with EMS after being found altered at his home.  Patient's son gave the history and reports he usually talks to his dad every day.  He last talked to him yesterday at 5 PM and he was his normal self.  Today he tried to call him but he did not answer so we went over to visit him and found him in the bathroom wedged between the toilet and the wall and was minimally responsive.  EMS reports that since they have been there patient has not verbally said anything but has been noted to not move his right upper extremity at all has been tachycardic and tremulous.  Patient has multiple areas of skin breakdown and bruising.  He does not take any anticoagulation other than aspirin.  Patient has been hypertensive with EMS with normal blood sugar.  O2 sats per fire were in the 80s but patient was not perfusing well and has been normal with EMS.  Patient has no prior history of stroke or unilateral weakness.  The history is provided by the EMS personnel and a relative.  Fall  Altered Mental Status      Home Medications Prior to Admission medications   Medication Sig Start Date End Date Taking? Authorizing Provider  aspirin EC 81 MG tablet Take 1 tablet (81 mg total) by mouth daily. Swallow whole. 03/12/22   Croitoru, Mihai, MD  atorvastatin (LIPITOR) 40 MG tablet TAKE 1 TABLET EVERY DAY 07/13/22   Wendling, Crosby Oyster, DO  Glucosamine HCl (GLUCOSAMINE PO) Take 3,000 mg by mouth every other day.    [provider]  lisinopril (ZESTRIL) 20 MG tablet TAKE 1 TABLET EVERY DAY Patient taking  differently: Take 20 mg by mouth daily. 03/02/22   Wendling, Crosby Oyster, DO  melatonin 5 MG TABS Take 1 tablet (5 mg total) by mouth at bedtime as needed. 12/15/21   Medina-Vargas, Monina C, NP  metoprolol tartrate (LOPRESSOR) 25 MG tablet TAKE 1/2 TABLET TWICE DAILY, HOLD FOR HEART RATE LESS THAN 60, SYSTOLIC BLOOD PRESSURE LESS THAN OR EQUAL TO 105 Patient taking differently: Take 12.5 mg by mouth 2 (two) times daily. 04/01/22   Shelda Pal, DO  omeprazole (PRILOSEC) 40 MG capsule TAKE 1 CAPSULE EVERY DAY Patient taking differently: Take 40 mg by mouth daily. 01/28/22   Shelda Pal, DO  vitamin B-12 (CYANOCOBALAMIN) 1000 MCG tablet Take 1,000 mcg by mouth daily.    [provider]      Allergies    Patient has no known allergies.    Review of Systems   Review of Systems  Physical Exam Updated Vital Signs BP (!) 143/81   Pulse (!) 110   Temp (!) 101.1 F (38.4 C) (Rectal)   Resp 19   Ht '5\' 11"'$  (1.803 m)   Wt 80.4 kg   SpO2 100%   BMI 24.72 kg/m  Physical Exam Vitals and nursing note reviewed.  Constitutional:      General: He is not in acute distress.    Appearance: He is well-developed.     Comments: Awake but  not responding  HENT:     Head: Normocephalic and atraumatic.     Mouth/Throat:     Mouth: Mucous membranes are dry.  Eyes:     Conjunctiva/sclera: Conjunctivae normal.     Pupils: Pupils are equal, round, and reactive to light.  Cardiovascular:     Rate and Rhythm: Regular rhythm. Tachycardia present.     Heart sounds: No murmur heard. Pulmonary:     Effort: Pulmonary effort is normal. No respiratory distress.     Breath sounds: Normal breath sounds. No wheezing or rales.  Abdominal:     General: There is no distension.     Palpations: Abdomen is soft.     Tenderness: There is no abdominal tenderness. There is no guarding or rebound.  Musculoskeletal:        General: Signs of injury present. No tenderness. Normal range of  motion.     Cervical back: Normal range of motion and neck supple.     Comments: Able to range all extremities and patient does not appear to have significant pain with range of motion  Skin:    General: Skin is warm and dry.     Findings: No erythema or rash.     Comments: Multiple areas of bruising.  Skin breakdown on bilateral knees, skin breakdown and pressure ulcer noted on patient's right axilla.  Numerous bruises over the upper extremities and chest.  Mottled hands with some cyanosis.  3-4second cap refill.  Poor skin turgor  Neurological:     Comments: Flaccid paralysis of the right upper extremity.  Patient is noted to move bilateral lower extremities and the left upper extremity.  Gaze seems to favor the left  Psychiatric:     Comments: Altered     ED Results / Procedures / Treatments   Labs (all labs ordered are listed, but only abnormal results are displayed) Labs Reviewed  CBC WITH DIFFERENTIAL/PLATELET - Abnormal; Notable for the following components:      Result Value   WBC 14.0 (*)    MCH 34.6 (*)    Platelets 138 (*)    Neutro Abs 12.5 (*)    Lymphs Abs 0.4 (*)    Abs Immature Granulocytes 0.14 (*)    All other components within normal limits  COMPREHENSIVE METABOLIC PANEL - Abnormal; Notable for the following components:   Chloride 96 (*)    Glucose, Bld 172 (*)    AST 132 (*)    Total Bilirubin 2.4 (*)    GFR, Estimated 59 (*)    Anion gap 17 (*)    All other components within normal limits  LACTIC ACID, PLASMA - Abnormal; Notable for the following components:   Lactic Acid, Venous 4.2 (*)    All other components within normal limits  URINALYSIS, ROUTINE W REFLEX MICROSCOPIC - Abnormal; Notable for the following components:   Specific Gravity, Urine >1.030 (*)    Glucose, UA 100 (*)    Hgb urine dipstick LARGE (*)    Bilirubin Urine SMALL (*)    Ketones, ur 15 (*)    Protein, ur 100 (*)    All other components within normal limits  URINALYSIS,  MICROSCOPIC (REFLEX) - Abnormal; Notable for the following components:   Bacteria, UA RARE (*)    All other components within normal limits  I-STAT VENOUS BLOOD GAS, ED - Abnormal; Notable for the following components:   Calcium, Ion 1.11 (*)    All other components within normal limits  I-STAT CHEM  8, ED - Abnormal; Notable for the following components:   Glucose, Bld 171 (*)    Calcium, Ion 1.10 (*)    All other components within normal limits  TROPONIN I (HIGH SENSITIVITY) - Abnormal; Notable for the following components:   Troponin I (High Sensitivity) 83 (*)    All other components within normal limits  CULTURE, BLOOD (ROUTINE X 2)  CULTURE, BLOOD (ROUTINE X 2)  URINE CULTURE  SARS CORONAVIRUS 2 BY RT PCR  PROTIME-INR  APTT  LACTIC ACID, PLASMA  ETHANOL  CK  TROPONIN I (HIGH SENSITIVITY)    EKG EKG Interpretation  Date/Time:  Sunday August 09 2022 20:55:13 EDT Ventricular Rate:  125 PR Interval:  79 QRS Duration: 131 QT Interval:  418 QTC Calculation: 603 R Axis:   -85 Text Interpretation: ATRIAL PACED RHYTHM RBBB and LAFB Lateral leads are also involved Probable RV involvement, suggest recording right precordial leads No significant change since last tracing Confirmed by Blanchie Dessert ([id number]) on 08/09/2022 9:16:22 PM  Radiology DG Shoulder Right Portable  Result Date: 08/09/2022 CLINICAL DATA:  Fall.  Altered mental status. EXAM: RIGHT SHOULDER - 1 VIEW COMPARISON:  None Available. FINDINGS: Degenerative changes in the acromioclavicular and glenohumeral joints. Calcification in the subacromial space consistent with calcific tendinosis. No evidence of acute fracture or dislocation. No focal bone lesion or bone destruction. Vascular calcifications in the soft tissues. IMPRESSION: Degenerative changes in the right shoulder. Soft tissue calcifications suggesting calcific tendinosis. No acute bony abnormalities. Electronically Signed   By: Lucienne Capers M.D.   On:  08/09/2022 22:12   DG Pelvis Portable  Result Date: 08/09/2022 CLINICAL DATA:  Fall, altered mental status EXAM: PORTABLE PELVIS 1-2 VIEWS COMPARISON:  None Available. FINDINGS: Normal alignment. No acute fracture or dislocation. Sacroiliac and hip joint spaces are preserved. Advanced vascular calcifications are noted within the pelvis and medial thighs. IMPRESSION: No acute fracture or dislocation. Electronically Signed   By: Fidela Salisbury M.D.   On: 08/09/2022 21:06   DG Chest Port 1 View  Result Date: 08/09/2022 CLINICAL DATA:  Altered mental status. EXAM: PORTABLE CHEST 1 VIEW COMPARISON:  06/03/2022 FINDINGS: Cardiac pacemaker. Postoperative changes in the mediastinum. Shallow inspiration. Heart size and pulmonary vascularity are normal. Lungs are clear. No pleural effusions. No pneumothorax. Mediastinal contours appear intact. IMPRESSION: No active disease. Electronically Signed   By: Lucienne Capers M.D.   On: 08/09/2022 21:05   CT ANGIO HEAD NECK W WO CM  Result Date: 08/09/2022 CLINICAL DATA:  Follow-up examination for stroke. EXAM: CT ANGIOGRAPHY HEAD AND NECK TECHNIQUE: Multidetector CT imaging of the head and neck was performed using the standard protocol during bolus administration of intravenous contrast. Multiplanar CT image reconstructions and MIPs were obtained to evaluate the vascular anatomy. Carotid stenosis measurements (when applicable) are obtained utilizing NASCET criteria, using the distal internal carotid diameter as the denominator. RADIATION DOSE REDUCTION: This exam was performed according to the departmental dose-optimization program which includes automated exposure control, adjustment of the mA and/or kV according to patient size and/or use of iterative reconstruction technique. CONTRAST:  42m OMNIPAQUE IOHEXOL 350 MG/ML SOLN COMPARISON:  Prior study from 06/20/2022 FINDINGS: CT HEAD FINDINGS Brain: Age-related cerebral atrophy with mild chronic small vessel ischemic  disease. No acute intracranial hemorrhage. No acute large vessel territory infarct. No mass lesion or midline shift. Ventricular prominence related global parenchymal atrophy without hydrocephalus. No extra-axial fluid collection. Vascular: No hyperdense vessel. Calcified atherosclerosis present at skull base. Skull: Scalp soft tissues and  calvarium demonstrate no acute finding. Sinuses/Orbits: Globes orbital soft tissues within normal limits. Paranasal sinuses are clear. Other: No mastoid effusion. Review of the MIP images confirms the above findings CTA NECK FINDINGS Aortic arch: Partially visualized aortic arch within normal limits for caliber. Origin of the great vessels incompletely visualized on this exam. No visible stenosis. Right carotid system: Right common and internal carotid arteries patent without dissection or occlusion. Bulky calcified plaque about the right carotid bulb/proximal right ICA with associated stenosis of up to 65% by NASCET criteria. Left carotid system: Left common and internal carotid arteries patent without dissection or occlusion. Bulky calcified plaque about the left carotid bulb with associated stenosis of up to 50% by NASCET criteria. Vertebral arteries: Both vertebral arteries arise from the subclavian arteries. No significant proximal subclavian artery stenosis. Left vertebral artery slightly dominant. Vertebral arteries patent without stenosis, dissection or occlusion. Skeleton: No visible worrisome osseous lesions. Moderate to advanced multilevel cervical spondylosis, most pronounced at C6-7. Other neck: No other acute soft tissue abnormality within the neck. Upper chest: Visualized upper chest demonstrates no acute finding. Left-sided pacemaker/AICD noted. Review of the MIP images confirms the above findings CTA HEAD FINDINGS Anterior circulation: Moderate atheromatous plaque within the carotid siphons with no more than mild diffuse stenosis. A1 segments patent bilaterally.  Normal anterior communicating complex. Both anterior cerebral arteries widely patent. Normal in stenosis or occlusion. No proximal MCA branch occlusion or stenosis. Distal MCA branches perfused and symmetric. Posterior circulation: Both V4 segments patent without significant stenosis. Left PICA patent. Right PICA not seen. Basilar patent to its distal aspect without stenosis. Superior cerebellar arteries patent bilaterally. Both PCAs primarily supplied via the basilar well perfused or distal aspects. Small left posterior communicating artery noted. Venous sinuses: Patent allowing for timing the contrast bolus. Anatomic variants: None significant. Review of the MIP images confirms the above findings IMPRESSION: 1. Negative CTA for large vessel occlusion or other emergent finding. 2. Bulky calcified plaque about the carotid bifurcations bilaterally with associated stenoses of up to 65% on the right and 50% on the left. 3. No other hemodynamically significant or correctable stenosis about the major arterial vasculature of the head and neck. 4. No other acute intracranial abnormality. 5. Underlying age-related cerebral atrophy with chronic microvascular ischemic disease. Electronically Signed   By: Jeannine Boga M.D.   On: 08/09/2022 20:53   CT Cervical Spine Wo Contrast  Result Date: 08/09/2022 CLINICAL DATA:  Neck trauma, intoxicated or obtunded (Age >= 16y) EXAM: CT CERVICAL SPINE WITHOUT CONTRAST TECHNIQUE: Multidetector CT imaging of the cervical spine was performed without intravenous contrast. Multiplanar CT image reconstructions were also generated. RADIATION DOSE REDUCTION: This exam was performed according to the departmental dose-optimization program which includes automated exposure control, adjustment of the mA and/or kV according to patient size and/or use of iterative reconstruction technique. COMPARISON:  06/20/2022 FINDINGS: Alignment: Normal. Skull base and vertebrae: Craniocervical  alignment is normal. The atlantodental interval is not widened. No acute fracture of the cervical spine. Vertebral body height is preserved. Soft tissues and spinal canal: No prevertebral fluid or swelling. No visible canal hematoma. Advanced atherosclerotic calcification within the carotid bifurcations Disc levels: There is intervertebral disc space narrowing and endplate remodeling throughout the cervical spine, most severe at C5-T2 in keeping with changes of advanced degenerative disc disease. Prevertebral soft tissues are not thickened. Spinal canal is widely patent. Multilevel uncovertebral and facet arthrosis results in multilevel mild-to-moderate neuroforaminal narrowing, most severe on the right at C3-4 and C4-5  and on the left at C6-7. Upper chest: Negative. Other: None IMPRESSION: 1. No acute fracture or listhesis of the cervical spine. 2. Multilevel degenerative disc disease and facet arthrosis resulting in multilevel neuroforaminal narrowing as described above. 3. Advanced atherosclerotic calcification within the carotid bifurcations bilaterally. This is better assessed on accompanying CT arteriography Electronically Signed   By: Fidela Salisbury M.D.   On: 08/09/2022 20:44    Procedures Procedures    Medications Ordered in ED Medications  lactated ringers infusion ( Intravenous New Bag/Given 08/09/22 2135)  lactated ringers bolus 1,000 mL (1,000 mLs Intravenous New Bag/Given 08/09/22 2135)    And  lactated ringers bolus 500 mL (has no administration in time range)  metroNIDAZOLE (FLAGYL) IVPB 500 mg (500 mg Intravenous New Bag/Given 08/09/22 2212)  vancomycin (VANCOREADY) IVPB 1750 mg/350 mL (has no administration in time range)  ceFEPIme (MAXIPIME) 2 g in sodium chloride 0.9 % 100 mL IVPB (has no administration in time range)  vancomycin (VANCOREADY) IVPB 1500 mg/300 mL (has no administration in time range)  iohexol (OMNIPAQUE) 350 MG/ML injection 75 mL (75 mLs Intravenous Contrast Given  08/09/22 2028)  ceFEPIme (MAXIPIME) 2 g in sodium chloride 0.9 % 100 mL IVPB (0 g Intravenous Stopped 08/09/22 2212)    ED Course/ Medical Decision Making/ A&P                           Medical Decision Making Amount and/or Complexity of Data Reviewed External Data Reviewed: notes. Labs: ordered. Decision-making details documented in ED Course. Radiology: ordered and independent interpretation performed. Decision-making details documented in ED Course. ECG/medicine tests: ordered and independent interpretation performed. Decision-making details documented in ED Course.  Risk Prescription drug management. Decision regarding hospitalization.   Pt with multiple medical problems and comorbidities and presenting today with a complaint that caries a high risk for morbidity and mortality.  Here today after being found down by his son in the bathroom.  Unclear when this occurred.  Son last spoke to the patient at 5 PM yesterday and he was his normal self.  Patient has been minimally responsive but is awake.  Flaccid paralysis of the right upper extremity with multiple areas of pressure sores.  Patient does not take any anticoagulation.  He is also noted to be febrile at 100.6 with tachycardia and hypertension.  Concern for sepsis versus intracranial hemorrhage versus stroke.  He is LVO positive however last known normal was greater than 24 hours which does not make him a TNKase or code stroke candidate.  Also concern for electrolyte abnormality, rhabdomyolysis and kidney injury.  Labs are pending.  Patient given IV fluids.  Imaging pending.  10:15 PM Repeat temperature is 101.1 with persistent tachycardia.  Patient still will not open his eyes or respond.  With recurrent elevated temperature sepsis order set was initiated and he was covered with broad-spectrum antibiotics as source is unknown.  Patient was converted to an nasal cannula.  Patient's son reports his arm was draped over the toilet and his  leg was wrapped behind him.  I have independently visualized and interpreted pt's images today.  Head CT without intracranial hemorrhage, cervical spine without fractures.  Radiology reports degenerative disease in the neck but no acute findings.  No evidence of large vessel occlusion or stroke.  Concerned that patient not moving his arm may be related to radial nerve palsy given the large hematoma and way his arm was position for probably 12 hours  or more.  I independently interpreted patient's EKG and labs today.  UA with large hemoglobin but no significant evidence of infection, VBG within normal limits, CBC with a leukocytosis of 14 and stable hemoglobin, lactate of 4.2, coags within normal limits and CK is pending.  Interrogated patient's device and he had 1 episode of 20 seconds of SVT at 158 at 3 AM however did not feel that was the source of patient's fall.  Also son reports based on the way his house looked he most likely fell shortly after they spoke at 5 PM last night.  Feel that patient will need admission for further care.  10:15 PM Patient did briefly open his eyes when I went back in the room and reported he was okay.  CMP with an anion gap of 17, troponin of 83 and CK still in process.  Hospitalist consulted for admission.  CRITICAL CARE Performed by: Abriana Saltos Total critical care time: 30 minutes Critical care time was exclusive of separately billable procedures and treating other patients. Critical care was necessary to treat or prevent imminent or life-threatening deterioration. Critical care was time spent personally by me on the following activities: development of treatment plan with patient and/or surrogate as well as nursing, discussions with consultants, evaluation of patient's response to treatment, examination of patient, obtaining history from patient or surrogate, ordering and performing treatments and interventions, ordering and review of laboratory studies, ordering  and review of radiographic studies, pulse oximetry and re-evaluation of patient's condition.          Final Clinical Impression(s) / ED Diagnoses Final diagnoses:  Somnolence  Fall in home, initial encounter  AKI (acute kidney injury) ([place])  Sepsis without acute organ dysfunction, due to unspecified organism ([place])  Weakness of right upper extremity    Rx / DC Orders ED Discharge Orders     None         Blanchie Dessert, MD 08/09/22 2215

## 2022-08-09 NOTE — Progress Notes (Signed)
Pt being followed by [facility] for Sepsis protocol. 

## 2022-08-09 NOTE — H&P (Signed)
History and Physical    PLEASE NOTE THAT DRAGON DICTATION SOFTWARE WAS USED IN THE CONSTRUCTION OF THIS NOTE.   Joshua [name] MBT:[id number] DOB: [birth year] DOA: 08/09/2022  PCP: Shelda Pal, DO *** Patient coming from: home ***  I have personally briefly reviewed patient's old medical records in [place]  Chief Complaint: ***  HPI: Joshua [name] is a 86 y.o. male with medical history significant for *** who is admitted to [facility] on 08/09/2022 with *** after presenting from home*** to [facility] ED complaining of ***.    ***    ***SOB: Denies any associated orthopnea, PND, or new onset peripheral edema. No recent chest pain, diaphoresis, palpitations, N/V, pre-syncope, or syncope. Not associated with any recent cough, wheezing, hemoptysis, new lower extremity erythema, or calf tenderness. Denies any recent trauma, travel, surgical procedures, or periods of prolonged diminished ambulatory status. No recent melena or hematochezia.   Denies any associated subjective fever, chills, rigors, or generalized myalgias. No recent headache, neck stiffness, rhinitis, rhinorrhea, sore throat, abdominal pain, diarrhea, or rash. No known recent COVID-19 exposures. Denies dysuria, gross hematuria, or change in urinary urgency/frequency.  ***   ***misc/infectious: Denies any subjective fever, chills, rigors, or generalized myalgias. Denies any recent headache, neck stiffness, rhinitis, rhinorrhea, sore throat, sob, wheezing, cough, nausea, vomiting, abdominal pain, diarrhea, or rash. No recent traveling or known COVID-19 exposures. Denies dysuria, gross hematuria, or change in urinary urgency/frequency.  Denies any recent chest pain, diaphoresis, or palpitations. ***    ED Course:  Vital signs in the ED were notable for the following: ***  Labs were notable for the following: ***  Imaging and additional notable ED work-up: ***  While in the ED, the following  were administered: ***  Subsequently, the patient was admitted  ***  ***red    Review of Systems: As per HPI otherwise 10 point review of systems negative.   Past Medical History:  Diagnosis Date   Arthritis    Cataract    Esophageal stricture    GERD (gastroesophageal reflux disease)    Hyperlipidemia    Hypertension    Liver disease    hepatitis   Normocytic anemia 06/04/2022   Rhabdomyolysis 06/04/2022   Severe aortic stenosis     Past Surgical History:  Procedure Laterality Date   AORTIC VALVE REPLACEMENT N/A 11/04/2021   Procedure: AORTIC VALVE REPLACEMENT (AVR) USING 23 MM INSPIRIS RESILIA  AORTIC VALVE;  Surgeon: Lajuana Matte, MD;  Location: [place];  Service: Open Heart Surgery;  Laterality: N/A;   cataract  2013   CORONARY ARTERY BYPASS GRAFT N/A 11/04/2021   Procedure: CORONARY ARTERY BYPASS GRAFTING (CABG) X 3, USING LEFT INTERNAL MAMMARY ARTERY AND ENDOSCOPICALLY HARVESTED RIGHT GREATER SAPHENOUS VEIN. LIMA TO LAD, SVG TO PDA, SVG TO OM;  Surgeon: Lajuana Matte, MD;  Location: [place];  Service: Open Heart Surgery;  Laterality: N/A;   ENDOVEIN HARVEST OF GREATER SAPHENOUS VEIN Right 11/04/2021   Procedure: ENDOVEIN HARVEST OF GREATER SAPHENOUS VEIN;  Surgeon: Lajuana Matte, MD;  Location: [place];  Service: Open Heart Surgery;  Laterality: Right;   EXPLORATION POST OPERATIVE OPEN HEART N/A 11/05/2021   Procedure: EXPLORATION POST OPERATIVE OPEN HEARTFOR BLEEDING;  Surgeon: Melrose Nakayama, MD;  Location: [place];  Service: Open Heart Surgery;  Laterality: N/A;   HEMORROIDECTOMY  2001   HERNIA REPAIR  2005   INTRAVASCULAR PRESSURE WIRE/FFR STUDY N/A 11/03/2021   Procedure: INTRAVASCULAR PRESSURE WIRE/FFR STUDY;  Surgeon: Burnell Blanks, MD;  Location: [place] [facility];  Service: Cardiovascular;  Laterality: N/A;   PACEMAKER IMPLANT N/A 11/09/2021   Procedure: PACEMAKER IMPLANT;  Surgeon: Vickie Epley, MD;  Location: [place] [facility];   Service: Cardiovascular;  Laterality: N/A;   RIGHT/LEFT HEART CATH AND CORONARY ANGIOGRAPHY N/A 11/03/2021   Procedure: RIGHT/LEFT HEART CATH AND CORONARY ANGIOGRAPHY;  Surgeon: Burnell Blanks, MD;  Location: [place] [facility];  Service: Cardiovascular;  Laterality: N/A;   TEE WITHOUT CARDIOVERSION N/A 11/04/2021   Procedure: TRANSESOPHAGEAL ECHOCARDIOGRAM (TEE);  Surgeon: Lajuana Matte, MD;  Location: [place];  Service: Open Heart Surgery;  Laterality: N/A;   TEMPORARY PACEMAKER N/A 11/07/2021   Procedure: TEMPORARY PACEMAKER;  Surgeon: [name], MD;  Location: [place] [facility];  Service: Cardiovascular;  Laterality: N/A;   TONSILLECTOMY  1948    Social History:  reports that he quit smoking about 58 years ago. His smoking use included cigarettes. He has never used smokeless tobacco. He reports current alcohol use of about 21.0 standard drinks of alcohol per week. He reports that he does not use drugs.   No Known Allergies  Family History  Problem Relation Age of Onset   Heart failure Mother 9   Heart failure Father 62       Scarlet fever as child-Valve disease   Cancer Maternal Grandmother    Diabetes Child    Heart Problems Child 70       cardiac arrest    Family history reviewed and not pertinent ***   Prior to Admission medications   Medication Sig Start Date End Date Taking? Authorizing Provider  aspirin EC 81 MG tablet Take 1 tablet (81 mg total) by mouth daily. Swallow whole. 03/12/22   Croitoru, Mihai, MD  atorvastatin (LIPITOR) 40 MG tablet TAKE 1 TABLET EVERY DAY 07/13/22   Wendling, Crosby Oyster, DO  Glucosamine HCl (GLUCOSAMINE PO) Take 3,000 mg by mouth every other day.    [provider]  lisinopril (ZESTRIL) 20 MG tablet TAKE 1 TABLET EVERY DAY Patient taking differently: Take 20 mg by mouth daily. 03/02/22   Wendling, Crosby Oyster, DO  melatonin 5 MG TABS Take 1 tablet (5 mg total) by mouth at bedtime as needed. 12/15/21    Medina-Vargas, Monina C, NP  metoprolol tartrate (LOPRESSOR) 25 MG tablet TAKE 1/2 TABLET TWICE DAILY, HOLD FOR HEART RATE LESS THAN 60, SYSTOLIC BLOOD PRESSURE LESS THAN OR EQUAL TO 105 Patient taking differently: Take 12.5 mg by mouth 2 (two) times daily. 04/01/22   Shelda Pal, DO  omeprazole (PRILOSEC) 40 MG capsule TAKE 1 CAPSULE EVERY DAY Patient taking differently: Take 40 mg by mouth daily. 01/28/22   Shelda Pal, DO  vitamin B-12 (CYANOCOBALAMIN) 1000 MCG tablet Take 1,000 mcg by mouth daily.    [provider]     Objective    Physical Exam: Vitals:   08/09/22 2001 08/09/22 2106 08/09/22 2145 08/09/22 2200  BP:   (!) 143/81 (!) 137/97  Pulse:   (!) 110 (!) 107  Resp:   19 17  Temp: (!) 100.6 F (38.1 C) (!) 101.1 F (38.4 C)    TempSrc: Axillary Rectal    SpO2:   100% 99%  Weight:      Height:        General: appears to be stated age; alert, oriented Skin: warm, dry, no rash Head:  AT/[place] Mouth:  Oral mucosa membranes appear moist, normal dentition  Neck: supple; trachea midline Heart:  RRR; did not appreciate any M/R/G Lungs: CTAB, did not appreciate any wheezes, rales, or rhonchi Abdomen: + BS; soft, ND, NT Vascular: 2+ pedal pulses b/l; 2+ radial pulses b/l Extremities: no peripheral edema, no muscle wasting Neuro: strength and sensation intact in upper and lower extremities b/l ***   *** Neuro: 5/5 strength of the proximal and distal flexors and extensors of the upper and lower extremities bilaterally; sensation intact in upper and lower extremities b/l; cranial nerves II through XII grossly intact; no pronator drift; no evidence suggestive of slurred speech, dysarthria, or facial droop; Normal muscle tone. No tremors.  *** Neuro: In the setting of the patient's current mental status and associated inability to follow instructions, unable to perform full neurologic exam at this time.  As such, assessment of strength, sensation,  and cranial nerves is limited at this time. Patient noted to spontaneously move all 4 extremities. No tremors.  ***    Labs on Admission: I have personally reviewed following labs and imaging studies  CBC: Recent Labs  Lab 08/09/22 2004 08/09/22 2009  WBC 14.0*  --   NEUTROABS 12.5*  --   HGB 15.5 15.6  15.3  HCT 44.1 46.0  45.0  MCV 98.4  --   PLT 138*  --    Basic Metabolic Panel: Recent Labs  Lab 08/09/22 2004 08/09/22 2009  NA 137 135  135  K 5.0 5.1  5.1  CL 96* 100  CO2 24  --   GLUCOSE 172* 171*  BUN 19 22  CREATININE 1.22 1.00  CALCIUM 9.5  --    GFR: Estimated Creatinine Clearance: 59.6 mL/min (by C-G formula based on SCr of 1 mg/dL). Liver Function Tests: Recent Labs  Lab 08/09/22 2004  AST 132*  ALT 37  ALKPHOS 80  BILITOT 2.4*  PROT 8.0  ALBUMIN 4.3   No results for input(s): "LIPASE", "AMYLASE" in the last 168 hours. No results for input(s): "AMMONIA" in the last 168 hours. Coagulation Profile: Recent Labs  Lab 08/09/22 2004  INR 1.1   Cardiac Enzymes: Recent Labs  Lab 08/09/22 2004  CKTOTAL 6,868*   BNP (last 3 results) No results for input(s): "PROBNP" in the last 8760 hours. HbA1C: No results for input(s): "HGBA1C" in the last 72 hours. CBG: No results for input(s): "GLUCAP" in the last 168 hours. Lipid Profile: No results for input(s): "CHOL", "HDL", "LDLCALC", "TRIG", "CHOLHDL", "LDLDIRECT" in the last 72 hours. Thyroid Function Tests: No results for input(s): "TSH", "T4TOTAL", "FREET4", "T3FREE", "THYROIDAB" in the last 72 hours. Anemia Panel: No results for input(s): "VITAMINB12", "FOLATE", "FERRITIN", "TIBC", "IRON", "RETICCTPCT" in the last 72 hours. Urine analysis:    Component Value Date/Time   COLORURINE YELLOW 08/09/2022 2059   APPEARANCEUR CLEAR 08/09/2022 2059   LABSPEC >1.030 (H) 08/09/2022 2059   PHURINE 5.5 08/09/2022 2059   GLUCOSEU 100 (A) 08/09/2022 2059   HGBUR LARGE (A) 08/09/2022 2059    BILIRUBINUR SMALL (A) 08/09/2022 2059   KETONESUR 15 (A) 08/09/2022 2059   PROTEINUR 100 (A) 08/09/2022 2059   NITRITE NEGATIVE 08/09/2022 2059   LEUKOCYTESUR NEGATIVE 08/09/2022 2059    Radiological Exams on Admission: DG Shoulder Right Portable  Result Date: 08/09/2022 CLINICAL DATA:  Fall.  Altered mental status. EXAM: RIGHT SHOULDER - 1 VIEW COMPARISON:  None Available. FINDINGS: Degenerative changes in the acromioclavicular and glenohumeral joints. Calcification in the subacromial space consistent with calcific tendinosis. No evidence of acute fracture or dislocation. No focal  bone lesion or bone destruction. Vascular calcifications in the soft tissues. IMPRESSION: Degenerative changes in the right shoulder. Soft tissue calcifications suggesting calcific tendinosis. No acute bony abnormalities. Electronically Signed   By: Lucienne Capers M.D.   On: 08/09/2022 22:12   DG Pelvis Portable  Result Date: 08/09/2022 CLINICAL DATA:  Fall, altered mental status EXAM: PORTABLE PELVIS 1-2 VIEWS COMPARISON:  None Available. FINDINGS: Normal alignment. No acute fracture or dislocation. Sacroiliac and hip joint spaces are preserved. Advanced vascular calcifications are noted within the pelvis and medial thighs. IMPRESSION: No acute fracture or dislocation. Electronically Signed   By: Fidela Salisbury M.D.   On: 08/09/2022 21:06   DG Chest Port 1 View  Result Date: 08/09/2022 CLINICAL DATA:  Altered mental status. EXAM: PORTABLE CHEST 1 VIEW COMPARISON:  06/03/2022 FINDINGS: Cardiac pacemaker. Postoperative changes in the mediastinum. Shallow inspiration. Heart size and pulmonary vascularity are normal. Lungs are clear. No pleural effusions. No pneumothorax. Mediastinal contours appear intact. IMPRESSION: No active disease. Electronically Signed   By: Lucienne Capers M.D.   On: 08/09/2022 21:05   CT ANGIO HEAD NECK W WO CM  Result Date: 08/09/2022 CLINICAL DATA:  Follow-up examination for stroke.  EXAM: CT ANGIOGRAPHY HEAD AND NECK TECHNIQUE: Multidetector CT imaging of the head and neck was performed using the standard protocol during bolus administration of intravenous contrast. Multiplanar CT image reconstructions and MIPs were obtained to evaluate the vascular anatomy. Carotid stenosis measurements (when applicable) are obtained utilizing NASCET criteria, using the distal internal carotid diameter as the denominator. RADIATION DOSE REDUCTION: This exam was performed according to the departmental dose-optimization program which includes automated exposure control, adjustment of the mA and/or kV according to patient size and/or use of iterative reconstruction technique. CONTRAST:  46m OMNIPAQUE IOHEXOL 350 MG/ML SOLN COMPARISON:  Prior study from 06/20/2022 FINDINGS: CT HEAD FINDINGS Brain: Age-related cerebral atrophy with mild chronic small vessel ischemic disease. No acute intracranial hemorrhage. No acute large vessel territory infarct. No mass lesion or midline shift. Ventricular prominence related global parenchymal atrophy without hydrocephalus. No extra-axial fluid collection. Vascular: No hyperdense vessel. Calcified atherosclerosis present at skull base. Skull: Scalp soft tissues and calvarium demonstrate no acute finding. Sinuses/Orbits: Globes orbital soft tissues within normal limits. Paranasal sinuses are clear. Other: No mastoid effusion. Review of the MIP images confirms the above findings CTA NECK FINDINGS Aortic arch: Partially visualized aortic arch within normal limits for caliber. Origin of the great vessels incompletely visualized on this exam. No visible stenosis. Right carotid system: Right common and internal carotid arteries patent without dissection or occlusion. Bulky calcified plaque about the right carotid bulb/proximal right ICA with associated stenosis of up to 65% by NASCET criteria. Left carotid system: Left common and internal carotid arteries patent without dissection or  occlusion. Bulky calcified plaque about the left carotid bulb with associated stenosis of up to 50% by NASCET criteria. Vertebral arteries: Both vertebral arteries arise from the subclavian arteries. No significant proximal subclavian artery stenosis. Left vertebral artery slightly dominant. Vertebral arteries patent without stenosis, dissection or occlusion. Skeleton: No visible worrisome osseous lesions. Moderate to advanced multilevel cervical spondylosis, most pronounced at C6-7. Other neck: No other acute soft tissue abnormality within the neck. Upper chest: Visualized upper chest demonstrates no acute finding. Left-sided pacemaker/AICD noted. Review of the MIP images confirms the above findings CTA HEAD FINDINGS Anterior circulation: Moderate atheromatous plaque within the carotid siphons with no more than mild diffuse stenosis. A1 segments patent bilaterally. Normal anterior communicating complex. Both  anterior cerebral arteries widely patent. Normal in stenosis or occlusion. No proximal MCA branch occlusion or stenosis. Distal MCA branches perfused and symmetric. Posterior circulation: Both V4 segments patent without significant stenosis. Left PICA patent. Right PICA not seen. Basilar patent to its distal aspect without stenosis. Superior cerebellar arteries patent bilaterally. Both PCAs primarily supplied via the basilar well perfused or distal aspects. Small left posterior communicating artery noted. Venous sinuses: Patent allowing for timing the contrast bolus. Anatomic variants: None significant. Review of the MIP images confirms the above findings IMPRESSION: 1. Negative CTA for large vessel occlusion or other emergent finding. 2. Bulky calcified plaque about the carotid bifurcations bilaterally with associated stenoses of up to 65% on the right and 50% on the left. 3. No other hemodynamically significant or correctable stenosis about the major arterial vasculature of the head and neck. 4. No other  acute intracranial abnormality. 5. Underlying age-related cerebral atrophy with chronic microvascular ischemic disease. Electronically Signed   By: Jeannine Boga M.D.   On: 08/09/2022 20:53   CT Cervical Spine Wo Contrast  Result Date: 08/09/2022 CLINICAL DATA:  Neck trauma, intoxicated or obtunded (Age >= 16y) EXAM: CT CERVICAL SPINE WITHOUT CONTRAST TECHNIQUE: Multidetector CT imaging of the cervical spine was performed without intravenous contrast. Multiplanar CT image reconstructions were also generated. RADIATION DOSE REDUCTION: This exam was performed according to the departmental dose-optimization program which includes automated exposure control, adjustment of the mA and/or kV according to patient size and/or use of iterative reconstruction technique. COMPARISON:  06/20/2022 FINDINGS: Alignment: Normal. Skull base and vertebrae: Craniocervical alignment is normal. The atlantodental interval is not widened. No acute fracture of the cervical spine. Vertebral body height is preserved. Soft tissues and spinal canal: No prevertebral fluid or swelling. No visible canal hematoma. Advanced atherosclerotic calcification within the carotid bifurcations Disc levels: There is intervertebral disc space narrowing and endplate remodeling throughout the cervical spine, most severe at C5-T2 in keeping with changes of advanced degenerative disc disease. Prevertebral soft tissues are not thickened. Spinal canal is widely patent. Multilevel uncovertebral and facet arthrosis results in multilevel mild-to-moderate neuroforaminal narrowing, most severe on the right at C3-4 and C4-5 and on the left at C6-7. Upper chest: Negative. Other: None IMPRESSION: 1. No acute fracture or listhesis of the cervical spine. 2. Multilevel degenerative disc disease and facet arthrosis resulting in multilevel neuroforaminal narrowing as described above. 3. Advanced atherosclerotic calcification within the carotid bifurcations  bilaterally. This is better assessed on accompanying CT arteriography Electronically Signed   By: Fidela Salisbury M.D.   On: 08/09/2022 20:44     EKG: Independently reviewed, with result as described above. ***   Assessment/Plan   Active Problems:   Acute metabolic encephalopathy   ***       ***            ***             ***            ***            ***            ***           ***   ***  DVT prophylaxis: SCD's ***  Code Status: Full code*** Family Communication: none*** Disposition Plan: Per Rounding Team Consults called: none***;  Admission status: ***    PLEASE NOTE THAT DRAGON DICTATION SOFTWARE WAS USED IN THE CONSTRUCTION OF THIS NOTE.   [place]  [facility]  From [place]   08/09/2022, 10:48 PM   ***

## 2022-08-10 ENCOUNTER — Encounter (HOSPITAL_COMMUNITY): Payer: Self-pay | Admitting: Internal Medicine

## 2022-08-10 ENCOUNTER — Inpatient Hospital Stay (HOSPITAL_COMMUNITY): Payer: Medicare HMO

## 2022-08-10 ENCOUNTER — Other Ambulatory Visit: Payer: Self-pay

## 2022-08-10 DIAGNOSIS — R778 Other specified abnormalities of plasma proteins: Secondary | ICD-10-CM

## 2022-08-10 DIAGNOSIS — I10 Essential (primary) hypertension: Secondary | ICD-10-CM

## 2022-08-10 DIAGNOSIS — G9341 Metabolic encephalopathy: Secondary | ICD-10-CM | POA: Diagnosis not present

## 2022-08-10 DIAGNOSIS — R6510 Systemic inflammatory response syndrome (SIRS) of non-infectious origin without acute organ dysfunction: Secondary | ICD-10-CM | POA: Diagnosis present

## 2022-08-10 DIAGNOSIS — R7401 Elevation of levels of liver transaminase levels: Secondary | ICD-10-CM | POA: Diagnosis present

## 2022-08-10 DIAGNOSIS — R29898 Other symptoms and signs involving the musculoskeletal system: Secondary | ICD-10-CM

## 2022-08-10 DIAGNOSIS — N179 Acute kidney failure, unspecified: Secondary | ICD-10-CM

## 2022-08-10 DIAGNOSIS — R7989 Other specified abnormal findings of blood chemistry: Secondary | ICD-10-CM | POA: Diagnosis present

## 2022-08-10 DIAGNOSIS — E872 Acidosis, unspecified: Secondary | ICD-10-CM | POA: Diagnosis present

## 2022-08-10 DIAGNOSIS — E860 Dehydration: Secondary | ICD-10-CM | POA: Diagnosis present

## 2022-08-10 HISTORY — DX: Acute kidney failure, unspecified: N17.9

## 2022-08-10 LAB — PROTIME-INR
INR: 1.2 (ref 0.8–1.2)
Prothrombin Time: 14.6 seconds (ref 11.4–15.2)

## 2022-08-10 LAB — COMPREHENSIVE METABOLIC PANEL
ALT: 47 U/L — ABNORMAL HIGH (ref 0–44)
AST: 182 U/L — ABNORMAL HIGH (ref 15–41)
Albumin: 3.1 g/dL — ABNORMAL LOW (ref 3.5–5.0)
Alkaline Phosphatase: 51 U/L (ref 38–126)
Anion gap: 10 (ref 5–15)
BUN: 18 mg/dL (ref 8–23)
CO2: 23 mmol/L (ref 22–32)
Calcium: 8.3 mg/dL — ABNORMAL LOW (ref 8.9–10.3)
Chloride: 103 mmol/L (ref 98–111)
Creatinine, Ser: 1.04 mg/dL (ref 0.61–1.24)
GFR, Estimated: >60 mL/min (ref >60)
Glucose, Bld: 148 mg/dL — ABNORMAL HIGH (ref 70–99)
Potassium: 4.4 mmol/L (ref 3.5–5.1)
Sodium: 136 mmol/L (ref 135–145)
Total Bilirubin: 2.5 mg/dL — ABNORMAL HIGH (ref 0.3–1.2)
Total Protein: 5.7 g/dL — ABNORMAL LOW (ref 6.5–8.1)

## 2022-08-10 LAB — ECHOCARDIOGRAM COMPLETE
AR max vel: 1.27 cm2
AV Area VTI: 1.38 cm2
AV Area mean vel: 1.27 cm2
AV Mean grad: 23 mmHg
AV Peak grad: 42.5 mmHg
Ao pk vel: 3.26 m/s
Area-P 1/2: 2.91 cm2
Height: 71 in
S' Lateral: 2.6 cm
Weight: 2836 oz

## 2022-08-10 LAB — URINE CULTURE: Culture: NO GROWTH

## 2022-08-10 LAB — TROPONIN I (HIGH SENSITIVITY)
Troponin I (High Sensitivity): 146 ng/L (ref <18)
Troponin I (High Sensitivity): 81 ng/L — ABNORMAL HIGH (ref <18)

## 2022-08-10 LAB — CBC WITH DIFFERENTIAL/PLATELET
Abs Immature Granulocytes: 0.11 10*3/uL — ABNORMAL HIGH (ref 0.00–0.07)
Basophils Absolute: 0 10*3/uL (ref 0.0–0.1)
Basophils Relative: 0 %
Eosinophils Absolute: 0 10*3/uL (ref 0.0–0.5)
Eosinophils Relative: 0 %
HCT: 35.7 % — ABNORMAL LOW (ref 39.0–52.0)
Hemoglobin: 12 g/dL — ABNORMAL LOW (ref 13.0–17.0)
Immature Granulocytes: 1 %
Lymphocytes Relative: 6 %
Lymphs Abs: 0.7 10*3/uL (ref 0.7–4.0)
MCH: 33.9 pg (ref 26.0–34.0)
MCHC: 33.6 g/dL (ref 30.0–36.0)
MCV: 100.8 fL — ABNORMAL HIGH (ref 80.0–100.0)
Monocytes Absolute: 1 10*3/uL (ref 0.1–1.0)
Monocytes Relative: 9 %
Neutro Abs: 9.3 10*3/uL — ABNORMAL HIGH (ref 1.7–7.7)
Neutrophils Relative %: 84 %
Platelets: 114 10*3/uL — ABNORMAL LOW (ref 150–400)
RBC: 3.54 MIL/uL — ABNORMAL LOW (ref 4.22–5.81)
RDW: 12.4 % (ref 11.5–15.5)
WBC: 11.1 10*3/uL — ABNORMAL HIGH (ref 4.0–10.5)
nRBC: 0 % (ref 0.0–0.2)

## 2022-08-10 LAB — MAGNESIUM
Magnesium: 1.7 mg/dL (ref 1.7–2.4)
Magnesium: 1.6 mg/dL — ABNORMAL LOW (ref 1.7–2.4)

## 2022-08-10 LAB — IRON AND TIBC
Iron: 43 ug/dL — ABNORMAL LOW (ref 45–182)
Saturation Ratios: 16 % — ABNORMAL LOW (ref 17.9–39.5)
TIBC: 266 ug/dL (ref 250–450)
UIBC: 223 ug/dL

## 2022-08-10 LAB — FERRITIN: Ferritin: 87 ng/mL (ref 24–336)

## 2022-08-10 LAB — AMMONIA: Ammonia: 13 umol/L (ref 9–35)

## 2022-08-10 LAB — LACTIC ACID, PLASMA
Lactic Acid, Venous: 2 mmol/L (ref 0.5–1.9)
Lactic Acid, Venous: 2.5 mmol/L (ref 0.5–1.9)

## 2022-08-10 LAB — CK
Total CK: 9203 U/L — ABNORMAL HIGH (ref 49–397)
Total CK: 8205 U/L — ABNORMAL HIGH (ref 49–397)

## 2022-08-10 LAB — SALICYLATE LEVEL: Salicylate Lvl: <7 mg/dL — ABNORMAL LOW (ref 7.0–30.0)

## 2022-08-10 LAB — PROCALCITONIN: Procalcitonin: <0.1 ng/mL

## 2022-08-10 LAB — BILIRUBIN, DIRECT: Bilirubin, Direct: 0.4 mg/dL — ABNORMAL HIGH (ref 0.0–0.2)

## 2022-08-10 LAB — PHOSPHORUS: Phosphorus: 3.8 mg/dL (ref 2.5–4.6)

## 2022-08-10 LAB — ETHANOL: Alcohol, Ethyl (B): <10 mg/dL (ref <10)

## 2022-08-10 LAB — TSH: TSH: 1.029 u[IU]/mL (ref 0.350–4.500)

## 2022-08-10 LAB — BRAIN NATRIURETIC PEPTIDE: B Natriuretic Peptide: 204.3 pg/mL — ABNORMAL HIGH (ref 0.0–100.0)

## 2022-08-10 MED ORDER — METOPROLOL TARTRATE 12.5 MG HALF TABLET
12.5000 mg | ORAL_TABLET | Freq: Two times a day (BID) | ORAL | Status: DC
  Administered 2022-08-10 – 2022-08-15 (×11): 12.5 mg via ORAL
  Filled 2022-08-10 (×11): qty 1

## 2022-08-10 MED ORDER — MAGNESIUM SULFATE 2 GM/50ML IV SOLN
2.0000 g | Freq: Once | INTRAVENOUS | Status: AC
  Administered 2022-08-10: 2 g via INTRAVENOUS
  Filled 2022-08-10: qty 50

## 2022-08-10 MED ORDER — PERFLUTREN LIPID MICROSPHERE
1.0000 mL | INTRAVENOUS | Status: AC | PRN
  Administered 2022-08-10: 2 mL via INTRAVENOUS

## 2022-08-10 MED ORDER — ENOXAPARIN SODIUM 40 MG/0.4ML IJ SOSY
40.0000 mg | PREFILLED_SYRINGE | INTRAMUSCULAR | Status: DC
  Administered 2022-08-10 – 2022-08-15 (×6): 40 mg via SUBCUTANEOUS
  Filled 2022-08-10 (×6): qty 0.4

## 2022-08-10 MED ORDER — LACTATED RINGERS IV SOLN: INTRAVENOUS | Status: AC

## 2022-08-10 NOTE — ED Notes (Signed)
Ortho at bedside.

## 2022-08-10 NOTE — Evaluation (Signed)
Physical Therapy Evaluation Patient Details Name: Joshua [name] MRN: [id number] DOB: 1936-10-08 Today's Date: 08/10/2022  History of Present Illness  Pt is an 86 y/o male who presented after a fall at home (on ground for approx 24 hr) and AMS in setting of acute metabolic encephalopathy and rhabdomyolysis. PMH: CAD, CABG (2023), aortic stenosis, PPM (2023), HTN, HLD  Clinical Impression  Pt admitted with above diagnosis. At baseline, pt is independent, ambulatory, and cognitively intact (involved in stock markets).  Today, pt had attempted mobility with OT earlier and was max to total x 2 to transition to EOB with difficulty motor planning and often doing opposite of what was asked.  Pt demonstrating similar responses with PT with bed mobility and LE testing, so did not attempt OOB (did not have +2 help, no change, still in ED on stretcher).  He had no obvious strength deficits in bil LE but did have difficulty with motor planning.  Pt also reports some vision changes that fluctuated during therapy (initially double vision, then field cuts, then no change but perseverating on answers) -testing was inconsistent and complaints more consistent with confusion.  Hopeful that pt will progress well as confusion clears and family looking into assist at [place] , so did place on for 3 x week therapy in hopes of progression. Pt currently with functional limitations due to the deficits listed below (see PT Problem List). Pt will benefit from skilled PT to increase their independence and safety with mobility to allow discharge to the venue listed below.          Recommendations for follow up therapy are one component of a multi-disciplinary discharge planning process, led by the attending physician.  Recommendations may be updated based on patient status, additional functional criteria and insurance authorization.  Follow Up Recommendations Skilled nursing-short term rehab (<3 hours/day) Can patient physically be  transported by private vehicle: No    Assistance Recommended at Discharge Frequent or constant Supervision/Assistance  Patient can return home with the following  Two people to help with bathing/dressing/bathroom;Two people to help with walking and/or transfers    Equipment Recommendations Other (comment) (defer to post acute)  Recommendations for Other Services       Functional Status Assessment Patient has had a recent decline in their functional status and demonstrates the ability to make significant improvements in function in a reasonable and predictable amount of time.     Precautions / Restrictions Precautions Precautions: Fall;Other (comment) Required Braces or Orthoses: Other Brace Other Brace: R resting hand splint Restrictions Weight Bearing Restrictions: No      Mobility  Bed Mobility Overal bed mobility: Needs Assistance             General bed mobility comments: Noted max to total x 2 with OT earlier with poor motor planing doing the opposite of what was instructed.  Did not reattempt as no change, still in ED on stretcher, and pt doing the opposite of what was asked (for heel slide asked to bed knee, slide heel toward bottom, knee to chest but he kept extending hard and resisting the motion)    Transfers                   General transfer comment: unable    Ambulation/Gait                  Stairs            Wheelchair Mobility    Modified Rankin (  Stroke Patients Only)       Balance Overall balance assessment: Needs assistance     Sitting balance - Comments: Poor with OT earlier       Standing balance comment: unable                             Pertinent Vitals/Pain Pain Assessment Pain Assessment: No/denies pain    Home Living Family/patient expects to be discharged to:: Private residence Living Arrangements: Alone Available Help at Discharge: Family;Available PRN/intermittently Type of Home:  House Home Access: Level entry       Home Layout: One level Home Equipment: Rollator (4 wheels);Cane - single point;Toilet riser;Grab bars - toilet;Grab bars - tub/shower;Hand held shower head;Wheelchair - Multimedia programmer (2 wheels) Additional Comments: Supposed to move into [facility] this week    Prior Function Prior Level of Function : Independent/Modified Independent;Driving;History of Falls (last six months)             Mobility Comments: RW in the house, cane outside in community ADLs Comments: Driving, grocery shopping, med mgmt, involved in stock market     Hand Dominance   Dominant Hand: Right    Extremity/Trunk Assessment   Upper Extremity Assessment Upper Extremity Assessment: Defer to OT evaluation RUE Deficits / Details: R UE in resting splint RUE Sensation: decreased light touch;decreased proprioception RUE Coordination: decreased fine motor;decreased gross motor    Lower Extremity Assessment Lower Extremity Assessment: LLE deficits/detail;RLE deficits/detail RLE Deficits / Details: ROM WFL; Difficulty with commands for MMT but appears strong/no obvious deficits.  Increased time, cues, demonstration and pt still had difficulty with heel slide motions - kept straightening knee and hip LLE Deficits / Details: ROM WFL; Difficulty with commands for MMT but appears strong/no obvious deficits.  Increased time, cues, demonstration and pt still had difficulty with heel slide motions - kept straightening knee and hip    Cervical / Trunk Assessment Cervical / Trunk Assessment: Normal  Communication   Communication: No difficulties  Cognition Arousal/Alertness: Awake/alert Behavior During Therapy: Anxious Overall Cognitive Status: Impaired/Different from baseline Area of Impairment: Orientation, Attention, Memory, Following commands, Safety/judgement, Awareness, Problem solving                 Orientation Level: Disoriented to,  Situation Current Attention Level: Sustained Memory: Decreased short-term memory Following Commands: Follows one step commands inconsistently Safety/Judgement: Decreased awareness of safety, Decreased awareness of deficits Awareness: Intellectual Problem Solving: Slow processing, Requires verbal cues, Decreased initiation, Difficulty sequencing, Requires tactile cues General Comments: Delayed motor planning and sometimes difficulty coordinating movements.  Had c/o vision deficits (see comments) but began to perserverate on task during vision test and becoming somewhat anxious/agitiated stating "i need you to accept my answer."  Tried to redirect but continued to perserverate.        General Comments General comments (skin integrity, edema, etc.): No family present. Pt initially answering PT questions (oriented to self, place, and states here b/c he fell). He then started reporting double vision (seeing 8 therapist) that just started. Tested smooth pursuit with coordinated movements, did note horizontal nystagmus end range. Pt then c/o not being able to see on L side/only seeing half of therapist. Tested visual fields but pt having some difficulty with commands. With testing finger movement coming around head decreased on L compared to R, tested with finger movements in peripheral vision and pt able to identify but inconsistent, drew shapes and held in different  fields of vision and pt able to identify, but then began perserverating on a red triangle that therapist drew (had drawn a black triangle) and kept saying "I most definitely saw a red triangle in a black square." Had to redirect pt. Test of skew negative; head thrust negative    Exercises     Assessment/Plan    PT Assessment Patient needs continued PT services  PT Problem List Decreased strength;Decreased mobility;Decreased safety awareness;Decreased range of motion;Decreased coordination;Decreased knowledge of precautions;Decreased  activity tolerance;Decreased cognition;Decreased balance;Decreased knowledge of use of DME;Pain       PT Treatment Interventions DME instruction;Therapeutic activities;Gait training;Therapeutic exercise;Patient/family education;Stair training;Balance training;Functional mobility training;Neuromuscular re-education    PT Goals (Current goals can be found in the Care Plan section)  Acute Rehab PT Goals Patient Stated Goal: unable to state; noted in OT note that family checking with ILF to see about level of assist they can provide PT Goal Formulation: With patient Time For Goal Achievement: 08/24/22 Potential to Achieve Goals: Good    Frequency Min 3X/week     Co-evaluation               AM-PAC PT "6 Clicks" Mobility  Outcome Measure Help needed turning from your back to your side while in a flat bed without using bedrails?: Total Help needed moving from lying on your back to sitting on the side of a flat bed without using bedrails?: Total Help needed moving to and from a bed to a chair (including a wheelchair)?: Total Help needed standing up from a chair using your arms (e.g., wheelchair or bedside chair)?: Total Help needed to walk in hospital room?: Total Help needed climbing 3-5 steps with a railing? : Total 6 Click Score: 6    End of Session Equipment Utilized During Treatment: Gait belt Activity Tolerance: Patient tolerated treatment well Patient left: in bed;with call bell/phone within reach Nurse Communication: Mobility status PT Visit Diagnosis: Other abnormalities of gait and mobility (R26.89);Muscle weakness (generalized) (M62.81)    Time: 1405-1430 PT Time Calculation (min) (ACUTE ONLY): 25 min   Charges:   PT Evaluation $PT Eval Moderate Complexity: 1 Mod PT Treatments $Therapeutic Activity: 8-22 mins        Abran Richard, PT Acute Rehab [facility] [phone]   Karlton Lemon 08/10/2022, 2:46 PM

## 2022-08-10 NOTE — Progress Notes (Signed)
ANTICOAGULATION CONSULT NOTE - Initial Consult  Pharmacy Consult for Lovenox  Indication: VTE prophylaxis  No Known Allergies  Patient Measurements: Height: '5\' 11"'$  (180.3 cm) Weight: 80.4 kg (177 lb 4 oz) IBW/kg (Calculated) : 75.3  Vital Signs: Temp: 99.1 F (37.3 C) (10/23 0735) Temp Source: Oral (10/23 0735) BP: 107/65 (10/23 0735) Pulse Rate: 97 (10/23 0735)  Labs: Recent Labs    08/09/22 2004 08/09/22 2009 08/09/22 2230 08/10/22 0400  HGB 15.5 15.6  15.3  --  12.0*  HCT 44.1 46.0  45.0  --  35.7*  PLT 138*  --   --  114*  APTT 28  --   --   --   LABPROT 14.0  --   --  14.6  INR 1.1  --   --  1.2  CREATININE 1.22 1.00  --  1.04  CKTOTAL 6,868*  --   --  9,203*  TROPONINIHS 83*  --  146*  --     Estimated Creatinine Clearance: 57.3 mL/min (by C-G formula based on SCr of 1.04 mg/dL).   Medical History: Past Medical History:  Diagnosis Date   AKI (acute kidney injury) ([place]) 08/10/2022   Arthritis    Cataract    Esophageal stricture    GERD (gastroesophageal reflux disease)    Hyperlipidemia    Hypertension    Liver disease    hepatitis   Normocytic anemia 06/04/2022   Pacemaker    Rhabdomyolysis 06/04/2022   Severe aortic stenosis    Assessment: Consulted for lovenox dosing for DVT prophylaxis. Not on anticoagulation prior to admission. Hgb and renal function stable.    Plan:  Lovenox '40mg'$  every 24 hours.   Ventura Sellers 08/10/2022,9:12 AM

## 2022-08-10 NOTE — Evaluation (Addendum)
Occupational Therapy Evaluation Patient Details Name: Joshua [name] MRN: [id number] DOB: April 14, 1936 Today's Date: 08/10/2022   History of Present Illness Pt is an 86 y/o male who presented after a fall at home (on ground for approx 24 hr) and AMS in setting of acute metabolic encephalopathy. PMH: CAD, CABG (2023), aortic stenosis, PPM (2023), HTN, HLD   Clinical Impression   PTA, pt lives alone, typically Modified Independent in all ADLs, IADLs, driving, med mgmt and mobility using cane vs RW. Pt presents now with significant deficits in cognition, R UE strength/sensation, and balance. Pt with impaired motor planning impacting adequate command following. Overall, pt requires Max-Total A x 2 for bed mobility to sit on EOB. Due to anterior lean sitting up, unable to safely attempt standing. Pt requires Max A for UB ADL and Total A for LB ADLs bed level. Based on R UE impaired proprioception and strength, consulted MD with plans to trial resting hand splint to protect R wrist until strength returns. Pt's family present and supportive. Educated re: ROM exercise for RUE, optimal positioning of R UE to prevent shoulder sublux, and promoting proper sleep/wake cycle. Based on current presentation, rec SNF rehab. However, family plans to discuss with pt's pending ILF to see what kind of care they can get for him when he moves there. Will follow acutely.      Recommendations for follow up therapy are one component of a multi-disciplinary discharge planning process, led by the attending physician.  Recommendations may be updated based on patient status, additional functional criteria and insurance authorization.   Follow Up Recommendations  Skilled nursing-short term rehab (<3 hours/day)    Assistance Recommended at Discharge Frequent or constant Supervision/Assistance  Patient can return home with the following Two people to help with walking and/or transfers;Two people to help with  bathing/dressing/bathroom    Functional Status Assessment  Patient has had a recent decline in their functional status and demonstrates the ability to make significant improvements in function in a reasonable and predictable amount of time.  Equipment Recommendations  None recommended by OT    Recommendations for Other Services       Precautions / Restrictions Precautions Precautions: Fall;Other (comment) Required Braces or Orthoses: Other Brace Other Brace: Consulted with Attending, placed order for R resting hand splint 10/23 Restrictions Weight Bearing Restrictions: No      Mobility Bed Mobility Overal bed mobility: Needs Assistance Bed Mobility: Supine to Sit, Sit to Supine     Supine to sit: Max assist, +2 for physical assistance, +2 for safety/equipment Sit to supine: Total assist, +2 for physical assistance, +2 for safety/equipment   General bed mobility comments: Poor motor planning, noted to complete opposite movement of what was instructed. Anterior lean sititng EOB, so unable to stand and returned to supine on stretcher    Transfers                   General transfer comment: unable      Balance Overall balance assessment: Needs assistance Sitting-balance support: Single extremity supported, Feet supported Sitting balance-Leahy Scale: Poor   Postural control: Other (comment) (anterior lean)                                 ADL either performed or assessed with clinical judgement   ADL Overall ADL's : Needs assistance/impaired Eating/Feeding: Moderate assistance;Bed level   Grooming: Moderate assistance;Bed level   Upper Body  Bathing: Maximal assistance;Bed level   Lower Body Bathing: Total assistance;Bed level   Upper Body Dressing : Maximal assistance;Bed level   Lower Body Dressing: Total assistance;Bed level       Toileting- Clothing Manipulation and Hygiene: Total assistance;Bed level         General ADL Comments:  Impaired cognition, poor motor planning and resistant to movements at times. Pt with impaired R UE function requiring extensive assist for basic tasks     Vision Baseline Vision/History: 1 Wears glasses Ability to See in Adequate Light: 1 Impaired Patient Visual Report: No change from baseline Vision Assessment?: No apparent visual deficits     Perception     Praxis      Pertinent Vitals/Pain Pain Assessment Pain Assessment: No/denies pain     Hand Dominance Right   Extremity/Trunk Assessment Upper Extremity Assessment Upper Extremity Assessment: RUE deficits/detail RUE Deficits / Details: trace muscle activation in forearm, wrist, and hand. PROM WFL. fair elbow extension with cues though unable to actively bend elbow, lift UE or make fist. based on poor wrist ROM and impaired sensation to protect UE, placed order for resting hand splint after discussing with MD RUE Sensation: decreased light touch;decreased proprioception RUE Coordination: decreased fine motor;decreased gross motor   Lower Extremity Assessment Lower Extremity Assessment: Defer to PT evaluation   Cervical / Trunk Assessment Cervical / Trunk Assessment: Normal   Communication Communication Communication: Expressive difficulties   Cognition Arousal/Alertness: Awake/alert Behavior During Therapy: Flat affect, Impulsive Overall Cognitive Status: Impaired/Different from baseline Area of Impairment: Orientation, Attention, Memory, Following commands, Safety/judgement, Awareness, Problem solving                 Orientation Level: Disoriented to, Place, Time, Situation Current Attention Level: Sustained Memory: Decreased short-term memory Following Commands: Follows one step commands inconsistently, Follows one step commands with increased time Safety/Judgement: Decreased awareness of safety, Decreased awareness of deficits Awareness: Intellectual Problem Solving: Slow processing, Requires verbal cues,  Decreased initiation, Difficulty sequencing, Requires tactile cues General Comments: very delayed motor planning, requires reorientation throughout the day per nursing. Pt required multimodal cues to complete tasks, inconsistent following of commands.     General Comments  Son and DIL present, hands on to assist.    Exercises     Shoulder Instructions      Home Living Family/patient expects to be discharged to:: Private residence Living Arrangements: Alone Available Help at Discharge: Family;Available PRN/intermittently Type of Home: House Home Access: Level entry     Home Layout: One level     Bathroom Shower/Tub: Occupational psychologist: Handicapped height Bathroom Accessibility: Yes How Accessible: Accessible via walker Home Equipment: Rollator (4 wheels);Cane - single point;Toilet riser;Grab bars - toilet;Grab bars - tub/shower;Hand held shower head;Wheelchair - Multimedia programmer (2 wheels)   Additional Comments: Supposed to move into [facility] this week      Prior Functioning/Environment Prior Level of Function : Independent/Modified Independent;Driving;History of Falls (last six months)             Mobility Comments: RW in the house, cane outside in community ADLs Comments: Driving, grocery shopping, med mgmt, involved in stock market        OT Problem List: Decreased strength;Decreased activity tolerance;Impaired balance (sitting and/or standing);Decreased cognition;Decreased safety awareness;Decreased knowledge of use of DME or AE;Impaired UE functional use;Impaired tone;Impaired sensation;Decreased coordination      OT Treatment/Interventions: Self-care/ADL training;Therapeutic exercise;Energy conservation;DME and/or AE instruction;Therapeutic activities;Patient/family education;Manual therapy    OT Goals(Current  goals can be found in the care plan section) Acute Rehab OT Goals Patient Stated Goal: son hopeful for pt to move  to ILF and participate in therapy there OT Goal Formulation: With patient/family Time For Goal Achievement: 08/24/22 Potential to Achieve Goals: Good ADL Goals Pt Will Perform Lower Body Bathing: with min assist;sit to/from stand Pt Will Perform Lower Body Dressing: with min assist;sit to/from stand;sitting/lateral leans Pt Will Transfer to Toilet: with mod assist;stand pivot transfer;bedside commode Pt/caregiver will Perform Home Exercise Program: Increased strength;Right Upper extremity;With Supervision;With written HEP provided Additional ADL Goal #1: Pt to tolerate R resting hand splint wear > 4 hours without signs of skin breakdown in order to protect R wrist  OT Frequency: Min 2X/week    Co-evaluation              AM-PAC OT "6 Clicks" Daily Activity     Outcome Measure Help from another person eating meals?: A Little Help from another person taking care of personal grooming?: A Little Help from another person toileting, which includes using toliet, bedpan, or urinal?: Total Help from another person bathing (including washing, rinsing, drying)?: A Lot Help from another person to put on and taking off regular upper body clothing?: A Lot Help from another person to put on and taking off regular lower body clothing?: Total 6 Click Score: 12   End of Session Nurse Communication: Mobility status  Activity Tolerance: Other (comment) (limited by cognition) Patient left: in bed;with call bell/phone within reach;with family/visitor present  OT Visit Diagnosis: Unsteadiness on feet (R26.81);Other abnormalities of gait and mobility (R26.89);Muscle weakness (generalized) (M62.81);Other symptoms and signs involving cognitive function                Time: 6294-7654 OT Time Calculation (min): 27 min Charges:  OT General Charges $OT Visit: 1 Visit OT Evaluation $OT Eval Moderate Complexity: 1 Mod OT Treatments $Therapeutic Activity: 8-22 mins  Malachy Chamber, OTR/L Acute Rehab  Services Office: [phone]   Layla Maw 08/10/2022, 12:39 PM

## 2022-08-10 NOTE — Progress Notes (Signed)
PROGRESS NOTE    Joshua [name]  TFT:[id number] DOB: October 30, 1935 DOA: 08/09/2022 PCP: Shelda Pal, DO   Brief Narrative:  Joshua [name] is a 86 y.o. male with medical history significant for coronary disease status post three-vessel CABG in January 2023, severe aortic stenosis status post aortic valve replacement in January 2023 status post pacemaker placement, hypertension, hyperlipidemia, who is admitted to [facility] on 08/09/2022 with suspected acute metabolic encephalopathy in the setting of rhabdomyolysis after presenting from home to [facility] ED altered mental status.  Assessment & Plan:   Principal Problem:   Acute metabolic encephalopathy Active Problems:   Rhabdomyolysis   Essential hypertension   Hypercholesterolemia   SIRS (systemic inflammatory response syndrome) (HCC)   Lactic acidosis   Weakness of right upper extremity   Elevated troponin   AKI (acute kidney injury) ([place])   Transaminitis   Dehydration  Acute metabolic encephalopathy: CT head unremarkable.  Ammonia normal.  Likely in the setting of some sort of infection or rhabdomyolysis.  Currently fully alert and oriented.  Son and daughter-in-law at the bedside.  Rhabdomyolysis: For some reason, patient's CK is worsening.  We will continue IV fluids.  Monitor CK and repeat labs later and in the morning.  dehydration/lactic acidosis: Lactic acidosis improving.  Continue IV fluids.  Repeat labs later today.  Fever with SIRS criteria: 1-1.3 at Tmax yesterday.  No signs of infection.  UA and chest x-ray negative.  Has been afebrile.  On antibiotic, cefepime and vancomycin, which I will continue for now and follow cultures.  Procalcitonin pending.  Elevated troponin: Jumped from 83 yesterday to 146 today.  Patient asymptomatic.  Echo pending.  Hypomagnesemia: Replace.  Elevated LFTs: Wonder if this is likely due to shock, rhabdomyolysis as well.  Monitor labs every day.  Hyperlipidemia: On  atorvastatin PTA but holding that due to active rhabdomyolysis.  Essential hypertension: Blood pressure controlled but on the low normal side.  Continue home dose of Lopressor but hold lisinopril.  Right-sided wrist drop and right upper extremity weakness: Likely due to radial nerve palsy as he was found to have his right arm dropped over the commode.  It is being presumed that he was like that for 24 hours.  He is going to need extended therapy.  PT OT consulted.  Chronic thrombocytopenia: Stable.  DVT prophylaxis: enoxaparin (LOVENOX) injection 40 mg Start: 08/10/22 0915 SCDs Start: 08/09/22 2244   Code Status: Full Code  Family Communication: Son and daughter-in-law present at bedside.  Plan of care discussed with patient in length and he/she verbalized understanding and agreed with it.  Status is: Inpatient Remains inpatient appropriate because: Still very weak with rhabdomyolysis.   Estimated body mass index is 24.72 kg/m as calculated from the following:   Height as of this encounter: '5\' 11"'$  (1.803 m).   Weight as of this encounter: 80.4 kg.    Nutritional Assessment: Body mass index is 24.72 kg/m.Marland Kitchen Seen by dietician.  I agree with the assessment and plan as outlined below: Nutrition Status:        . Skin Assessment: I have examined the patient's skin and I agree with the wound assessment as performed by the wound care RN as outlined below:    Consultants:  None  Procedures:  None  Antimicrobials:  Anti-infectives (From admission, onward)    Start     Dose/Rate Route Frequency Ordered Stop   08/10/22 2200  vancomycin (VANCOREADY) IVPB 1500 mg/300 mL  1,500 mg 150 mL/hr over 120 Minutes Intravenous Every 24 hours 08/09/22 2153 08/16/22 2159   08/10/22 1000  ceFEPIme (MAXIPIME) 2 g in sodium chloride 0.9 % 100 mL IVPB        2 g 200 mL/hr over 30 Minutes Intravenous Every 12 hours 08/09/22 2152 08/17/22 0959   08/09/22 2130  ceFEPIme (MAXIPIME) 2 g in  sodium chloride 0.9 % 100 mL IVPB        2 g 200 mL/hr over 30 Minutes Intravenous  Once 08/09/22 2120 08/09/22 2212   08/09/22 2130  metroNIDAZOLE (FLAGYL) IVPB 500 mg        500 mg 100 mL/hr over 60 Minutes Intravenous  Once 08/09/22 2120 08/09/22 2328   08/09/22 2130  vancomycin (VANCOCIN) IVPB 1000 mg/200 mL premix  Status:  Discontinued        1,000 mg 200 mL/hr over 60 Minutes Intravenous  Once 08/09/22 2120 08/09/22 2123   08/09/22 2130  vancomycin (VANCOREADY) IVPB 1750 mg/350 mL        1,750 mg 175 mL/hr over 120 Minutes Intravenous  Once 08/09/22 2123 08/10/22 0132         Subjective: Patient seen and examined.  Son and daughter-in-law at the bedside.  Patient is fully alert and oriented.  He has no complaints other than weakness in the right upper extremity.  Objective: Vitals:   08/10/22 0735 08/10/22 0924 08/10/22 1030 08/10/22 1041  BP: 107/65 115/64 121/67   Pulse: 97 98 76   Resp: 20  (!) 21   Temp: 99.1 F (37.3 C)   98.6 F (37 C)  TempSrc: Oral   Oral  SpO2: 99%  98%   Weight:      Height:       No intake or output data in the 24 hours ending 08/10/22 1101 Filed Weights   08/09/22 1956  Weight: 80.4 kg    Examination:  General exam: Appears calm and comfortable  Respiratory system: Clear to auscultation. Respiratory effort normal. Cardiovascular system: S1 & S2 heard, RRR. No JVD, murmurs, rubs, gallops or clicks. No pedal edema. Gastrointestinal system: Abdomen is nondistended, soft and nontender. No organomegaly or masses felt. Normal bowel sounds heard. Central nervous system: Alert and oriented.  Wrist drop and weakness in the right upper extremity. Extremities: Symmetric 5 x 5 power. Skin: No rashes, lesions or ulcers Psychiatry: Judgement and insight appear normal. Mood & affect appropriate.    Data Reviewed: I have personally reviewed following labs and imaging studies  CBC: Recent Labs  Lab 08/09/22 2004 08/09/22 2009 08/10/22 0400   WBC 14.0*  --  11.1*  NEUTROABS 12.5*  --  9.3*  HGB 15.5 15.6  15.3 12.0*  HCT 44.1 46.0  45.0 35.7*  MCV 98.4  --  100.8*  PLT 138*  --  169*   Basic Metabolic Panel: Recent Labs  Lab 08/09/22 2004 08/09/22 2009 08/09/22 2230 08/10/22 0400  NA 137 135  135  --  136  K 5.0 5.1  5.1  --  4.4  CL 96* 100  --  103  CO2 24  --   --  23  GLUCOSE 172* 171*  --  148*  BUN 19 22  --  18  CREATININE 1.22 1.00  --  1.04  CALCIUM 9.5  --   --  8.3*  MG  --   --  1.7 1.6*  PHOS  --   --   --  3.8   GFR:  Estimated Creatinine Clearance: 57.3 mL/min (by C-G formula based on SCr of 1.04 mg/dL). Liver Function Tests: Recent Labs  Lab 08/09/22 2004 08/10/22 0400  AST 132* 182*  ALT 37 47*  ALKPHOS 80 51  BILITOT 2.4* 2.5*  PROT 8.0 5.7*  ALBUMIN 4.3 3.1*   No results for input(s): "LIPASE", "AMYLASE" in the last 168 hours. Recent Labs  Lab 08/10/22 0400  AMMONIA 13   Coagulation Profile: Recent Labs  Lab 08/09/22 2004 08/10/22 0400  INR 1.1 1.2   Cardiac Enzymes: Recent Labs  Lab 08/09/22 2004 08/10/22 0400  CKTOTAL 6,868* 9,203*   BNP (last 3 results) No results for input(s): "PROBNP" in the last 8760 hours. HbA1C: No results for input(s): "HGBA1C" in the last 72 hours. CBG: No results for input(s): "GLUCAP" in the last 168 hours. Lipid Profile: No results for input(s): "CHOL", "HDL", "LDLCALC", "TRIG", "CHOLHDL", "LDLDIRECT" in the last 72 hours. Thyroid Function Tests: Recent Labs    08/10/22 0400  TSH 1.029   Anemia Panel: Recent Labs    08/10/22 0400  FERRITIN 87  TIBC 266  IRON 43*   Sepsis Labs: Recent Labs  Lab 08/09/22 2004 08/09/22 2230 08/10/22 0400  PROCALCITON  --   --  <0.10  LATICACIDVEN 4.2* 3.7* 2.0*    Recent Results (from the past 240 hour(s))  Blood Culture (routine x 2)     Status: None (Preliminary result)   Collection Time: 08/09/22  9:00 PM   Specimen: BLOOD  Result Value Ref Range Status   Specimen  Description BLOOD LEFT ARM  Final   Special Requests   Final    BOTTLES DRAWN AEROBIC AND ANAEROBIC Blood Culture results may not be optimal due to an excessive volume of blood received in culture bottles   Culture   Final    NO GROWTH < 12 HOURS Performed at [facility] [address] [place], [place] [address]    Report Status PENDING  Incomplete  Blood Culture (routine x 2)     Status: None (Preliminary result)   Collection Time: 08/09/22  9:10 PM   Specimen: BLOOD RIGHT ARM  Result Value Ref Range Status   Specimen Description BLOOD RIGHT ARM  Final   Special Requests   Final    BOTTLES DRAWN AEROBIC AND ANAEROBIC Blood Culture adequate volume   Culture   Final    NO GROWTH < 12 HOURS Performed at [facility], [place] [address] [place], [place] [address]    Report Status PENDING  Incomplete  SARS Coronavirus 2 by RT PCR (hospital order, performed in [facility] hospital lab) *cepheid single result test* Anterior Nasal Swab     Status: None   Collection Time: 08/09/22 10:30 PM   Specimen: Anterior Nasal Swab  Result Value Ref Range Status   SARS Coronavirus 2 by RT PCR NEGATIVE NEGATIVE Final    Comment: (NOTE) SARS-CoV-2 target nucleic acids are NOT DETECTED.  The SARS-CoV-2 RNA is generally detectable in upper and lower respiratory specimens during the acute phase of infection. The lowest concentration of SARS-CoV-2 viral copies this assay can detect is 250 copies / mL. A negative result does not preclude SARS-CoV-2 infection and should not be used as the sole basis for treatment or other patient management decisions.  A negative result may occur with improper specimen collection / handling, submission of specimen other than nasopharyngeal swab, presence of viral mutation(s) within the areas targeted by this assay, and inadequate number of viral  copies (<250 copies / mL). A negative result must be combined with clinical observations, patient history, and  epidemiological information.  Fact Sheet for Patients:   https://www.patel.info/  Fact Sheet for Healthcare Providers: https://hall.com/  This test is not yet approved or  cleared by the [place] FDA and has been authorized for detection and/or diagnosis of SARS-CoV-2 by FDA under an Emergency Use Authorization (EUA).  This EUA will remain in effect (meaning this test can be used) for the duration of the COVID-19 declaration under Section 564(b)(1) of the Act, 21 U.S.C. section 360bbb-3(b)(1), unless the authorization is terminated or revoked sooner.  Performed at [facility], [place] [address], [place], [place] [address]      Radiology Studies: DG Shoulder Right Portable  Result Date: 08/09/2022 CLINICAL DATA:  Fall.  Altered mental status. EXAM: RIGHT SHOULDER - 1 VIEW COMPARISON:  None Available. FINDINGS: Degenerative changes in the acromioclavicular and glenohumeral joints. Calcification in the subacromial space consistent with calcific tendinosis. No evidence of acute fracture or dislocation. No focal bone lesion or bone destruction. Vascular calcifications in the soft tissues. IMPRESSION: Degenerative changes in the right shoulder. Soft tissue calcifications suggesting calcific tendinosis. No acute bony abnormalities. Electronically Signed   By: Lucienne Capers M.D.   On: 08/09/2022 22:12   DG Pelvis Portable  Result Date: 08/09/2022 CLINICAL DATA:  Fall, altered mental status EXAM: PORTABLE PELVIS 1-2 VIEWS COMPARISON:  None Available. FINDINGS: Normal alignment. No acute fracture or dislocation. Sacroiliac and hip joint spaces are preserved. Advanced vascular calcifications are noted within the pelvis and medial thighs. IMPRESSION: No acute fracture or dislocation. Electronically Signed   By: Fidela Salisbury M.D.   On: 08/09/2022 21:06   DG Chest Port 1 View  Result Date: 08/09/2022 CLINICAL DATA:  Altered mental status.  EXAM: PORTABLE CHEST 1 VIEW COMPARISON:  06/03/2022 FINDINGS: Cardiac pacemaker. Postoperative changes in the mediastinum. Shallow inspiration. Heart size and pulmonary vascularity are normal. Lungs are clear. No pleural effusions. No pneumothorax. Mediastinal contours appear intact. IMPRESSION: No active disease. Electronically Signed   By: Lucienne Capers M.D.   On: 08/09/2022 21:05   CT ANGIO HEAD NECK W WO CM  Result Date: 08/09/2022 CLINICAL DATA:  Follow-up examination for stroke. EXAM: CT ANGIOGRAPHY HEAD AND NECK TECHNIQUE: Multidetector CT imaging of the head and neck was performed using the standard protocol during bolus administration of intravenous contrast. Multiplanar CT image reconstructions and MIPs were obtained to evaluate the vascular anatomy. Carotid stenosis measurements (when applicable) are obtained utilizing NASCET criteria, using the distal internal carotid diameter as the denominator. RADIATION DOSE REDUCTION: This exam was performed according to the departmental dose-optimization program which includes automated exposure control, adjustment of the mA and/or kV according to patient size and/or use of iterative reconstruction technique. CONTRAST:  51m OMNIPAQUE IOHEXOL 350 MG/ML SOLN COMPARISON:  Prior study from 06/20/2022 FINDINGS: CT HEAD FINDINGS Brain: Age-related cerebral atrophy with mild chronic small vessel ischemic disease. No acute intracranial hemorrhage. No acute large vessel territory infarct. No mass lesion or midline shift. Ventricular prominence related global parenchymal atrophy without hydrocephalus. No extra-axial fluid collection. Vascular: No hyperdense vessel. Calcified atherosclerosis present at skull base. Skull: Scalp soft tissues and calvarium demonstrate no acute finding. Sinuses/Orbits: Globes orbital soft tissues within normal limits. Paranasal sinuses are clear. Other: No mastoid effusion. Review of the MIP images confirms the above findings CTA NECK  FINDINGS Aortic arch: Partially visualized aortic arch within normal limits for caliber. Origin of the great  vessels incompletely visualized on this exam. No visible stenosis. Right carotid system: Right common and internal carotid arteries patent without dissection or occlusion. Bulky calcified plaque about the right carotid bulb/proximal right ICA with associated stenosis of up to 65% by NASCET criteria. Left carotid system: Left common and internal carotid arteries patent without dissection or occlusion. Bulky calcified plaque about the left carotid bulb with associated stenosis of up to 50% by NASCET criteria. Vertebral arteries: Both vertebral arteries arise from the subclavian arteries. No significant proximal subclavian artery stenosis. Left vertebral artery slightly dominant. Vertebral arteries patent without stenosis, dissection or occlusion. Skeleton: No visible worrisome osseous lesions. Moderate to advanced multilevel cervical spondylosis, most pronounced at C6-7. Other neck: No other acute soft tissue abnormality within the neck. Upper chest: Visualized upper chest demonstrates no acute finding. Left-sided pacemaker/AICD noted. Review of the MIP images confirms the above findings CTA HEAD FINDINGS Anterior circulation: Moderate atheromatous plaque within the carotid siphons with no more than mild diffuse stenosis. A1 segments patent bilaterally. Normal anterior communicating complex. Both anterior cerebral arteries widely patent. Normal in stenosis or occlusion. No proximal MCA branch occlusion or stenosis. Distal MCA branches perfused and symmetric. Posterior circulation: Both V4 segments patent without significant stenosis. Left PICA patent. Right PICA not seen. Basilar patent to its distal aspect without stenosis. Superior cerebellar arteries patent bilaterally. Both PCAs primarily supplied via the basilar well perfused or distal aspects. Small left posterior communicating artery noted. Venous  sinuses: Patent allowing for timing the contrast bolus. Anatomic variants: None significant. Review of the MIP images confirms the above findings IMPRESSION: 1. Negative CTA for large vessel occlusion or other emergent finding. 2. Bulky calcified plaque about the carotid bifurcations bilaterally with associated stenoses of up to 65% on the right and 50% on the left. 3. No other hemodynamically significant or correctable stenosis about the major arterial vasculature of the head and neck. 4. No other acute intracranial abnormality. 5. Underlying age-related cerebral atrophy with chronic microvascular ischemic disease. Electronically Signed   By: Jeannine Boga M.D.   On: 08/09/2022 20:53   CT Cervical Spine Wo Contrast  Result Date: 08/09/2022 CLINICAL DATA:  Neck trauma, intoxicated or obtunded (Age >= 16y) EXAM: CT CERVICAL SPINE WITHOUT CONTRAST TECHNIQUE: Multidetector CT imaging of the cervical spine was performed without intravenous contrast. Multiplanar CT image reconstructions were also generated. RADIATION DOSE REDUCTION: This exam was performed according to the departmental dose-optimization program which includes automated exposure control, adjustment of the mA and/or kV according to patient size and/or use of iterative reconstruction technique. COMPARISON:  06/20/2022 FINDINGS: Alignment: Normal. Skull base and vertebrae: Craniocervical alignment is normal. The atlantodental interval is not widened. No acute fracture of the cervical spine. Vertebral body height is preserved. Soft tissues and spinal canal: No prevertebral fluid or swelling. No visible canal hematoma. Advanced atherosclerotic calcification within the carotid bifurcations Disc levels: There is intervertebral disc space narrowing and endplate remodeling throughout the cervical spine, most severe at C5-T2 in keeping with changes of advanced degenerative disc disease. Prevertebral soft tissues are not thickened. Spinal canal is widely  patent. Multilevel uncovertebral and facet arthrosis results in multilevel mild-to-moderate neuroforaminal narrowing, most severe on the right at C3-4 and C4-5 and on the left at C6-7. Upper chest: Negative. Other: None IMPRESSION: 1. No acute fracture or listhesis of the cervical spine. 2. Multilevel degenerative disc disease and facet arthrosis resulting in multilevel neuroforaminal narrowing as described above. 3. Advanced atherosclerotic calcification within the carotid bifurcations bilaterally. This  is better assessed on accompanying CT arteriography Electronically Signed   By: Fidela Salisbury M.D.   On: 08/09/2022 20:44    Scheduled Meds:  enoxaparin (LOVENOX) injection  40 mg Subcutaneous Q24H   metoprolol tartrate  12.5 mg Oral BID   Continuous Infusions:  ceFEPime (MAXIPIME) IV Stopped (08/10/22 1032)   lactated ringers 150 mL/hr at 08/10/22 0548   vancomycin       LOS: 1 day   Darliss Cheney, MD [facility]  08/10/2022, 11:01 AM   *Please note that this is a verbal dictation therefore any spelling or grammatical errors are due to the "[place]" system interpretation.  Please page via [employer] and do not message via secure chat for urgent patient care matters. Secure chat can be used for non urgent patient care matters.  How to contact the [facility] Attending or Consulting provider [place] or covering provider during after hours [facility], for this patient?  Check the care team in [facility] and look for a) attending/consulting [facility] provider listed and b) the [facility] team listed. Page or secure chat [phone]. Log into [email or web address] and use [place]'s universal password to access. If you do not have the password, please contact the hospital operator. Locate the [facility] provider you are looking for under [facility] and page to a number that you can be directly reached. If you still have difficulty reaching the provider, please page the [facility] (Director on Call) for the Hospitalists listed on  amion for assistance.

## 2022-08-10 NOTE — Progress Notes (Signed)
Orthopedic Tech Progress Note Patient Details:  Joshua [name] [birth year] [id number]  Called in order to [employer] for a RESTING HAND SPLINT   Patient ID: [name], male   DOB: [birth year], 86 y.o.   MRN: [id number]  [name] 08/10/2022, 12:45 PM

## 2022-08-11 ENCOUNTER — Inpatient Hospital Stay (HOSPITAL_COMMUNITY): Payer: Medicare HMO

## 2022-08-11 DIAGNOSIS — G9341 Metabolic encephalopathy: Secondary | ICD-10-CM | POA: Diagnosis not present

## 2022-08-11 LAB — COMPREHENSIVE METABOLIC PANEL
ALT: 57 U/L — ABNORMAL HIGH (ref 0–44)
AST: 200 U/L — ABNORMAL HIGH (ref 15–41)
Albumin: 2.8 g/dL — ABNORMAL LOW (ref 3.5–5.0)
Alkaline Phosphatase: 48 U/L (ref 38–126)
Anion gap: 10 (ref 5–15)
BUN: 26 mg/dL — ABNORMAL HIGH (ref 8–23)
CO2: 23 mmol/L (ref 22–32)
Calcium: 8.3 mg/dL — ABNORMAL LOW (ref 8.9–10.3)
Chloride: 103 mmol/L (ref 98–111)
Creatinine, Ser: 0.99 mg/dL (ref 0.61–1.24)
GFR, Estimated: >60 mL/min (ref >60)
Glucose, Bld: 108 mg/dL — ABNORMAL HIGH (ref 70–99)
Potassium: 3.8 mmol/L (ref 3.5–5.1)
Sodium: 136 mmol/L (ref 135–145)
Total Bilirubin: 1.4 mg/dL — ABNORMAL HIGH (ref 0.3–1.2)
Total Protein: 5.4 g/dL — ABNORMAL LOW (ref 6.5–8.1)

## 2022-08-11 LAB — CBC WITH DIFFERENTIAL/PLATELET
Abs Immature Granulocytes: 0.04 10*3/uL (ref 0.00–0.07)
Basophils Absolute: 0 10*3/uL (ref 0.0–0.1)
Basophils Relative: 0 %
Eosinophils Absolute: 0 10*3/uL (ref 0.0–0.5)
Eosinophils Relative: 0 %
HCT: 29.4 % — ABNORMAL LOW (ref 39.0–52.0)
Hemoglobin: 10 g/dL — ABNORMAL LOW (ref 13.0–17.0)
Immature Granulocytes: 0 %
Lymphocytes Relative: 11 %
Lymphs Abs: 1.1 10*3/uL (ref 0.7–4.0)
MCH: 33.9 pg (ref 26.0–34.0)
MCHC: 34 g/dL (ref 30.0–36.0)
MCV: 99.7 fL (ref 80.0–100.0)
Monocytes Absolute: 1 10*3/uL (ref 0.1–1.0)
Monocytes Relative: 10 %
Neutro Abs: 7.6 10*3/uL (ref 1.7–7.7)
Neutrophils Relative %: 79 %
Platelets: 91 10*3/uL — ABNORMAL LOW (ref 150–400)
RBC: 2.95 MIL/uL — ABNORMAL LOW (ref 4.22–5.81)
RDW: 12.4 % (ref 11.5–15.5)
WBC: 9.8 10*3/uL (ref 4.0–10.5)
nRBC: 0 % (ref 0.0–0.2)

## 2022-08-11 LAB — CK: Total CK: 6408 U/L — ABNORMAL HIGH (ref 49–397)

## 2022-08-11 MED ORDER — PANTOPRAZOLE SODIUM 40 MG PO TBEC
40.0000 mg | DELAYED_RELEASE_TABLET | Freq: Every day | ORAL | Status: DC
  Administered 2022-08-12 – 2022-08-15 (×4): 40 mg via ORAL
  Filled 2022-08-11 (×4): qty 1

## 2022-08-11 NOTE — Progress Notes (Signed)
Pt hallucinating - stated he is seeing letters on the ceiling tiles - pt stated he sees the letters KISS and now can only see X's on ceiling tiles.  A&Ox4.  Pt also stating it "is really weird seeing X's on the ceiling".  MD notified.

## 2022-08-11 NOTE — Progress Notes (Signed)
  Transition of Care [facility]) Screening Note   Patient Details  Name: Joshua [name] Date of Birth: [birth year]   Transition of Care [facility]) CM/SW Contact:    Geralynn Ochs, LCSW Phone Number: 08/11/2022, 2:03 PM    Transition of Care Department [facility]) has reviewed patient, noting consult for CIR to review. We will continue to monitor patient advancement through interdisciplinary progression rounds. If new patient transition needs arise, please place a TOC consult.

## 2022-08-11 NOTE — Progress Notes (Signed)
Occupational Therapy Treatment Patient Details Name: Joshua [name] MRN: [id number] DOB: 07-12-1936 Today's Date: 08/11/2022   History of present illness Pt is an 86 y/o male who presented after a fall at home (on ground for approx 24 hr) and AMS in setting of acute metabolic encephalopathy and rhabdomyolysis. PMH: CAD, CABG (2023), aortic stenosis, PPM (2023), HTN, HLD   OT comments  Pt making significant progress in comparison to initial OT eval with movement at all R UE joints, improving cognition (motor planning deficits remain), and progression OOB. Overall, pt requires Mod-Max A x 2 for initial transfer with RW (may benefit from platform walker trial) to use of Stedy due to fatigue and motor planning deficits. At end of session, pt left in chair with R UE propped up and resting hand splint off. Updated orders to include resting hand splint wear at night only to continue protecting wrist joint while allowing functional use of R dominant UE during the day. Based on progress and rehab potential, updated DC recs to [facility].   Recommendations for follow up therapy are one component of a multi-disciplinary discharge planning process, led by the attending physician.  Recommendations may be updated based on patient status, additional functional criteria and insurance authorization.    Follow Up Recommendations  Acute inpatient rehab (3hours/day)    Assistance Recommended at Discharge Frequent or constant Supervision/Assistance  Patient can return home with the following  Two people to help with walking and/or transfers;Two people to help with bathing/dressing/bathroom   Equipment Recommendations  None recommended by OT    Recommendations for Other Services Rehab consult    Precautions / Restrictions Precautions Precautions: Fall;Other (comment) Required Braces or Orthoses: Other Brace Other Brace: R resting hand splint Restrictions Weight Bearing Restrictions: No       Mobility Bed  Mobility Overal bed mobility: Needs Assistance Bed Mobility: Supine to Sit     Supine to sit: Max assist, HOB elevated     General bed mobility comments: Pt able to initiate moving BLE to EOB but requires max assist to upright trunk & fully scoot to sitting EOB    Transfers Overall transfer level: Needs assistance Equipment used: Rolling walker (2 wheels) Transfers: Sit to/from Stand, Bed to chair/wheelchair/BSC Sit to Stand: Max assist, +2 physical assistance     Step pivot transfers: Max assist, +2 physical assistance, +2 safety/equipment     General transfer comment: STS from EOB with RW: Pt requires assistance for placing RUE on RW & cuing to grasp, pt with difficulty maintaining grasp on RW 2/2 decreased elbow/shoulder strength. Pt requires cuing for sequencing, to push to standing, pt able to complete STS with max assist +2. Pt completes step pivot bed>recliner on R with max assist +2 with PT assisting with maintaining R grasp on RW, assistance for maneuvering RW & cuing for stepping. Pt requires cuing & extra time to advance feet with pt demonstrating difficulty doing so. Pt attempted additional STS from recliner but unable even with max assist 2/2 fatigue. Utilized stedy lift for recliner<>BSC with min/mod assist +2 to complete STS with stedy device, max assist to shift pelvis anteriorly to come to full upright standing to allow therapists to place stedy lift seat.     Balance Overall balance assessment: Needs assistance Sitting-balance support: Feet supported, Bilateral upper extremity supported Sitting balance-Leahy Scale: Fair     Standing balance support: During functional activity, Bilateral upper extremity supported, Reliant on assistive device for balance Standing balance-Leahy Scale: Poor  ADL either performed or assessed with clinical judgement   ADL Overall ADL's : Needs assistance/impaired                          Toilet Transfer: Maximal assistance;+2 for physical assistance;+2 for safety/equipment;BSC/3in1 Toilet Transfer Details (indicate cue type and reason): use of Stedy, Mod-Max A x 2; increased assist needed with fatigue                Extremity/Trunk Assessment Upper Extremity Assessment Upper Extremity Assessment: RUE deficits/detail RUE Deficits / Details: R UE with movement in all joints now though weakness still noted. Removed splint and left off to encourage fucntional movement of UE, propped on pillow for shoulder support RUE Sensation: decreased light touch;decreased proprioception RUE Coordination: decreased fine motor;decreased gross motor   Lower Extremity Assessment Lower Extremity Assessment: Defer to PT evaluation        Vision   Vision Assessment?: No apparent visual deficits   Perception     Praxis      Cognition Arousal/Alertness: Awake/alert Behavior During Therapy: Flat affect Overall Cognitive Status: Impaired/Different from baseline Area of Impairment: Orientation, Attention, Memory, Following commands, Safety/judgement, Awareness, Problem solving                 Orientation Level: Disoriented to, Situation   Memory: Decreased short-term memory Following Commands: Follows one step commands inconsistently Safety/Judgement: Decreased awareness of safety, Decreased awareness of deficits Awareness: Intellectual Problem Solving: Slow processing, Requires verbal cues, Decreased initiation, Difficulty sequencing, Requires tactile cues General Comments: Poor motor planning, able to answer Oct 2023, following commands a little better today though worsens with fatigue. Perseveration on where his watch is        Exercises      Shoulder Instructions       General Comments Son entering during session    Pertinent Vitals/ Pain       Pain Assessment Pain Assessment: No/denies pain  Home Living                                           Prior Functioning/Environment              Frequency  Min 2X/week        Progress Toward Goals  OT Goals(current goals can now be found in the care plan section)  Progress towards OT goals: Progressing toward goals  Acute Rehab OT Goals Patient Stated Goal: be able to go to ILF OT Goal Formulation: With patient/family Time For Goal Achievement: 08/24/22 Potential to Achieve Goals: Good ADL Goals Pt Will Perform Lower Body Bathing: with min assist;sit to/from stand Pt Will Perform Lower Body Dressing: with min assist;sit to/from stand;sitting/lateral leans Pt Will Transfer to Toilet: with mod assist;stand pivot transfer;bedside commode Pt/caregiver will Perform Home Exercise Program: Increased strength;Right Upper extremity;With Supervision;With written HEP provided Additional ADL Goal #1: Pt to tolerate R resting hand splint wear > 4 hours without signs of skin breakdown in order to protect R wrist  Plan Discharge plan needs to be updated    Co-evaluation    PT/OT/SLP Co-Evaluation/Treatment: Yes Reason for Co-Treatment: For patient/therapist safety;To address functional/ADL transfers PT goals addressed during session: Mobility/safety with mobility;Proper use of DME;Balance OT goals addressed during session: ADL's and self-care;Strengthening/ROM      AM-PAC OT "6 Clicks" Daily Activity     Outcome  Measure   Help from another person eating meals?: A Little Help from another person taking care of personal grooming?: A Little Help from another person toileting, which includes using toliet, bedpan, or urinal?: Total Help from another person bathing (including washing, rinsing, drying)?: A Lot Help from another person to put on and taking off regular upper body clothing?: A Lot Help from another person to put on and taking off regular lower body clothing?: Total 6 Click Score: 12    End of Session Equipment Utilized During Treatment: Rolling walker (2  wheels);Gait belt  OT Visit Diagnosis: Unsteadiness on feet (R26.81);Other abnormalities of gait and mobility (R26.89);Muscle weakness (generalized) (M62.81);Other symptoms and signs involving cognitive function   Activity Tolerance Patient tolerated treatment well;Patient limited by fatigue   Patient Left in chair;with call bell/phone within reach;with chair alarm set;with family/visitor present   Nurse Communication Mobility status;Other (comment) (splinting)        Time: 3845-3646 OT Time Calculation (min): 41 min  Charges: OT General Charges $OT Visit: 1 Visit OT Treatments $Self Care/Home Management : 8-22 mins $Therapeutic Activity: 8-22 mins  Malachy Chamber, OTR/L Acute Rehab Services Office: [phone]   Layla Maw 08/11/2022, 10:44 AM

## 2022-08-11 NOTE — Progress Notes (Addendum)
PROGRESS NOTE    Joshua [name]  VOH:[id number] DOB: 09/12/1936 DOA: 08/09/2022 PCP: Shelda Pal, DO   Brief Narrative:  Joshua [name] is a 86 y.o. male with medical history significant for coronary disease status post three-vessel CABG in January 2023, severe aortic stenosis status post aortic valve replacement in January 2023 status post pacemaker placement, hypertension, hyperlipidemia, who is admitted to [facility] on 08/09/2022 with suspected acute metabolic encephalopathy in the setting of rhabdomyolysis after presenting from home to [facility] ED altered mental status.  Assessment & Plan:   Principal Problem:   Acute metabolic encephalopathy Active Problems:   Rhabdomyolysis   Essential hypertension   Hypercholesterolemia   SIRS (systemic inflammatory response syndrome) (HCC)   Lactic acidosis   Weakness of right upper extremity   Elevated troponin   AKI (acute kidney injury) ([place])   Transaminitis   Dehydration  Acute metabolic encephalopathy: CT head unremarkable.  Ammonia normal.  Likely in the setting of some sort of infection or rhabdomyolysis.  Currently fully alert and oriented.  Son and daughter-in-law at the bedside.  Rhabdomyolysis: CK improving.  Patient is improving as well.  We will continue IV fluids but reduce rate to 100 cc/h now that patient is more alert and we are hopeful that he will have better p.o. intake.  dehydration/lactic acidosis: Lactic acidosis improving.  Continue IV fluids.  Repeat labs later today.  Fever with SIRS criteria: 101.3 at Tmax upon admission but has remained afebrile since then and no clear signs of infection with UA and chest x-ray negative as well as blood cultures have been negative for 48 hours and procalcitonin unremarkable as well, so at this point in time, my suspicion for any sort of infection is very low and I feel confident with the decision to discontinue antibiotics and monitoring off of antibiotics.    Elevated troponin: Jumped from 83 yesterday to 146 and then down to 81 again.  Patient asymptomatic.  Echo shows normal ejection fraction with no wall motion abnormality.  Likely demand ischemia.  Hypomagnesemia: Resolved.  Elevated LFTs: Wonder if this is likely due to shock, rhabdomyolysis as well.  Left is slightly worse compared to yesterday, will pursue abdominal ultrasound.  Hyperlipidemia: On atorvastatin PTA but holding that due to active rhabdomyolysis.  Essential hypertension: Blood pressure controlled but on the low normal side.  Continue home dose of Lopressor but hold lisinopril.  Right-sided wrist drop and right upper extremity weakness: Likely due to radial nerve palsy as he was found to have his right arm dropped over the commode.  It is being presumed that he was like that for 24 hours.  He is going to need extended therapy.  PT OT consulted.  They recommended CIR.  CIR is following.  Chronic thrombocytopenia: Slight drop in platelets.  Monitor for any bleeding and repeat labs in the morning.  Dysphagia: Was found to have some dysphagia while working with PT.  They recommended SLP consultation.  DVT prophylaxis: enoxaparin (LOVENOX) injection 40 mg Start: 08/10/22 0915 SCDs Start: 08/09/22 2244   Code Status: Full Code  Family Communication: none present at bedside.  Plan of care discussed with patient in length and he/she verbalized understanding and agreed with it.  Status is: Inpatient Remains inpatient appropriate because: Still very weak with rhabdomyolysis and needs IV fluids for another 24 hours.   Estimated body mass index is 24.72 kg/m as calculated from the following:   Height as of this encounter: '5\' 11"'$  (1.803  m).   Weight as of this encounter: 80.4 kg.    Nutritional Assessment: Body mass index is 24.72 kg/m.Marland Kitchen Seen by dietician.  I agree with the assessment and plan as outlined below: Nutrition Status:        . Skin Assessment: I have  examined the patient's skin and I agree with the wound assessment as performed by the wound care RN as outlined below:    Consultants:  None  Procedures:  None  Antimicrobials:  Anti-infectives (From admission, onward)    Start     Dose/Rate Route Frequency Ordered Stop   08/10/22 2200  vancomycin (VANCOREADY) IVPB 1500 mg/300 mL        1,500 mg 150 mL/hr over 120 Minutes Intravenous Every 24 hours 08/09/22 2153 08/16/22 2159   08/10/22 1000  ceFEPIme (MAXIPIME) 2 g in sodium chloride 0.9 % 100 mL IVPB        2 g 200 mL/hr over 30 Minutes Intravenous Every 12 hours 08/09/22 2152 08/17/22 0959   08/09/22 2130  ceFEPIme (MAXIPIME) 2 g in sodium chloride 0.9 % 100 mL IVPB        2 g 200 mL/hr over 30 Minutes Intravenous  Once 08/09/22 2120 08/09/22 2212   08/09/22 2130  metroNIDAZOLE (FLAGYL) IVPB 500 mg        500 mg 100 mL/hr over 60 Minutes Intravenous  Once 08/09/22 2120 08/09/22 2328   08/09/22 2130  vancomycin (VANCOCIN) IVPB 1000 mg/200 mL premix  Status:  Discontinued        1,000 mg 200 mL/hr over 60 Minutes Intravenous  Once 08/09/22 2120 08/09/22 2123   08/09/22 2130  vancomycin (VANCOREADY) IVPB 1750 mg/350 mL        1,750 mg 175 mL/hr over 120 Minutes Intravenous  Once 08/09/22 2123 08/10/22 0132         Subjective:  Patient seen and examined.  He has no complaint.  His weakness on the right upper extremity is improving.  He is fully alert and oriented.  Objective: Vitals:   08/10/22 1942 08/11/22 0009 08/11/22 0430 08/11/22 0731  BP: (!) 102/57 (!) 100/58 121/69 136/72  Pulse: 67 60 63 80  Resp:    16  Temp: 98.9 F (37.2 C) 98.3 F (36.8 C) 98.5 F (36.9 C) 98.4 F (36.9 C)  TempSrc: Oral Oral Oral Oral  SpO2: 100% 96% 98%   Weight:      Height:        Intake/Output Summary (Last 24 hours) at 08/11/2022 1102 Last data filed at 08/10/2022 1403 Gross per 24 hour  Intake --  Output 1900 ml  Net -1900 ml   Filed Weights   08/09/22 1956   Weight: 80.4 kg    Examination:  General exam: Appears calm and comfortable  Respiratory system: Clear to auscultation. Respiratory effort normal. Cardiovascular system: S1 & S2 heard, RRR. No JVD, murmurs, rubs, gallops or clicks. No pedal edema. Gastrointestinal system: Abdomen is nondistended, soft and nontender. No organomegaly or masses felt. Normal bowel sounds heard. Central nervous system: Alert and oriented.  4/5 power in right upper extremity. Extremities: Symmetric 5 x 5 power. Skin: No rashes, lesions or ulcers.  Psychiatry: Judgement and insight appear normal. Mood & affect appropriate.   Data Reviewed: I have personally reviewed following labs and imaging studies  CBC: Recent Labs  Lab 08/09/22 2004 08/09/22 2009 08/10/22 0400 08/11/22 0600  WBC 14.0*  --  11.1* 9.8  NEUTROABS 12.5*  --  9.3* 7.6  HGB 15.5 15.6  15.3 12.0* 10.0*  HCT 44.1 46.0  45.0 35.7* 29.4*  MCV 98.4  --  100.8* 99.7  PLT 138*  --  114* 91*    Basic Metabolic Panel: Recent Labs  Lab 08/09/22 2004 08/09/22 2009 08/09/22 2230 08/10/22 0400 08/11/22 0600  NA 137 135  135  --  136 136  K 5.0 5.1  5.1  --  4.4 3.8  CL 96* 100  --  103 103  CO2 24  --   --  23 23  GLUCOSE 172* 171*  --  148* 108*  BUN 19 22  --  18 26*  CREATININE 1.22 1.00  --  1.04 0.99  CALCIUM 9.5  --   --  8.3* 8.3*  MG  --   --  1.7 1.6*  --   PHOS  --   --   --  3.8  --     GFR: Estimated Creatinine Clearance: 60.2 mL/min (by C-G formula based on SCr of 0.99 mg/dL). Liver Function Tests: Recent Labs  Lab 08/09/22 2004 08/10/22 0400 08/11/22 0600  AST 132* 182* 200*  ALT 37 47* 57*  ALKPHOS 80 51 48  BILITOT 2.4* 2.5* 1.4*  PROT 8.0 5.7* 5.4*  ALBUMIN 4.3 3.1* 2.8*    No results for input(s): "LIPASE", "AMYLASE" in the last 168 hours. Recent Labs  Lab 08/10/22 0400  AMMONIA 13    Coagulation Profile: Recent Labs  Lab 08/09/22 2004 08/10/22 0400  INR 1.1 1.2    Cardiac  Enzymes: Recent Labs  Lab 08/09/22 2004 08/10/22 0400 08/10/22 1706 08/11/22 0600  CKTOTAL 6,868* 9,203* 8,205* 6,408*    BNP (last 3 results) No results for input(s): "PROBNP" in the last 8760 hours. HbA1C: No results for input(s): "HGBA1C" in the last 72 hours. CBG: No results for input(s): "GLUCAP" in the last 168 hours. Lipid Profile: No results for input(s): "CHOL", "HDL", "LDLCALC", "TRIG", "CHOLHDL", "LDLDIRECT" in the last 72 hours. Thyroid Function Tests: Recent Labs    08/10/22 0400  TSH 1.029    Anemia Panel: Recent Labs    08/10/22 0400  FERRITIN 87  TIBC 266  IRON 43*    Sepsis Labs: Recent Labs  Lab 08/09/22 2004 08/09/22 2230 08/10/22 0400 08/10/22 1706  PROCALCITON  --   --  <0.10  --   LATICACIDVEN 4.2* 3.7* 2.0* 2.5*     Recent Results (from the past 240 hour(s))  Urine Culture     Status: None   Collection Time: 08/09/22  8:59 PM   Specimen: In/Out Cath Urine  Result Value Ref Range Status   Specimen Description IN/OUT CATH URINE  Final   Special Requests NONE  Final   Culture   Final    NO GROWTH Performed at [facility], [address], [place], [place] [address]    Report Status 08/10/2022 FINAL  Final  Blood Culture (routine x 2)     Status: None (Preliminary result)   Collection Time: 08/09/22  9:00 PM   Specimen: BLOOD  Result Value Ref Range Status   Specimen Description BLOOD LEFT ARM  Final   Special Requests   Final    BOTTLES DRAWN AEROBIC AND ANAEROBIC Blood Culture results may not be optimal due to an excessive volume of blood received in culture bottles   Culture   Final    NO GROWTH 2 DAYS Performed at [facility], [place] [address] [place], [place] [address]  Report Status PENDING  Incomplete  Blood Culture (routine x 2)     Status: None (Preliminary result)   Collection Time: 08/09/22  9:10 PM   Specimen: BLOOD RIGHT ARM  Result Value Ref Range Status   Specimen Description BLOOD RIGHT ARM   Final   Special Requests   Final    BOTTLES DRAWN AEROBIC AND ANAEROBIC Blood Culture adequate volume   Culture   Final    NO GROWTH 2 DAYS Performed at [facility], [address], [place], [place] [address]    Report Status PENDING  Incomplete  SARS Coronavirus 2 by RT PCR (hospital order, performed in [facility] hospital lab) *cepheid single result test* Anterior Nasal Swab     Status: None   Collection Time: 08/09/22 10:30 PM   Specimen: Anterior Nasal Swab  Result Value Ref Range Status   SARS Coronavirus 2 by RT PCR NEGATIVE NEGATIVE Final    Comment: (NOTE) SARS-CoV-2 target nucleic acids are NOT DETECTED.  The SARS-CoV-2 RNA is generally detectable in upper and lower respiratory specimens during the acute phase of infection. The lowest concentration of SARS-CoV-2 viral copies this assay can detect is 250 copies / mL. A negative result does not preclude SARS-CoV-2 infection and should not be used as the sole basis for treatment or other patient management decisions.  A negative result may occur with improper specimen collection / handling, submission of specimen other than nasopharyngeal swab, presence of viral mutation(s) within the areas targeted by this assay, and inadequate number of viral copies (<250 copies / mL). A negative result must be combined with clinical observations, patient history, and epidemiological information.  Fact Sheet for Patients:   https://www.patel.info/  Fact Sheet for Healthcare Providers: https://hall.com/  This test is not yet approved or  cleared by the [place] FDA and has been authorized for detection and/or diagnosis of SARS-CoV-2 by FDA under an Emergency Use Authorization (EUA).  This EUA will remain in effect (meaning this test can be used) for the duration of the COVID-19 declaration under Section 564(b)(1) of the Act, 21 U.S.C. section 360bbb-3(b)(1), unless the authorization  is terminated or revoked sooner.  Performed at [facility], [place] [address] [place], [place] [address]      Radiology Studies: ECHOCARDIOGRAM COMPLETE  Result Date: 08/10/2022    ECHOCARDIOGRAM REPORT   Patient Name:   ROWIN BAYRON Date of Exam: 08/10/2022 Medical Rec #:  [id number]        Height:       71.0 in Accession #:    [id number]       Weight:       177.2 lb Date of Birth:  1936-07-30       BSA:          2.003 m Patient Age:    79 years         BP:           107/65 mmHg Patient Gender: M                HR:           72 bpm. Exam Location:  Inpatient Procedure: 2D Echo, Color Doppler, Cardiac Doppler and Intracardiac            Opacification Agent Indications:    Elevated troponin  History:        Patient has prior history of Echocardiogram examinations, most  recent 12/22/2021. Pacemaker; Risk Factors:Hypertension and                 Dyslipidemia.  Sonographer:    Memory Argue Referring Phys: [id number] [place]  1. Left ventricular ejection fraction, by estimation, is 60 to 65%. The left ventricle has normal function. The left ventricle has no regional wall motion abnormalities. There is mild concentric left ventricular hypertrophy. Left ventricular diastolic parameters are consistent with Grade I diastolic dysfunction (impaired relaxation).  2. Right ventricular systolic function was not well visualized. The right ventricular size is normal. Tricuspid regurgitation signal is inadequate for assessing PA pressure.  3. Left atrial size was mild to moderately dilated.  4. The mitral valve is degenerative. No evidence of mitral valve regurgitation. No evidence of mitral stenosis.  5. The aortic valve was not well visualized. There is moderate calcification of the aortic valve. Aortic valve regurgitation is not visualized. Moderate aortic valve stenosis. Aortic valve area, by VTI measures 1.38 cm. Aortic valve mean gradient measures 23.0 mmHg.  6.  Technically difficult study with poor acoustic windows. FINDINGS  Left Ventricle: Left ventricular ejection fraction, by estimation, is 60 to 65%. The left ventricle has normal function. The left ventricle has no regional wall motion abnormalities. The left ventricular internal cavity size was normal in size. There is  mild concentric left ventricular hypertrophy. Left ventricular diastolic parameters are consistent with Grade I diastolic dysfunction (impaired relaxation). Right Ventricle: The right ventricular size is normal. Right vetricular wall thickness was not well visualized. Right ventricular systolic function was not well visualized. Tricuspid regurgitation signal is inadequate for assessing PA pressure. Left Atrium: Left atrial size was mild to moderately dilated. Right Atrium: Right atrial size was normal in size. Pericardium: There is no evidence of pericardial effusion. Mitral Valve: The mitral valve is degenerative in appearance. There is mild calcification of the mitral valve leaflet(s). Mild to moderate mitral annular calcification. No evidence of mitral valve regurgitation. No evidence of mitral valve stenosis. Tricuspid Valve: The tricuspid valve is normal in structure. Tricuspid valve regurgitation is not demonstrated. Aortic Valve: The aortic valve was not well visualized. There is moderate calcification of the aortic valve. Aortic valve regurgitation is not visualized. Moderate aortic stenosis is present. Aortic valve mean gradient measures 23.0 mmHg. Aortic valve peak gradient measures 42.5 mmHg. Aortic valve area, by VTI measures 1.38 cm. Pulmonic Valve: The pulmonic valve was normal in structure. Pulmonic valve regurgitation is not visualized. Aorta: The aortic root is normal in size and structure. Venous: The inferior vena cava was not well visualized. IAS/Shunts: No atrial level shunt detected by color flow Doppler.  LEFT VENTRICLE PLAX 2D LVIDd:         4.10 cm   Diastology LVIDs:          2.60 cm   LV e' medial:    5.87 cm/s LV PW:         1.30 cm   LV E/e' medial:  12.3 LV IVS:        1.30 cm   LV e' lateral:   7.83 cm/s LVOT diam:     2.20 cm   LV E/e' lateral: 9.2 LV SV:         81 LV SV Index:   40 LVOT Area:     3.80 cm  LEFT ATRIUM             Index        RIGHT ATRIUM  Index LA diam:        3.40 cm 1.70 cm/m   RA Area:     15.10 cm LA Vol (A2C):   80.2 ml 40.04 ml/m  RA Volume:   34.80 ml  17.37 ml/m LA Vol (A4C):   72.5 ml 36.19 ml/m LA Biplane Vol: 76.6 ml 38.24 ml/m  AORTIC VALVE AV Area (Vmax):    1.27 cm AV Area (Vmean):   1.27 cm AV Area (VTI):     1.38 cm AV Vmax:           326.00 cm/s AV Vmean:          213.250 cm/s AV VTI:            0.583 m AV Peak Grad:      42.5 mmHg AV Mean Grad:      23.0 mmHg LVOT Vmax:         109.00 cm/s LVOT Vmean:        71.200 cm/s LVOT VTI:          0.212 m LVOT/AV VTI ratio: 0.36  AORTA Ao Root diam: 2.30 cm Ao Asc diam:  3.00 cm MITRAL VALVE MV Area (PHT): 2.91 cm     SHUNTS MV Decel Time: 261 msec     Systemic VTI:  0.21 m MV E velocity: 72.40 cm/s   Systemic Diam: 2.20 cm MV A velocity: 123.00 cm/s MV E/A ratio:  0.59 Dalton McleanMD Electronically signed by Franki Monte Signature Date/Time: 08/10/2022/2:02:57 PM    Final    DG Shoulder Right Portable  Result Date: 08/09/2022 CLINICAL DATA:  Fall.  Altered mental status. EXAM: RIGHT SHOULDER - 1 VIEW COMPARISON:  None Available. FINDINGS: Degenerative changes in the acromioclavicular and glenohumeral joints. Calcification in the subacromial space consistent with calcific tendinosis. No evidence of acute fracture or dislocation. No focal bone lesion or bone destruction. Vascular calcifications in the soft tissues. IMPRESSION: Degenerative changes in the right shoulder. Soft tissue calcifications suggesting calcific tendinosis. No acute bony abnormalities. Electronically Signed   By: Lucienne Capers M.D.   On: 08/09/2022 22:12   DG Pelvis Portable  Result Date:  08/09/2022 CLINICAL DATA:  Fall, altered mental status EXAM: PORTABLE PELVIS 1-2 VIEWS COMPARISON:  None Available. FINDINGS: Normal alignment. No acute fracture or dislocation. Sacroiliac and hip joint spaces are preserved. Advanced vascular calcifications are noted within the pelvis and medial thighs. IMPRESSION: No acute fracture or dislocation. Electronically Signed   By: Fidela Salisbury M.D.   On: 08/09/2022 21:06   DG Chest Port 1 View  Result Date: 08/09/2022 CLINICAL DATA:  Altered mental status. EXAM: PORTABLE CHEST 1 VIEW COMPARISON:  06/03/2022 FINDINGS: Cardiac pacemaker. Postoperative changes in the mediastinum. Shallow inspiration. Heart size and pulmonary vascularity are normal. Lungs are clear. No pleural effusions. No pneumothorax. Mediastinal contours appear intact. IMPRESSION: No active disease. Electronically Signed   By: Lucienne Capers M.D.   On: 08/09/2022 21:05   CT ANGIO HEAD NECK W WO CM  Result Date: 08/09/2022 CLINICAL DATA:  Follow-up examination for stroke. EXAM: CT ANGIOGRAPHY HEAD AND NECK TECHNIQUE: Multidetector CT imaging of the head and neck was performed using the standard protocol during bolus administration of intravenous contrast. Multiplanar CT image reconstructions and MIPs were obtained to evaluate the vascular anatomy. Carotid stenosis measurements (when applicable) are obtained utilizing NASCET criteria, using the distal internal carotid diameter as the denominator. RADIATION DOSE REDUCTION: This exam was performed according to the departmental dose-optimization program which includes automated  exposure control, adjustment of the mA and/or kV according to patient size and/or use of iterative reconstruction technique. CONTRAST:  83m OMNIPAQUE IOHEXOL 350 MG/ML SOLN COMPARISON:  Prior study from 06/20/2022 FINDINGS: CT HEAD FINDINGS Brain: Age-related cerebral atrophy with mild chronic small vessel ischemic disease. No acute intracranial hemorrhage. No acute  large vessel territory infarct. No mass lesion or midline shift. Ventricular prominence related global parenchymal atrophy without hydrocephalus. No extra-axial fluid collection. Vascular: No hyperdense vessel. Calcified atherosclerosis present at skull base. Skull: Scalp soft tissues and calvarium demonstrate no acute finding. Sinuses/Orbits: Globes orbital soft tissues within normal limits. Paranasal sinuses are clear. Other: No mastoid effusion. Review of the MIP images confirms the above findings CTA NECK FINDINGS Aortic arch: Partially visualized aortic arch within normal limits for caliber. Origin of the great vessels incompletely visualized on this exam. No visible stenosis. Right carotid system: Right common and internal carotid arteries patent without dissection or occlusion. Bulky calcified plaque about the right carotid bulb/proximal right ICA with associated stenosis of up to 65% by NASCET criteria. Left carotid system: Left common and internal carotid arteries patent without dissection or occlusion. Bulky calcified plaque about the left carotid bulb with associated stenosis of up to 50% by NASCET criteria. Vertebral arteries: Both vertebral arteries arise from the subclavian arteries. No significant proximal subclavian artery stenosis. Left vertebral artery slightly dominant. Vertebral arteries patent without stenosis, dissection or occlusion. Skeleton: No visible worrisome osseous lesions. Moderate to advanced multilevel cervical spondylosis, most pronounced at C6-7. Other neck: No other acute soft tissue abnormality within the neck. Upper chest: Visualized upper chest demonstrates no acute finding. Left-sided pacemaker/AICD noted. Review of the MIP images confirms the above findings CTA HEAD FINDINGS Anterior circulation: Moderate atheromatous plaque within the carotid siphons with no more than mild diffuse stenosis. A1 segments patent bilaterally. Normal anterior communicating complex. Both anterior  cerebral arteries widely patent. Normal in stenosis or occlusion. No proximal MCA branch occlusion or stenosis. Distal MCA branches perfused and symmetric. Posterior circulation: Both V4 segments patent without significant stenosis. Left PICA patent. Right PICA not seen. Basilar patent to its distal aspect without stenosis. Superior cerebellar arteries patent bilaterally. Both PCAs primarily supplied via the basilar well perfused or distal aspects. Small left posterior communicating artery noted. Venous sinuses: Patent allowing for timing the contrast bolus. Anatomic variants: None significant. Review of the MIP images confirms the above findings IMPRESSION: 1. Negative CTA for large vessel occlusion or other emergent finding. 2. Bulky calcified plaque about the carotid bifurcations bilaterally with associated stenoses of up to 65% on the right and 50% on the left. 3. No other hemodynamically significant or correctable stenosis about the major arterial vasculature of the head and neck. 4. No other acute intracranial abnormality. 5. Underlying age-related cerebral atrophy with chronic microvascular ischemic disease. Electronically Signed   By: BJeannine BogaM.D.   On: 08/09/2022 20:53   CT Cervical Spine Wo Contrast  Result Date: 08/09/2022 CLINICAL DATA:  Neck trauma, intoxicated or obtunded (Age >= 16y) EXAM: CT CERVICAL SPINE WITHOUT CONTRAST TECHNIQUE: Multidetector CT imaging of the cervical spine was performed without intravenous contrast. Multiplanar CT image reconstructions were also generated. RADIATION DOSE REDUCTION: This exam was performed according to the departmental dose-optimization program which includes automated exposure control, adjustment of the mA and/or kV according to patient size and/or use of iterative reconstruction technique. COMPARISON:  06/20/2022 FINDINGS: Alignment: Normal. Skull base and vertebrae: Craniocervical alignment is normal. The atlantodental interval is not  widened. No  acute fracture of the cervical spine. Vertebral body height is preserved. Soft tissues and spinal canal: No prevertebral fluid or swelling. No visible canal hematoma. Advanced atherosclerotic calcification within the carotid bifurcations Disc levels: There is intervertebral disc space narrowing and endplate remodeling throughout the cervical spine, most severe at C5-T2 in keeping with changes of advanced degenerative disc disease. Prevertebral soft tissues are not thickened. Spinal canal is widely patent. Multilevel uncovertebral and facet arthrosis results in multilevel mild-to-moderate neuroforaminal narrowing, most severe on the right at C3-4 and C4-5 and on the left at C6-7. Upper chest: Negative. Other: None IMPRESSION: 1. No acute fracture or listhesis of the cervical spine. 2. Multilevel degenerative disc disease and facet arthrosis resulting in multilevel neuroforaminal narrowing as described above. 3. Advanced atherosclerotic calcification within the carotid bifurcations bilaterally. This is better assessed on accompanying CT arteriography Electronically Signed   By: Fidela [place] M.D.   On: 08/09/2022 20:44    Scheduled Meds:  enoxaparin (LOVENOX) injection  40 mg Subcutaneous Q24H   metoprolol tartrate  12.5 mg Oral BID   Continuous Infusions:  ceFEPime (MAXIPIME) IV 2 g (08/11/22 1008)   lactated ringers 150 mL/hr at 08/11/22 1013   vancomycin 1,500 mg (08/10/22 2255)     LOS: 2 days   Darliss Cheney, MD [facility]  08/11/2022, 11:02 AM   *Please note that this is a verbal dictation therefore any spelling or grammatical errors are due to the "[employer]" system interpretation.  Please page via [place] and do not message via secure chat for urgent patient care matters. Secure chat can be used for non urgent patient care matters.  How to contact the [facility] Attending or Consulting provider [place] or covering provider during after hours [place], for this patient?   Check the care team in [facility] and look for a) attending/consulting [facility] provider listed and b) the [facility] team listed. Page or secure chat [phone]. Log into [email or web address] and use [place]'s universal password to access. If you do not have the password, please contact the hospital operator. Locate the [facility] provider you are looking for under [facility] and page to a number that you can be directly reached. If you still have difficulty reaching the provider, please page the [facility] (Director on Call) for the Hospitalists listed on amion for assistance.

## 2022-08-11 NOTE — Evaluation (Signed)
Clinical/Bedside Swallow Evaluation Patient Details  Name: Joshua [name] MRN: [id number] Date of Birth: August 31, 1936  Today's Date: 08/11/2022 Time: SLP Start Time (ACUTE ONLY): 1221 SLP Stop Time (ACUTE ONLY): 1226 SLP Time Calculation (min) (ACUTE ONLY): 5 min  Past Medical History:  Past Medical History:  Diagnosis Date   AKI (acute kidney injury) ([place]) 08/10/2022   Arthritis    Cataract    Esophageal stricture    GERD (gastroesophageal reflux disease)    Hyperlipidemia    Hypertension    Liver disease    hepatitis   Normocytic anemia 06/04/2022   Pacemaker    Rhabdomyolysis 06/04/2022   Severe aortic stenosis    Past Surgical History:  Past Surgical History:  Procedure Laterality Date   AORTIC VALVE REPLACEMENT N/A 11/04/2021   Procedure: AORTIC VALVE REPLACEMENT (AVR) USING 23 MM INSPIRIS RESILIA  AORTIC VALVE;  Surgeon: Lajuana Matte, MD;  Location: [place];  Service: Open Heart Surgery;  Laterality: N/A;   cataract  2013   CORONARY ARTERY BYPASS GRAFT N/A 11/04/2021   Procedure: CORONARY ARTERY BYPASS GRAFTING (CABG) X 3, USING LEFT INTERNAL MAMMARY ARTERY AND ENDOSCOPICALLY HARVESTED RIGHT GREATER SAPHENOUS VEIN. LIMA TO LAD, SVG TO PDA, SVG TO OM;  Surgeon: Lajuana Matte, MD;  Location: [place];  Service: Open Heart Surgery;  Laterality: N/A;   ENDOVEIN HARVEST OF GREATER SAPHENOUS VEIN Right 11/04/2021   Procedure: ENDOVEIN HARVEST OF GREATER SAPHENOUS VEIN;  Surgeon: Lajuana Matte, MD;  Location: [place];  Service: Open Heart Surgery;  Laterality: Right;   EXPLORATION POST OPERATIVE OPEN HEART N/A 11/05/2021   Procedure: EXPLORATION POST OPERATIVE OPEN HEARTFOR BLEEDING;  Surgeon: Melrose Nakayama, MD;  Location: [place];  Service: Open Heart Surgery;  Laterality: N/A;   HEMORROIDECTOMY  2001   HERNIA REPAIR  2005   INTRAVASCULAR PRESSURE WIRE/FFR STUDY N/A 11/03/2021   Procedure: INTRAVASCULAR PRESSURE WIRE/FFR STUDY;  Surgeon: Burnell Blanks, MD;  Location: [place] CV [facility];  Service: Cardiovascular;  Laterality: N/A;   PACEMAKER IMPLANT N/A 11/09/2021   Procedure: PACEMAKER IMPLANT;  Surgeon: Vickie Epley, MD;  Location: [place] [facility];  Service: Cardiovascular;  Laterality: N/A;   RIGHT/LEFT HEART CATH AND CORONARY ANGIOGRAPHY N/A 11/03/2021   Procedure: RIGHT/LEFT HEART CATH AND CORONARY ANGIOGRAPHY;  Surgeon: Burnell Blanks, MD;  Location: [place] [facility];  Service: Cardiovascular;  Laterality: N/A;   TEE WITHOUT CARDIOVERSION N/A 11/04/2021   Procedure: TRANSESOPHAGEAL ECHOCARDIOGRAM (TEE);  Surgeon: Lajuana Matte, MD;  Location: [place];  Service: Open Heart Surgery;  Laterality: N/A;   TEMPORARY PACEMAKER N/A 11/07/2021   Procedure: TEMPORARY PACEMAKER;  Surgeon: [name], MD;  Location: [place] [facility];  Service: Cardiovascular;  Laterality: N/A;   TONSILLECTOMY  1948   HPI:  Joshua [name] is a 86 y.o. male who [facility] on [facility]2022 with suspected acute metabolic encephalopathy in the setting of rhabdomyolysis after presenting from home to [facility] ED altered mental status. CXR 10/22 with no active disease.  Head CT 10/221 with no acute findings.  Pt with medical history significant for coronary disease status post three-vessel CABG in January 2023, severe aortic stenosis status post aortic valve replacement in January 2023 status post pacemaker placement, hypertension, hyperlipidemia,    Assessment / Plan / Recommendation  Clinical Impression  Pt presents with functional swallowing as assessed clinically.  Pt tolerated all consistencies trialed, including serial straw sips of thin liquid, with  no clinical s/s of aspiration and exhibited good oral clearance of solids.    Recommend continuing regular texture diet with thin liquids.  SLP will sign off at this time as pt has no further ST needs.  SLP Visit Diagnosis: Dysphagia, unspecified (R13.10)    Aspiration  Risk  No limitations    Diet Recommendation Regular;Thin liquid   Liquid Administration via: Cup;Straw Medication Administration: Whole meds with liquid Compensations: Slow rate;Small sips/bites Postural Changes: Seated upright at 90 degrees    Other  Recommendations Oral Care Recommendations: Oral care BID    Recommendations for follow up therapy are one component of a multi-disciplinary discharge planning process, led by the attending physician.  Recommendations may be updated based on patient status, additional functional criteria and insurance authorization.  Follow up Recommendations No SLP follow up      Assistance Recommended at Discharge None  Functional Status Assessment Patient has not had a recent decline in their functional status  Frequency and Duration  (N/A)          Prognosis Prognosis for Safe Diet Advancement:  (N/A)      Swallow Study   General Date of Onset: 08/09/22 HPI: Joshua [name] is a 86 y.o. male who presented to [facility] on 08/09/2022 with suspected acute metabolic encephalopathy in the setting of rhabdomyolysis after presenting from home to [facility] ED altered mental status. CXR 10/22 with no active disease.  Head CT 10/221 with no acute findings.  Pt with medical history significant for coronary disease status post three-vessel CABG in January 2023, severe aortic stenosis status post aortic valve replacement in January 2023 status post pacemaker placement, hypertension, hyperlipidemia, Type of Study: Bedside Swallow Evaluation Previous Swallow Assessment: none Diet Prior to this Study: Regular Temperature Spikes Noted: No History of Recent Intubation: No Behavior/Cognition: Alert;Cooperative;Pleasant mood Oral Cavity Assessment: Within Functional Limits Oral Care Completed by SLP: No Oral Cavity - Dentition: Adequate natural dentition Patient Positioning: Upright in chair Baseline Vocal Quality: Normal Volitional Cough:  Strong Volitional Swallow: Able to elicit    Oral/Motor/Sensory Function Overall Oral Motor/Sensory Function: Within functional limits Facial ROM: Within Functional Limits Facial Symmetry: Within Functional Limits Lingual ROM: Within Functional Limits Lingual Symmetry: Within Functional Limits Lingual Strength: Within Functional Limits Velum: Within Functional Limits Mandible: Within Functional Limits   Ice Chips Ice chips: Not tested   Thin Liquid Thin Liquid: Within functional limits Presentation: Straw    Nectar Thick Nectar Thick Liquid: Not tested   Honey Thick Honey Thick Liquid: Not tested   Puree Puree: Within functional limits Presentation: Spoon   Solid     Solid: Within functional limits Presentation:  (SLP fed)      Celedonio Savage, [place], [place] Office: [phone]  08/11/2022,12:33 PM

## 2022-08-11 NOTE — Progress Notes (Signed)
  Inpatient Rehabilitation Admissions Coordinator   Met with patient and spoke to son, [name] by phone for rehab assessment. We discussed goals and expectations of a possible CIR admit. They prefer CIR for rehab. [name] is hopeful that Dad would progress to level after CIR, for direct discharge to ILF at [facility] [facility]. I will begin insurance Auth with [facility] Medicare for possible CIR admit pending approval. Please call me with any questions.   Danne Baxter, RN, MSN Rehab Admissions Coordinator [phone]

## 2022-08-11 NOTE — Progress Notes (Addendum)
Mobility Specialist: Progress Note   08/11/22 1553  Mobility  Activity Transferred to/from [facility];Transferred from chair to bed  Level of Assistance Moderate assist, patient does 50-74%  Assistive Device Front wheel walker  Distance Ambulated (ft) 8 ft (2'+2'+4')  Activity Response Tolerated fair  Mobility Referral Yes  $Mobility charge 1 Mobility   Pt received on BSC with NT present in the room. Pt agreeable to mobility after pericare. Heavy modA to stand and minA with transfers. Difficulty maintaining R hand on RW during session. Pt assisted with pericare and pivot to the chair. After standing x2 pt had BM and transferred back to the [facility]. Pt assisted with pericare with help from NT and then assisted back to bed. NT present in the room.   [place] Charidy Cappelletti Mobility Specialist Secure Chat Only

## 2022-08-11 NOTE — PMR Pre-admission (Shared)
PMR Admission Coordinator Pre-Admission Assessment  Patient: Joshua [name] is an 86 y.o., male MRN: [id number] DOB: December 18, 1935 Height: 5' 11"  (180.3 cm) Weight: 80.4 kg  Insurance Information HMO:     PPO:      PCP:      IPA:      80/20:      OTHER:  PRIMARY: [employer]      Policy#: [id number]      Subscriber: pt CM Name: ***      Phone#: ***     Fax#: *** Pre-Cert#: ***      Employer: *** Benefits:  Phone #: ***     Name: *** Irene Shipper. Date: ***     Deduct: ***      Out of Pocket Max: ***      Life Max: *** CIR: ***      SNF: *** Outpatient: ***     Co-Pay: *** Home Health: ***      Co-Pay: *** DME: ***     Co-Pay: *** Providers: *** SECONDARY: none  Financial Counselor:       Phone#:   The "Data Collection Information Summary" for patients in Inpatient Rehabilitation Facilities with attached "Privacy Act [place] Records" was provided and verbally reviewed with: Family  Emergency Contact Information Contact Information     Name Relation Home Work Mobile   [place] Son [phone]  [phone]   Park, Beck   [phone]      Current Medical History  Patient Admitting Diagnosis: Encephalopathy after fall  History of Present Illness: 86 year old male with history of CAD s/p CABG 1/23, severe aortic stenosis s/p aortic valve replacement with pacemaker, HTN, HLD who presented on 08/09/22 with suspected acute metabolic encephalopathy in the setting of rhabdomyolysis after fall at home.  CT scan unremarkable. CK improving with his rhabdomyolysis. Receiving IVF. Patient more alert. Lactic acidosis improving. Initially with fever with SIRS with temp 101.3. UA and CXR negative as well as blood cultures. Elevated troponin on admit. Echo with normal EF. BP controlled but on the low side. To continue home lopressor dose., but hold lisinopril.   Right sided wrist drop and right upper extremity weakness. Felt likely due to radial nerve palsy as he was found  down at home with his right arm dropped over the commode. Presumed that he was there for 24 hrs. Lovenox for DVT prophylaxis.    Patient's medical record from [facility] has been reviewed by the rehabilitation admission coordinator and physician.  Past Medical History  Past Medical History:  Diagnosis Date   AKI (acute kidney injury) ([place]) 08/10/2022   Arthritis    Cataract    Esophageal stricture    GERD (gastroesophageal reflux disease)    Hyperlipidemia    Hypertension    Liver disease    hepatitis   Normocytic anemia 06/04/2022   Pacemaker    Rhabdomyolysis 06/04/2022   Severe aortic stenosis    Has the patient had major surgery during 100 days prior to admission? No  Family History   family history includes Cancer in his maternal grandmother; Diabetes in his child; Heart Problems (age of onset: 69) in his child; Heart failure (age of onset: 11) in his father; Heart failure (age of onset: 37) in his mother.  Current Medications  Current Facility-Administered Medications:    acetaminophen (TYLENOL) tablet 650 mg, 650 mg, Oral, Q6H PRN **OR** acetaminophen (TYLENOL) suppository 650 mg, 650 mg, Rectal, Q6H PRN, Howerter, Justin B, DO   enoxaparin (  LOVENOX) injection 40 mg, 40 mg, Subcutaneous, Q24H, Ventura Sellers, RPH, 40 mg at 08/11/22 1006   lactated ringers infusion, , Intravenous, Continuous, Pahwani, Ravi, MD, Last Rate: 100 mL/hr at 08/11/22 1352, Infusion Verify at 08/11/22 1352   metoprolol tartrate (LOPRESSOR) tablet 12.5 mg, 12.5 mg, Oral, BID, Howerter, Justin B, DO, 12.5 mg at 08/11/22 1006   pantoprazole (PROTONIX) EC tablet 40 mg, 40 mg, Oral, Daily, Darliss Cheney, MD  Patients Current Diet:  Diet Order             Diet Heart Room service appropriate? Yes; Fluid consistency: Thin  Diet effective now                  Precautions / Restrictions Precautions Precautions: Fall, Other (comment) Other Brace: R resting hand  splint Restrictions Weight Bearing Restrictions: No   Has the patient had 2 or more falls or a fall with injury in the past year? Yes  Prior Activity Level Limited Community (1-2x/wk): Mod I with RW in home, cane in the community  Prior Functional Level Self Care: Did the patient need help bathing, dressing, using the toilet or eating? Independent  Indoor Mobility: Did the patient need assistance with walking from room to room (with or without device)? Independent  Stairs: Did the patient need assistance with internal or external stairs (with or without device)? Independent  Functional Cognition: Did the patient need help planning regular tasks such as shopping or remembering to take medications? Independent  Patient Information Are you of Hispanic, Latino/a,or Spanish origin?: A. No, not of Hispanic, Latino/a, or Spanish origin What is your race?: A. White Do you need or want an interpreter to communicate with a doctor or health care staff?: 0. No  Patient's Response To:  Health Literacy and Transportation Is the patient able to respond to health literacy and transportation needs?: No Health Literacy - How often do you need to have someone help you when you read instructions, pamphlets, or other written material from your doctor or pharmacy?: Patient unable to respond In the past 12 months, has lack of transportation kept you from medical appointments or from getting medications?: No In the past 12 months, has lack of transportation kept you from meetings, work, or from getting things needed for daily living?: No  Development worker, international aid / [place] Devices/Equipment: Bedside commode/3-in-1, Radio producer (specify quad or straight), Environmental consultant (specify type) Home Equipment: Rollator (4 wheels), Cane - single point, Toilet riser, Grab bars - toilet, Grab bars - tub/shower, Hand held shower head, Wheelchair - manual, Shower seat, Conservation officer, nature (2 wheels)  Prior Device Use: Indicate  devices/aids used by the patient prior to current illness, exacerbation or injury? Walker and cane  Current Functional Level Cognition  Overall Cognitive Status: Impaired/Different from baseline Current Attention Level: Sustained Orientation Level: Oriented X4 Following Commands: Follows one step commands inconsistently Safety/Judgement: Decreased awareness of safety, Decreased awareness of deficits General Comments: Poor motor planning, able to answer Oct 2023, following commands a little better today though worsens with fatigue. Perseveration on where his watch is    Extremity Assessment (includes Sensation/Coordination)  Upper Extremity Assessment: RUE deficits/detail RUE Deficits / Details: R UE with movement in all joints now though weakness still noted. Removed splint and left off to encourage fucntional movement of UE, propped on pillow for shoulder support RUE Sensation: decreased light touch, decreased proprioception RUE Coordination: decreased fine motor, decreased gross motor  Lower Extremity Assessment: Defer to PT evaluation RLE Deficits /  Details: ROM WFL; Difficulty with commands for MMT but appears strong/no obvious deficits.  Increased time, cues, demonstration and pt still had difficulty with heel slide motions - kept straightening knee and hip LLE Deficits / Details: ROM WFL; Difficulty with commands for MMT but appears strong/no obvious deficits.  Increased time, cues, demonstration and pt still had difficulty with heel slide motions - kept straightening knee and hip    ADLs  Overall ADL's : Needs assistance/impaired Eating/Feeding: Moderate assistance, Bed level Grooming: Moderate assistance, Bed level Upper Body Bathing: Maximal assistance, Bed level Lower Body Bathing: Total assistance, Bed level Upper Body Dressing : Maximal assistance, Bed level Lower Body Dressing: Total assistance, Bed level Toilet Transfer: Maximal assistance, +2 for physical assistance, +2  for safety/equipment, BSC/3in1 Toilet Transfer Details (indicate cue type and reason): use of Stedy, Mod-Max A x 2; increased assist needed with fatigue Toileting- Clothing Manipulation and Hygiene: Total assistance, Bed level General ADL Comments: Impaired cognition, poor motor planning and resistant to movements at times. Pt with impaired R UE function requiring extensive assist for basic tasks    Mobility  Overal bed mobility: Needs Assistance Bed Mobility: Supine to Sit Supine to sit: Max assist, HOB elevated Sit to supine: Total assist, +2 for physical assistance, +2 for safety/equipment General bed mobility comments: Pt able to initiate moving BLE to EOB but requires max assist to upright trunk & fully scoot to sitting EOB    Transfers  Overall transfer level: Needs assistance Equipment used: Rolling walker (2 wheels) Transfers: Sit to/from Stand, Bed to chair/wheelchair/BSC Sit to Stand: Max assist, +2 physical assistance Bed to/from chair/wheelchair/BSC transfer type:: Step pivot Step pivot transfers: Max assist, +2 physical assistance, +2 safety/equipment General transfer comment: STS from EOB with RW: Pt requires assistance for placing RUE on RW & cuing to grasp, pt with difficulty maintaining grasp on RW 2/2 decreased elbow/shoulder strength. Pt requires cuing for sequencing, to push to standing, pt able to complete STS with max assist +2. Pt completes step pivot bed>recliner on R with max assist +2 with PT assisting with maintaining R grasp on RW, assistance for maneuvering RW & cuing for stepping. Pt requires cuing & extra time to advance feet with pt demonstrating difficulty doing so. Pt attempted additional STS from recliner but unable even with max assist 2/2 fatigue. Utilized stedy lift for recliner<>BSC with min/mod assist +2 to complete STS with stedy device, max assist to shift pelvis anteriorly to come to full upright standing to allow therapists to place stedy lift seat.     Ambulation / Gait / Stairs / Office manager / Balance Dynamic Sitting Balance Sitting balance - Comments: supervision static sitting on BSC Balance Overall balance assessment: Needs assistance Sitting-balance support: Feet supported, Bilateral upper extremity supported Sitting balance-Leahy Scale: Fair Sitting balance - Comments: supervision static sitting on BSC Postural control: Other (comment) (anterior lean) Standing balance support: During functional activity, Bilateral upper extremity supported, Reliant on assistive device for balance Standing balance-Leahy Scale: Poor Standing balance comment: unable    Special needs/care consideration Fall precautions   Previous Home Environment  Living Arrangements: Alone  Lives With: Alone Available Help at Discharge: Family, Available PRN/intermittently Type of Home: House Home Layout: One level Home Access: Level entry Bathroom Shower/Tub: Multimedia programmer: Handicapped height Bathroom Accessibility: Yes How Accessible: Accessible via walker Home Care Services: No Additional Comments: Supposed to move into [facility] this week  Discharge Living Setting Plans for  Discharge Living Setting: Other (Comment) (to move to [facility] this week of admit to hospital) Type of Home at Discharge: [place] Name at Discharge: [facility] Does the patient have any problems obtaining your medications?: No  Social/Family/Support Systems Patient Roles: Parent Contact Information: son, [name] Anticipated Caregiver: son , [name] with ILF Anticipated Caregiver's Contact Information: see contacts Ability/Limitations of Caregiver: Randy prn only Caregiver Availability: Intermittent Discharge Plan Discussed with Primary Caregiver: Yes Is Caregiver In Agreement with Plan?: Yes Does Caregiver/Family have Issues with Lodging/Transportation while Pt is in Rehab?:  No  Goals Patient/Family Goal for Rehab: Mod I to intermittent supervision with PT, OT and SLP Expected length of stay: ELOS 10 to 14 days Pt/Family Agrees to Admission and willing to participate: Yes Program Orientation Provided & Reviewed with Pt/Caregiver Including Roles  & Responsibilities: Yes  Decrease burden of Care through IP rehab admission: n/a  Possible need for SNF placement upon discharge: not anticipated  Patient Condition: I have reviewed medical records from [facility], spoken with CM, and patient and family member. I met with patient at the bedside and discussed via phone for inpatient rehabilitation assessment.  Patient will benefit from ongoing PT, OT, and SLP, can actively participate in 3 hours of therapy a day 5 days of the week, and can make measurable gains during the admission.  Patient will also benefit from the coordinated team approach during an Inpatient Acute Rehabilitation admission.  The patient will receive intensive therapy as well as Rehabilitation physician, nursing, social worker, and care management interventions.  Due to bladder management, bowel management, safety, skin/wound care, disease management, medication administration, pain management, and patient education the patient requires 24 hour a day rehabilitation nursing.  The patient is currently *** with mobility and basic ADLs.  Discharge setting and therapy post discharge at home with home health is anticipated.  Patient has agreed to participate in the Acute Inpatient Rehabilitation Program and will admit today.  Preadmission Screen Completed By:  Cleatrice Burke, 08/11/2022 3:06 PM ______________________________________________________________________   Discussed status with Dr. Marland Kitchen on *** at *** and received approval for admission today.  Admission Coordinator:  Cleatrice Burke, RN, time Marland KitchenSudie Grumbling ***   Assessment/Plan: Diagnosis: Does the need for close, 24 hr/day Medical  supervision in concert with the patient's rehab needs make it unreasonable for this patient to be served in a less intensive setting? {yes_no_potentially:3041433} Co-Morbidities requiring supervision/potential complications: *** Due to {due UU:7253664}, does the patient require 24 hr/day rehab nursing? {yes_no_potentially:3041433} Does the patient require coordinated care of a physician, rehab nurse, PT, OT, and SLP to address physical and functional deficits in the context of the above medical diagnosis(es)? {yes_no_potentially:3041433} Addressing deficits in the following areas: {deficits:3041436} Can the patient actively participate in an intensive therapy program of at least 3 hrs of therapy 5 days a week? {yes_no_potentially:3041433} The potential for patient to make measurable gains while on inpatient rehab is {potential:3041437} Anticipated functional outcomes upon discharge from inpatient rehab: {functional outcomes:304600100} PT, {functional outcomes:304600100} OT, {functional outcomes:304600100} SLP Estimated rehab length of stay to reach the above functional goals is: *** Anticipated discharge destination: {anticipated dc setting:21604} 10. Overall Rehab/Functional Prognosis: {potential:3041437}   MD Signature: ***

## 2022-08-11 NOTE — Progress Notes (Addendum)
Physical Therapy Treatment Patient Details Name: Joshua [name] MRN: [id number] DOB: 1936-02-24 Today's Date: 08/11/2022   History of Present Illness Pt is an 86 y/o male who presented after a fall at home (on ground for approx 24 hr) and AMS in setting of acute metabolic encephalopathy and rhabdomyolysis. PMH: CAD, CABG (2023), aortic stenosis, PPM (2023), HTN, HLD    PT Comments    Pt seen for co-tx with OT & pt's son Joshua [name]) arriving midway through session. Pt received in bed & agreeable to tx, AxOx3 today but still demonstrating impaired awareness, processing, & motor planning. Pt requires max assist for supine>sit and max assist to fully scoot to sitting surface. Pt demonstrates improved RUE movement but still unable to maintain hold on RW without assistance 2/2 shoulder/elbow weakness. Pt is able to complete bed>recliner with RW & max assist +2 but afterwards, pt too fatigued to successfully complete transfer again with RW. Utilized stedy for pt & therapists' safety to assist pt recliner<>BSC. Pt with impaired cognition as pt unable to be redirected that he's not wearing pants while on BSC. Due to pt's steady progress with mobility, have upgraded pt's d/c recommendations to CIR as pt would benefit from ongoing therapy services to facilitate return to mod I PLOF. Will continue to follow pt acutely to address strengthening, balance, transfers, and gait with LRAD.  Requested SLP consult from MD.     Recommendations for follow up therapy are one component of a multi-disciplinary discharge planning process, led by the attending physician.  Recommendations may be updated based on patient status, additional functional criteria and insurance authorization.  Follow Up Recommendations  Acute inpatient rehab (3hours/day) Can patient physically be transported by private vehicle: No   Assistance Recommended at Discharge Frequent or constant Supervision/Assistance  Patient can return home with the  following Two people to help with bathing/dressing/bathroom;Two people to help with walking and/or transfers   Equipment Recommendations   (TBD in next venue)    Recommendations for Other Services Rehab consult;Speech consult     Precautions / Restrictions Precautions Precautions: Fall;Other (comment) Required Braces or Orthoses: Other Brace Other Brace: R resting hand splint Restrictions Weight Bearing Restrictions: No     Mobility  Bed Mobility Overal bed mobility: Needs Assistance Bed Mobility: Supine to Sit     Supine to sit: Max assist, HOB elevated     General bed mobility comments: Pt able to initiate moving BLE to EOB but requires max assist to upright trunk & fully scoot to sitting EOB    Transfers Overall transfer level: Needs assistance Equipment used: Rolling walker (2 wheels) Transfers: Sit to/from Stand, Bed to chair/wheelchair/BSC Sit to Stand: Max assist, +2 physical assistance   Step pivot transfers: Max assist, +2 physical assistance, +2 safety/equipment       General transfer comment: STS from EOB with RW: Pt requires assistance for placing RUE on RW & cuing to grasp, pt with difficulty maintaining grasp on RW 2/2 decreased elbow/shoulder strength. Pt requires cuing for sequencing, to push to standing, pt able to complete STS with max assist +2. Pt completes step pivot bed>recliner on R with max assist +2 with PT assisting with maintaining R grasp on RW, assistance for maneuvering RW & cuing for stepping. Pt requires cuing & extra time to advance feet with pt demonstrating difficulty doing so. Pt attempted additional STS from recliner but unable even with max assist 2/2 fatigue. Utilized stedy lift for recliner<>BSC with min/mod assist +2 to complete STS with stedy  device, max assist to shift pelvis anteriorly to come to full upright standing to allow therapists to place stedy lift seat.    Ambulation/Gait                   Stairs              Wheelchair Mobility    Modified Rankin (Stroke Patients Only)       Balance Overall balance assessment: Needs assistance Sitting-balance support: Feet supported, Bilateral upper extremity supported Sitting balance-Leahy Scale: Fair Sitting balance - Comments: supervision static sitting on BSC   Standing balance support: During functional activity, Bilateral upper extremity supported, Reliant on assistive device for balance Standing balance-Leahy Scale: Poor                              Cognition Arousal/Alertness: Awake/alert Behavior During Therapy: Flat affect Overall Cognitive Status: Impaired/Different from baseline Area of Impairment: Orientation, Attention, Memory, Following commands, Safety/judgement, Awareness, Problem solving                 Orientation Level: Disoriented to, Situation   Memory: Decreased short-term memory Following Commands: Follows one step commands inconsistently Safety/Judgement: Decreased awareness of safety, Decreased awareness of deficits Awareness: Intellectual Problem Solving: Slow processing, Requires verbal cues, Decreased initiation, Difficulty sequencing, Requires tactile cues General Comments: Poor motor planning        Exercises      General Comments General comments (skin integrity, edema, etc.): Pt with very poor motor planning, requires max cuing & manual faciliation to scoot to sitting EOB & recliner. Pt distracted & unaware of situation, staring at IV pole & confusd by numbers on device. Pt believes he is wearing pants when on [facility] despite education & even having pt look at himself, but still not sure that he's not wearing pants. Pt reports need to use restroom but unable to once on [facility] even with extended time. Question visual deficits but assessment also limited by impaired cognition, as pt does not turn to look/visually track towards son on R side of room despite his son speaking directly to him.       Pertinent Vitals/Pain Pain Assessment Pain Assessment: No/denies pain    Home Living                          Prior Function            PT Goals (current goals can now be found in the care plan section) Acute Rehab PT Goals Patient Stated Goal: get better, attempt to return to PLOF, d/c to ILF PT Goal Formulation: With family Time For Goal Achievement: 08/24/22 Potential to Achieve Goals: Good Progress towards PT goals: Progressing toward goals    Frequency    Min 3X/week      PT Plan Discharge plan needs to be updated    Co-evaluation PT/OT/SLP Co-Evaluation/Treatment: Yes Reason for Co-Treatment: Necessary to address cognition/behavior during functional activity;For patient/therapist safety;To address functional/ADL transfers PT goals addressed during session: Mobility/safety with mobility;Proper use of DME;Balance        AM-PAC PT "6 Clicks" Mobility   Outcome Measure  Help needed turning from your back to your side while in a flat bed without using bedrails?: A Lot Help needed moving from lying on your back to sitting on the side of a flat bed without using bedrails?: Total Help needed moving to and from a  bed to a chair (including a wheelchair)?: Total Help needed standing up from a chair using your arms (e.g., wheelchair or bedside chair)?: Total Help needed to walk in hospital room?: Total Help needed climbing 3-5 steps with a railing? : Total 6 Click Score: 7    End of Session Equipment Utilized During Treatment: Gait belt Activity Tolerance: Patient tolerated treatment well Patient left: in chair;with chair alarm set;with call bell/phone within reach;with family/visitor present Nurse Communication: Mobility status PT Visit Diagnosis: Other abnormalities of gait and mobility (R26.89);Muscle weakness (generalized) (M62.81);Unsteadiness on feet (R26.81)     Time: 2233-6122 PT Time Calculation (min) (ACUTE ONLY): 37 min  Charges:   $Therapeutic Activity: 8-22 mins                     Lavone Nian, PT, DPT 08/11/22, 9:07 AM  Waunita Schooner 08/11/2022, 9:02 AM

## 2022-08-11 NOTE — Progress Notes (Signed)
Inpatient Rehab Admissions Coordinator:  ? ?Per therapy recommendations,  patient was screened for CIR candidacy by Jeb Schloemer, MS, CCC-SLP. At this time, Pt. Appears to be a a potential candidate for CIR. I will place   order for rehab consult per protocol for full assessment. Please contact me any with questions. ? ?Jenetta Wease, MS, CCC-SLP ?Rehab Admissions Coordinator  ?[phone] (celll) ?[phone] (office) ? ?

## 2022-08-12 ENCOUNTER — Inpatient Hospital Stay (HOSPITAL_COMMUNITY): Payer: Medicare HMO

## 2022-08-12 DIAGNOSIS — R600 Localized edema: Secondary | ICD-10-CM

## 2022-08-12 DIAGNOSIS — R531 Weakness: Secondary | ICD-10-CM

## 2022-08-12 DIAGNOSIS — G9341 Metabolic encephalopathy: Secondary | ICD-10-CM | POA: Diagnosis not present

## 2022-08-12 LAB — COMPREHENSIVE METABOLIC PANEL
ALT: 73 U/L — ABNORMAL HIGH (ref 0–44)
AST: 227 U/L — ABNORMAL HIGH (ref 15–41)
Albumin: 3 g/dL — ABNORMAL LOW (ref 3.5–5.0)
Alkaline Phosphatase: 50 U/L (ref 38–126)
Anion gap: 9 (ref 5–15)
BUN: 18 mg/dL (ref 8–23)
CO2: 26 mmol/L (ref 22–32)
Calcium: 8.5 mg/dL — ABNORMAL LOW (ref 8.9–10.3)
Chloride: 101 mmol/L (ref 98–111)
Creatinine, Ser: 0.88 mg/dL (ref 0.61–1.24)
GFR, Estimated: >60 mL/min (ref >60)
Glucose, Bld: 91 mg/dL (ref 70–99)
Potassium: 3.2 mmol/L — ABNORMAL LOW (ref 3.5–5.1)
Sodium: 136 mmol/L (ref 135–145)
Total Bilirubin: 1.7 mg/dL — ABNORMAL HIGH (ref 0.3–1.2)
Total Protein: 5.7 g/dL — ABNORMAL LOW (ref 6.5–8.1)

## 2022-08-12 LAB — CK: Total CK: 5870 U/L — ABNORMAL HIGH (ref 49–397)

## 2022-08-12 MED ORDER — INFLUENZA VAC A&B SA ADJ QUAD 0.5 ML IM PRSY
0.5000 mL | PREFILLED_SYRINGE | INTRAMUSCULAR | Status: DC
  Filled 2022-08-12: qty 0.5

## 2022-08-12 MED ORDER — SODIUM CHLORIDE 0.9 % IV SOLN: INTRAVENOUS | Status: DC

## 2022-08-12 MED ORDER — QUETIAPINE FUMARATE 25 MG PO TABS
25.0000 mg | ORAL_TABLET | ORAL | Status: DC
  Administered 2022-08-12 – 2022-08-14 (×3): 25 mg via ORAL
  Filled 2022-08-12 (×3): qty 1

## 2022-08-12 MED ORDER — POTASSIUM CHLORIDE CRYS ER 20 MEQ PO TBCR
40.0000 meq | EXTENDED_RELEASE_TABLET | ORAL | Status: AC
  Administered 2022-08-12 (×2): 40 meq via ORAL
  Filled 2022-08-12 (×2): qty 2

## 2022-08-12 NOTE — Progress Notes (Addendum)
PROGRESS NOTE    YOVAN LEEMAN  ZOX:[id number] DOB: 1936/10/18 DOA: 08/09/2022 PCP: Shelda Pal, DO   Brief Narrative:  KENTA LASTER is a 86 y.o. male with medical history significant for coronary disease status post three-vessel CABG in January 2023, severe aortic stenosis status post aortic valve replacement in January 2023 status post pacemaker placement, hypertension, hyperlipidemia, who is admitted to [facility] on 08/09/2022 with suspected acute metabolic encephalopathy in the setting of rhabdomyolysis after presenting from home to [facility] ED altered mental status.  Assessment & Plan:   Principal Problem:   Acute metabolic encephalopathy Active Problems:   Rhabdomyolysis   Essential hypertension   Hypercholesterolemia   SIRS (systemic inflammatory response syndrome) (HCC)   Lactic acidosis   Weakness of right upper extremity   Elevated troponin   AKI (acute kidney injury) ([place])   Transaminitis   Dehydration  Acute metabolic encephalopathy: CT head unremarkable.  Ammonia normal.  Likely in the setting of some sort of infection or rhabdomyolysis.   Delirium: Patient was noted to have delirious symptoms last night.  This morning 2, he is alert but slightly confused compared to yesterday.  Will start on delirium precautions and Seroquel tonight.  Rhabdomyolysis: CK improving but very slowly, will resume IV fluids again.  dehydration/lactic acidosis: Lactic acidosis improving.  Continue IV fluids.  Repeat labs later today.  Fever with SIRS criteria: 101.3 at Tmax upon admission but has remained afebrile since then and no clear signs of infection with UA and chest x-ray negative as well as blood cultures have been negative for 72 hours and procalcitonin unremarkable as well.  Continue to monitor off of antibiotics which were discontinued on 08/11/2022.  Elevated troponin: Jumped from 83 yesterday to 146 and then down to 81 again.  Patient asymptomatic.   Echo shows normal ejection fraction with no wall motion abnormality.  Likely demand ischemia.  Hypomagnesemia: Resolved.  Hypokalemia: We will replace.  Elevated LFTs: Wonder if this is likely due to shock, rhabdomyolysis as well.  Left is slightly worse compared to yesterday, ultrasound abdomen negative for any liver disease, shows cholelithiasis.  Hyperlipidemia: On atorvastatin PTA but holding that due to active rhabdomyolysis.  Essential hypertension: Blood pressure controlled but on the low normal side.  Continue home dose of Lopressor but hold lisinopril.  Right-sided wrist drop and right upper extremity weakness: Likely due to radial nerve palsy as he was found to have his right arm dropped over the commode.  It is being presumed that he was like that for 24 hours.  He is going to need extended therapy.  PT OT consulted.  They recommended CIR.  CIR is following.  Chronic thrombocytopenia: Slight drop in platelets yesterday.  No signs of bleeding.  We will repeat CBC tomorrow morning.  Dysphagia: Was found to have some dysphagia while working with PT. evaluated by SLP.  Right upper extremity edema and tenderness: This could very well be due to wrapping his right arm around the toilet for 24 hours but we will go ahead and rule out DVT with Doppler ultrasound.  DVT prophylaxis: enoxaparin (LOVENOX) injection 40 mg Start: 08/10/22 0915 SCDs Start: 08/09/22 2244   Code Status: Full Code  Family Communication: none present at bedside.  Plan of care discussed with patient in length and he/she verbalized understanding and agreed with it. D/w son over the phone.  Status is: Inpatient Remains inpatient appropriate because: Still very weak with rhabdomyolysis and needs IV fluids for another 24  hours.   Estimated body mass index is 24.72 kg/m as calculated from the following:   Height as of this encounter: '5\' 11"'$  (1.803 m).   Weight as of this encounter: 80.4 kg.    Nutritional  Assessment: Body mass index is 24.72 kg/m.Marland Kitchen Seen by dietician.  I agree with the assessment and plan as outlined below: Nutrition Status:   Skin Assessment: I have examined the patient's skin and I agree with the wound assessment as performed by the wound care RN as outlined below:    Consultants:  None  Procedures:  None  Antimicrobials:  Anti-infectives (From admission, onward)    Start     Dose/Rate Route Frequency Ordered Stop   08/10/22 2200  vancomycin (VANCOREADY) IVPB 1500 mg/300 mL  Status:  Discontinued        1,500 mg 150 mL/hr over 120 Minutes Intravenous Every 24 hours 08/09/22 2153 08/11/22 1120   08/10/22 1000  ceFEPIme (MAXIPIME) 2 g in sodium chloride 0.9 % 100 mL IVPB  Status:  Discontinued        2 g 200 mL/hr over 30 Minutes Intravenous Every 12 hours 08/09/22 2152 08/11/22 1120   08/09/22 2130  ceFEPIme (MAXIPIME) 2 g in sodium chloride 0.9 % 100 mL IVPB        2 g 200 mL/hr over 30 Minutes Intravenous  Once 08/09/22 2120 08/09/22 2212   08/09/22 2130  metroNIDAZOLE (FLAGYL) IVPB 500 mg        500 mg 100 mL/hr over 60 Minutes Intravenous  Once 08/09/22 2120 08/09/22 2328   08/09/22 2130  vancomycin (VANCOCIN) IVPB 1000 mg/200 mL premix  Status:  Discontinued        1,000 mg 200 mL/hr over 60 Minutes Intravenous  Once 08/09/22 2120 08/09/22 2123   08/09/22 2130  vancomycin (VANCOREADY) IVPB 1750 mg/350 mL        1,750 mg 175 mL/hr over 120 Minutes Intravenous  Once 08/09/22 2123 08/10/22 0132         Subjective:  Patient seen and examined.  He is alert but partly oriented.  He has no complaints.  Objective: Vitals:   08/11/22 2132 08/11/22 2341 08/12/22 0338 08/12/22 0834  BP:  (!) 133/58 (!) 142/74 (!) 149/64  Pulse: 75 71 76 70  Resp:  '17 16 20  '$ Temp:  (!) 97.5 F (36.4 C) 98 F (36.7 C) 97.9 F (36.6 C)  TempSrc:  Oral Oral Axillary  SpO2:  96% 96% 98%  Weight:      Height:        Intake/Output Summary (Last 24 hours) at 08/12/2022  1041 Last data filed at 08/11/2022 2000 Gross per 24 hour  Intake 810.51 ml  Output 700 ml  Net 110.51 ml    Filed Weights   08/09/22 1956  Weight: 80.4 kg    Examination:  General exam: Appears calm and comfortable  Respiratory system: Clear to auscultation. Respiratory effort normal. Cardiovascular system: S1 & S2 heard, RRR. No JVD, murmurs, rubs, gallops or clicks. No pedal edema. Gastrointestinal system: Abdomen is nondistended, soft and nontender. No organomegaly or masses felt. Normal bowel sounds heard. Central nervous system: Alert and oriented x2.  4/5 power in right upper extremity. Extremities: Edema and tenderness of the right upper extremity.  Data Reviewed: I have personally reviewed following labs and imaging studies  CBC: Recent Labs  Lab 08/09/22 2004 08/09/22 2009 08/10/22 0400 08/11/22 0600  WBC 14.0*  --  11.1* 9.8  NEUTROABS 12.5*  --  9.3* 7.6  HGB 15.5 15.6  15.3 12.0* 10.0*  HCT 44.1 46.0  45.0 35.7* 29.4*  MCV 98.4  --  100.8* 99.7  PLT 138*  --  114* 91*    Basic Metabolic Panel: Recent Labs  Lab 08/09/22 2004 08/09/22 2009 08/09/22 2230 08/10/22 0400 08/11/22 0600 08/12/22 0558  NA 137 135  135  --  136 136 136  K 5.0 5.1  5.1  --  4.4 3.8 3.2*  CL 96* 100  --  103 103 101  CO2 24  --   --  '23 23 26  '$ GLUCOSE 172* 171*  --  148* 108* 91  BUN 19 22  --  18 26* 18  CREATININE 1.22 1.00  --  1.04 0.99 0.88  CALCIUM 9.5  --   --  8.3* 8.3* 8.5*  MG  --   --  1.7 1.6*  --   --   PHOS  --   --   --  3.8  --   --     GFR: Estimated Creatinine Clearance: 67.7 mL/min (by C-G formula based on SCr of 0.88 mg/dL). Liver Function Tests: Recent Labs  Lab 08/09/22 2004 08/10/22 0400 08/11/22 0600 08/12/22 0558  AST 132* 182* 200* 227*  ALT 37 47* 57* 73*  ALKPHOS 80 51 48 50  BILITOT 2.4* 2.5* 1.4* 1.7*  PROT 8.0 5.7* 5.4* 5.7*  ALBUMIN 4.3 3.1* 2.8* 3.0*    No results for input(s): "LIPASE", "AMYLASE" in the last 168  hours. Recent Labs  Lab 08/10/22 0400  AMMONIA 13    Coagulation Profile: Recent Labs  Lab 08/09/22 2004 08/10/22 0400  INR 1.1 1.2    Cardiac Enzymes: Recent Labs  Lab 08/09/22 2004 08/10/22 0400 08/10/22 1706 08/11/22 0600 08/12/22 0558  CKTOTAL 6,868* 9,203* 8,205* 6,408* 5,870*    BNP (last 3 results) No results for input(s): "PROBNP" in the last 8760 hours. HbA1C: No results for input(s): "HGBA1C" in the last 72 hours. CBG: No results for input(s): "GLUCAP" in the last 168 hours. Lipid Profile: No results for input(s): "CHOL", "HDL", "LDLCALC", "TRIG", "CHOLHDL", "LDLDIRECT" in the last 72 hours. Thyroid Function Tests: Recent Labs    08/10/22 0400  TSH 1.029    Anemia Panel: Recent Labs    08/10/22 0400  FERRITIN 87  TIBC 266  IRON 43*    Sepsis Labs: Recent Labs  Lab 08/09/22 2004 08/09/22 2230 08/10/22 0400 08/10/22 1706  PROCALCITON  --   --  <0.10  --   LATICACIDVEN 4.2* 3.7* 2.0* 2.5*     Recent Results (from the past 240 hour(s))  Urine Culture     Status: None   Collection Time: 08/09/22  8:59 PM   Specimen: In/Out Cath Urine  Result Value Ref Range Status   Specimen Description IN/OUT CATH URINE  Final   Special Requests NONE  Final   Culture   Final    NO GROWTH Performed at [facility], [address], [place], [place] [address]    Report Status 08/10/2022 FINAL  Final  Blood Culture (routine x 2)     Status: None (Preliminary result)   Collection Time: 08/09/22  9:00 PM   Specimen: BLOOD  Result Value Ref Range Status   Specimen Description BLOOD LEFT ARM  Final   Special Requests   Final    BOTTLES DRAWN AEROBIC AND ANAEROBIC Blood Culture results may not be optimal due to an excessive volume of blood received in  culture bottles   Culture   Final    NO GROWTH 3 DAYS Performed at [facility], [place] [address] [place], [place] [address]    Report Status PENDING  Incomplete  Blood Culture (routine x 2)      Status: None (Preliminary result)   Collection Time: 08/09/22  9:10 PM   Specimen: BLOOD RIGHT ARM  Result Value Ref Range Status   Specimen Description BLOOD RIGHT ARM  Final   Special Requests   Final    BOTTLES DRAWN AEROBIC AND ANAEROBIC Blood Culture adequate volume   Culture   Final    NO GROWTH 3 DAYS Performed at [facility], [place] [address] [place], [place] [address]    Report Status PENDING  Incomplete  SARS Coronavirus 2 by RT PCR (hospital order, performed in [facility] hospital lab) *cepheid single result test* Anterior Nasal Swab     Status: None   Collection Time: 08/09/22 10:30 PM   Specimen: Anterior Nasal Swab  Result Value Ref Range Status   SARS Coronavirus 2 by RT PCR NEGATIVE NEGATIVE Final    Comment: (NOTE) SARS-CoV-2 target nucleic acids are NOT DETECTED.  The SARS-CoV-2 RNA is generally detectable in upper and lower respiratory specimens during the acute phase of infection. The lowest concentration of SARS-CoV-2 viral copies this assay can detect is 250 copies / mL. A negative result does not preclude SARS-CoV-2 infection and should not be used as the sole basis for treatment or other patient management decisions.  A negative result may occur with improper specimen collection / handling, submission of specimen other than nasopharyngeal swab, presence of viral mutation(s) within the areas targeted by this assay, and inadequate number of viral copies (<250 copies / mL). A negative result must be combined with clinical observations, patient history, and epidemiological information.  Fact Sheet for Patients:   https://www.patel.info/  Fact Sheet for Healthcare Providers: https://hall.com/  This test is not yet approved or  cleared by the [place] FDA and has been authorized for detection and/or diagnosis of SARS-CoV-2 by FDA under an Emergency Use Authorization (EUA).  This EUA will remain in  effect (meaning this test can be used) for the duration of the COVID-19 declaration under Section 564(b)(1) of the Act, 21 U.S.C. section 360bbb-3(b)(1), unless the authorization is terminated or revoked sooner.  Performed at [facility], [place] [address], [place], [place] [address]      Radiology Studies: US Abdomen Limited RUQ (LIVER/GB)  Result Date: 08/11/2022 CLINICAL DATA:  Elevated liver function studies. EXAM: ULTRASOUND ABDOMEN LIMITED RIGHT UPPER QUADRANT COMPARISON:  None Available. FINDINGS: Gallbladder: Cholelithiasis with stones measuring up to 1 cm diameter. No wall thickening or pericholecystic edema. Murphy's sign is negative. Common bile duct: Diameter: 4 mm, normal Liver: No focal lesion identified. Within normal limits in parenchymal echogenicity. Portal vein is patent on color Doppler imaging with normal direction of blood flow towards the liver. Other: None. IMPRESSION: Cholelithiasis without additional evidence of acute cholecystitis. Electronically Signed   By: Lucienne Capers M.D.   On: 08/11/2022 22:41   ECHOCARDIOGRAM COMPLETE  Result Date: 08/10/2022    ECHOCARDIOGRAM REPORT   Patient Name:   DEANDRA GADSON Date of Exam: 08/10/2022 Medical Rec #:  [id number]        Height:       71.0 in Accession #:    [id number]       Weight:       177.2 lb Date  of Birth:  12-14-1935       BSA:          2.003 m Patient Age:    76 years         BP:           107/65 mmHg Patient Gender: M                HR:           72 bpm. Exam Location:  Inpatient Procedure: 2D Echo, Color Doppler, Cardiac Doppler and Intracardiac            Opacification Agent Indications:    Elevated troponin  History:        Patient has prior history of Echocardiogram examinations, most                 recent 12/22/2021. Pacemaker; Risk Factors:Hypertension and                 Dyslipidemia.  Sonographer:    Memory Argue Referring Phys: [id number] [place]  1. Left ventricular ejection  fraction, by estimation, is 60 to 65%. The left ventricle has normal function. The left ventricle has no regional wall motion abnormalities. There is mild concentric left ventricular hypertrophy. Left ventricular diastolic parameters are consistent with Grade I diastolic dysfunction (impaired relaxation).  2. Right ventricular systolic function was not well visualized. The right ventricular size is normal. Tricuspid regurgitation signal is inadequate for assessing PA pressure.  3. Left atrial size was mild to moderately dilated.  4. The mitral valve is degenerative. No evidence of mitral valve regurgitation. No evidence of mitral stenosis.  5. The aortic valve was not well visualized. There is moderate calcification of the aortic valve. Aortic valve regurgitation is not visualized. Moderate aortic valve stenosis. Aortic valve area, by VTI measures 1.38 cm. Aortic valve mean gradient measures 23.0 mmHg.  6. Technically difficult study with poor acoustic windows. FINDINGS  Left Ventricle: Left ventricular ejection fraction, by estimation, is 60 to 65%. The left ventricle has normal function. The left ventricle has no regional wall motion abnormalities. The left ventricular internal cavity size was normal in size. There is  mild concentric left ventricular hypertrophy. Left ventricular diastolic parameters are consistent with Grade I diastolic dysfunction (impaired relaxation). Right Ventricle: The right ventricular size is normal. Right vetricular wall thickness was not well visualized. Right ventricular systolic function was not well visualized. Tricuspid regurgitation signal is inadequate for assessing PA pressure. Left Atrium: Left atrial size was mild to moderately dilated. Right Atrium: Right atrial size was normal in size. Pericardium: There is no evidence of pericardial effusion. Mitral Valve: The mitral valve is degenerative in appearance. There is mild calcification of the mitral valve leaflet(s). Mild to  moderate mitral annular calcification. No evidence of mitral valve regurgitation. No evidence of mitral valve stenosis. Tricuspid Valve: The tricuspid valve is normal in structure. Tricuspid valve regurgitation is not demonstrated. Aortic Valve: The aortic valve was not well visualized. There is moderate calcification of the aortic valve. Aortic valve regurgitation is not visualized. Moderate aortic stenosis is present. Aortic valve mean gradient measures 23.0 mmHg. Aortic valve peak gradient measures 42.5 mmHg. Aortic valve area, by VTI measures 1.38 cm. Pulmonic Valve: The pulmonic valve was normal in structure. Pulmonic valve regurgitation is not visualized. Aorta: The aortic root is normal in size and structure. Venous: The inferior vena cava was not well visualized. IAS/Shunts: No atrial level shunt detected by color flow  Doppler.  LEFT VENTRICLE PLAX 2D LVIDd:         4.10 cm   Diastology LVIDs:         2.60 cm   LV e' medial:    5.87 cm/s LV PW:         1.30 cm   LV E/e' medial:  12.3 LV IVS:        1.30 cm   LV e' lateral:   7.83 cm/s LVOT diam:     2.20 cm   LV E/e' lateral: 9.2 LV SV:         81 LV SV Index:   40 LVOT Area:     3.80 cm  LEFT ATRIUM             Index        RIGHT ATRIUM           Index LA diam:        3.40 cm 1.70 cm/m   RA Area:     15.10 cm LA Vol (A2C):   80.2 ml 40.04 ml/m  RA Volume:   34.80 ml  17.37 ml/m LA Vol (A4C):   72.5 ml 36.19 ml/m LA Biplane Vol: 76.6 ml 38.24 ml/m  AORTIC VALVE AV Area (Vmax):    1.27 cm AV Area (Vmean):   1.27 cm AV Area (VTI):     1.38 cm AV Vmax:           326.00 cm/s AV Vmean:          213.250 cm/s AV VTI:            0.583 m AV Peak Grad:      42.5 mmHg AV Mean Grad:      23.0 mmHg LVOT Vmax:         109.00 cm/s LVOT Vmean:        71.200 cm/s LVOT VTI:          0.212 m LVOT/AV VTI ratio: 0.36  AORTA Ao Root diam: 2.30 cm Ao Asc diam:  3.00 cm MITRAL VALVE MV Area (PHT): 2.91 cm     SHUNTS MV Decel Time: 261 msec     Systemic VTI:  0.21 m MV E  velocity: 72.40 cm/s   Systemic Diam: 2.20 cm MV A velocity: 123.00 cm/s MV E/A ratio:  0.59 Dalton McleanMD Electronically signed by Franki Monte Signature Date/Time: 08/10/2022/2:02:57 PM    Final     Scheduled Meds:  enoxaparin (LOVENOX) injection  40 mg Subcutaneous Q24H   metoprolol tartrate  12.5 mg Oral BID   pantoprazole  40 mg Oral Daily   QUEtiapine  25 mg Oral Q24H   Continuous Infusions:     LOS: 3 days   Darliss Cheney, MD [facility]  08/12/2022, 10:41 AM   *Please note that this is a verbal dictation therefore any spelling or grammatical errors are due to the "[employer]" system interpretation.  Please page via [place] and do not message via secure chat for urgent patient care matters. Secure chat can be used for non urgent patient care matters.  How to contact the [facility] Attending or Consulting provider [place] or covering provider during after hours [place], for this patient?  Check the care team in [facility] and look for a) attending/consulting [facility] provider listed and b) the [facility] team listed. Page or secure chat [phone]. Log into [email or web address] and use Ken Caryl's universal password to access. If you do not have the  password, please contact the hospital operator. Locate the [facility] provider you are looking for under [facility] and page to a number that you can be directly reached. If you still have difficulty reaching the provider, please page the [facility] (Director on Call) for the Hospitalists listed on amion for assistance.

## 2022-08-12 NOTE — Progress Notes (Signed)
Mobility Specialist: Progress Note   08/12/22 1639  Mobility  Activity Stood at bedside  Level of Assistance Minimal assist, patient does 75% or more  Assistive Device Front wheel walker  Distance Ambulated (ft) 2 ft  Activity Response Tolerated fair  Mobility Referral Yes  $Mobility charge 1 Mobility   Post-Mobility: 161/72 (99) BP  Pt received in the bed and agreeable to mobility. Required minA with bed mobility as well as to stand. Pt difficult to redirect once standing but able to take side steps towards HOB. C/o increased dizziness with standing, VSS. Pt back in bed with call bell at his side. Bed alarm is on.   [place] Sylvia Kondracki Mobility Specialist Secure Chat Only

## 2022-08-12 NOTE — Plan of Care (Signed)

## 2022-08-12 NOTE — Progress Notes (Signed)
Physical Therapy Treatment Patient Details Name: Joshua [name] MRN: [id number] DOB: 01-12-1936 Today's Date: 08/12/2022   History of Present Illness Pt is an 86 y/o male who presented after a fall at home (on ground for approx 24 hr) and AMS in setting of acute metabolic encephalopathy and rhabdomyolysis. PMH: CAD, CABG (2023), aortic stenosis, PPM (2023), HTN, HLD    PT Comments    Pt with continued good progress towards acute goals this session with progression of functional mobility. R hand splint added to RW with good return with pt able to maintain hand in contact with RW throughout transfers and gait without assist. Pt requiring mod a of 2 to come to sitting EOB with verbal and tactile cues to find midline and maintain static sitting. Pt able to transfer to stand with mod a of 2 to power up and progress gait with RW and mod a of 2 to facilitate forward motion and RW proximity. Pt continues to be limited by impaired cognition but continues to make good functional gains and current plan remains appropriate to address deficits and maximize functional independence and decrease caregiver burden. Pt continues to benefit from skilled PT services to progress toward functional mobility goals.    Recommendations for follow up therapy are one component of a multi-disciplinary discharge planning process, led by the attending physician.  Recommendations may be updated based on patient status, additional functional criteria and insurance authorization.  Follow Up Recommendations  Acute inpatient rehab (3hours/day) Can patient physically be transported by private vehicle: No   Assistance Recommended at Discharge Frequent or constant Supervision/Assistance  Patient can return home with the following Two people to help with bathing/dressing/bathroom;Two people to help with walking and/or transfers   Equipment Recommendations   (TBD in next venue)    Recommendations for Other Services Rehab  consult;Speech consult     Precautions / Restrictions Precautions Precautions: Fall;Other (comment) Required Braces or Orthoses: Other Brace Other Brace: R resting hand splint Restrictions Weight Bearing Restrictions: No     Mobility  Bed Mobility Overal bed mobility: Needs Assistance Bed Mobility: Supine to Sit     Supine to sit: Max assist, HOB elevated     General bed mobility comments: Pt able to initiate moving BLE to EOB but requires max assist to upright trunk & fully scoot to sitting EOB    Transfers Overall transfer level: Needs assistance Equipment used: Rolling walker (2 wheels) Transfers: Sit to/from Stand, Bed to chair/wheelchair/BSC Sit to Stand: Mod assist           General transfer comment: STS from EOB with RW: Pt requires assistance for placing RUE on RW & cuing to grasp, added walker splint on R on RW with good return, Pt requires cuing for sequencing, to push to standing, pt able to complete STS with mod assist +2. once standing cues for anterior weight shift and to bring hips forward as pt with posterior lean    Ambulation/Gait Ambulation/Gait assistance: Mod assist, +2 physical assistance, +2 safety/equipment Gait Distance (Feet): 50 Feet Assistive device: Rolling walker (2 wheels) Gait Pattern/deviations: Step-through pattern, Decreased stride length, Trunk flexed Gait velocity: decr   Pre-gait activities: standing marching in place x30 seconds General Gait Details: +2 mod assist to facilitate forward motion and upright posture, pt needing tactile cues to keep hips forward  as pt with tendency to flex trunk and push RW out too far in front. cues throughout for RW proximity, pt able to maintain R hand on RW throughout  with added walker splint, chair follow for safety   Stairs             Wheelchair Mobility    Modified Rankin (Stroke Patients Only)       Balance Overall balance assessment: Needs assistance Sitting-balance support:  Feet supported, Bilateral upper extremity supported Sitting balance-Leahy Scale: Fair Sitting balance - Comments: tactile and verbal cues once to maintain static sitting at EOB Postural control: Posterior lean Standing balance support: During functional activity, Bilateral upper extremity supported, Reliant on assistive device for balance Standing balance-Leahy Scale: Poor Standing balance comment: heavy reliance on of BUE on RW                            Cognition Arousal/Alertness: Awake/alert Behavior During Therapy: Flat affect Overall Cognitive Status: Impaired/Different from baseline Area of Impairment: Orientation, Attention, Memory, Following commands, Safety/judgement, Awareness, Problem solving                 Orientation Level: Disoriented to, Situation Current Attention Level: Sustained Memory: Decreased short-term memory Following Commands: Follows one step commands inconsistently Safety/Judgement: Decreased awareness of safety, Decreased awareness of deficits Awareness: Intellectual Problem Solving: Slow processing, Requires verbal cues, Decreased initiation, Difficulty sequencing, Requires tactile cues General Comments: Poor motor planning, able to follow commands however worsens with fatigue        Exercises      General Comments        Pertinent Vitals/Pain Pain Assessment Pain Assessment: Faces Faces Pain Scale: Hurts little more Pain Location: R hand and UE Pain Descriptors / Indicators: Grimacing, Guarding Pain Intervention(s): Monitored during session, Limited activity within patient's tolerance, Repositioned    Home Living                          Prior Function            PT Goals (current goals can now be found in the care plan section) Acute Rehab PT Goals PT Goal Formulation: With family Time For Goal Achievement: 08/24/22    Frequency    Min 3X/week      PT Plan      Co-evaluation               AM-PAC PT "6 Clicks" Mobility   Outcome Measure  Help needed turning from your back to your side while in a flat bed without using bedrails?: A Lot Help needed moving from lying on your back to sitting on the side of a flat bed without using bedrails?: Total Help needed moving to and from a bed to a chair (including a wheelchair)?: Total Help needed standing up from a chair using your arms (e.g., wheelchair or bedside chair)?: A Lot Help needed to walk in hospital room?: A Lot Help needed climbing 3-5 steps with a railing? : Total 6 Click Score: 9    End of Session Equipment Utilized During Treatment: Gait belt Activity Tolerance: Patient tolerated treatment well Patient left: in chair;with chair alarm set;with call bell/phone within reach Nurse Communication: Mobility status PT Visit Diagnosis: Other abnormalities of gait and mobility (R26.89);Muscle weakness (generalized) (M62.81);Unsteadiness on feet (R26.81)     Time: 7867-6720 PT Time Calculation (min) (ACUTE ONLY): 25 min  Charges:  $Gait Training: 8-22 mins $Therapeutic Activity: 8-22 mins                     Lasalle Abee R. PTA  Acute Rehabilitation Services Office: [phone]    Rutherford Limerick 08/12/2022, 12:10 PM

## 2022-08-12 NOTE — Progress Notes (Signed)
Inpatient Rehabilitation Admissions Coordinator   Insurance has denied [facility] admit. I spoke with his son, [name], and he is aware. He would like to pursue SNF. I have alerted acute team and TOC. We will sign off.  Danne Baxter, RN, MSN Rehab Admissions Coordinator [phone] 08/12/2022 4:17 PM

## 2022-08-12 NOTE — Plan of Care (Signed)
  Problem: Education: Goal: Knowledge of General Education information will improve Description: Including pain rating scale, medication(s)/side effects and non-pharmacologic comfort measures 08/12/2022 0037 by Arlyce Harman, RN Outcome: Progressing 08/12/2022 0036 by Arlyce Harman, RN Outcome: Progressing   Problem: Health Behavior/Discharge Planning: Goal: Ability to manage health-related needs will improve 08/12/2022 0037 by Arlyce Harman, RN Outcome: Progressing 08/12/2022 0036 by Arlyce Harman, RN Outcome: Progressing   Problem: Clinical Measurements: Goal: Ability to maintain clinical measurements within normal limits will improve 08/12/2022 0037 by Arlyce Harman, RN Outcome: Progressing 08/12/2022 0036 by Arlyce Harman, RN Outcome: Progressing Goal: Will remain free from infection 08/12/2022 0037 by Arlyce Harman, RN Outcome: Progressing 08/12/2022 0036 by Arlyce Harman, RN Outcome: Progressing Goal: Diagnostic test results will improve 08/12/2022 0037 by Arlyce Harman, RN Outcome: Progressing 08/12/2022 0036 by Arlyce Harman, RN Outcome: Progressing Goal: Respiratory complications will improve 08/12/2022 0037 by Arlyce Harman, RN Outcome: Progressing 08/12/2022 0036 by Arlyce Harman, RN Outcome: Progressing Goal: Cardiovascular complication will be avoided 08/12/2022 0037 by Arlyce Harman, RN Outcome: Progressing 08/12/2022 0036 by Arlyce Harman, RN Outcome: Progressing   Problem: Activity: Goal: Risk for activity intolerance will decrease 08/12/2022 0037 by Arlyce Harman, RN Outcome: Progressing 08/12/2022 0036 by Arlyce Harman, RN Outcome: Progressing   Problem: Nutrition: Goal: Adequate nutrition will be maintained 08/12/2022 0037 by Arlyce Harman, RN Outcome: Progressing 08/12/2022 0036 by Arlyce Harman, RN Outcome: Progressing   Problem: Coping: Goal: Level of anxiety will  decrease 08/12/2022 0037 by Arlyce Harman, RN Outcome: Progressing 08/12/2022 0036 by Arlyce Harman, RN Outcome: Progressing   Problem: Elimination: Goal: Will not experience complications related to bowel motility 08/12/2022 0037 by Arlyce Harman, RN Outcome: Progressing 08/12/2022 0036 by Arlyce Harman, RN Outcome: Progressing Goal: Will not experience complications related to urinary retention 08/12/2022 0037 by Arlyce Harman, RN Outcome: Progressing 08/12/2022 0036 by Arlyce Harman, RN Outcome: Progressing   Problem: Pain Managment: Goal: General experience of comfort will improve 08/12/2022 0037 by Arlyce Harman, RN Outcome: Progressing 08/12/2022 0036 by Arlyce Harman, RN Outcome: Progressing   Problem: Safety: Goal: Ability to remain free from injury will improve 08/12/2022 0037 by Arlyce Harman, RN Outcome: Progressing 08/12/2022 0036 by Arlyce Harman, RN Outcome: Progressing   Problem: Skin Integrity: Goal: Risk for impaired skin integrity will decrease 08/12/2022 0037 by Arlyce Harman, RN Outcome: Progressing 08/12/2022 0036 by Arlyce Harman, RN Outcome: Progressing

## 2022-08-13 DIAGNOSIS — G9341 Metabolic encephalopathy: Secondary | ICD-10-CM | POA: Diagnosis not present

## 2022-08-13 LAB — BASIC METABOLIC PANEL
Anion gap: 12 (ref 5–15)
BUN: 15 mg/dL (ref 8–23)
CO2: 21 mmol/L — ABNORMAL LOW (ref 22–32)
Calcium: 8.6 mg/dL — ABNORMAL LOW (ref 8.9–10.3)
Chloride: 101 mmol/L (ref 98–111)
Creatinine, Ser: 0.96 mg/dL (ref 0.61–1.24)
GFR, Estimated: >60 mL/min (ref >60)
Glucose, Bld: 74 mg/dL (ref 70–99)
Potassium: 4.1 mmol/L (ref 3.5–5.1)
Sodium: 134 mmol/L — ABNORMAL LOW (ref 135–145)

## 2022-08-13 LAB — LACTIC ACID, PLASMA: Lactic Acid, Venous: 1.1 mmol/L (ref 0.5–1.9)

## 2022-08-13 LAB — MAGNESIUM: Magnesium: 1.8 mg/dL (ref 1.7–2.4)

## 2022-08-13 LAB — CK: Total CK: 4093 U/L — ABNORMAL HIGH (ref 49–397)

## 2022-08-13 MED ORDER — SODIUM CHLORIDE 0.9 % IV SOLN: INTRAVENOUS | Status: AC

## 2022-08-13 NOTE — Progress Notes (Signed)
PROGRESS NOTE    Joshua [name]  KYH:[id number] DOB: Aug 13, 1936 DOA: 08/09/2022 PCP: Shelda Pal, DO   Brief Narrative:  Joshua [name] is a 86 y.o. male with medical history significant for coronary disease status post three-vessel CABG in January 2023, severe aortic stenosis status post aortic valve replacement in January 2023 status post pacemaker placement, hypertension, hyperlipidemia, who is admitted to [facility] on 08/09/2022 with suspected acute metabolic encephalopathy in the setting of rhabdomyolysis after presenting from home to [facility] ED altered mental status.  Assessment & Plan:   Principal Problem:   Acute metabolic encephalopathy Active Problems:   Rhabdomyolysis   Essential hypertension   Hypercholesterolemia   SIRS (systemic inflammatory response syndrome) (HCC)   Lactic acidosis   Weakness of right upper extremity   Elevated troponin   AKI (acute kidney injury) ([place])   Transaminitis   Dehydration  Acute metabolic encephalopathy: CT head unremarkable.  Ammonia normal.  Likely in the setting of some sort of infection or rhabdomyolysis.   Delirium: Slightly confused but improved compared to yesterday.  Has had mittens.  We will continue Seroquel tonight with delirium precautions.   1. Avoid benzodiazepines, antihistamines, anticholinergics, and minimize opiate use as these may worsen delirium. 2: Assess, prevent and manage pain as lack of treatment can result in delirium.  3: Provide appropriate lighting and clear signage; a clock and calendar should be easily visible to the patient. 4: Monitor environmental factors. Reduce light and noise at night (close shades, turn off lights, turn off TV, ect). Correct any alterations in sleep cycle. 5: Reorient the patient to person, place, time and situation on each encounter.  6: Correct sensory deficits if possible (replace eye glasses, hearing aids, ect). 7: Avoid restraints if able. Severely  delirious patients benefit from constant observation by a sitter.  Rhabdomyolysis: CK improving but very slowly, will continue IV fluids for another 24 hours.  dehydration/lactic acidosis: Lactic acidosis improving.  Continue IV fluids.  Repeat lactic acid later today.  Fever with SIRS criteria: 101.3 at Tmax upon admission but has remained afebrile since then and no clear signs of infection with UA and chest x-ray negative as well as blood cultures have been negative for 72 hours and procalcitonin unremarkable as well.  Continue to monitor off of antibiotics which were discontinued on 08/11/2022.  Elevated troponin: Jumped from 83 yesterday to 146 and then down to 81 again.  Patient asymptomatic.  Echo shows normal ejection fraction with no wall motion abnormality.  Likely demand ischemia.  Hypomagnesemia: Resolved.  Hypokalemia: Resolved.  Elevated LFTs/shock liver: Wonder if this is likely due to shock, rhabdomyolysis as well.  Left is slightly worse compared to yesterday, ultrasound abdomen negative for any liver disease, shows cholelithiasis.  We will repeat CMP tomorrow morning.  Hyperlipidemia: On atorvastatin PTA but holding that due to active rhabdomyolysis.  Essential hypertension: Blood pressure controlled but on the low normal side.  Continue home dose of Lopressor but hold lisinopril.  Right-sided wrist drop and right upper extremity weakness: Likely due to radial nerve palsy as he was found to have his right arm dropped over the commode.  It is being presumed that he was like that for 24 hours.  He is going to need extended therapy.  PT OT consulted.  They recommended CIR however insurance declined.  Now pursuing SNF.  Chronic thrombocytopenia: Slight drop in platelets day before yesterday.  No signs of bleeding.  We will repeat CBC tomorrow morning.  Dysphagia:  Was found to have some dysphagia while working with PT. evaluated by SLP.  No dysphagia on evaluation.  Right upper  extremity edema and tenderness/superficial vein thrombosis right cephalic vein: Swelling improved.  Patient advised to keep right upper extremity elevated.  Apply cold compresses.  DVT prophylaxis: enoxaparin (LOVENOX) injection 40 mg Start: 08/10/22 0915 SCDs Start: 08/09/22 2244   Code Status: Full Code  Family Communication: Son present at bedside.  Plan of care discussed with him in detail.  Status is: Inpatient Remains inpatient appropriate because: Improving.  Hopefully will be medically ready for discharge tomorrow.  TOC to work with family to find SNF.   Estimated body mass index is 21.71 kg/m as calculated from the following:   Height as of this encounter: '5\' 11"'$  (1.803 m).   Weight as of this encounter: 70.6 kg.    Nutritional Assessment: Body mass index is 21.71 kg/m.Marland Kitchen Seen by dietician.  I agree with the assessment and plan as outlined below: Nutrition Status:   Skin Assessment: I have examined the patient's skin and I agree with the wound assessment as performed by the wound care RN as outlined below:    Consultants:  None  Procedures:  None  Antimicrobials:  Anti-infectives (From admission, onward)    Start     Dose/Rate Route Frequency Ordered Stop   08/10/22 2200  vancomycin (VANCOREADY) IVPB 1500 mg/300 mL  Status:  Discontinued        1,500 mg 150 mL/hr over 120 Minutes Intravenous Every 24 hours 08/09/22 2153 08/11/22 1120   08/10/22 1000  ceFEPIme (MAXIPIME) 2 g in sodium chloride 0.9 % 100 mL IVPB  Status:  Discontinued        2 g 200 mL/hr over 30 Minutes Intravenous Every 12 hours 08/09/22 2152 08/11/22 1120   08/09/22 2130  ceFEPIme (MAXIPIME) 2 g in sodium chloride 0.9 % 100 mL IVPB        2 g 200 mL/hr over 30 Minutes Intravenous  Once 08/09/22 2120 08/09/22 2212   08/09/22 2130  metroNIDAZOLE (FLAGYL) IVPB 500 mg        500 mg 100 mL/hr over 60 Minutes Intravenous  Once 08/09/22 2120 08/09/22 2328   08/09/22 2130  vancomycin (VANCOCIN) IVPB  1000 mg/200 mL premix  Status:  Discontinued        1,000 mg 200 mL/hr over 60 Minutes Intravenous  Once 08/09/22 2120 08/09/22 2123   08/09/22 2130  vancomycin (VANCOREADY) IVPB 1750 mg/350 mL        1,750 mg 175 mL/hr over 120 Minutes Intravenous  Once 08/09/22 2123 08/10/22 0132         Subjective:  Seen and examined.  Alert and still slightly confused but better than yesterday.  Son at the bedside.  Patient has no complaints.  Objective: Vitals:   08/12/22 2318 08/13/22 0340 08/13/22 0500 08/13/22 0746  BP: (!) 145/73 (!) 144/68  138/75  Pulse: 71 79  78  Resp: '16 16  19  '$ Temp: 99.1 F (37.3 C) 98 F (36.7 C)  99.2 F (37.3 C)  TempSrc: Oral Oral  Axillary  SpO2:    98%  Weight:   70.6 kg   Height:        Intake/Output Summary (Last 24 hours) at 08/13/2022 1022 Last data filed at 08/13/2022 0527 Gross per 24 hour  Intake 1713.58 ml  Output 1050 ml  Net 663.58 ml    Filed Weights   08/09/22 1956 08/13/22 0500  Weight: 80.4  kg 70.6 kg    Examination:  General exam: Appears calm and comfortable  Respiratory system: Clear to auscultation. Respiratory effort normal. Cardiovascular system: S1 & S2 heard, RRR. No JVD, murmurs, rubs, gallops or clicks. No pedal edema. Gastrointestinal system: Abdomen is nondistended, soft and nontender. No organomegaly or masses felt. Normal bowel sounds heard. Central nervous system: Alert and oriented x3.  4/5 power in right upper extremity. Extremities: Edema and tenderness of the right upper extremity.  Data Reviewed: I have personally reviewed following labs and imaging studies  CBC: Recent Labs  Lab 08/09/22 2004 08/09/22 2009 08/10/22 0400 08/11/22 0600  WBC 14.0*  --  11.1* 9.8  NEUTROABS 12.5*  --  9.3* 7.6  HGB 15.5 15.6  15.3 12.0* 10.0*  HCT 44.1 46.0  45.0 35.7* 29.4*  MCV 98.4  --  100.8* 99.7  PLT 138*  --  114* 91*    Basic Metabolic Panel: Recent Labs  Lab 08/09/22 2004 08/09/22 2009  08/09/22 2230 08/10/22 0400 08/11/22 0600 08/12/22 0558 08/13/22 0510  NA 137 135  135  --  136 136 136 134*  K 5.0 5.1  5.1  --  4.4 3.8 3.2* 4.1  CL 96* 100  --  103 103 101 101  CO2 24  --   --  '23 23 26 '$ 21*  GLUCOSE 172* 171*  --  148* 108* 91 74  BUN 19 22  --  18 26* 18 15  CREATININE 1.22 1.00  --  1.04 0.99 0.88 0.96  CALCIUM 9.5  --   --  8.3* 8.3* 8.5* 8.6*  MG  --   --  1.7 1.6*  --   --  1.8  PHOS  --   --   --  3.8  --   --   --     GFR: Estimated Creatinine Clearance: 58.2 mL/min (by C-G formula based on SCr of 0.96 mg/dL). Liver Function Tests: Recent Labs  Lab 08/09/22 2004 08/10/22 0400 08/11/22 0600 08/12/22 0558  AST 132* 182* 200* 227*  ALT 37 47* 57* 73*  ALKPHOS 80 51 48 50  BILITOT 2.4* 2.5* 1.4* 1.7*  PROT 8.0 5.7* 5.4* 5.7*  ALBUMIN 4.3 3.1* 2.8* 3.0*    No results for input(s): "LIPASE", "AMYLASE" in the last 168 hours. Recent Labs  Lab 08/10/22 0400  AMMONIA 13    Coagulation Profile: Recent Labs  Lab 08/09/22 2004 08/10/22 0400  INR 1.1 1.2    Cardiac Enzymes: Recent Labs  Lab 08/10/22 0400 08/10/22 1706 08/11/22 0600 08/12/22 0558 08/13/22 0510  CKTOTAL 9,203* 8,205* 6,408* 5,870* 4,093*    BNP (last 3 results) No results for input(s): "PROBNP" in the last 8760 hours. HbA1C: No results for input(s): "HGBA1C" in the last 72 hours. CBG: No results for input(s): "GLUCAP" in the last 168 hours. Lipid Profile: No results for input(s): "CHOL", "HDL", "LDLCALC", "TRIG", "CHOLHDL", "LDLDIRECT" in the last 72 hours. Thyroid Function Tests: No results for input(s): "TSH", "T4TOTAL", "FREET4", "T3FREE", "THYROIDAB" in the last 72 hours.  Anemia Panel: No results for input(s): "VITAMINB12", "FOLATE", "FERRITIN", "TIBC", "IRON", "RETICCTPCT" in the last 72 hours.  Sepsis Labs: Recent Labs  Lab 08/09/22 2004 08/09/22 2230 08/10/22 0400 08/10/22 1706  PROCALCITON  --   --  <0.10  --   LATICACIDVEN 4.2* 3.7* 2.0* 2.5*      Recent Results (from the past 240 hour(s))  Urine Culture     Status: None   Collection Time: 08/09/22  8:59  PM   Specimen: In/Out Cath Urine  Result Value Ref Range Status   Specimen Description IN/OUT CATH URINE  Final   Special Requests NONE  Final   Culture   Final    NO GROWTH Performed at [facility], [address], [place], [place] [address]    Report Status 08/10/2022 FINAL  Final  Blood Culture (routine x 2)     Status: None (Preliminary result)   Collection Time: 08/09/22  9:00 PM   Specimen: BLOOD  Result Value Ref Range Status   Specimen Description BLOOD LEFT ARM  Final   Special Requests   Final    BOTTLES DRAWN AEROBIC AND ANAEROBIC Blood Culture results may not be optimal due to an excessive volume of blood received in culture bottles   Culture   Final    NO GROWTH 4 DAYS Performed at [facility], [place] [address], [place], [place] [address]    Report Status PENDING  Incomplete  Blood Culture (routine x 2)     Status: None (Preliminary result)   Collection Time: 08/09/22  9:10 PM   Specimen: BLOOD RIGHT ARM  Result Value Ref Range Status   Specimen Description BLOOD RIGHT ARM  Final   Special Requests   Final    BOTTLES DRAWN AEROBIC AND ANAEROBIC Blood Culture adequate volume   Culture   Final    NO GROWTH 4 DAYS Performed at [facility], [place] [address] [place], [place] [address]    Report Status PENDING  Incomplete  SARS Coronavirus 2 by RT PCR (hospital order, performed in [facility] hospital lab) *cepheid single result test* Anterior Nasal Swab     Status: None   Collection Time: 08/09/22 10:30 PM   Specimen: Anterior Nasal Swab  Result Value Ref Range Status   SARS Coronavirus 2 by RT PCR NEGATIVE NEGATIVE Final    Comment: (NOTE) SARS-CoV-2 target nucleic acids are NOT DETECTED.  The SARS-CoV-2 RNA is generally detectable in upper and lower respiratory specimens during the acute phase of infection. The  lowest concentration of SARS-CoV-2 viral copies this assay can detect is 250 copies / mL. A negative result does not preclude SARS-CoV-2 infection and should not be used as the sole basis for treatment or other patient management decisions.  A negative result may occur with improper specimen collection / handling, submission of specimen other than nasopharyngeal swab, presence of viral mutation(s) within the areas targeted by this assay, and inadequate number of viral copies (<250 copies / mL). A negative result must be combined with clinical observations, patient history, and epidemiological information.  Fact Sheet for Patients:   https://www.patel.info/  Fact Sheet for Healthcare Providers: https://hall.com/  This test is not yet approved or  cleared by the [place] FDA and has been authorized for detection and/or diagnosis of SARS-CoV-2 by FDA under an Emergency Use Authorization (EUA).  This EUA will remain in effect (meaning this test can be used) for the duration of the COVID-19 declaration under Section 564(b)(1) of the Act, 21 U.S.C. section 360bbb-3(b)(1), unless the authorization is terminated or revoked sooner.  Performed at [facility], [place] [address] [place],  [address]      Radiology Studies: VAS [place] UPPER EXTREMITY VENOUS DUPLEX  Result Date: 08/12/2022 UPPER VENOUS STUDY  Patient Name:  Joshua [name]  Date of Exam:   08/12/2022 Medical Rec #: [id number]         Accession #:  [id number] Date of Birth: [birth year]        Patient Gender: M Patient Age:   66 years Exam Location:  [facility] Procedure:      VAS [place] UPPER EXTREMITY VENOUS DUPLEX Referring Phys: Brain Honeycutt --------------------------------------------------------------------------------  Indications: Right arm edema, weakness in extremity s/p fall Limitations: Extensive subcutaneous edema, open axilla wound, bandaging. Comparison  Study: No prior studies. Performing Technologist: Darlin Coco RDMS, RVT  Examination Guidelines: A complete evaluation includes B-mode imaging, spectral Doppler, color Doppler, and power Doppler as needed of all accessible portions of each vessel. Bilateral testing is considered an integral part of a complete examination. Limited examinations for reoccurring indications may be performed as noted.  Right Findings: +----------+------------+---------+-----------+-----------------------+-------+ RIGHT     CompressiblePhasicitySpontaneous      Properties       Summary +----------+------------+---------+-----------+-----------------------+-------+ IJV           Full       Yes       Yes                                   +----------+------------+---------+-----------+-----------------------+-------+ Subclavian               Yes       Yes                                   +----------+------------+---------+-----------+-----------------------+-------+ Axillary                 Yes       Yes    Patent by color doppler        +----------+------------+---------+-----------+-----------------------+-------+ Brachial      Full                           High bifurcation            +----------+------------+---------+-----------+-----------------------+-------+ Radial        Full                                                       +----------+------------+---------+-----------+-----------------------+-------+ Ulnar         Full                                                       +----------+------------+---------+-----------+-----------------------+-------+ Cephalic    Partial      Yes       Yes                            Acute  +----------+------------+---------+-----------+-----------------------+-------+ Basilic       Full                                                       +----------+------------+---------+-----------+-----------------------+-------+  Left  Findings: +----------+------------+---------+-----------+----------+-------+ LEFT  CompressiblePhasicitySpontaneousPropertiesSummary +----------+------------+---------+-----------+----------+-------+ Subclavian               Yes       Yes                      +----------+------------+---------+-----------+----------+-------+  Summary:  Right: No evidence of deep vein thrombosis in the upper extremity. Findings consistent with acute superficial vein thrombosis involving the right cephalic vein.  Left: No evidence of thrombosis in the subclavian.  *See table(s) above for measurements and observations.  Diagnosing physician: Harold Barban MD Electronically signed by Harold Barban MD on 08/12/2022 at 10:39:08 PM.    Final    US Abdomen Limited RUQ (LIVER/GB)  Result Date: 08/11/2022 CLINICAL DATA:  Elevated liver function studies. EXAM: ULTRASOUND ABDOMEN LIMITED RIGHT UPPER QUADRANT COMPARISON:  None Available. FINDINGS: Gallbladder: Cholelithiasis with stones measuring up to 1 cm diameter. No wall thickening or pericholecystic edema. Murphy's sign is negative. Common bile duct: Diameter: 4 mm, normal Liver: No focal lesion identified. Within normal limits in parenchymal echogenicity. Portal vein is patent on color Doppler imaging with normal direction of blood flow towards the liver. Other: None. IMPRESSION: Cholelithiasis without additional evidence of acute cholecystitis. Electronically Signed   By: Lucienne Capers M.D.   On: 08/11/2022 22:41    Scheduled Meds:  enoxaparin (LOVENOX) injection  40 mg Subcutaneous Q24H   influenza vaccine adjuvanted  0.5 mL Intramuscular Tomorrow-1000   metoprolol tartrate  12.5 mg Oral BID   pantoprazole  40 mg Oral Daily   QUEtiapine  25 mg Oral Q24H   Continuous Infusions:  sodium chloride        LOS: 4 days   Darliss Cheney, MD [facility]  08/13/2022, 10:22 AM   *Please note that this is a verbal dictation therefore any spelling  or grammatical errors are due to the "Eva One" system interpretation.  Please page via [place] and do not message via secure chat for urgent patient care matters. Secure chat can be used for non urgent patient care matters.  How to contact the [facility] Attending or Consulting provider [place] or covering provider during after hours [place], for this patient?  Check the care team in [facility] and look for a) attending/consulting [facility] provider listed and b) the [facility] team listed. Page or secure chat [phone]. Log into [email or web address] and use [place] universal password to access. If you do not have the password, please contact the hospital operator. Locate the [facility] provider you are looking for under [facility] and page to a number that you can be directly reached. If you still have difficulty reaching the provider, please page the [facility] (Director on Call) for the Hospitalists listed on amion for assistance.

## 2022-08-13 NOTE — Progress Notes (Signed)
Physical Therapy Treatment Patient Details Name: Joshua [name] MRN: [id number] DOB: [birth year] Today's Date: 08/13/2022   History of Present Illness Pt is an 86 y/o male who presented after a fall at home (on ground for approx 24 hr) and AMS in setting of acute metabolic encephalopathy and rhabdomyolysis. PMH: CAD, CABG (2023), aortic stenosis, PPM (2023), HTN, HLD    PT Comments    Oriented today but still intermittently confused. Slow processing, and min assist required with bed mobility, transfers, and gait training. Improved RW control today working on turns and walker placement/proximity to AD. Patient will continue to benefit from skilled physical therapy services to further improve independence with functional mobility.    Recommendations for follow up therapy are one component of a multi-disciplinary discharge planning process, led by the attending physician.  Recommendations may be updated based on patient status, additional functional criteria and insurance authorization.  Follow Up Recommendations  Acute inpatient rehab (3hours/day) Can patient physically be transported by private vehicle: Yes   Assistance Recommended at Discharge Frequent or constant Supervision/Assistance  Patient can return home with the following Two people to help with bathing/dressing/bathroom;Two people to help with walking and/or transfers   Equipment Recommendations   (TBD in next venue)    Recommendations for Other Services Rehab consult;Speech consult     Precautions / Restrictions Precautions Precautions: Fall Required Braces or Orthoses: Other Brace Other Brace: R resting hand splint Restrictions Weight Bearing Restrictions: No     Mobility  Bed Mobility Overal bed mobility: Needs Assistance Bed Mobility: Supine to Sit     Supine to sit: HOB elevated, Min assist     General bed mobility comments: Min assist for trunk support to rise to EOB. Cues throughout.     Transfers Overall transfer level: Needs assistance Equipment used: Rolling walker (2 wheels) Transfers: Sit to/from Stand Sit to Stand: Min assist   Step pivot transfers: Min assist       General transfer comment: Min assist for boost to stand from bed. Cues for technique, and some assist to align RUE onto trough of RW.    Ambulation/Gait Ambulation/Gait assistance: Min assist Gait Distance (Feet): 70 Feet Assistive device: Rolling walker (2 wheels) Gait Pattern/deviations: Step-through pattern, Decreased stride length, Trunk flexed, Drifts right/left Gait velocity: decr   Pre-gait activities: Marching in place General Gait Details: Min assist for RW control intermittently, VC for sequencing and direction changes. Required min asssist for balance on one occasions, mostly able to self correct but still some external support required.   Stairs             Wheelchair Mobility    Modified Rankin (Stroke Patients Only)       Balance Overall balance assessment: Needs assistance Sitting-balance support: Feet supported, No upper extremity supported Sitting balance-Leahy Scale: Fair     Standing balance support: Reliant on assistive device for balance, Single extremity supported Standing balance-Leahy Scale: Poor Standing balance comment: Moderate reliance on RW                            Cognition Arousal/Alertness: Awake/alert Behavior During Therapy: Flat affect Overall Cognitive Status: Impaired/Different from baseline Area of Impairment: Attention, Following commands, Safety/judgement, Awareness, Problem solving                   Current Attention Level: Sustained   Following Commands: Follows one step commands consistently, Follows one step commands with increased time Safety/Judgement:  Decreased awareness of safety, Decreased awareness of deficits Awareness: Emergent Problem Solving: Slow processing, Requires verbal cues, Decreased  initiation, Difficulty sequencing General Comments: Oriented today but requires a littl extra time to answer questions and recall situation and month        Exercises      General Comments General comments (skin integrity, edema, etc.): Pleasantly confused. Perseverating on his name being spelled wrong on IV pole, however name is not written on IV pole.      Pertinent Vitals/Pain Pain Assessment Pain Assessment: No/denies pain Pain Intervention(s): Monitored during session, Repositioned    Home Living                          Prior Function            PT Goals (current goals can now be found in the care plan section) Acute Rehab PT Goals Patient Stated Goal: get better, attempt to return to PLOF, d/c to ILF PT Goal Formulation: With family Time For Goal Achievement: 08/24/22 Potential to Achieve Goals: Good Progress towards PT goals: Progressing toward goals    Frequency    Min 3X/week      PT Plan Current plan remains appropriate    Co-evaluation              AM-PAC PT "6 Clicks" Mobility   Outcome Measure  Help needed turning from your back to your side while in a flat bed without using bedrails?: A Little Help needed moving from lying on your back to sitting on the side of a flat bed without using bedrails?: A Little Help needed moving to and from a bed to a chair (including a wheelchair)?: A Little Help needed standing up from a chair using your arms (e.g., wheelchair or bedside chair)?: A Lot Help needed to walk in hospital room?: A Lot Help needed climbing 3-5 steps with a railing? : Total 6 Click Score: 14    End of Session Equipment Utilized During Treatment: Gait belt Activity Tolerance: Patient tolerated treatment well Patient left: in chair;with chair alarm set;with call bell/phone within reach;with restraints reapplied (Mitts in place) Nurse Communication: Mobility status PT Visit Diagnosis: Other abnormalities of gait and  mobility (R26.89);Muscle weakness (generalized) (M62.81);Unsteadiness on feet (R26.81)     Time: 1275-1700 PT Time Calculation (min) (ACUTE ONLY): 34 min  Charges:  $Gait Training: 8-22 mins $Therapeutic Activity: 8-22 mins                     Candie Mile, PT, DPT Physical Therapist Acute Rehabilitation Services [place] 08/13/2022, 11:40 AM

## 2022-08-13 NOTE — TOC Initial Note (Signed)
Transition of Care [facility]) - Initial/Assessment Note    Patient Details  Name: Joshua [name] MRN: [id number] Date of Birth: 11/26/1935  [facility] CM/SW Contact:    Coralee Pesa, [place] Phone Number: 08/13/2022, 3:00 PM  Clinical Narrative:                 CSW noted pt is disoriented and spoke with son, [name] to discuss SNF. [name] is aware pt has been denied for CIR from the insurance, and is agreeable to SNF placement. [name] states pt was at [facility], and that would be their first choice, he is agreeable to a further fax out. Son confirms pt was supposed to move into [facility], prior to hospitalization. The plan is for pt to go to SNF and then discharge to [place]. All questions answered. Referral's have been faxed out and FL2 completed. TOC will continue to follow for DC needs.  Expected Discharge Plan: Skilled Nursing Facility Barriers to Discharge: Ship broker, Continued Medical Work up, SNF Pending bed offer   Patient Goals and CMS Choice Patient states their goals for this hospitalization and ongoing recovery are:: Pt disoriented and unable to participate in goal setting. CMS Medicare.gov Compare Post Acute Care list provided to:: Patient Represenative (must comment) (Son, [name]) Choice offered to / list presented to : Adult Children  Expected Discharge Plan and Services Expected Discharge Plan: [place] Acute Care Choice: [place] arrangements for the past 2 months: Single Family Home                                      Prior Living Arrangements/Services Living arrangements for the past 2 months: Single Family Home Lives with:: Adult Children Patient language and need for interpreter reviewed:: Yes Do you feel safe going back to the place where you live?: Yes      Need for Family Participation in Patient Care: Yes (Comment) Care giver support system in place?: Yes (comment)    Criminal Activity/Legal Involvement Pertinent to Current Situation/Hospitalization: No - Comment as needed  Activities of Daily Living Home Assistive Devices/Equipment: Bedside commode/3-in-1, Cane (specify quad or straight), Walker (specify type) ADL Screening (condition at time of admission) Patient's cognitive ability adequate to safely complete daily activities?: Yes Is the patient deaf or have difficulty hearing?: No Does the patient have difficulty seeing, even when wearing glasses/contacts?: No Does the patient have difficulty concentrating, remembering, or making decisions?: Yes Patient able to express need for assistance with ADLs?: Yes Does the patient have difficulty dressing or bathing?: Yes Independently performs ADLs?: No Does the patient have difficulty walking or climbing stairs?: Yes Weakness of Legs: Both Weakness of Arms/Hands: Right  Permission Sought/Granted Permission sought to share information with : Family Supports Permission granted to share information with : Yes, Verbal Permission Granted  Share Information with NAME: [name]     Permission granted to share info w Relationship: Son     Emotional Assessment Appearance:: Appears stated age Attitude/Demeanor/Rapport: Unable to Assess Affect (typically observed): Unable to Assess Orientation: : Oriented to Self Alcohol / Substance Use: Not Applicable Psych Involvement: No (comment)  Admission diagnosis:  Somnolence [R40.0] Weakness of right upper extremity [R29.898] AKI (acute kidney injury) ([place]) [N17.9] Fall in home, initial encounter [W19.XXXA, W23.762] Acute metabolic encephalopathy [G31.51] Sepsis without acute organ dysfunction, due to unspecified organism ([place]) [A41.9]  Patient Active Problem List   Diagnosis Date Noted   SIRS (systemic inflammatory response syndrome) ([place]) 08/10/2022   Lactic acidosis 08/10/2022   Weakness of right upper extremity 08/10/2022   Elevated troponin 08/10/2022    AKI (acute kidney injury) ([place]) 08/10/2022   Transaminitis 08/10/2022   Dehydration 90/30/0923   Acute metabolic encephalopathy 30/04/6225   Delirium 06/06/2022   Rhabdomyolysis 06/04/2022   CAD (coronary artery disease) 06/04/2022   Normocytic anemia 06/04/2022   Urinary tract infection without hematuria    Dyslipidemia 11/25/2021   CHB (complete heart block) ([place]) 11/14/2021   S/P placement of cardiac pacemaker 11/14/2021   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   S/P aortic valve replacement with bioprosthetic valve 11/04/2021   Severe aortic stenosis    Infected abrasion of fifth toe, right, initial encounter 10/31/2019   Pain due to onychomycosis of toenails of both feet 04/25/2019   Porokeratosis 04/25/2019   Gastroesophageal reflux disease 04/22/2018   Moderate calcific aortic stenosis 08/14/2016   PVCs (premature ventricular contractions) 08/14/2016   Hypercholesterolemia 08/14/2016   DJD (degenerative joint disease) 08/14/2016   Essential hypertension 07/16/2016   PCP:  Shelda Pal, DO Pharmacy:   [facility] - [place], [place] [address] Phone: [phone] Fax: [id number] - [place], [place] - [address] [place] [address] [place] [address] [place] [address] Phone: [phone] Fax: [phone]  [facility] [address] [place] [address] Phone: [phone] Fax: [phone]     Social Determinants of Health (SDOH) Interventions    Readmission Risk Interventions     No data to display

## 2022-08-13 NOTE — Care Management Important Message (Signed)
Important Message  Patient Details  Name: Joshua [name] MRN: [id number] Date of Birth: 1936-08-28   Medicare Important Message Given:  Yes     Orbie Pyo 08/13/2022, 3:04 PM

## 2022-08-13 NOTE — NC FL2 (Signed)
[place] LEVEL OF CARE SCREENING TOOL     IDENTIFICATION  Patient Name: Joshua [name] Birthdate: Nov 22, 1935 Sex: male Admission Date (Current Location): 08/09/2022  [facility] and [place] Number:  Herbalist and Address:  [facility] [place], [place] [address] [place], [place] [address]      Provider Number: [id number]  Attending Physician Name and Address:  Darliss Cheney, MD  Relative Name and Phone Number:  Axiel Fjeld, [phone]    Current Level of Care: Hospital Recommended Level of Care: [place] Prior Approval Number:    Date Approved/Denied:   PASRR Number: [id number]  Discharge Plan: SNF    Current Diagnoses: Patient Active Problem List   Diagnosis Date Noted   SIRS (systemic inflammatory response syndrome) ([place]) 08/10/2022   Lactic acidosis 08/10/2022   Weakness of right upper extremity 08/10/2022   Elevated troponin 08/10/2022   AKI (acute kidney injury) ([place]) 08/10/2022   Transaminitis 08/10/2022   Dehydration 69/45/0388   Acute metabolic encephalopathy 82/80/0349   Delirium 06/06/2022   Rhabdomyolysis 06/04/2022   CAD (coronary artery disease) 06/04/2022   Normocytic anemia 06/04/2022   Urinary tract infection without hematuria    Dyslipidemia 11/25/2021   CHB (complete heart block) ([place]) 11/14/2021   S/P placement of cardiac pacemaker 11/14/2021   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   S/P aortic valve replacement with bioprosthetic valve 11/04/2021   Severe aortic stenosis    Infected abrasion of fifth toe, right, initial encounter 10/31/2019   Pain due to onychomycosis of toenails of both feet 04/25/2019   Porokeratosis 04/25/2019   Gastroesophageal reflux disease 04/22/2018   Moderate calcific aortic stenosis 08/14/2016   PVCs (premature ventricular contractions) 08/14/2016   Hypercholesterolemia 08/14/2016   DJD (degenerative joint disease) 08/14/2016   Essential hypertension 07/16/2016     Orientation RESPIRATION BLADDER Height & Weight     Self  Normal Incontinent, External catheter Weight: 155 lb 10.3 oz (70.6 kg) Height:  '5\' 11"'$  (180.3 cm)  BEHAVIORAL SYMPTOMS/MOOD NEUROLOGICAL BOWEL NUTRITION STATUS      Incontinent Diet (See DC summary)  AMBULATORY STATUS COMMUNICATION OF NEEDS Skin   Extensive Assist Verbally Skin abrasions (R arm and ankle abrasions)                       Personal Care Assistance Level of Assistance  Bathing, Feeding, Dressing Bathing Assistance: Limited assistance Feeding assistance: Limited assistance Dressing Assistance: Limited assistance     Functional Limitations Info  Sight, Hearing, Speech Sight Info: Adequate Hearing Info: Adequate Speech Info: Adequate    SPECIAL CARE FACTORS FREQUENCY  PT (By licensed PT), OT (By licensed OT)     PT Frequency: 5x week OT Frequency: 5x week            Contractures Contractures Info: Not present    Additional Factors Info  Code Status, Allergies, Psychotropic Code Status Info: Full Allergies Info: NKA Psychotropic Info: Quetiapine         Current Medications (08/13/2022):  This is the current hospital active medication list Current Facility-Administered Medications  Medication Dose Route Frequency Provider Last Rate Last Admin   0.9 %  sodium chloride infusion   Intravenous Continuous Darliss Cheney, MD 75 mL/hr at 08/13/22 1028 Restarted at 08/13/22 1028   acetaminophen (TYLENOL) tablet 650 mg  650 mg Oral Q6H PRN Howerter, Justin B, DO       Or   acetaminophen (TYLENOL) suppository 650 mg  650  mg Rectal Q6H PRN Howerter, Justin B, DO       enoxaparin (LOVENOX) injection 40 mg  40 mg Subcutaneous Q24H Ventura Sellers, RPH   40 mg at 08/13/22 1022   influenza vaccine adjuvanted (FLUAD) injection 0.5 mL  0.5 mL Intramuscular Tomorrow-1000 Caren Griffins, MD       metoprolol tartrate (LOPRESSOR) tablet 12.5 mg  12.5 mg Oral BID Howerter, Justin B, DO   12.5 mg at  08/13/22 1022   pantoprazole (PROTONIX) EC tablet 40 mg  40 mg Oral Daily Pahwani, Einar Grad, MD   40 mg at 08/13/22 1022   QUEtiapine (SEROQUEL) tablet 25 mg  25 mg Oral Q24H Darliss Cheney, MD   25 mg at 08/12/22 2028     Discharge Medications: Please see discharge summary for a list of discharge medications.  Relevant Imaging Results:  Relevant Lab Results:   Additional Information SS#: [id number]  Coralee Pesa, [place]

## 2022-08-13 NOTE — Progress Notes (Signed)
Mobility Specialist: Progress Note   08/13/22 1647  Mobility  Activity Ambulated with assistance in hallway  Level of Assistance Moderate assist, patient does 50-74%  Assistive Device Front wheel walker  Distance Ambulated (ft) 70 ft  Activity Response Tolerated well  Mobility Referral Yes  $Mobility charge 1 Mobility   Pt received in the chair and agreeable to mobility. Distance limited secondary to general fatigue. Required physical assist to maintain L hand on RW. Pt to bed after session with call bell and phone in reach. Bed alarm is on.   [name] Mobility Specialist Secure Chat Only

## 2022-08-14 LAB — CBC WITH DIFFERENTIAL/PLATELET
Abs Immature Granulocytes: 0.08 10*3/uL — ABNORMAL HIGH (ref 0.00–0.07)
Basophils Absolute: 0 10*3/uL (ref 0.0–0.1)
Basophils Relative: 1 %
Eosinophils Absolute: 0.5 10*3/uL (ref 0.0–0.5)
Eosinophils Relative: 7 %
HCT: 28.3 % — ABNORMAL LOW (ref 39.0–52.0)
Hemoglobin: 10.2 g/dL — ABNORMAL LOW (ref 13.0–17.0)
Immature Granulocytes: 1 %
Lymphocytes Relative: 16 %
Lymphs Abs: 1.2 10*3/uL (ref 0.7–4.0)
MCH: 34.6 pg — ABNORMAL HIGH (ref 26.0–34.0)
MCHC: 36 g/dL (ref 30.0–36.0)
MCV: 95.9 fL (ref 80.0–100.0)
Monocytes Absolute: 0.8 10*3/uL (ref 0.1–1.0)
Monocytes Relative: 11 %
Neutro Abs: 4.8 10*3/uL (ref 1.7–7.7)
Neutrophils Relative %: 64 %
Platelets: 121 10*3/uL — ABNORMAL LOW (ref 150–400)
RBC: 2.95 MIL/uL — ABNORMAL LOW (ref 4.22–5.81)
RDW: 12.3 % (ref 11.5–15.5)
WBC: 7.5 10*3/uL (ref 4.0–10.5)
nRBC: 0 % (ref 0.0–0.2)

## 2022-08-14 LAB — CK: Total CK: 1712 U/L — ABNORMAL HIGH (ref 49–397)

## 2022-08-14 LAB — CULTURE, BLOOD (ROUTINE X 2)
Culture: NO GROWTH
Culture: NO GROWTH
Special Requests: ADEQUATE

## 2022-08-14 LAB — COMPREHENSIVE METABOLIC PANEL
ALT: 56 U/L — ABNORMAL HIGH (ref 0–44)
AST: 118 U/L — ABNORMAL HIGH (ref 15–41)
Albumin: 2.7 g/dL — ABNORMAL LOW (ref 3.5–5.0)
Alkaline Phosphatase: 50 U/L (ref 38–126)
Anion gap: 11 (ref 5–15)
BUN: 15 mg/dL (ref 8–23)
CO2: 24 mmol/L (ref 22–32)
Calcium: 8.5 mg/dL — ABNORMAL LOW (ref 8.9–10.3)
Chloride: 102 mmol/L (ref 98–111)
Creatinine, Ser: 1.06 mg/dL (ref 0.61–1.24)
GFR, Estimated: >60 mL/min (ref >60)
Glucose, Bld: 73 mg/dL (ref 70–99)
Potassium: 3.5 mmol/L (ref 3.5–5.1)
Sodium: 137 mmol/L (ref 135–145)
Total Bilirubin: 2.1 mg/dL — ABNORMAL HIGH (ref 0.3–1.2)
Total Protein: 5.2 g/dL — ABNORMAL LOW (ref 6.5–8.1)

## 2022-08-14 NOTE — Progress Notes (Signed)
Occupational Therapy Treatment Patient Details Name: Joshua [name] MRN: [id number] DOB: April 27, 1936 Today's Date: 08/14/2022   History of present illness Pt is an 86 y/o male who presented after a fall at home (on ground for approx 24 hr) and AMS in setting of acute metabolic encephalopathy and rhabdomyolysis. PMH: CAD, CABG (2023), aortic stenosis, PPM (2023), HTN, HLD   OT comments  Pt initiating eating on arrival, reporting agreeable to attempt to use RUE to eat, so focus session on self feeding with RUE and RUE ROM/strengthening. Pt self feeding for ~10 minutes with RUE then performing again ~5 minutes. Requiring significantly increased time to load utensil due to decreased dexterity. Offering built up grip, but pt reporting not interested. Observed to drop utensil ~20% of the time when OT did not provide A for slight supination. Pt required mod A with support at elbow to bring hand to mouth on all attempts. Pt performing shoulder flexion AAROM x10, elbow flexion x5 against gravity, x7 gravity eliminated, and composite grasp 10x AROM. Pt pleasant and conversational throughout. Continuing to highly recommend AIR to optimize safety and independence in ADL and IADL.    Recommendations for follow up therapy are one component of a multi-disciplinary discharge planning process, led by the attending physician.  Recommendations may be updated based on patient status, additional functional criteria and insurance authorization.    Follow Up Recommendations  Acute inpatient rehab (3hours/day)    Assistance Recommended at Discharge Frequent or constant Supervision/Assistance  Patient can return home with the following  Two people to help with walking and/or transfers;Two people to help with bathing/dressing/bathroom   Equipment Recommendations  None recommended by OT    Recommendations for Other Services Rehab consult    Precautions / Restrictions Precautions Precautions: Fall Required Braces  or Orthoses: Other Brace Other Brace: R resting hand splint Restrictions Weight Bearing Restrictions: No       Mobility Bed Mobility               General bed mobility comments: In chair on arrival and departure    Transfers                   General transfer comment: Focus session on RUE self feeding and strengthening     Balance Overall balance assessment: Needs assistance Sitting-balance support: Feet supported, No upper extremity supported Sitting balance-Leahy Scale: Fair Sitting balance - Comments: Sitting with and without back support                                   ADL either performed or assessed with clinical judgement   ADL Overall ADL's : Needs assistance/impaired Eating/Feeding: Moderate assistance;Sitting Eating/Feeding Details (indicate cue type and reason): Pt requiring mod A for support at elbow as well as elbow flexion to bring fork to mouth. Pt self feeding with RUE ~10 minutes. Grooming: Wash/dry face;Sitting;Set up                                 General ADL Comments: Focus session on self-feeding and RUE strength/ROM.    Extremity/Trunk Assessment Upper Extremity Assessment Upper Extremity Assessment: RUE deficits/detail RUE Deficits / Details: R UE with movement in all joints now though weakness still noted. Removed splint and left off to encourage fucntional movement of UE, propped on pillow for shoulder support   Lower Extremity  Assessment Lower Extremity Assessment: Defer to PT evaluation        Vision   Vision Assessment?: No apparent visual deficits   Perception     Praxis      Cognition Arousal/Alertness: Awake/alert Behavior During Therapy: Flat affect Overall Cognitive Status: Impaired/Different from baseline Area of Impairment: Attention, Following commands, Safety/judgement, Awareness, Problem solving                   Current Attention Level: Sustained   Following  Commands: Follows one step commands consistently, Follows one step commands with increased time Safety/Judgement: Decreased awareness of safety, Decreased awareness of deficits Awareness: Emergent Problem Solving: Slow processing, Requires verbal cues, Decreased initiation, Difficulty sequencing General Comments: oriented, recalling events from this morning and name of mobility specialist. Pt aware of RUE deficits and initially willing to attempt self feeding with RUE, but with decreased speed, eventually becoming less willing to attempt.        Exercises      Shoulder Instructions       General Comments Asking same question twice one time, but overall, orientation and cognition seem better today    Pertinent Vitals/ Pain       Pain Assessment Pain Assessment: No/denies pain  Home Living                                          Prior Functioning/Environment              Frequency  Min 2X/week        Progress Toward Goals  OT Goals(current goals can now be found in the care plan section)  Progress towards OT goals: Progressing toward goals  Acute Rehab OT Goals Patient Stated Goal: Be able to go home eventiually OT Goal Formulation: With patient Time For Goal Achievement: 08/24/22 Potential to Achieve Goals: Good ADL Goals Pt Will Perform Lower Body Bathing: with min assist;sit to/from stand Pt Will Perform Lower Body Dressing: with min assist;sit to/from stand;sitting/lateral leans Pt Will Transfer to Toilet: with mod assist;stand pivot transfer;bedside commode Pt/caregiver will Perform Home Exercise Program: Increased strength;Right Upper extremity;With Supervision;With written HEP provided Additional ADL Goal #1: Pt to tolerate R resting hand splint wear > 4 hours without signs of skin breakdown in order to protect R wrist  Plan Discharge plan needs to be updated    Co-evaluation                 AM-PAC OT "6 Clicks" Daily Activity      Outcome Measure   Help from another person eating meals?: A Little Help from another person taking care of personal grooming?: A Little Help from another person toileting, which includes using toliet, bedpan, or urinal?: A Lot Help from another person bathing (including washing, rinsing, drying)?: A Lot Help from another person to put on and taking off regular upper body clothing?: A Lot Help from another person to put on and taking off regular lower body clothing?: Total 6 Click Score: 13    End of Session    OT Visit Diagnosis: Unsteadiness on feet (R26.81);Other abnormalities of gait and mobility (R26.89);Muscle weakness (generalized) (M62.81);Other symptoms and signs involving cognitive function   Activity Tolerance Patient tolerated treatment well   Patient Left in chair;with call bell/phone within reach;with chair alarm set   Nurse Communication Mobility status  Time: 1636-1700 OT Time Calculation (min): 24 min  Charges: OT General Charges $OT Visit: 1 Visit OT Treatments $Self Care/Home Management : 8-22 mins $Therapeutic Exercise: 8-22 mins  Shanda Howells, OTR/L [facility] Acute Rehabilitation Office: [phone]   Lula Olszewski 08/14/2022, 5:20 PM

## 2022-08-14 NOTE — Progress Notes (Signed)
Mobility Specialist: Progress Note   08/14/22 1320  Mobility  Activity Ambulated with assistance in hallway  Level of Assistance Moderate assist, patient does 50-74%  Assistive Device Front wheel walker  Distance Ambulated (ft) 140 ft  Activity Response Tolerated well  Mobility Referral Yes  $Mobility charge 1 Mobility   Pt received in the bed and agreeable to mobility. Found bowel incontinent and assisted with pericare with help from NT. Pt able to roll x2 in the bed and then hallway ambulation. C/o feeling weak throughout. Pt to the chair after session with call bell and phone in reach. Chair alarm is on.   Joshua [name] Mobility Specialist Secure Chat Only

## 2022-08-14 NOTE — TOC Progression Note (Addendum)
Transition of Care [facility]) - Progression Note    Patient Details  Name: RUFINO STAUP MRN: [id number] Date of Birth: [birth year]  Transition of Care [facility]) CM/SW [place], [place] Phone Number: 08/14/2022, 10:33 AM  Clinical Narrative:    CSW spoke with son, [name] who confirmed they would like to accept the bed offer at [facility]. Authorization started and facility updated. Possible discharge today if authorization is approved. TOC will continue to follow for DC needs.  4:30 Auth is still pending at this time, [facility] can take over the weekend if it is approved. Son unsure if he can transport pt at discharge. TOC will continue to follow for DC needs.  5:00 Authorization approved. Facility requests a DC in the AM as soon as possible. Expected Discharge Plan: Skilled Nursing Facility Barriers to Discharge: Ship broker, Continued Medical Work up, SNF Pending bed offer  Expected Discharge Plan and Services Expected Discharge Plan: [place] Choice: [place] arrangements for the past 2 months: Single Family Home                                       Social Determinants of Health (SDOH) Interventions    Readmission Risk Interventions     No data to display

## 2022-08-15 DIAGNOSIS — I10 Essential (primary) hypertension: Secondary | ICD-10-CM | POA: Diagnosis not present

## 2022-08-15 DIAGNOSIS — G9341 Metabolic encephalopathy: Secondary | ICD-10-CM | POA: Diagnosis not present

## 2022-08-15 DIAGNOSIS — E785 Hyperlipidemia, unspecified: Secondary | ICD-10-CM | POA: Diagnosis not present

## 2022-08-15 DIAGNOSIS — I959 Hypotension, unspecified: Secondary | ICD-10-CM | POA: Diagnosis not present

## 2022-08-15 DIAGNOSIS — I350 Nonrheumatic aortic (valve) stenosis: Secondary | ICD-10-CM | POA: Diagnosis not present

## 2022-08-15 DIAGNOSIS — I455 Other specified heart block: Secondary | ICD-10-CM | POA: Diagnosis not present

## 2022-08-15 DIAGNOSIS — M1990 Unspecified osteoarthritis, unspecified site: Secondary | ICD-10-CM | POA: Diagnosis not present

## 2022-08-15 DIAGNOSIS — Z7401 Bed confinement status: Secondary | ICD-10-CM | POA: Diagnosis not present

## 2022-08-15 DIAGNOSIS — R509 Fever, unspecified: Secondary | ICD-10-CM | POA: Diagnosis not present

## 2022-08-15 DIAGNOSIS — M6281 Muscle weakness (generalized): Secondary | ICD-10-CM | POA: Diagnosis not present

## 2022-08-15 DIAGNOSIS — R41841 Cognitive communication deficit: Secondary | ICD-10-CM | POA: Diagnosis not present

## 2022-08-15 DIAGNOSIS — J9811 Atelectasis: Secondary | ICD-10-CM | POA: Diagnosis not present

## 2022-08-15 DIAGNOSIS — M6282 Rhabdomyolysis: Secondary | ICD-10-CM | POA: Diagnosis not present

## 2022-08-15 DIAGNOSIS — E876 Hypokalemia: Secondary | ICD-10-CM | POA: Diagnosis not present

## 2022-08-15 DIAGNOSIS — R2689 Other abnormalities of gait and mobility: Secondary | ICD-10-CM | POA: Diagnosis not present

## 2022-08-15 DIAGNOSIS — Z951 Presence of aortocoronary bypass graft: Secondary | ICD-10-CM | POA: Diagnosis not present

## 2022-08-15 DIAGNOSIS — D696 Thrombocytopenia, unspecified: Secondary | ICD-10-CM | POA: Diagnosis not present

## 2022-08-15 DIAGNOSIS — Z950 Presence of cardiac pacemaker: Secondary | ICD-10-CM | POA: Diagnosis not present

## 2022-08-15 DIAGNOSIS — A419 Sepsis, unspecified organism: Secondary | ICD-10-CM | POA: Diagnosis not present

## 2022-08-15 LAB — CBC WITH DIFFERENTIAL/PLATELET
Abs Immature Granulocytes: 0.09 10*3/uL — ABNORMAL HIGH (ref 0.00–0.07)
Basophils Absolute: 0 10*3/uL (ref 0.0–0.1)
Basophils Relative: 0 %
Eosinophils Absolute: 0.4 10*3/uL (ref 0.0–0.5)
Eosinophils Relative: 5 %
HCT: 26.9 % — ABNORMAL LOW (ref 39.0–52.0)
Hemoglobin: 9.7 g/dL — ABNORMAL LOW (ref 13.0–17.0)
Immature Granulocytes: 1 %
Lymphocytes Relative: 17 %
Lymphs Abs: 1.4 10*3/uL (ref 0.7–4.0)
MCH: 34.4 pg — ABNORMAL HIGH (ref 26.0–34.0)
MCHC: 36.1 g/dL — ABNORMAL HIGH (ref 30.0–36.0)
MCV: 95.4 fL (ref 80.0–100.0)
Monocytes Absolute: 1.1 10*3/uL — ABNORMAL HIGH (ref 0.1–1.0)
Monocytes Relative: 13 %
Neutro Abs: 5.5 10*3/uL (ref 1.7–7.7)
Neutrophils Relative %: 64 %
Platelets: 135 10*3/uL — ABNORMAL LOW (ref 150–400)
RBC: 2.82 MIL/uL — ABNORMAL LOW (ref 4.22–5.81)
RDW: 12.5 % (ref 11.5–15.5)
WBC: 8.6 10*3/uL (ref 4.0–10.5)
nRBC: 0 % (ref 0.0–0.2)

## 2022-08-15 NOTE — Discharge Summary (Signed)
Physician Discharge Summary  Joshua [name] IOE:[id number] DOB: 1936-05-14 DOA: 08/09/2022  PCP: Shelda Pal, DO  Admit date: 08/09/2022 Discharge date: 08/15/2022 30 Day Unplanned Readmission Risk Score    Flowsheet Row ED to Hosp-Admission (Current) from 08/09/2022 in [facility]  30 Day Unplanned Readmission Risk Score (%) 25.56 Filed at 08/15/2022 0800       This score is the patient's risk of an unplanned readmission within 30 days of being discharged (0 -100%). The score is based on dignosis, age, lab data, medications, orders, and past utilization.   Low:  0-14.9   Medium: 15-21.9   High: 22-29.9   Extreme: 30 and above          Admitted From: Home Disposition: SNF  Recommendations for Outpatient Follow-up:  Follow up with PCP in 1-2 weeks Please obtain BMP/CBC in one week Please follow up with your PCP on the following pending results: Unresulted Labs (From admission, onward)     Start     Ordered   08/10/22 0023  Rapid urine drug screen (hospital performed)  Add-on,   AD        08/10/22 0024              Home Health: None Equipment/Devices: None  Discharge Condition:stable CODE STATUS: Full code Diet recommendation: Cardiac  Subjective: Seen and examined.  He has no complaints.  He is fully alert and oriented.  Brief/Interim Summary: Joshua [name] is a 86 y.o. male with medical history significant for coronary disease status post three-vessel CABG in January 2023, severe aortic stenosis status post aortic valve replacement in January 2023 status post pacemaker placement, hypertension, hyperlipidemia, who is admitted to [facility] on 08/09/2022 with suspected acute metabolic encephalopathy in the setting of rhabdomyolysis after presenting from home to [facility] ED altered mental status.   Acute metabolic encephalopathy: CT head unremarkable.  Ammonia normal.  Likely in the setting of some sort of infection or  rhabdomyolysis.    Delirium: Resolved.  Treated with night due to delirium and delirium precautions.  Completely alert and oriented now.   Rhabdomyolysis: Resolved.   dehydration/lactic acidosis: Lactic acidosis resolved.   Fever with SIRS criteria: 101.3 at Tmax upon admission but has remained afebrile since then and no clear signs of infection with UA and chest x-ray negative as well as blood cultures have been negative and procalcitonin unremarkable as well.  antibiotics were discontinued on 08/11/2022.   Elevated troponin: Jumped from 83 yesterday to 146 and then down to 81 again.  Patient asymptomatic.  Echo shows normal ejection fraction with no wall motion abnormality.  Likely demand ischemia.   Hypomagnesemia: Resolved.   Hypokalemia: Resolved.   Elevated LFTs/shock liver: Wonder if this is likely due to shock, rhabdomyolysis as well.  Left is slightly worse compared to yesterday, ultrasound abdomen negative for any liver disease, shows cholelithiasis.  LFTs now improving.   Hyperlipidemia: On atorvastatin PTA but holding that due to active rhabdomyolysis.  Resuming at the time of discharge.   Essential hypertension: Resume home medications.    Right-sided wrist drop and right upper extremity weakness: Likely due to radial nerve palsy as he was found to have his right arm dropped over the commode.  It is being presumed that he was like that for 24 hours.  He is going to need extended therapy.  PT OT consulted.  They recommended CIR however insurance declined.  SNF approved.  He is being discharged to SNF  today.   Chronic thrombocytopenia: Platelets dropped to as low as 91 on 08/11/2022 but improved to 135 today.  No signs of bleeding.  This is a stable and chronic for him.    Dysphagia: Was found to have some dysphagia while working with PT. evaluated by SLP.  No dysphagia on evaluation.   Right upper extremity edema and tenderness/superficial vein thrombosis right cephalic vein:  Swelling improved.  Patient advised to keep right upper extremity elevated.  Apply cold compresses  Discharge plan was discussed with patient and/or family member and they verbalized understanding and agreed with it.  Discharge Diagnoses:  Principal Problem:   Acute metabolic encephalopathy Active Problems:   Rhabdomyolysis   Essential hypertension   Hypercholesterolemia   SIRS (systemic inflammatory response syndrome) (HCC)   Lactic acidosis   Weakness of right upper extremity   Elevated troponin   AKI (acute kidney injury) ([place])   Transaminitis   Dehydration    Discharge Instructions   Allergies as of 08/15/2022   No Known Allergies      Medication List     TAKE these medications    aspirin EC 81 MG tablet Take 1 tablet (81 mg total) by mouth daily. Swallow whole.   atorvastatin 40 MG tablet Commonly known as: LIPITOR TAKE 1 TABLET EVERY DAY   cyanocobalamin 1000 MCG tablet Commonly known as: VITAMIN B12 Take 1,000 mcg by mouth daily.   GLUCOSAMINE PO Take 3,000 mg by mouth every other day.   lisinopril 20 MG tablet Commonly known as: ZESTRIL TAKE 1 TABLET EVERY DAY   melatonin 5 MG Tabs Take 1 tablet (5 mg total) by mouth at bedtime as needed. What changed: reasons to take this   metoprolol tartrate 25 MG tablet Commonly known as: LOPRESSOR TAKE 1/2 TABLET TWICE DAILY, HOLD FOR HEART RATE LESS THAN 60, SYSTOLIC BLOOD PRESSURE LESS THAN OR EQUAL TO 105 What changed: See the new instructions.   multivitamin capsule Take 1 capsule by mouth daily.   omeprazole 40 MG capsule Commonly known as: PRILOSEC TAKE 1 CAPSULE EVERY DAY        Contact information for follow-up providers     Shelda Pal, DO Follow up in 1 week(s).   Specialty: Family Medicine Contact information: [place] [address] [phone]              Contact information for after-discharge care     Destination      [facility] Preferred SNF .   Service: Skilled Nursing Contact information: [place] [phone]                    No Known Allergies  Consultations: None   Procedures/Studies: VAS [place] UPPER EXTREMITY VENOUS DUPLEX  Result Date: 08/12/2022 UPPER VENOUS STUDY  Patient Name:  Joshua [name]  Date of Exam:   08/12/2022 Medical Rec #: [id number]         Accession #:    [id number] Date of Birth: 04/01/1936        Patient Gender: M Patient Age:   29 years Exam Location:  [facility] Procedure:      VAS [place] UPPER EXTREMITY VENOUS DUPLEX Referring Phys: Bertran Zeimet --------------------------------------------------------------------------------  Indications: Right arm edema, weakness in extremity s/p fall Limitations: Extensive subcutaneous edema, open axilla wound, bandaging. Comparison Study: No prior studies. Performing Technologist: Darlin Coco RDMS, RVT  Examination Guidelines: A complete  evaluation includes B-mode imaging, spectral Doppler, color Doppler, and power Doppler as needed of all accessible portions of each vessel. Bilateral testing is considered an integral part of a complete examination. Limited examinations for reoccurring indications may be performed as noted.  Right Findings: +----------+------------+---------+-----------+-----------------------+-------+ RIGHT     CompressiblePhasicitySpontaneous      Properties       Summary +----------+------------+---------+-----------+-----------------------+-------+ IJV           Full       Yes       Yes                                   +----------+------------+---------+-----------+-----------------------+-------+ Subclavian               Yes       Yes                                   +----------+------------+---------+-----------+-----------------------+-------+ Axillary                 Yes       Yes    Patent by color doppler         +----------+------------+---------+-----------+-----------------------+-------+ Brachial      Full                           High bifurcation            +----------+------------+---------+-----------+-----------------------+-------+ Radial        Full                                                       +----------+------------+---------+-----------+-----------------------+-------+ Ulnar         Full                                                       +----------+------------+---------+-----------+-----------------------+-------+ Cephalic    Partial      Yes       Yes                            Acute  +----------+------------+---------+-----------+-----------------------+-------+ Basilic       Full                                                       +----------+------------+---------+-----------+-----------------------+-------+  Left Findings: +----------+------------+---------+-----------+----------+-------+ LEFT      CompressiblePhasicitySpontaneousPropertiesSummary +----------+------------+---------+-----------+----------+-------+ Subclavian               Yes       Yes                      +----------+------------+---------+-----------+----------+-------+  Summary:  Right: No evidence of deep vein thrombosis in the upper extremity. Findings consistent with acute superficial vein thrombosis involving the right cephalic vein.  Left: No evidence of  thrombosis in the subclavian.  *See table(s) above for measurements and observations.  Diagnosing physician: Harold Barban MD Electronically signed by Harold Barban MD on 08/12/2022 at 10:39:08 PM.    Final    US Abdomen Limited RUQ (LIVER/GB)  Result Date: 08/11/2022 CLINICAL DATA:  Elevated liver function studies. EXAM: ULTRASOUND ABDOMEN LIMITED RIGHT UPPER QUADRANT COMPARISON:  None Available. FINDINGS: Gallbladder: Cholelithiasis with stones measuring up to 1 cm diameter. No wall thickening or pericholecystic  edema. Murphy's sign is negative. Common bile duct: Diameter: 4 mm, normal Liver: No focal lesion identified. Within normal limits in parenchymal echogenicity. Portal vein is patent on color Doppler imaging with normal direction of blood flow towards the liver. Other: None. IMPRESSION: Cholelithiasis without additional evidence of acute cholecystitis. Electronically Signed   By: Lucienne Capers M.D.   On: 08/11/2022 22:41   ECHOCARDIOGRAM COMPLETE  Result Date: 08/10/2022    ECHOCARDIOGRAM REPORT   Patient Name:   Joshua [name] Date of Exam: 08/10/2022 Medical Rec #:  [id number]        Height:       71.0 in Accession #:    [id number]       Weight:       177.2 lb Date of Birth:  [birth year]       BSA:          2.003 m Patient Age:    89 years         BP:           107/65 mmHg Patient Gender: M                HR:           72 bpm. Exam Location:  Inpatient Procedure: 2D Echo, Color Doppler, Cardiac Doppler and Intracardiac            Opacification Agent Indications:    Elevated troponin  History:        Patient has prior history of Echocardiogram examinations, most                 recent 12/22/2021. Pacemaker; Risk Factors:Hypertension and                 Dyslipidemia.  Sonographer:    Memory Argue Referring Phys: [id number] [place]  1. Left ventricular ejection fraction, by estimation, is 60 to 65%. The left ventricle has normal function. The left ventricle has no regional wall motion abnormalities. There is mild concentric left ventricular hypertrophy. Left ventricular diastolic parameters are consistent with Grade I diastolic dysfunction (impaired relaxation).  2. Right ventricular systolic function was not well visualized. The right ventricular size is normal. Tricuspid regurgitation signal is inadequate for assessing PA pressure.  3. Left atrial size was mild to moderately dilated.  4. The mitral valve is degenerative. No evidence of mitral valve regurgitation. No evidence of mitral  stenosis.  5. The aortic valve was not well visualized. There is moderate calcification of the aortic valve. Aortic valve regurgitation is not visualized. Moderate aortic valve stenosis. Aortic valve area, by VTI measures 1.38 cm. Aortic valve mean gradient measures 23.0 mmHg.  6. Technically difficult study with poor acoustic windows. FINDINGS  Left Ventricle: Left ventricular ejection fraction, by estimation, is 60 to 65%. The left ventricle has normal function. The left ventricle has no regional wall motion abnormalities. The left ventricular internal cavity size was normal in size. There is  mild concentric left ventricular hypertrophy. Left ventricular diastolic parameters are  consistent with Grade I diastolic dysfunction (impaired relaxation). Right Ventricle: The right ventricular size is normal. Right vetricular wall thickness was not well visualized. Right ventricular systolic function was not well visualized. Tricuspid regurgitation signal is inadequate for assessing PA pressure. Left Atrium: Left atrial size was mild to moderately dilated. Right Atrium: Right atrial size was normal in size. Pericardium: There is no evidence of pericardial effusion. Mitral Valve: The mitral valve is degenerative in appearance. There is mild calcification of the mitral valve leaflet(s). Mild to moderate mitral annular calcification. No evidence of mitral valve regurgitation. No evidence of mitral valve stenosis. Tricuspid Valve: The tricuspid valve is normal in structure. Tricuspid valve regurgitation is not demonstrated. Aortic Valve: The aortic valve was not well visualized. There is moderate calcification of the aortic valve. Aortic valve regurgitation is not visualized. Moderate aortic stenosis is present. Aortic valve mean gradient measures 23.0 mmHg. Aortic valve peak gradient measures 42.5 mmHg. Aortic valve area, by VTI measures 1.38 cm. Pulmonic Valve: The pulmonic valve was normal in structure. Pulmonic valve  regurgitation is not visualized. Aorta: The aortic root is normal in size and structure. Venous: The inferior vena cava was not well visualized. IAS/Shunts: No atrial level shunt detected by color flow Doppler.  LEFT VENTRICLE PLAX 2D LVIDd:         4.10 cm   Diastology LVIDs:         2.60 cm   LV e' medial:    5.87 cm/s LV PW:         1.30 cm   LV E/e' medial:  12.3 LV IVS:        1.30 cm   LV e' lateral:   7.83 cm/s LVOT diam:     2.20 cm   LV E/e' lateral: 9.2 LV SV:         81 LV SV Index:   40 LVOT Area:     3.80 cm  LEFT ATRIUM             Index        RIGHT ATRIUM           Index LA diam:        3.40 cm 1.70 cm/m   RA Area:     15.10 cm LA Vol (A2C):   80.2 ml 40.04 ml/m  RA Volume:   34.80 ml  17.37 ml/m LA Vol (A4C):   72.5 ml 36.19 ml/m LA Biplane Vol: 76.6 ml 38.24 ml/m  AORTIC VALVE AV Area (Vmax):    1.27 cm AV Area (Vmean):   1.27 cm AV Area (VTI):     1.38 cm AV Vmax:           326.00 cm/s AV Vmean:          213.250 cm/s AV VTI:            0.583 m AV Peak Grad:      42.5 mmHg AV Mean Grad:      23.0 mmHg LVOT Vmax:         109.00 cm/s LVOT Vmean:        71.200 cm/s LVOT VTI:          0.212 m LVOT/AV VTI ratio: 0.36  AORTA Ao Root diam: 2.30 cm Ao Asc diam:  3.00 cm MITRAL VALVE MV Area (PHT): 2.91 cm     SHUNTS MV Decel Time: 261 msec     Systemic VTI:  0.21 m MV E velocity: 72.40 cm/s  Systemic Diam: 2.20 cm MV A velocity: 123.00 cm/s MV E/A ratio:  0.59 Dalton McleanMD Electronically signed by Franki Monte Signature Date/Time: 08/10/2022/2:02:57 PM    Final    DG Shoulder Right Portable  Result Date: 08/09/2022 CLINICAL DATA:  Fall.  Altered mental status. EXAM: RIGHT SHOULDER - 1 VIEW COMPARISON:  None Available. FINDINGS: Degenerative changes in the acromioclavicular and glenohumeral joints. Calcification in the subacromial space consistent with calcific tendinosis. No evidence of acute fracture or dislocation. No focal bone lesion or bone destruction. Vascular calcifications  in the soft tissues. IMPRESSION: Degenerative changes in the right shoulder. Soft tissue calcifications suggesting calcific tendinosis. No acute bony abnormalities. Electronically Signed   By: Lucienne Capers M.D.   On: 08/09/2022 22:12   DG Pelvis Portable  Result Date: 08/09/2022 CLINICAL DATA:  Fall, altered mental status EXAM: PORTABLE PELVIS 1-2 VIEWS COMPARISON:  None Available. FINDINGS: Normal alignment. No acute fracture or dislocation. Sacroiliac and hip joint spaces are preserved. Advanced vascular calcifications are noted within the pelvis and medial thighs. IMPRESSION: No acute fracture or dislocation. Electronically Signed   By: Fidela Salisbury M.D.   On: 08/09/2022 21:06   DG Chest Port 1 View  Result Date: 08/09/2022 CLINICAL DATA:  Altered mental status. EXAM: PORTABLE CHEST 1 VIEW COMPARISON:  06/03/2022 FINDINGS: Cardiac pacemaker. Postoperative changes in the mediastinum. Shallow inspiration. Heart size and pulmonary vascularity are normal. Lungs are clear. No pleural effusions. No pneumothorax. Mediastinal contours appear intact. IMPRESSION: No active disease. Electronically Signed   By: Lucienne Capers M.D.   On: 08/09/2022 21:05   CT ANGIO HEAD NECK W WO CM  Result Date: 08/09/2022 CLINICAL DATA:  Follow-up examination for stroke. EXAM: CT ANGIOGRAPHY HEAD AND NECK TECHNIQUE: Multidetector CT imaging of the head and neck was performed using the standard protocol during bolus administration of intravenous contrast. Multiplanar CT image reconstructions and MIPs were obtained to evaluate the vascular anatomy. Carotid stenosis measurements (when applicable) are obtained utilizing NASCET criteria, using the distal internal carotid diameter as the denominator. RADIATION DOSE REDUCTION: This exam was performed according to the departmental dose-optimization program which includes automated exposure control, adjustment of the mA and/or kV according to patient size and/or use of  iterative reconstruction technique. CONTRAST:  18m OMNIPAQUE IOHEXOL 350 MG/ML SOLN COMPARISON:  Prior study from 06/20/2022 FINDINGS: CT HEAD FINDINGS Brain: Age-related cerebral atrophy with mild chronic small vessel ischemic disease. No acute intracranial hemorrhage. No acute large vessel territory infarct. No mass lesion or midline shift. Ventricular prominence related global parenchymal atrophy without hydrocephalus. No extra-axial fluid collection. Vascular: No hyperdense vessel. Calcified atherosclerosis present at skull base. Skull: Scalp soft tissues and calvarium demonstrate no acute finding. Sinuses/Orbits: Globes orbital soft tissues within normal limits. Paranasal sinuses are clear. Other: No mastoid effusion. Review of the MIP images confirms the above findings CTA NECK FINDINGS Aortic arch: Partially visualized aortic arch within normal limits for caliber. Origin of the great vessels incompletely visualized on this exam. No visible stenosis. Right carotid system: Right common and internal carotid arteries patent without dissection or occlusion. Bulky calcified plaque about the right carotid bulb/proximal right ICA with associated stenosis of up to 65% by NASCET criteria. Left carotid system: Left common and internal carotid arteries patent without dissection or occlusion. Bulky calcified plaque about the left carotid bulb with associated stenosis of up to 50% by NASCET criteria. Vertebral arteries: Both vertebral arteries arise from the subclavian arteries. No significant proximal subclavian artery stenosis. Left vertebral artery slightly  dominant. Vertebral arteries patent without stenosis, dissection or occlusion. Skeleton: No visible worrisome osseous lesions. Moderate to advanced multilevel cervical spondylosis, most pronounced at C6-7. Other neck: No other acute soft tissue abnormality within the neck. Upper chest: Visualized upper chest demonstrates no acute finding. Left-sided pacemaker/AICD  noted. Review of the MIP images confirms the above findings CTA HEAD FINDINGS Anterior circulation: Moderate atheromatous plaque within the carotid siphons with no more than mild diffuse stenosis. A1 segments patent bilaterally. Normal anterior communicating complex. Both anterior cerebral arteries widely patent. Normal in stenosis or occlusion. No proximal MCA branch occlusion or stenosis. Distal MCA branches perfused and symmetric. Posterior circulation: Both V4 segments patent without significant stenosis. Left PICA patent. Right PICA not seen. Basilar patent to its distal aspect without stenosis. Superior cerebellar arteries patent bilaterally. Both PCAs primarily supplied via the basilar well perfused or distal aspects. Small left posterior communicating artery noted. Venous sinuses: Patent allowing for timing the contrast bolus. Anatomic variants: None significant. Review of the MIP images confirms the above findings IMPRESSION: 1. Negative CTA for large vessel occlusion or other emergent finding. 2. Bulky calcified plaque about the carotid bifurcations bilaterally with associated stenoses of up to 65% on the right and 50% on the left. 3. No other hemodynamically significant or correctable stenosis about the major arterial vasculature of the head and neck. 4. No other acute intracranial abnormality. 5. Underlying age-related cerebral atrophy with chronic microvascular ischemic disease. Electronically Signed   By: Jeannine Boga M.D.   On: 08/09/2022 20:53   CT Cervical Spine Wo Contrast  Result Date: 08/09/2022 CLINICAL DATA:  Neck trauma, intoxicated or obtunded (Age >= 16y) EXAM: CT CERVICAL SPINE WITHOUT CONTRAST TECHNIQUE: Multidetector CT imaging of the cervical spine was performed without intravenous contrast. Multiplanar CT image reconstructions were also generated. RADIATION DOSE REDUCTION: This exam was performed according to the departmental dose-optimization program which includes  automated exposure control, adjustment of the mA and/or kV according to patient size and/or use of iterative reconstruction technique. COMPARISON:  06/20/2022 FINDINGS: Alignment: Normal. Skull base and vertebrae: Craniocervical alignment is normal. The atlantodental interval is not widened. No acute fracture of the cervical spine. Vertebral body height is preserved. Soft tissues and spinal canal: No prevertebral fluid or swelling. No visible canal hematoma. Advanced atherosclerotic calcification within the carotid bifurcations Disc levels: There is intervertebral disc space narrowing and endplate remodeling throughout the cervical spine, most severe at C5-T2 in keeping with changes of advanced degenerative disc disease. Prevertebral soft tissues are not thickened. Spinal canal is widely patent. Multilevel uncovertebral and facet arthrosis results in multilevel mild-to-moderate neuroforaminal narrowing, most severe on the right at C3-4 and C4-5 and on the left at C6-7. Upper chest: Negative. Other: None IMPRESSION: 1. No acute fracture or listhesis of the cervical spine. 2. Multilevel degenerative disc disease and facet arthrosis resulting in multilevel neuroforaminal narrowing as described above. 3. Advanced atherosclerotic calcification within the carotid bifurcations bilaterally. This is better assessed on accompanying CT arteriography Electronically Signed   By: Fidela Salisbury M.D.   On: 08/09/2022 20:44     Discharge Exam: Vitals:   08/15/22 0359 08/15/22 0743  BP: 129/67 (!) 149/68  Pulse: 75 90  Resp: 20 16  Temp: 98.2 F (36.8 C) 98 F (36.7 C)  SpO2: 100% 100%   Vitals:   08/14/22 2002 08/14/22 2352 08/15/22 0359 08/15/22 0743  BP: (!) 107/54 111/63 129/67 (!) 149/68  Pulse: 81 70 75 90  Resp: '18 20 20 '$ 16  Temp: 98.4 F (36.9 C) 98 F (36.7 C) 98.2 F (36.8 C) 98 F (36.7 C)  TempSrc: Oral Oral Oral Oral  SpO2: 97% 96% 100% 100%  Weight:      Height:        General: Pt is  alert, awake, not in acute distress Cardiovascular: RRR, S1/S2 +, no rubs, no gallops Respiratory: CTA bilaterally, no wheezing, no rhonchi Abdominal: Soft, NT, ND, bowel sounds + Extremities: Edema right upper extremity.  4/5 power in right upper extremity.    The results of significant diagnostics from this hospitalization (including imaging, microbiology, ancillary and laboratory) are listed below for reference.     Microbiology: Recent Results (from the past 240 hour(s))  Urine Culture     Status: None   Collection Time: 08/09/22  8:59 PM   Specimen: In/Out Cath Urine  Result Value Ref Range Status   Specimen Description IN/OUT CATH URINE  Final   Special Requests NONE  Final   Culture   Final    NO GROWTH Performed at [facility], [address], [place], [place] [address]    Report Status 08/10/2022 FINAL  Final  Blood Culture (routine x 2)     Status: None   Collection Time: 08/09/22  9:00 PM   Specimen: BLOOD LEFT ARM  Result Value Ref Range Status   Specimen Description BLOOD LEFT ARM  Final   Special Requests   Final    BOTTLES DRAWN AEROBIC AND ANAEROBIC Blood Culture results may not be optimal due to an excessive volume of blood received in culture bottles   Culture   Final    NO GROWTH 5 DAYS Performed at [facility], [place] [address], [place], [place] [address]    Report Status 08/14/2022 FINAL  Final  Blood Culture (routine x 2)     Status: None   Collection Time: 08/09/22  9:10 PM   Specimen: BLOOD RIGHT ARM  Result Value Ref Range Status   Specimen Description BLOOD RIGHT ARM  Final   Special Requests   Final    BOTTLES DRAWN AEROBIC AND ANAEROBIC Blood Culture adequate volume   Culture   Final    NO GROWTH 5 DAYS Performed at [facility], [place] [address], [place], [place] [address]    Report Status 08/14/2022 FINAL  Final  SARS Coronavirus 2 by RT PCR (hospital order, performed in [facility] hospital lab) *cepheid single result  test* Anterior Nasal Swab     Status: None   Collection Time: 08/09/22 10:30 PM   Specimen: Anterior Nasal Swab  Result Value Ref Range Status   SARS Coronavirus 2 by RT PCR NEGATIVE NEGATIVE Final    Comment: (NOTE) SARS-CoV-2 target nucleic acids are NOT DETECTED.  The SARS-CoV-2 RNA is generally detectable in upper and lower respiratory specimens during the acute phase of infection. The lowest concentration of SARS-CoV-2 viral copies this assay can detect is 250 copies / mL. A negative result does not preclude SARS-CoV-2 infection and should not be used as the sole basis for treatment or other patient management decisions.  A negative result may occur with improper specimen collection / handling, submission of specimen other than nasopharyngeal swab, presence of viral mutation(s) within the areas targeted by this assay, and inadequate number of viral copies (<250 copies / mL). A negative result must be combined with clinical observations, patient history, and epidemiological information.  Fact Sheet for Patients:   https://www.patel.info/  Fact Sheet for  Healthcare Providers: https://hall.com/  This test is not yet approved or  cleared by the [place] and has been authorized for detection and/or diagnosis of SARS-CoV-2 by FDA under an Emergency Use Authorization (EUA).  This EUA will remain in effect (meaning this test can be used) for the duration of the COVID-19 declaration under Section 564(b)(1) of the Act, 21 U.S.C. section 360bbb-3(b)(1), unless the authorization is terminated or revoked sooner.  Performed at [facility], [place] [address] [place], [place] [address]      Labs: BNP (last 3 results) Recent Labs    09/25/21 1133 08/10/22 0400  BNP 33.0 322.0*   Basic Metabolic Panel: Recent Labs  Lab 08/09/22 2230 08/10/22 0400 08/11/22 0600 08/12/22 0558 08/13/22 0510 08/14/22 0549  NA  --  136 136  136 134* 137  K  --  4.4 3.8 3.2* 4.1 3.5  CL  --  103 103 101 101 102  CO2  --  '23 23 26 '$ 21* 24  GLUCOSE  --  148* 108* 91 74 73  BUN  --  18 26* '18 15 15  '$ CREATININE  --  1.04 0.99 0.88 0.96 1.06  CALCIUM  --  8.3* 8.3* 8.5* 8.6* 8.5*  MG 1.7 1.6*  --   --  1.8  --   PHOS  --  3.8  --   --   --   --    Liver Function Tests: Recent Labs  Lab 08/09/22 2004 08/10/22 0400 08/11/22 0600 08/12/22 0558 08/14/22 0549  AST 132* 182* 200* 227* 118*  ALT 37 47* 57* 73* 56*  ALKPHOS 80 51 48 50 50  BILITOT 2.4* 2.5* 1.4* 1.7* 2.1*  PROT 8.0 5.7* 5.4* 5.7* 5.2*  ALBUMIN 4.3 3.1* 2.8* 3.0* 2.7*   No results for input(s): "LIPASE", "AMYLASE" in the last 168 hours. Recent Labs  Lab 08/10/22 0400  AMMONIA 13   CBC: Recent Labs  Lab 08/09/22 2004 08/09/22 2009 08/10/22 0400 08/11/22 0600 08/14/22 0549 08/15/22 0238  WBC 14.0*  --  11.1* 9.8 7.5 8.6  NEUTROABS 12.5*  --  9.3* 7.6 4.8 5.5  HGB 15.5 15.6  15.3 12.0* 10.0* 10.2* 9.7*  HCT 44.1 46.0  45.0 35.7* 29.4* 28.3* 26.9*  MCV 98.4  --  100.8* 99.7 95.9 95.4  PLT 138*  --  114* 91* 121* 135*   Cardiac Enzymes: Recent Labs  Lab 08/10/22 1706 08/11/22 0600 08/12/22 0558 08/13/22 0510 08/14/22 0549  CKTOTAL 8,205* 6,408* 5,870* 4,093* 1,712*   BNP: Invalid input(s): "POCBNP" CBG: No results for input(s): "GLUCAP" in the last 168 hours. D-Dimer No results for input(s): "DDIMER" in the last 72 hours. Hgb A1c No results for input(s): "HGBA1C" in the last 72 hours. Lipid Profile No results for input(s): "CHOL", "HDL", "LDLCALC", "TRIG", "CHOLHDL", "LDLDIRECT" in the last 72 hours. Thyroid function studies No results for input(s): "TSH", "T4TOTAL", "T3FREE", "THYROIDAB" in the last 72 hours.  Invalid input(s): "FREET3" Anemia work up No results for input(s): "VITAMINB12", "FOLATE", "FERRITIN", "TIBC", "IRON", "RETICCTPCT" in the last 72 hours. Urinalysis    Component Value Date/Time   COLORURINE YELLOW  08/09/2022 2059   APPEARANCEUR CLEAR 08/09/2022 2059   LABSPEC >1.030 (H) 08/09/2022 2059   PHURINE 5.5 08/09/2022 2059   GLUCOSEU 100 (A) 08/09/2022 2059   HGBUR LARGE (A) 08/09/2022 2059   BILIRUBINUR SMALL (A) 08/09/2022 2059   KETONESUR 15 (A) 08/09/2022 2059   PROTEINUR 100 (A) 08/09/2022 2059   NITRITE NEGATIVE 08/09/2022  2059   LEUKOCYTESUR NEGATIVE 08/09/2022 2059   Sepsis Labs Recent Labs  Lab 08/10/22 0400 08/11/22 0600 08/14/22 0549 08/15/22 0238  WBC 11.1* 9.8 7.5 8.6   Microbiology Recent Results (from the past 240 hour(s))  Urine Culture     Status: None   Collection Time: 08/09/22  8:59 PM   Specimen: In/Out Cath Urine  Result Value Ref Range Status   Specimen Description IN/OUT CATH URINE  Final   Special Requests NONE  Final   Culture   Final    NO GROWTH Performed at [facility], [place] [address] [place], [place] [address]    Report Status 08/10/2022 FINAL  Final  Blood Culture (routine x 2)     Status: None   Collection Time: 08/09/22  9:00 PM   Specimen: BLOOD LEFT ARM  Result Value Ref Range Status   Specimen Description BLOOD LEFT ARM  Final   Special Requests   Final    BOTTLES DRAWN AEROBIC AND ANAEROBIC Blood Culture results may not be optimal due to an excessive volume of blood received in culture bottles   Culture   Final    NO GROWTH 5 DAYS Performed at [facility], [place] [address], [place], [place] [address]    Report Status 08/14/2022 FINAL  Final  Blood Culture (routine x 2)     Status: None   Collection Time: 08/09/22  9:10 PM   Specimen: BLOOD RIGHT ARM  Result Value Ref Range Status   Specimen Description BLOOD RIGHT ARM  Final   Special Requests   Final    BOTTLES DRAWN AEROBIC AND ANAEROBIC Blood Culture adequate volume   Culture   Final    NO GROWTH 5 DAYS Performed at [facility], [place] [address], [place], [place] [address]    Report Status 08/14/2022 FINAL  Final  SARS Coronavirus 2 by RT PCR (hospital  order, performed in [facility] lab) *cepheid single result test* Anterior Nasal Swab     Status: None   Collection Time: 08/09/22 10:30 PM   Specimen: Anterior Nasal Swab  Result Value Ref Range Status   SARS Coronavirus 2 by RT PCR NEGATIVE NEGATIVE Final    Comment: (NOTE) SARS-CoV-2 target nucleic acids are NOT DETECTED.  The SARS-CoV-2 RNA is generally detectable in upper and lower respiratory specimens during the acute phase of infection. The lowest concentration of SARS-CoV-2 viral copies this assay can detect is 250 copies / mL. A negative result does not preclude SARS-CoV-2 infection and should not be used as the sole basis for treatment or other patient management decisions.  A negative result may occur with improper specimen collection / handling, submission of specimen other than nasopharyngeal swab, presence of viral mutation(s) within the areas targeted by this assay, and inadequate number of viral copies (<250 copies / mL). A negative result must be combined with clinical observations, patient history, and epidemiological information.  Fact Sheet for Patients:   https://www.patel.info/  Fact Sheet for Healthcare Providers: https://hall.com/  This test is not yet approved or  cleared by the [place] FDA and has been authorized for detection and/or diagnosis of SARS-CoV-2 by FDA under an Emergency Use Authorization (EUA).  This EUA will remain in effect (meaning this test can be used) for the duration of the COVID-19 declaration under Section 564(b)(1) of the Act, 21 U.S.C. section 360bbb-3(b)(1), unless the authorization is terminated or revoked sooner.  Performed at [facility], [place]  [address], [place], [place] [address]      Time coordinating discharge: Over 30 minutes  SIGNED:   Darliss Cheney, MD  [facility] 08/15/2022, 9:17 AM *Please note that this is a verbal dictation therefore  any spelling or grammatical errors are due to the "[place] [facility]" system interpretation. If 7PM-7AM, please contact night-coverage [email or web address]

## 2022-08-15 NOTE — TOC Transition Note (Signed)
Transition of Care [facility]) - CM/SW Discharge Note   Patient Details  Name: Joshua [name] MRN: [id number] Date of Birth: 10/12/1936  Transition of Care [facility]) CM/SW Contact:  Amador Cunas, [place] Phone Number: 08/15/2022, 10:15 AM   Clinical Narrative:  Pt for dc to [facility] today. Spoke to [place] at [facility] who confirmed they are prepared to admit pt to room 112. Unable to reach pt's son [name], spoke to son [name] who reports agreeable to dc. Pt's son requesting medical update, notified MD. RN provided with number for report and PTAR arranged for transport. SW signing off at dc.   Wandra Feinstein, MSW, LCSW [phone] (coverage)       Final next level of care: Skilled Nursing Facility Barriers to Discharge: No Barriers Identified   Patient Goals and CMS Choice Patient states their goals for this hospitalization and ongoing recovery are:: Pt disoriented and unable to participate in goal setting. CMS Medicare.gov Compare Post Acute Care list provided to:: Patient Represenative (must comment) (Son, [name]) Choice offered to / list presented to : Adult Children  Discharge Placement              Patient [facility] bed at: [facility] Patient to be transferred to facility by: [place] Name of family member notified: [name]/son Patient and family notified of of transfer: 08/15/22  Discharge Plan and Services     Post Acute Care Choice: [place]                               Social Determinants of Health (SDOH) Interventions     Readmission Risk Interventions     No data to display

## 2022-08-17 ENCOUNTER — Telehealth: Payer: Self-pay

## 2022-08-17 DIAGNOSIS — E785 Hyperlipidemia, unspecified: Secondary | ICD-10-CM | POA: Diagnosis not present

## 2022-08-17 DIAGNOSIS — M6282 Rhabdomyolysis: Secondary | ICD-10-CM | POA: Diagnosis not present

## 2022-08-17 DIAGNOSIS — G9341 Metabolic encephalopathy: Secondary | ICD-10-CM | POA: Diagnosis not present

## 2022-08-17 DIAGNOSIS — Z950 Presence of cardiac pacemaker: Secondary | ICD-10-CM | POA: Diagnosis not present

## 2022-08-17 DIAGNOSIS — I10 Essential (primary) hypertension: Secondary | ICD-10-CM | POA: Diagnosis not present

## 2022-08-17 DIAGNOSIS — M1990 Unspecified osteoarthritis, unspecified site: Secondary | ICD-10-CM | POA: Diagnosis not present

## 2022-08-17 DIAGNOSIS — M6281 Muscle weakness (generalized): Secondary | ICD-10-CM | POA: Diagnosis not present

## 2022-08-17 DIAGNOSIS — I350 Nonrheumatic aortic (valve) stenosis: Secondary | ICD-10-CM | POA: Diagnosis not present

## 2022-08-17 DIAGNOSIS — Z951 Presence of aortocoronary bypass graft: Secondary | ICD-10-CM | POA: Diagnosis not present

## 2022-08-17 DIAGNOSIS — A419 Sepsis, unspecified organism: Secondary | ICD-10-CM | POA: Diagnosis not present

## 2022-08-17 DIAGNOSIS — R509 Fever, unspecified: Secondary | ICD-10-CM | POA: Diagnosis not present

## 2022-08-17 NOTE — Telephone Encounter (Signed)
Patient's son Davante Gerke will like a phone call at [phone] to give provider some information about his father. Patient currently at rehab after a fall.

## 2022-08-17 NOTE — Telephone Encounter (Signed)
Noted, we were aware of his recent hospitalization.

## 2022-08-18 DIAGNOSIS — E785 Hyperlipidemia, unspecified: Secondary | ICD-10-CM | POA: Diagnosis not present

## 2022-08-18 DIAGNOSIS — M6281 Muscle weakness (generalized): Secondary | ICD-10-CM | POA: Diagnosis not present

## 2022-08-18 DIAGNOSIS — D696 Thrombocytopenia, unspecified: Secondary | ICD-10-CM | POA: Diagnosis not present

## 2022-08-18 DIAGNOSIS — I10 Essential (primary) hypertension: Secondary | ICD-10-CM | POA: Diagnosis not present

## 2022-08-20 ENCOUNTER — Encounter: Payer: Self-pay | Admitting: Family Medicine

## 2022-08-20 DIAGNOSIS — E785 Hyperlipidemia, unspecified: Secondary | ICD-10-CM | POA: Diagnosis not present

## 2022-08-20 DIAGNOSIS — I10 Essential (primary) hypertension: Secondary | ICD-10-CM | POA: Diagnosis not present

## 2022-08-20 DIAGNOSIS — A419 Sepsis, unspecified organism: Secondary | ICD-10-CM | POA: Diagnosis not present

## 2022-08-20 DIAGNOSIS — M1990 Unspecified osteoarthritis, unspecified site: Secondary | ICD-10-CM | POA: Diagnosis not present

## 2022-08-20 DIAGNOSIS — M6281 Muscle weakness (generalized): Secondary | ICD-10-CM | POA: Diagnosis not present

## 2022-08-20 DIAGNOSIS — I350 Nonrheumatic aortic (valve) stenosis: Secondary | ICD-10-CM | POA: Diagnosis not present

## 2022-08-20 DIAGNOSIS — Z950 Presence of cardiac pacemaker: Secondary | ICD-10-CM | POA: Diagnosis not present

## 2022-08-20 DIAGNOSIS — Z951 Presence of aortocoronary bypass graft: Secondary | ICD-10-CM | POA: Diagnosis not present

## 2022-08-21 DIAGNOSIS — I10 Essential (primary) hypertension: Secondary | ICD-10-CM | POA: Diagnosis not present

## 2022-08-24 DIAGNOSIS — E785 Hyperlipidemia, unspecified: Secondary | ICD-10-CM | POA: Diagnosis not present

## 2022-08-24 DIAGNOSIS — A419 Sepsis, unspecified organism: Secondary | ICD-10-CM | POA: Diagnosis not present

## 2022-08-24 DIAGNOSIS — E876 Hypokalemia: Secondary | ICD-10-CM | POA: Diagnosis not present

## 2022-08-24 DIAGNOSIS — M1990 Unspecified osteoarthritis, unspecified site: Secondary | ICD-10-CM | POA: Diagnosis not present

## 2022-08-24 DIAGNOSIS — D696 Thrombocytopenia, unspecified: Secondary | ICD-10-CM | POA: Diagnosis not present

## 2022-08-24 DIAGNOSIS — I10 Essential (primary) hypertension: Secondary | ICD-10-CM | POA: Diagnosis not present

## 2022-08-24 DIAGNOSIS — I350 Nonrheumatic aortic (valve) stenosis: Secondary | ICD-10-CM | POA: Diagnosis not present

## 2022-08-24 DIAGNOSIS — Z950 Presence of cardiac pacemaker: Secondary | ICD-10-CM | POA: Diagnosis not present

## 2022-08-24 DIAGNOSIS — G9341 Metabolic encephalopathy: Secondary | ICD-10-CM | POA: Diagnosis not present

## 2022-08-24 DIAGNOSIS — Z951 Presence of aortocoronary bypass graft: Secondary | ICD-10-CM | POA: Diagnosis not present

## 2022-08-24 DIAGNOSIS — M6281 Muscle weakness (generalized): Secondary | ICD-10-CM | POA: Diagnosis not present

## 2022-08-25 DIAGNOSIS — E785 Hyperlipidemia, unspecified: Secondary | ICD-10-CM | POA: Diagnosis not present

## 2022-08-25 DIAGNOSIS — I10 Essential (primary) hypertension: Secondary | ICD-10-CM | POA: Diagnosis not present

## 2022-08-25 DIAGNOSIS — M6281 Muscle weakness (generalized): Secondary | ICD-10-CM | POA: Diagnosis not present

## 2022-08-25 DIAGNOSIS — D696 Thrombocytopenia, unspecified: Secondary | ICD-10-CM | POA: Diagnosis not present

## 2022-08-26 ENCOUNTER — Other Ambulatory Visit: Payer: Self-pay | Admitting: Family Medicine

## 2022-08-26 DIAGNOSIS — I10 Essential (primary) hypertension: Secondary | ICD-10-CM

## 2022-08-27 DIAGNOSIS — I350 Nonrheumatic aortic (valve) stenosis: Secondary | ICD-10-CM | POA: Diagnosis not present

## 2022-08-27 DIAGNOSIS — A419 Sepsis, unspecified organism: Secondary | ICD-10-CM | POA: Diagnosis not present

## 2022-08-27 DIAGNOSIS — I10 Essential (primary) hypertension: Secondary | ICD-10-CM | POA: Diagnosis not present

## 2022-08-27 DIAGNOSIS — M1990 Unspecified osteoarthritis, unspecified site: Secondary | ICD-10-CM | POA: Diagnosis not present

## 2022-08-27 DIAGNOSIS — Z951 Presence of aortocoronary bypass graft: Secondary | ICD-10-CM | POA: Diagnosis not present

## 2022-08-27 DIAGNOSIS — M6281 Muscle weakness (generalized): Secondary | ICD-10-CM | POA: Diagnosis not present

## 2022-08-27 DIAGNOSIS — E785 Hyperlipidemia, unspecified: Secondary | ICD-10-CM | POA: Diagnosis not present

## 2022-08-27 DIAGNOSIS — Z950 Presence of cardiac pacemaker: Secondary | ICD-10-CM | POA: Diagnosis not present

## 2022-08-28 DIAGNOSIS — M6281 Muscle weakness (generalized): Secondary | ICD-10-CM | POA: Diagnosis not present

## 2022-08-28 DIAGNOSIS — E785 Hyperlipidemia, unspecified: Secondary | ICD-10-CM | POA: Diagnosis not present

## 2022-08-28 DIAGNOSIS — I10 Essential (primary) hypertension: Secondary | ICD-10-CM | POA: Diagnosis not present

## 2022-08-31 ENCOUNTER — Ambulatory Visit (INDEPENDENT_AMBULATORY_CARE_PROVIDER_SITE_OTHER): Payer: Medicare HMO

## 2022-08-31 DIAGNOSIS — I442 Atrioventricular block, complete: Secondary | ICD-10-CM

## 2022-09-01 DIAGNOSIS — M6281 Muscle weakness (generalized): Secondary | ICD-10-CM | POA: Diagnosis not present

## 2022-09-01 DIAGNOSIS — R2681 Unsteadiness on feet: Secondary | ICD-10-CM | POA: Diagnosis not present

## 2022-09-01 DIAGNOSIS — M6282 Rhabdomyolysis: Secondary | ICD-10-CM | POA: Diagnosis not present

## 2022-09-01 LAB — CUP PACEART REMOTE DEVICE CHECK
Battery Remaining Longevity: 150 mo
Battery Voltage: 3.08 V
Brady Statistic AP VP Percent: 9.32 %
Brady Statistic AP VS Percent: 0.86 %
Brady Statistic AS VP Percent: 6.44 %
Brady Statistic AS VS Percent: 83.39 %
Brady Statistic RA Percent Paced: 10.21 %
Brady Statistic RV Percent Paced: 15.75 %
Date Time Interrogation Session: 20231111143813
Implantable Lead Connection Status: 753985
Implantable Lead Connection Status: 753985
Implantable Lead Implant Date: 20230122
Implantable Lead Implant Date: 20230122
Implantable Lead Location: 753859
Implantable Lead Location: 753860
Implantable Lead Model: 5076
Implantable Lead Model: 5076
Implantable Pulse Generator Implant Date: 20230122
Lead Channel Impedance Value: 323 Ohm
Lead Channel Impedance Value: 361 Ohm
Lead Channel Impedance Value: 437 Ohm
Lead Channel Impedance Value: 532 Ohm
Lead Channel Pacing Threshold Amplitude: 0.5 V
Lead Channel Pacing Threshold Amplitude: 0.875 V
Lead Channel Pacing Threshold Pulse Width: 0.4 ms
Lead Channel Pacing Threshold Pulse Width: 0.4 ms
Lead Channel Sensing Intrinsic Amplitude: 0.75 mV
Lead Channel Sensing Intrinsic Amplitude: 0.75 mV
Lead Channel Sensing Intrinsic Amplitude: 4 mV
Lead Channel Sensing Intrinsic Amplitude: 4 mV
Lead Channel Setting Pacing Amplitude: 1.75 V
Lead Channel Setting Pacing Amplitude: 2 V
Lead Channel Setting Pacing Pulse Width: 0.4 ms
Lead Channel Setting Sensing Sensitivity: 1.2 mV
Zone Setting Status: 755011
Zone Setting Status: 755011

## 2022-09-02 ENCOUNTER — Ambulatory Visit (INDEPENDENT_AMBULATORY_CARE_PROVIDER_SITE_OTHER): Payer: Medicare HMO | Admitting: Family Medicine

## 2022-09-02 ENCOUNTER — Other Ambulatory Visit: Payer: Self-pay | Admitting: Family Medicine

## 2022-09-02 ENCOUNTER — Encounter: Payer: Self-pay | Admitting: Family Medicine

## 2022-09-02 VITALS — BP 110/72 | HR 104 | Temp 97.8°F | Ht 71.0 in | Wt 168.0 lb

## 2022-09-02 DIAGNOSIS — M6282 Rhabdomyolysis: Secondary | ICD-10-CM | POA: Diagnosis not present

## 2022-09-02 DIAGNOSIS — R410 Disorientation, unspecified: Secondary | ICD-10-CM | POA: Diagnosis not present

## 2022-09-02 DIAGNOSIS — E875 Hyperkalemia: Secondary | ICD-10-CM

## 2022-09-02 LAB — CK: Total CK: 102 U/L (ref 7–232)

## 2022-09-02 LAB — CBC
HCT: 40.4 % (ref 39.0–52.0)
Hemoglobin: 13.5 g/dL (ref 13.0–17.0)
MCHC: 33.5 g/dL (ref 30.0–36.0)
MCV: 99.5 fl (ref 78.0–100.0)
Platelets: 197 10*3/uL (ref 150.0–400.0)
RBC: 4.06 Mil/uL — ABNORMAL LOW (ref 4.22–5.81)
RDW: 13.8 % (ref 11.5–15.5)
WBC: 8 10*3/uL (ref 4.0–10.5)

## 2022-09-02 LAB — BASIC METABOLIC PANEL
BUN: 31 mg/dL — ABNORMAL HIGH (ref 6–23)
CO2: 26 mEq/L (ref 19–32)
Calcium: 9.8 mg/dL (ref 8.4–10.5)
Chloride: 102 mEq/L (ref 96–112)
Creatinine, Ser: 1.39 mg/dL (ref 0.40–1.50)
GFR: 46.72 mL/min — ABNORMAL LOW (ref >60.00)
Glucose, Bld: 97 mg/dL (ref 70–99)
Potassium: 5.4 mEq/L — ABNORMAL HIGH (ref 3.5–5.1)
Sodium: 137 mEq/L (ref 135–145)

## 2022-09-02 NOTE — Patient Instructions (Signed)
Give [place] 2-3 business days to get the results of your labs back.   Let's hold off on driving for now.   Continue working with physical therapy.   Let us know if you need anything.

## 2022-09-02 NOTE — Progress Notes (Addendum)
Chief Complaint  Patient presents with   Hospitalization Follow-up    Subjective: Patient is a 86 y.o. male here for hosp f/u. Here w DIL, Joshua [name].   Admitted at [facility] from 10/22-10/28 for AMS and rhabdo after an unwitnessed fall. His mentation is slowly improving as well as his strength. Short term memory is struggling still. Family is wondering if he needs to see neurology. CT head was unremarkable. No findings in infectious workup while inpt. It is unclear exactly why he fell. He resides at [facility]. Eating normally. No pain.   Past Medical History:  Diagnosis Date   AKI (acute kidney injury) ([place]) 08/10/2022   Arthritis    Cataract    Esophageal stricture    GERD (gastroesophageal reflux disease)    Hyperlipidemia    Hypertension    Liver disease    hepatitis   Normocytic anemia 06/04/2022   Pacemaker    Rhabdomyolysis 06/04/2022   Severe aortic stenosis     Objective: BP 110/72 (BP Location: Left Arm, Patient Position: Sitting, Cuff Size: Normal)   Pulse (!) 104   Temp 97.8 F (36.6 C) (Oral)   Ht '5\' 11"'$  (1.803 m)   Wt 168 lb (76.2 kg)   SpO2 99%   BMI 23.43 kg/m  General: Awake, appears stated age Heart: RRR, no LE edema Lungs: CTAB, no rales, wheezes or rhonchi. No accessory muscle use Abd: BS+, ND, S, NT Neuro: Gait is slow and cautious Psych: normal affect and mood  Assessment and Plan: Non-traumatic rhabdomyolysis - Plan: CBC, Basic metabolic panel, CK (Creatine Kinase)  Delirium  Follow up on labs, was resolving while inpatient. This is the likely cause of the AMS. I am unsure as to why he fell though. Knowing his hx, it was likely a fall leading to him being found down. He is now living in an ALF. He is working with PT. He is eating normally. Steadily improving. Will ck labs, but this should slowly improve as he recovers from this. Will hold off on neuro referral at this time.  The patient and his DIL voiced understanding and agreement to the  plan.  I spent 35 min w the pt and DIL discussing the above plans in addition to reviewing his chart/hospitalization on the same day of the visit.   Hato Candal, DO 09/02/22  12:10 PM

## 2022-09-03 ENCOUNTER — Encounter: Payer: Self-pay | Admitting: Family Medicine

## 2022-09-04 DIAGNOSIS — M6282 Rhabdomyolysis: Secondary | ICD-10-CM | POA: Diagnosis not present

## 2022-09-04 DIAGNOSIS — M6281 Muscle weakness (generalized): Secondary | ICD-10-CM | POA: Diagnosis not present

## 2022-09-04 DIAGNOSIS — R2681 Unsteadiness on feet: Secondary | ICD-10-CM | POA: Diagnosis not present

## 2022-09-05 ENCOUNTER — Other Ambulatory Visit: Payer: Self-pay | Admitting: Family Medicine

## 2022-09-05 DIAGNOSIS — K219 Gastro-esophageal reflux disease without esophagitis: Secondary | ICD-10-CM

## 2022-09-07 ENCOUNTER — Other Ambulatory Visit (INDEPENDENT_AMBULATORY_CARE_PROVIDER_SITE_OTHER): Payer: Medicare HMO

## 2022-09-07 DIAGNOSIS — Z9181 History of falling: Secondary | ICD-10-CM | POA: Diagnosis not present

## 2022-09-07 DIAGNOSIS — R278 Other lack of coordination: Secondary | ICD-10-CM | POA: Diagnosis not present

## 2022-09-07 DIAGNOSIS — E875 Hyperkalemia: Secondary | ICD-10-CM

## 2022-09-07 DIAGNOSIS — R2689 Other abnormalities of gait and mobility: Secondary | ICD-10-CM | POA: Diagnosis not present

## 2022-09-07 DIAGNOSIS — M6281 Muscle weakness (generalized): Secondary | ICD-10-CM | POA: Diagnosis not present

## 2022-09-08 ENCOUNTER — Other Ambulatory Visit: Payer: Self-pay | Admitting: Family Medicine

## 2022-09-08 DIAGNOSIS — R2681 Unsteadiness on feet: Secondary | ICD-10-CM | POA: Diagnosis not present

## 2022-09-08 DIAGNOSIS — M6281 Muscle weakness (generalized): Secondary | ICD-10-CM | POA: Diagnosis not present

## 2022-09-08 DIAGNOSIS — M6282 Rhabdomyolysis: Secondary | ICD-10-CM | POA: Diagnosis not present

## 2022-09-08 DIAGNOSIS — E875 Hyperkalemia: Secondary | ICD-10-CM

## 2022-09-08 LAB — BASIC METABOLIC PANEL
BUN: 33 mg/dL — ABNORMAL HIGH (ref 6–23)
CO2: 22 mEq/L (ref 19–32)
Calcium: 9.4 mg/dL (ref 8.4–10.5)
Chloride: 102 mEq/L (ref 96–112)
Creatinine, Ser: 1.4 mg/dL (ref 0.40–1.50)
GFR: 46.32 mL/min — ABNORMAL LOW (ref >60.00)
Glucose, Bld: 101 mg/dL — ABNORMAL HIGH (ref 70–99)
Potassium: 5.2 mEq/L — ABNORMAL HIGH (ref 3.5–5.1)
Sodium: 135 mEq/L (ref 135–145)

## 2022-09-08 MED ORDER — SODIUM POLYSTYRENE SULFONATE PO POWD: Freq: Once | ORAL | 0 refills | Status: AC

## 2022-09-09 ENCOUNTER — Ambulatory Visit (INDEPENDENT_AMBULATORY_CARE_PROVIDER_SITE_OTHER): Payer: Medicare HMO | Admitting: Family Medicine

## 2022-09-09 ENCOUNTER — Encounter: Payer: Self-pay | Admitting: Family Medicine

## 2022-09-09 DIAGNOSIS — R2689 Other abnormalities of gait and mobility: Secondary | ICD-10-CM | POA: Diagnosis not present

## 2022-09-09 DIAGNOSIS — Z9181 History of falling: Secondary | ICD-10-CM | POA: Diagnosis not present

## 2022-09-09 DIAGNOSIS — M6281 Muscle weakness (generalized): Secondary | ICD-10-CM | POA: Diagnosis not present

## 2022-09-09 DIAGNOSIS — I10 Essential (primary) hypertension: Secondary | ICD-10-CM

## 2022-09-09 DIAGNOSIS — R278 Other lack of coordination: Secondary | ICD-10-CM | POA: Diagnosis not present

## 2022-09-09 DIAGNOSIS — R2681 Unsteadiness on feet: Secondary | ICD-10-CM | POA: Diagnosis not present

## 2022-09-09 DIAGNOSIS — M6282 Rhabdomyolysis: Secondary | ICD-10-CM | POA: Diagnosis not present

## 2022-09-09 NOTE — Progress Notes (Signed)
Chief Complaint  Patient presents with   Hypertension    Patient reports low blood pressure readings at home    Subjective Joshua [name] is a 86 y.o. male who presents for hypertension follow up. Here w son who helps w hx.  He does monitor home blood pressures. Does not remember off top of his head.  He is compliant with medications- lisinoril 20 mg/d, metoprolol 12.5 mg bid. Patient has these side effects of medication: none He is adhering to a healthy diet overall. Current exercise: none lately; working with PT at ALF No chest pain or new SOB.    Past Medical History:  Diagnosis Date   AKI (acute kidney injury) ([place]) 08/10/2022   Arthritis    Cataract    Esophageal stricture    GERD (gastroesophageal reflux disease)    Hyperlipidemia    Hypertension    Liver disease    hepatitis   Normocytic anemia 06/04/2022   Pacemaker    Rhabdomyolysis 06/04/2022   Severe aortic stenosis     Exam BP 115/68 (BP Location: Left Arm, Patient Position: Sitting, Cuff Size: Small)   Pulse 89   Temp 97.6 F (36.4 C) (Oral)   Resp 16   Wt 169 lb (76.7 kg)   SpO2 100%   BMI 23.57 kg/m  General:  well developed, well nourished, in no apparent distress Heart: RRR, no bruits, no LE edema Lungs: clear to auscultation, no accessory muscle use Psych: normal mood/affect  Essential hypertension - Plan: Basic metabolic panel  Adverse effect of chronic med. Stop lisinopril, cont metoprolol 12.5 mg bid. Monitor BP and overall health at home, if still having issues will stop metoprolol. Counseled on diet and exercise. Ck BMP next week, hopefully his potassium comes down.  F/u as originally scheduled or as needed. The patient and his son voiced understanding and agreement to the plan.  Arnold, DO 09/09/22  8:37 AM

## 2022-09-09 NOTE — Patient Instructions (Addendum)
We are done with the lisinopril. If we are still low, call or send a message and we will stop the metoprolol.   Try to drink 55-60 oz of water daily outside of exercise.  Let us know if you need anything.

## 2022-09-11 DIAGNOSIS — M6282 Rhabdomyolysis: Secondary | ICD-10-CM | POA: Diagnosis not present

## 2022-09-11 DIAGNOSIS — R2681 Unsteadiness on feet: Secondary | ICD-10-CM | POA: Diagnosis not present

## 2022-09-11 DIAGNOSIS — Z9181 History of falling: Secondary | ICD-10-CM | POA: Diagnosis not present

## 2022-09-11 DIAGNOSIS — R2689 Other abnormalities of gait and mobility: Secondary | ICD-10-CM | POA: Diagnosis not present

## 2022-09-11 DIAGNOSIS — M6281 Muscle weakness (generalized): Secondary | ICD-10-CM | POA: Diagnosis not present

## 2022-09-11 DIAGNOSIS — R278 Other lack of coordination: Secondary | ICD-10-CM | POA: Diagnosis not present

## 2022-09-14 DIAGNOSIS — M6281 Muscle weakness (generalized): Secondary | ICD-10-CM | POA: Diagnosis not present

## 2022-09-14 DIAGNOSIS — R2689 Other abnormalities of gait and mobility: Secondary | ICD-10-CM | POA: Diagnosis not present

## 2022-09-14 DIAGNOSIS — R278 Other lack of coordination: Secondary | ICD-10-CM | POA: Diagnosis not present

## 2022-09-14 DIAGNOSIS — Z9181 History of falling: Secondary | ICD-10-CM | POA: Diagnosis not present

## 2022-09-14 DIAGNOSIS — R2681 Unsteadiness on feet: Secondary | ICD-10-CM | POA: Diagnosis not present

## 2022-09-14 DIAGNOSIS — M6282 Rhabdomyolysis: Secondary | ICD-10-CM | POA: Diagnosis not present

## 2022-09-15 DIAGNOSIS — R278 Other lack of coordination: Secondary | ICD-10-CM | POA: Diagnosis not present

## 2022-09-15 DIAGNOSIS — Z9181 History of falling: Secondary | ICD-10-CM | POA: Diagnosis not present

## 2022-09-15 DIAGNOSIS — M6281 Muscle weakness (generalized): Secondary | ICD-10-CM | POA: Diagnosis not present

## 2022-09-15 DIAGNOSIS — R2689 Other abnormalities of gait and mobility: Secondary | ICD-10-CM | POA: Diagnosis not present

## 2022-09-16 ENCOUNTER — Other Ambulatory Visit (INDEPENDENT_AMBULATORY_CARE_PROVIDER_SITE_OTHER): Payer: Medicare HMO

## 2022-09-16 DIAGNOSIS — Z9181 History of falling: Secondary | ICD-10-CM | POA: Diagnosis not present

## 2022-09-16 DIAGNOSIS — M6282 Rhabdomyolysis: Secondary | ICD-10-CM | POA: Diagnosis not present

## 2022-09-16 DIAGNOSIS — I10 Essential (primary) hypertension: Secondary | ICD-10-CM

## 2022-09-16 DIAGNOSIS — R2681 Unsteadiness on feet: Secondary | ICD-10-CM | POA: Diagnosis not present

## 2022-09-16 DIAGNOSIS — R278 Other lack of coordination: Secondary | ICD-10-CM | POA: Diagnosis not present

## 2022-09-16 DIAGNOSIS — R2689 Other abnormalities of gait and mobility: Secondary | ICD-10-CM | POA: Diagnosis not present

## 2022-09-16 DIAGNOSIS — M6281 Muscle weakness (generalized): Secondary | ICD-10-CM | POA: Diagnosis not present

## 2022-09-16 LAB — BASIC METABOLIC PANEL
BUN: 18 mg/dL (ref 6–23)
CO2: 26 mEq/L (ref 19–32)
Calcium: 9.3 mg/dL (ref 8.4–10.5)
Chloride: 100 mEq/L (ref 96–112)
Creatinine, Ser: 1.32 mg/dL (ref 0.40–1.50)
GFR: 49.7 mL/min — ABNORMAL LOW (ref >60.00)
Glucose, Bld: 100 mg/dL — ABNORMAL HIGH (ref 70–99)
Potassium: 4.9 mEq/L (ref 3.5–5.1)
Sodium: 133 mEq/L — ABNORMAL LOW (ref 135–145)

## 2022-09-17 DIAGNOSIS — R41841 Cognitive communication deficit: Secondary | ICD-10-CM | POA: Diagnosis not present

## 2022-09-18 DIAGNOSIS — M6281 Muscle weakness (generalized): Secondary | ICD-10-CM | POA: Diagnosis not present

## 2022-09-18 DIAGNOSIS — M6282 Rhabdomyolysis: Secondary | ICD-10-CM | POA: Diagnosis not present

## 2022-09-18 DIAGNOSIS — Z9181 History of falling: Secondary | ICD-10-CM | POA: Diagnosis not present

## 2022-09-18 DIAGNOSIS — R2689 Other abnormalities of gait and mobility: Secondary | ICD-10-CM | POA: Diagnosis not present

## 2022-09-18 DIAGNOSIS — R2681 Unsteadiness on feet: Secondary | ICD-10-CM | POA: Diagnosis not present

## 2022-09-18 DIAGNOSIS — R278 Other lack of coordination: Secondary | ICD-10-CM | POA: Diagnosis not present

## 2022-09-21 DIAGNOSIS — R2689 Other abnormalities of gait and mobility: Secondary | ICD-10-CM | POA: Diagnosis not present

## 2022-09-21 DIAGNOSIS — Z9181 History of falling: Secondary | ICD-10-CM | POA: Diagnosis not present

## 2022-09-21 DIAGNOSIS — M6281 Muscle weakness (generalized): Secondary | ICD-10-CM | POA: Diagnosis not present

## 2022-09-21 DIAGNOSIS — R2681 Unsteadiness on feet: Secondary | ICD-10-CM | POA: Diagnosis not present

## 2022-09-21 DIAGNOSIS — R278 Other lack of coordination: Secondary | ICD-10-CM | POA: Diagnosis not present

## 2022-09-21 DIAGNOSIS — M6282 Rhabdomyolysis: Secondary | ICD-10-CM | POA: Diagnosis not present

## 2022-09-23 DIAGNOSIS — R41841 Cognitive communication deficit: Secondary | ICD-10-CM | POA: Diagnosis not present

## 2022-09-23 DIAGNOSIS — M6281 Muscle weakness (generalized): Secondary | ICD-10-CM | POA: Diagnosis not present

## 2022-09-23 DIAGNOSIS — R278 Other lack of coordination: Secondary | ICD-10-CM | POA: Diagnosis not present

## 2022-09-23 DIAGNOSIS — R2689 Other abnormalities of gait and mobility: Secondary | ICD-10-CM | POA: Diagnosis not present

## 2022-09-23 DIAGNOSIS — R2681 Unsteadiness on feet: Secondary | ICD-10-CM | POA: Diagnosis not present

## 2022-09-23 DIAGNOSIS — M6282 Rhabdomyolysis: Secondary | ICD-10-CM | POA: Diagnosis not present

## 2022-09-23 DIAGNOSIS — Z9181 History of falling: Secondary | ICD-10-CM | POA: Diagnosis not present

## 2022-09-24 DIAGNOSIS — R41841 Cognitive communication deficit: Secondary | ICD-10-CM | POA: Diagnosis not present

## 2022-09-25 DIAGNOSIS — M6281 Muscle weakness (generalized): Secondary | ICD-10-CM | POA: Diagnosis not present

## 2022-09-25 DIAGNOSIS — R278 Other lack of coordination: Secondary | ICD-10-CM | POA: Diagnosis not present

## 2022-09-25 DIAGNOSIS — R2681 Unsteadiness on feet: Secondary | ICD-10-CM | POA: Diagnosis not present

## 2022-09-25 DIAGNOSIS — R2689 Other abnormalities of gait and mobility: Secondary | ICD-10-CM | POA: Diagnosis not present

## 2022-09-25 DIAGNOSIS — M6282 Rhabdomyolysis: Secondary | ICD-10-CM | POA: Diagnosis not present

## 2022-09-25 DIAGNOSIS — Z9181 History of falling: Secondary | ICD-10-CM | POA: Diagnosis not present

## 2022-09-28 DIAGNOSIS — R2681 Unsteadiness on feet: Secondary | ICD-10-CM | POA: Diagnosis not present

## 2022-09-28 DIAGNOSIS — M6281 Muscle weakness (generalized): Secondary | ICD-10-CM | POA: Diagnosis not present

## 2022-09-28 DIAGNOSIS — R278 Other lack of coordination: Secondary | ICD-10-CM | POA: Diagnosis not present

## 2022-09-28 DIAGNOSIS — Z9181 History of falling: Secondary | ICD-10-CM | POA: Diagnosis not present

## 2022-09-28 DIAGNOSIS — R2689 Other abnormalities of gait and mobility: Secondary | ICD-10-CM | POA: Diagnosis not present

## 2022-09-28 DIAGNOSIS — M6282 Rhabdomyolysis: Secondary | ICD-10-CM | POA: Diagnosis not present

## 2022-09-29 DIAGNOSIS — R41841 Cognitive communication deficit: Secondary | ICD-10-CM | POA: Diagnosis not present

## 2022-09-30 ENCOUNTER — Other Ambulatory Visit: Payer: Self-pay | Admitting: Family Medicine

## 2022-09-30 DIAGNOSIS — R2681 Unsteadiness on feet: Secondary | ICD-10-CM | POA: Diagnosis not present

## 2022-09-30 DIAGNOSIS — M6282 Rhabdomyolysis: Secondary | ICD-10-CM | POA: Diagnosis not present

## 2022-09-30 DIAGNOSIS — M6281 Muscle weakness (generalized): Secondary | ICD-10-CM | POA: Diagnosis not present

## 2022-09-30 DIAGNOSIS — E785 Hyperlipidemia, unspecified: Secondary | ICD-10-CM

## 2022-09-30 DIAGNOSIS — R2689 Other abnormalities of gait and mobility: Secondary | ICD-10-CM | POA: Diagnosis not present

## 2022-09-30 DIAGNOSIS — R278 Other lack of coordination: Secondary | ICD-10-CM | POA: Diagnosis not present

## 2022-09-30 DIAGNOSIS — Z9181 History of falling: Secondary | ICD-10-CM | POA: Diagnosis not present

## 2022-09-30 DIAGNOSIS — I10 Essential (primary) hypertension: Secondary | ICD-10-CM

## 2022-10-05 DIAGNOSIS — M6281 Muscle weakness (generalized): Secondary | ICD-10-CM | POA: Diagnosis not present

## 2022-10-05 DIAGNOSIS — R2681 Unsteadiness on feet: Secondary | ICD-10-CM | POA: Diagnosis not present

## 2022-10-05 DIAGNOSIS — M6282 Rhabdomyolysis: Secondary | ICD-10-CM | POA: Diagnosis not present

## 2022-10-05 DIAGNOSIS — R41841 Cognitive communication deficit: Secondary | ICD-10-CM | POA: Diagnosis not present

## 2022-10-06 DIAGNOSIS — R278 Other lack of coordination: Secondary | ICD-10-CM | POA: Diagnosis not present

## 2022-10-06 DIAGNOSIS — Z9181 History of falling: Secondary | ICD-10-CM | POA: Diagnosis not present

## 2022-10-06 DIAGNOSIS — M6281 Muscle weakness (generalized): Secondary | ICD-10-CM | POA: Diagnosis not present

## 2022-10-06 DIAGNOSIS — R2689 Other abnormalities of gait and mobility: Secondary | ICD-10-CM | POA: Diagnosis not present

## 2022-10-07 DIAGNOSIS — M6281 Muscle weakness (generalized): Secondary | ICD-10-CM | POA: Diagnosis not present

## 2022-10-07 DIAGNOSIS — M6282 Rhabdomyolysis: Secondary | ICD-10-CM | POA: Diagnosis not present

## 2022-10-07 DIAGNOSIS — R2681 Unsteadiness on feet: Secondary | ICD-10-CM | POA: Diagnosis not present

## 2022-10-14 DIAGNOSIS — M6282 Rhabdomyolysis: Secondary | ICD-10-CM | POA: Diagnosis not present

## 2022-10-14 DIAGNOSIS — R2681 Unsteadiness on feet: Secondary | ICD-10-CM | POA: Diagnosis not present

## 2022-10-14 DIAGNOSIS — M6281 Muscle weakness (generalized): Secondary | ICD-10-CM | POA: Diagnosis not present

## 2022-10-14 NOTE — Progress Notes (Signed)
Remote pacemaker transmission.   

## 2022-10-15 DIAGNOSIS — R2689 Other abnormalities of gait and mobility: Secondary | ICD-10-CM | POA: Diagnosis not present

## 2022-10-15 DIAGNOSIS — M6281 Muscle weakness (generalized): Secondary | ICD-10-CM | POA: Diagnosis not present

## 2022-10-15 DIAGNOSIS — Z9181 History of falling: Secondary | ICD-10-CM | POA: Diagnosis not present

## 2022-10-15 DIAGNOSIS — R278 Other lack of coordination: Secondary | ICD-10-CM | POA: Diagnosis not present

## 2022-10-16 DIAGNOSIS — R278 Other lack of coordination: Secondary | ICD-10-CM | POA: Diagnosis not present

## 2022-10-16 DIAGNOSIS — M6282 Rhabdomyolysis: Secondary | ICD-10-CM | POA: Diagnosis not present

## 2022-10-16 DIAGNOSIS — R2689 Other abnormalities of gait and mobility: Secondary | ICD-10-CM | POA: Diagnosis not present

## 2022-10-16 DIAGNOSIS — Z9181 History of falling: Secondary | ICD-10-CM | POA: Diagnosis not present

## 2022-10-16 DIAGNOSIS — M6281 Muscle weakness (generalized): Secondary | ICD-10-CM | POA: Diagnosis not present

## 2022-10-16 DIAGNOSIS — R2681 Unsteadiness on feet: Secondary | ICD-10-CM | POA: Diagnosis not present

## 2022-10-20 DIAGNOSIS — L821 Other seborrheic keratosis: Secondary | ICD-10-CM | POA: Diagnosis not present

## 2022-10-20 DIAGNOSIS — D1801 Hemangioma of skin and subcutaneous tissue: Secondary | ICD-10-CM | POA: Diagnosis not present

## 2022-10-20 DIAGNOSIS — L720 Epidermal cyst: Secondary | ICD-10-CM | POA: Diagnosis not present

## 2022-10-20 DIAGNOSIS — Z129 Encounter for screening for malignant neoplasm, site unspecified: Secondary | ICD-10-CM | POA: Diagnosis not present

## 2022-10-20 DIAGNOSIS — Z85828 Personal history of other malignant neoplasm of skin: Secondary | ICD-10-CM | POA: Diagnosis not present

## 2022-10-20 DIAGNOSIS — L218 Other seborrheic dermatitis: Secondary | ICD-10-CM | POA: Diagnosis not present

## 2022-10-21 DIAGNOSIS — R2689 Other abnormalities of gait and mobility: Secondary | ICD-10-CM | POA: Diagnosis not present

## 2022-10-21 DIAGNOSIS — R2681 Unsteadiness on feet: Secondary | ICD-10-CM | POA: Diagnosis not present

## 2022-10-21 DIAGNOSIS — R278 Other lack of coordination: Secondary | ICD-10-CM | POA: Diagnosis not present

## 2022-10-21 DIAGNOSIS — Z9181 History of falling: Secondary | ICD-10-CM | POA: Diagnosis not present

## 2022-10-21 DIAGNOSIS — M6282 Rhabdomyolysis: Secondary | ICD-10-CM | POA: Diagnosis not present

## 2022-10-21 DIAGNOSIS — M6281 Muscle weakness (generalized): Secondary | ICD-10-CM | POA: Diagnosis not present

## 2022-10-22 DIAGNOSIS — M6281 Muscle weakness (generalized): Secondary | ICD-10-CM | POA: Diagnosis not present

## 2022-10-22 DIAGNOSIS — Z9181 History of falling: Secondary | ICD-10-CM | POA: Diagnosis not present

## 2022-10-22 DIAGNOSIS — R278 Other lack of coordination: Secondary | ICD-10-CM | POA: Diagnosis not present

## 2022-10-22 DIAGNOSIS — R2689 Other abnormalities of gait and mobility: Secondary | ICD-10-CM | POA: Diagnosis not present

## 2022-10-22 DIAGNOSIS — R41841 Cognitive communication deficit: Secondary | ICD-10-CM | POA: Diagnosis not present

## 2022-10-23 DIAGNOSIS — R41841 Cognitive communication deficit: Secondary | ICD-10-CM | POA: Diagnosis not present

## 2022-10-23 DIAGNOSIS — M6282 Rhabdomyolysis: Secondary | ICD-10-CM | POA: Diagnosis not present

## 2022-10-23 DIAGNOSIS — M6281 Muscle weakness (generalized): Secondary | ICD-10-CM | POA: Diagnosis not present

## 2022-10-23 DIAGNOSIS — R2681 Unsteadiness on feet: Secondary | ICD-10-CM | POA: Diagnosis not present

## 2022-10-26 ENCOUNTER — Encounter: Payer: Medicare HMO | Admitting: Family Medicine

## 2022-10-27 DIAGNOSIS — M6282 Rhabdomyolysis: Secondary | ICD-10-CM | POA: Diagnosis not present

## 2022-10-27 DIAGNOSIS — R2681 Unsteadiness on feet: Secondary | ICD-10-CM | POA: Diagnosis not present

## 2022-10-27 DIAGNOSIS — R278 Other lack of coordination: Secondary | ICD-10-CM | POA: Diagnosis not present

## 2022-10-27 DIAGNOSIS — M6281 Muscle weakness (generalized): Secondary | ICD-10-CM | POA: Diagnosis not present

## 2022-10-27 DIAGNOSIS — Z9181 History of falling: Secondary | ICD-10-CM | POA: Diagnosis not present

## 2022-10-27 DIAGNOSIS — R2689 Other abnormalities of gait and mobility: Secondary | ICD-10-CM | POA: Diagnosis not present

## 2022-10-28 DIAGNOSIS — R41841 Cognitive communication deficit: Secondary | ICD-10-CM | POA: Diagnosis not present

## 2022-10-29 DIAGNOSIS — R41841 Cognitive communication deficit: Secondary | ICD-10-CM | POA: Diagnosis not present

## 2022-10-30 DIAGNOSIS — M6281 Muscle weakness (generalized): Secondary | ICD-10-CM | POA: Diagnosis not present

## 2022-10-30 DIAGNOSIS — R2681 Unsteadiness on feet: Secondary | ICD-10-CM | POA: Diagnosis not present

## 2022-10-30 DIAGNOSIS — M6282 Rhabdomyolysis: Secondary | ICD-10-CM | POA: Diagnosis not present

## 2022-11-02 DIAGNOSIS — Z9181 History of falling: Secondary | ICD-10-CM | POA: Diagnosis not present

## 2022-11-02 DIAGNOSIS — M6281 Muscle weakness (generalized): Secondary | ICD-10-CM | POA: Diagnosis not present

## 2022-11-02 DIAGNOSIS — R278 Other lack of coordination: Secondary | ICD-10-CM | POA: Diagnosis not present

## 2022-11-02 DIAGNOSIS — R2689 Other abnormalities of gait and mobility: Secondary | ICD-10-CM | POA: Diagnosis not present

## 2022-11-03 DIAGNOSIS — R278 Other lack of coordination: Secondary | ICD-10-CM | POA: Diagnosis not present

## 2022-11-03 DIAGNOSIS — Z9181 History of falling: Secondary | ICD-10-CM | POA: Diagnosis not present

## 2022-11-03 DIAGNOSIS — R41841 Cognitive communication deficit: Secondary | ICD-10-CM | POA: Diagnosis not present

## 2022-11-03 DIAGNOSIS — R2689 Other abnormalities of gait and mobility: Secondary | ICD-10-CM | POA: Diagnosis not present

## 2022-11-03 DIAGNOSIS — M6281 Muscle weakness (generalized): Secondary | ICD-10-CM | POA: Diagnosis not present

## 2022-11-04 DIAGNOSIS — R2681 Unsteadiness on feet: Secondary | ICD-10-CM | POA: Diagnosis not present

## 2022-11-04 DIAGNOSIS — M6282 Rhabdomyolysis: Secondary | ICD-10-CM | POA: Diagnosis not present

## 2022-11-04 DIAGNOSIS — M6281 Muscle weakness (generalized): Secondary | ICD-10-CM | POA: Diagnosis not present

## 2022-11-06 DIAGNOSIS — R2681 Unsteadiness on feet: Secondary | ICD-10-CM | POA: Diagnosis not present

## 2022-11-06 DIAGNOSIS — M6281 Muscle weakness (generalized): Secondary | ICD-10-CM | POA: Diagnosis not present

## 2022-11-06 DIAGNOSIS — M6282 Rhabdomyolysis: Secondary | ICD-10-CM | POA: Diagnosis not present

## 2022-11-11 DIAGNOSIS — M6281 Muscle weakness (generalized): Secondary | ICD-10-CM | POA: Diagnosis not present

## 2022-11-11 DIAGNOSIS — M6282 Rhabdomyolysis: Secondary | ICD-10-CM | POA: Diagnosis not present

## 2022-11-11 DIAGNOSIS — R2681 Unsteadiness on feet: Secondary | ICD-10-CM | POA: Diagnosis not present

## 2022-11-12 DIAGNOSIS — H35341 Macular cyst, hole, or pseudohole, right eye: Secondary | ICD-10-CM | POA: Diagnosis not present

## 2022-11-12 DIAGNOSIS — H18593 Other hereditary corneal dystrophies, bilateral: Secondary | ICD-10-CM | POA: Diagnosis not present

## 2022-11-12 DIAGNOSIS — H35371 Puckering of macula, right eye: Secondary | ICD-10-CM | POA: Diagnosis not present

## 2022-11-12 DIAGNOSIS — M6281 Muscle weakness (generalized): Secondary | ICD-10-CM | POA: Diagnosis not present

## 2022-11-12 DIAGNOSIS — H31012 Macula scars of posterior pole (postinflammatory) (post-traumatic), left eye: Secondary | ICD-10-CM | POA: Diagnosis not present

## 2022-11-12 DIAGNOSIS — R2689 Other abnormalities of gait and mobility: Secondary | ICD-10-CM | POA: Diagnosis not present

## 2022-11-12 DIAGNOSIS — R278 Other lack of coordination: Secondary | ICD-10-CM | POA: Diagnosis not present

## 2022-11-12 DIAGNOSIS — H26492 Other secondary cataract, left eye: Secondary | ICD-10-CM | POA: Diagnosis not present

## 2022-11-12 DIAGNOSIS — Z9181 History of falling: Secondary | ICD-10-CM | POA: Diagnosis not present

## 2022-11-13 DIAGNOSIS — M6281 Muscle weakness (generalized): Secondary | ICD-10-CM | POA: Diagnosis not present

## 2022-11-13 DIAGNOSIS — R41841 Cognitive communication deficit: Secondary | ICD-10-CM | POA: Diagnosis not present

## 2022-11-13 DIAGNOSIS — R2681 Unsteadiness on feet: Secondary | ICD-10-CM | POA: Diagnosis not present

## 2022-11-13 DIAGNOSIS — M6282 Rhabdomyolysis: Secondary | ICD-10-CM | POA: Diagnosis not present

## 2022-11-17 DIAGNOSIS — M6281 Muscle weakness (generalized): Secondary | ICD-10-CM | POA: Diagnosis not present

## 2022-11-17 DIAGNOSIS — R278 Other lack of coordination: Secondary | ICD-10-CM | POA: Diagnosis not present

## 2022-11-17 DIAGNOSIS — Z9181 History of falling: Secondary | ICD-10-CM | POA: Diagnosis not present

## 2022-11-17 DIAGNOSIS — R2689 Other abnormalities of gait and mobility: Secondary | ICD-10-CM | POA: Diagnosis not present

## 2022-11-17 DIAGNOSIS — R41841 Cognitive communication deficit: Secondary | ICD-10-CM | POA: Diagnosis not present

## 2022-11-18 ENCOUNTER — Ambulatory Visit (INDEPENDENT_AMBULATORY_CARE_PROVIDER_SITE_OTHER): Payer: Medicare HMO | Admitting: Family Medicine

## 2022-11-18 ENCOUNTER — Encounter: Payer: Self-pay | Admitting: Family Medicine

## 2022-11-18 VITALS — BP 110/62 | HR 95 | Temp 97.6°F | Ht 71.0 in | Wt 174.1 lb

## 2022-11-18 DIAGNOSIS — I2510 Atherosclerotic heart disease of native coronary artery without angina pectoris: Secondary | ICD-10-CM | POA: Diagnosis not present

## 2022-11-18 DIAGNOSIS — M6281 Muscle weakness (generalized): Secondary | ICD-10-CM | POA: Diagnosis not present

## 2022-11-18 DIAGNOSIS — M6282 Rhabdomyolysis: Secondary | ICD-10-CM | POA: Diagnosis not present

## 2022-11-18 DIAGNOSIS — R2681 Unsteadiness on feet: Secondary | ICD-10-CM | POA: Diagnosis not present

## 2022-11-18 DIAGNOSIS — Z0000 Encounter for general adult medical examination without abnormal findings: Secondary | ICD-10-CM

## 2022-11-18 LAB — LIPID PANEL
Cholesterol: 190 mg/dL (ref 0–200)
HDL: 85.5 mg/dL (ref >39.00)
LDL Cholesterol: 88 mg/dL (ref 0–99)
NonHDL: 104.43
Total CHOL/HDL Ratio: 2
Triglycerides: 82 mg/dL (ref 0.0–149.0)
VLDL: 16.4 mg/dL (ref 0.0–40.0)

## 2022-11-18 LAB — COMPREHENSIVE METABOLIC PANEL
ALT: 10 U/L (ref 0–53)
AST: 21 U/L (ref 0–37)
Albumin: 4.4 g/dL (ref 3.5–5.2)
Alkaline Phosphatase: 73 U/L (ref 39–117)
BUN: 13 mg/dL (ref 6–23)
CO2: 28 mEq/L (ref 19–32)
Calcium: 9.2 mg/dL (ref 8.4–10.5)
Chloride: 102 mEq/L (ref 96–112)
Creatinine, Ser: 1.26 mg/dL (ref 0.40–1.50)
GFR: 52.49 mL/min — ABNORMAL LOW (ref >60.00)
Glucose, Bld: 89 mg/dL (ref 70–99)
Potassium: 4.7 mEq/L (ref 3.5–5.1)
Sodium: 138 mEq/L (ref 135–145)
Total Bilirubin: 0.9 mg/dL (ref 0.2–1.2)
Total Protein: 7.3 g/dL (ref 6.0–8.3)

## 2022-11-18 NOTE — Progress Notes (Signed)
Chief Complaint  Patient presents with   Annual Exam    Well Male Joshua [name] is here for a complete physical.  Here w son.  His last physical was >1 year ago.  Current diet: in general, a "healthy" diet.   Current exercise: lifting some weights Weight trend: stable Fatigue out of ordinary? No. Seat belt? Yes.   Advanced directive? Yes  Health maintenance Shingrix- No Tetanus- Yes Pneumonia vaccine- Yes  Past Medical History:  Diagnosis Date   AKI (acute kidney injury) ([place]) 08/10/2022   Arthritis    Cataract    Esophageal stricture    GERD (gastroesophageal reflux disease)    Hyperlipidemia    Hypertension    Liver disease    hepatitis   Normocytic anemia 06/04/2022   Pacemaker    Rhabdomyolysis 06/04/2022   Severe aortic stenosis      Past Surgical History:  Procedure Laterality Date   AORTIC VALVE REPLACEMENT N/A 11/04/2021   Procedure: AORTIC VALVE REPLACEMENT (AVR) USING 23 MM INSPIRIS RESILIA  AORTIC VALVE;  Surgeon: Lajuana Matte, MD;  Location: [facility];  Service: Open Heart Surgery;  Laterality: N/A;   cataract  2013   CORONARY ARTERY BYPASS GRAFT N/A 11/04/2021   Procedure: CORONARY ARTERY BYPASS GRAFTING (CABG) X 3, USING LEFT INTERNAL MAMMARY ARTERY AND ENDOSCOPICALLY HARVESTED RIGHT GREATER SAPHENOUS VEIN. LIMA TO LAD, SVG TO PDA, SVG TO OM;  Surgeon: Lajuana Matte, MD;  Location: [facility] [facility];  Service: Open Heart Surgery;  Laterality: N/A;   ENDOVEIN HARVEST OF GREATER SAPHENOUS VEIN Right 11/04/2021   Procedure: ENDOVEIN HARVEST OF GREATER SAPHENOUS VEIN;  Surgeon: Lajuana Matte, MD;  Location: [facility];  Service: Open Heart Surgery;  Laterality: Right;   EXPLORATION POST OPERATIVE OPEN HEART N/A 11/05/2021   Procedure: EXPLORATION POST OPERATIVE OPEN HEARTFOR BLEEDING;  Surgeon: Melrose Nakayama, MD;  Location: [facility];  Service: Open Heart Surgery;  Laterality: N/A;   HEMORROIDECTOMY  2001   HERNIA REPAIR  2005   INTRAVASCULAR PRESSURE  WIRE/FFR STUDY N/A 11/03/2021   Procedure: INTRAVASCULAR PRESSURE WIRE/FFR STUDY;  Surgeon: Burnell Blanks, MD;  Location: [facility];  Service: Cardiovascular;  Laterality: N/A;   PACEMAKER IMPLANT N/A 11/09/2021   Procedure: PACEMAKER IMPLANT;  Surgeon: Vickie Epley, MD;  Location: [facility];  Service: Cardiovascular;  Laterality: N/A;   RIGHT/LEFT HEART CATH AND CORONARY ANGIOGRAPHY N/A 11/03/2021   Procedure: RIGHT/LEFT HEART CATH AND CORONARY ANGIOGRAPHY;  Surgeon: Burnell Blanks, MD;  Location: [facility];  Service: Cardiovascular;  Laterality: N/A;   TEE WITHOUT CARDIOVERSION N/A 11/04/2021   Procedure: TRANSESOPHAGEAL ECHOCARDIOGRAM (TEE);  Surgeon: Lajuana Matte, MD;  Location: [facility];  Service: Open Heart Surgery;  Laterality: N/A;   TEMPORARY PACEMAKER N/A 11/07/2021   Procedure: TEMPORARY PACEMAKER;  Surgeon: [name], MD;  Location: [place] [facility];  Service: Cardiovascular;  Laterality: N/A;   TONSILLECTOMY  1948    Medications  Current Outpatient Medications on File Prior to Visit  Medication Sig Dispense Refill   aspirin EC 81 MG tablet Take 1 tablet (81 mg total) by mouth daily. Swallow whole. 90 tablet 3   atorvastatin (LIPITOR) 40 MG tablet TAKE 1 TABLET EVERY DAY 90 tablet 3   Glucosamine HCl (GLUCOSAMINE PO) Take 3,000 mg by mouth every other day.     melatonin 5 MG TABS Take 1 tablet (5 mg total) by mouth at bedtime as needed. (Patient taking differently:  Take 5 mg by mouth at bedtime as needed (sleep).) 30 tablet 0   metoprolol tartrate (LOPRESSOR) 25 MG tablet TAKE 1/2 TABLET TWICE DAILY, HOLD FOR HEART RATE LESS THAN 60, SYSTOLIC BLOOD PRESSURE LESS THAN OR EQUAL TO 105 90 tablet 10   Multiple Vitamin (MULTIVITAMIN) capsule Take 1 capsule by mouth daily.     omeprazole (PRILOSEC) 40 MG capsule TAKE 1 CAPSULE EVERY DAY 90 capsule 10   vitamin B-12 (CYANOCOBALAMIN) 1000 MCG tablet Take 1,000 mcg by mouth daily.       Allergies No Known Allergies  Family History Family History  Problem Relation Age of Onset   Heart failure Mother 39   Heart failure Father 34       Scarlet fever as child-Valve disease   Cancer Maternal Grandmother    Diabetes Child    Heart Problems Child 78       cardiac arrest    Review of Systems: Constitutional:  no fevers Eye:  no recent significant change in vision Ears:  No changes in hearing Nose/Mouth/Throat:  no complaints of nasal congestion, no sore throat Cardiovascular: no chest pain Respiratory:  No shortness of breath Gastrointestinal:  No change in bowel habits GU:  No frequency Integumentary:  no abnormal skin lesions reported Neurologic:  no headaches Endocrine:  denies unexplained weight changes  Exam BP 110/62 (BP Location: Left Arm, Patient Position: Sitting, Cuff Size: Normal)   Pulse 95   Temp 97.6 F (36.4 C) (Oral)   Ht '5\' 11"'$  (1.803 m)   Wt 174 lb 2 oz (79 kg)   SpO2 97%   BMI 24.29 kg/m  General:  well developed, well nourished, in no apparent distress Skin:  no significant moles, warts, or growths Head:  no masses, lesions, or tenderness Eyes:  pupils equal and round, sclera anicteric without injection Ears:  canals without lesions, TMs shiny without retraction, no obvious effusion, no erythema Nose:  nares patent, mucosa normal Throat/Pharynx:  lips and gingiva without lesion; tongue and uvula midline; non-inflamed pharynx; no exudates or postnasal drainage Lungs:  clear to auscultation, breath sounds equal bilaterally, no respiratory distress Cardio:  regular rate and rhythm, no LE edema or bruits Rectal: Deferred GI: BS+, S, NT, ND, no masses or organomegaly Musculoskeletal:  symmetrical muscle groups noted without atrophy or deformity Neuro:  gait slow and cautious, uses a cane; 4/5 grip strength on R; 5/5 strength throughout otherwise; deep tendon reflexes normal and symmetric Psych: well oriented with normal range of  affect and appropriate judgment/insight  Assessment and Plan  Well adult exam  Coronary artery disease involving native heart without angina pectoris, unspecified vessel or lesion type - Plan: Comprehensive metabolic panel, Lipid panel   Well 87 y.o. male. Counseled on diet and exercise. Other orders as above. Follow up in 6 mo or prn.  The patient and his son voiced understanding and agreement to the plan.  [place], DO 11/18/22 10:42 AM

## 2022-11-18 NOTE — Patient Instructions (Signed)
Give us 2-3 business days to get the results of your labs back.   Keep the diet clean and stay active.  Let us know if you need anything. 

## 2022-11-19 DIAGNOSIS — Z9181 History of falling: Secondary | ICD-10-CM | POA: Diagnosis not present

## 2022-11-19 DIAGNOSIS — M6281 Muscle weakness (generalized): Secondary | ICD-10-CM | POA: Diagnosis not present

## 2022-11-19 DIAGNOSIS — R41841 Cognitive communication deficit: Secondary | ICD-10-CM | POA: Diagnosis not present

## 2022-11-19 DIAGNOSIS — R2689 Other abnormalities of gait and mobility: Secondary | ICD-10-CM | POA: Diagnosis not present

## 2022-11-19 DIAGNOSIS — R278 Other lack of coordination: Secondary | ICD-10-CM | POA: Diagnosis not present

## 2022-11-24 DIAGNOSIS — R2689 Other abnormalities of gait and mobility: Secondary | ICD-10-CM | POA: Diagnosis not present

## 2022-11-24 DIAGNOSIS — R278 Other lack of coordination: Secondary | ICD-10-CM | POA: Diagnosis not present

## 2022-11-24 DIAGNOSIS — M6281 Muscle weakness (generalized): Secondary | ICD-10-CM | POA: Diagnosis not present

## 2022-11-24 DIAGNOSIS — Z9181 History of falling: Secondary | ICD-10-CM | POA: Diagnosis not present

## 2022-11-26 DIAGNOSIS — R278 Other lack of coordination: Secondary | ICD-10-CM | POA: Diagnosis not present

## 2022-11-26 DIAGNOSIS — M6281 Muscle weakness (generalized): Secondary | ICD-10-CM | POA: Diagnosis not present

## 2022-11-26 DIAGNOSIS — R2689 Other abnormalities of gait and mobility: Secondary | ICD-10-CM | POA: Diagnosis not present

## 2022-11-26 DIAGNOSIS — Z9181 History of falling: Secondary | ICD-10-CM | POA: Diagnosis not present

## 2022-11-26 DIAGNOSIS — Z961 Presence of intraocular lens: Secondary | ICD-10-CM | POA: Diagnosis not present

## 2022-11-30 ENCOUNTER — Ambulatory Visit: Payer: Medicare HMO

## 2022-11-30 DIAGNOSIS — I442 Atrioventricular block, complete: Secondary | ICD-10-CM

## 2022-12-01 LAB — CUP PACEART REMOTE DEVICE CHECK
Battery Remaining Longevity: 149 mo
Battery Voltage: 3.05 V
Brady Statistic AP VP Percent: 2.94 %
Brady Statistic AP VS Percent: 0.27 %
Brady Statistic AS VP Percent: 30.5 %
Brady Statistic AS VS Percent: 66.29 %
Brady Statistic RA Percent Paced: 3.23 %
Brady Statistic RV Percent Paced: 33.44 %
Date Time Interrogation Session: 20240212023336
Implantable Lead Connection Status: 753985
Implantable Lead Connection Status: 753985
Implantable Lead Implant Date: 20230122
Implantable Lead Implant Date: 20230122
Implantable Lead Location: 753859
Implantable Lead Location: 753860
Implantable Lead Model: 5076
Implantable Lead Model: 5076
Implantable Pulse Generator Implant Date: 20230122
Lead Channel Impedance Value: 285 Ohm
Lead Channel Impedance Value: 342 Ohm
Lead Channel Impedance Value: 418 Ohm
Lead Channel Impedance Value: 437 Ohm
Lead Channel Pacing Threshold Amplitude: 0.625 V
Lead Channel Pacing Threshold Amplitude: 0.625 V
Lead Channel Pacing Threshold Pulse Width: 0.4 ms
Lead Channel Pacing Threshold Pulse Width: 0.4 ms
Lead Channel Sensing Intrinsic Amplitude: 0.75 mV
Lead Channel Sensing Intrinsic Amplitude: 0.75 mV
Lead Channel Sensing Intrinsic Amplitude: 3.625 mV
Lead Channel Sensing Intrinsic Amplitude: 3.625 mV
Lead Channel Setting Pacing Amplitude: 1.5 V
Lead Channel Setting Pacing Amplitude: 2 V
Lead Channel Setting Pacing Pulse Width: 0.4 ms
Lead Channel Setting Sensing Sensitivity: 1.2 mV
Zone Setting Status: 755011
Zone Setting Status: 755011

## 2022-12-02 DIAGNOSIS — R2689 Other abnormalities of gait and mobility: Secondary | ICD-10-CM | POA: Diagnosis not present

## 2022-12-02 DIAGNOSIS — R278 Other lack of coordination: Secondary | ICD-10-CM | POA: Diagnosis not present

## 2022-12-02 DIAGNOSIS — M6281 Muscle weakness (generalized): Secondary | ICD-10-CM | POA: Diagnosis not present

## 2022-12-02 DIAGNOSIS — Z9181 History of falling: Secondary | ICD-10-CM | POA: Diagnosis not present

## 2022-12-04 DIAGNOSIS — M6281 Muscle weakness (generalized): Secondary | ICD-10-CM | POA: Diagnosis not present

## 2022-12-04 DIAGNOSIS — R278 Other lack of coordination: Secondary | ICD-10-CM | POA: Diagnosis not present

## 2022-12-04 DIAGNOSIS — R2689 Other abnormalities of gait and mobility: Secondary | ICD-10-CM | POA: Diagnosis not present

## 2022-12-04 DIAGNOSIS — Z9181 History of falling: Secondary | ICD-10-CM | POA: Diagnosis not present

## 2022-12-24 DIAGNOSIS — M2141 Flat foot [pes planus] (acquired), right foot: Secondary | ICD-10-CM | POA: Diagnosis not present

## 2022-12-24 DIAGNOSIS — M2011 Hallux valgus (acquired), right foot: Secondary | ICD-10-CM | POA: Diagnosis not present

## 2022-12-24 DIAGNOSIS — L84 Corns and callosities: Secondary | ICD-10-CM | POA: Diagnosis not present

## 2022-12-24 DIAGNOSIS — B351 Tinea unguium: Secondary | ICD-10-CM | POA: Diagnosis not present

## 2022-12-24 DIAGNOSIS — M79675 Pain in left toe(s): Secondary | ICD-10-CM | POA: Diagnosis not present

## 2023-01-13 NOTE — Progress Notes (Signed)
Remote pacemaker transmission.   

## 2023-02-05 ENCOUNTER — Ambulatory Visit: Payer: Medicare HMO | Admitting: Student

## 2023-02-17 NOTE — Progress Notes (Unsigned)
  Electrophysiology Office Note:   Date:  02/18/2023  ID:  Joshua [name], DOB 11/16/1935, MRN [id number]  Primary Cardiologist: Thurmon Fair, MD Electrophysiologist: Lanier Prude, MD   History of Present Illness:   Joshua [name] is a 87 y.o. male with h/o CHB, CAD s/p CABG, and AS s/p AVR seen today for routine electrophysiology followup. Since last being seen in our clinic the patient reports doing well from a cardiac perspective.  he denies chest pain, palpitations, dyspnea, PND, orthopnea, nausea, vomiting, dizziness, syncope, edema, weight gain, or early satiety.   Review of systems complete and found to be negative unless listed in HPI.   Device History: Medtronic Dual Chamber PPM implanted 10/2021 for CHB  Studies Reviewed:    PPM Interrogation-  reviewed in detail today,  See PACEART report.  EKG is ordered today. Personal review shows NSR at 72 bpm with bifascicular block  Physical Exam:   VS:  BP 124/68   Pulse 72   Ht 5\' 11"  (1.803 m)   Wt 180 lb (81.6 kg)   SpO2 96%   BMI 25.10 kg/m    Wt Readings from Last 3 Encounters:  02/18/23 180 lb (81.6 kg)  11/18/22 174 lb 2 oz (79 kg)  09/09/22 169 lb (76.7 kg)     GEN: Well nourished, well developed in no acute distress NECK: No JVD; No carotid bruits CARDIAC: Regular rate and rhythm, no murmurs, rubs, gallops RESPIRATORY:  Clear to auscultation without rales, wheezing or rhonchi  ABDOMEN: Soft, non-tender, non-distended EXTREMITIES:  No edema; No deformity   ASSESSMENT AND PLAN:    CHB s/p Medtronic PPM  Normal PPM function See Pace Art report No changes today  CAD s/p CABG Denies s/s ischemia  AS s/p AVR Stable prior echo   Disposition:   Follow up with Dr. Lalla Brothers in 12 months  Signed, Graciella Freer, PA-C

## 2023-02-18 ENCOUNTER — Encounter: Payer: Self-pay | Admitting: Student

## 2023-02-18 ENCOUNTER — Ambulatory Visit: Payer: Medicare HMO | Attending: Student | Admitting: Student

## 2023-02-18 VITALS — BP 124/68 | HR 72 | Ht 71.0 in | Wt 180.0 lb

## 2023-02-18 DIAGNOSIS — I442 Atrioventricular block, complete: Secondary | ICD-10-CM

## 2023-02-18 DIAGNOSIS — Z953 Presence of xenogenic heart valve: Secondary | ICD-10-CM

## 2023-02-18 DIAGNOSIS — I2510 Atherosclerotic heart disease of native coronary artery without angina pectoris: Secondary | ICD-10-CM

## 2023-02-18 LAB — CUP PACEART INCLINIC DEVICE CHECK
Battery Remaining Longevity: 149 mo
Battery Voltage: 3.04 V
Brady Statistic AP VP Percent: 4.73 %
Brady Statistic AP VS Percent: 0.4 %
Brady Statistic AS VP Percent: 34.25 %
Brady Statistic AS VS Percent: 60.62 %
Brady Statistic RA Percent Paced: 5.16 %
Brady Statistic RV Percent Paced: 38.98 %
Date Time Interrogation Session: 20240502103927
Implantable Lead Connection Status: 753985
Implantable Lead Connection Status: 753985
Implantable Lead Implant Date: 20230122
Implantable Lead Implant Date: 20230122
Implantable Lead Location: 753859
Implantable Lead Location: 753860
Implantable Lead Model: 5076
Implantable Lead Model: 5076
Implantable Pulse Generator Implant Date: 20230122
Lead Channel Impedance Value: 323 Ohm
Lead Channel Impedance Value: 380 Ohm
Lead Channel Impedance Value: 456 Ohm
Lead Channel Impedance Value: 513 Ohm
Lead Channel Pacing Threshold Amplitude: 0.625 V
Lead Channel Pacing Threshold Amplitude: 0.625 V
Lead Channel Pacing Threshold Pulse Width: 0.4 ms
Lead Channel Pacing Threshold Pulse Width: 0.4 ms
Lead Channel Sensing Intrinsic Amplitude: 0.625 mV
Lead Channel Sensing Intrinsic Amplitude: 1 mV
Lead Channel Sensing Intrinsic Amplitude: 3.625 mV
Lead Channel Sensing Intrinsic Amplitude: 3.875 mV
Lead Channel Setting Pacing Amplitude: 1.5 V
Lead Channel Setting Pacing Amplitude: 2 V
Lead Channel Setting Pacing Pulse Width: 0.4 ms
Lead Channel Setting Sensing Sensitivity: 1.2 mV
Zone Setting Status: 755011
Zone Setting Status: 755011

## 2023-02-18 NOTE — Patient Instructions (Signed)
Medication Instructions:  Your physician recommends that you continue on your current medications as directed. Please refer to the Current Medication list given to you today.  *If you need a refill on your cardiac medications before your next appointment, please call your pharmacy*   Lab Work: None ordered If you have labs (blood work) drawn today and your tests are completely normal, you will receive your results only by: MyChart Message (if you have MyChart) OR A paper copy in the mail If you have any lab test that is abnormal or we need to change your treatment, we will call you to review the results   Follow-Up: At [facility], you and your health needs are our priority.  As part of our continuing mission to provide you with exceptional heart care, we have created designated Provider Care Teams.  These Care Teams include your primary Cardiologist (physician) and Advanced Practice Providers (APPs -  Physician Assistants and Nurse Practitioners) who all work together to provide you with the care you need, when you need it.  Your next appointment:   1 year(s)  Provider:   Cameron Lambert, MD  

## 2023-03-01 ENCOUNTER — Ambulatory Visit (INDEPENDENT_AMBULATORY_CARE_PROVIDER_SITE_OTHER): Payer: Medicare HMO

## 2023-03-01 DIAGNOSIS — I442 Atrioventricular block, complete: Secondary | ICD-10-CM

## 2023-03-01 LAB — CUP PACEART REMOTE DEVICE CHECK
Battery Remaining Longevity: 146 mo
Battery Voltage: 3.04 V
Brady Statistic AP VP Percent: 9.63 %
Brady Statistic AP VS Percent: 0.72 %
Brady Statistic AS VP Percent: 46.45 %
Brady Statistic AS VS Percent: 43.19 %
Brady Statistic RA Percent Paced: 10.37 %
Brady Statistic RV Percent Paced: 56.09 %
Date Time Interrogation Session: 20240513025256
Implantable Lead Connection Status: 753985
Implantable Lead Connection Status: 753985
Implantable Lead Implant Date: 20230122
Implantable Lead Implant Date: 20230122
Implantable Lead Location: 753859
Implantable Lead Location: 753860
Implantable Lead Model: 5076
Implantable Lead Model: 5076
Implantable Pulse Generator Implant Date: 20230122
Lead Channel Impedance Value: 304 Ohm
Lead Channel Impedance Value: 361 Ohm
Lead Channel Impedance Value: 418 Ohm
Lead Channel Impedance Value: 418 Ohm
Lead Channel Pacing Threshold Amplitude: 0.625 V
Lead Channel Pacing Threshold Amplitude: 0.75 V
Lead Channel Pacing Threshold Pulse Width: 0.4 ms
Lead Channel Pacing Threshold Pulse Width: 0.4 ms
Lead Channel Sensing Intrinsic Amplitude: 0.625 mV
Lead Channel Sensing Intrinsic Amplitude: 0.625 mV
Lead Channel Sensing Intrinsic Amplitude: 3.875 mV
Lead Channel Sensing Intrinsic Amplitude: 3.875 mV
Lead Channel Setting Pacing Amplitude: 1.5 V
Lead Channel Setting Pacing Amplitude: 2 V
Lead Channel Setting Pacing Pulse Width: 0.4 ms
Lead Channel Setting Sensing Sensitivity: 1.2 mV
Zone Setting Status: 755011
Zone Setting Status: 755011

## 2023-03-06 DIAGNOSIS — L03114 Cellulitis of left upper limb: Secondary | ICD-10-CM | POA: Diagnosis not present

## 2023-03-10 DIAGNOSIS — M79645 Pain in left finger(s): Secondary | ICD-10-CM | POA: Diagnosis not present

## 2023-03-10 DIAGNOSIS — L03114 Cellulitis of left upper limb: Secondary | ICD-10-CM | POA: Diagnosis not present

## 2023-03-12 DIAGNOSIS — L03114 Cellulitis of left upper limb: Secondary | ICD-10-CM | POA: Diagnosis not present

## 2023-03-24 DIAGNOSIS — L03114 Cellulitis of left upper limb: Secondary | ICD-10-CM | POA: Diagnosis not present

## 2023-03-25 DIAGNOSIS — B351 Tinea unguium: Secondary | ICD-10-CM | POA: Diagnosis not present

## 2023-03-25 DIAGNOSIS — D2372 Other benign neoplasm of skin of left lower limb, including hip: Secondary | ICD-10-CM | POA: Diagnosis not present

## 2023-03-25 DIAGNOSIS — L84 Corns and callosities: Secondary | ICD-10-CM | POA: Diagnosis not present

## 2023-03-25 DIAGNOSIS — M2141 Flat foot [pes planus] (acquired), right foot: Secondary | ICD-10-CM | POA: Diagnosis not present

## 2023-03-25 DIAGNOSIS — D2371 Other benign neoplasm of skin of right lower limb, including hip: Secondary | ICD-10-CM | POA: Diagnosis not present

## 2023-03-25 DIAGNOSIS — M79675 Pain in left toe(s): Secondary | ICD-10-CM | POA: Diagnosis not present

## 2023-03-29 NOTE — Progress Notes (Signed)
Remote pacemaker transmission.   

## 2023-05-05 DIAGNOSIS — I6501 Occlusion and stenosis of right vertebral artery: Secondary | ICD-10-CM | POA: Diagnosis not present

## 2023-05-05 DIAGNOSIS — I6782 Cerebral ischemia: Secondary | ICD-10-CM | POA: Diagnosis not present

## 2023-05-05 DIAGNOSIS — E785 Hyperlipidemia, unspecified: Secondary | ICD-10-CM | POA: Diagnosis not present

## 2023-05-05 DIAGNOSIS — I452 Bifascicular block: Secondary | ICD-10-CM | POA: Diagnosis not present

## 2023-05-05 DIAGNOSIS — I10 Essential (primary) hypertension: Secondary | ICD-10-CM | POA: Diagnosis not present

## 2023-05-05 DIAGNOSIS — I639 Cerebral infarction, unspecified: Secondary | ICD-10-CM | POA: Diagnosis not present

## 2023-05-05 DIAGNOSIS — R29723 NIHSS score 23: Secondary | ICD-10-CM | POA: Diagnosis not present

## 2023-05-05 DIAGNOSIS — G319 Degenerative disease of nervous system, unspecified: Secondary | ICD-10-CM | POA: Diagnosis not present

## 2023-05-05 DIAGNOSIS — I442 Atrioventricular block, complete: Secondary | ICD-10-CM | POA: Diagnosis not present

## 2023-05-05 DIAGNOSIS — E663 Overweight: Secondary | ICD-10-CM | POA: Diagnosis not present

## 2023-05-05 DIAGNOSIS — R32 Unspecified urinary incontinence: Secondary | ICD-10-CM | POA: Diagnosis not present

## 2023-05-05 DIAGNOSIS — I6523 Occlusion and stenosis of bilateral carotid arteries: Secondary | ICD-10-CM | POA: Diagnosis not present

## 2023-05-05 DIAGNOSIS — G459 Transient cerebral ischemic attack, unspecified: Secondary | ICD-10-CM | POA: Diagnosis not present

## 2023-05-05 DIAGNOSIS — I2510 Atherosclerotic heart disease of native coronary artery without angina pectoris: Secondary | ICD-10-CM | POA: Diagnosis not present

## 2023-05-06 ENCOUNTER — Telehealth: Payer: Self-pay | Admitting: Cardiology

## 2023-05-06 DIAGNOSIS — Z7982 Long term (current) use of aspirin: Secondary | ICD-10-CM | POA: Diagnosis not present

## 2023-05-06 DIAGNOSIS — I340 Nonrheumatic mitral (valve) insufficiency: Secondary | ICD-10-CM | POA: Diagnosis not present

## 2023-05-06 DIAGNOSIS — I6501 Occlusion and stenosis of right vertebral artery: Secondary | ICD-10-CM | POA: Diagnosis not present

## 2023-05-06 DIAGNOSIS — E663 Overweight: Secondary | ICD-10-CM | POA: Diagnosis not present

## 2023-05-06 DIAGNOSIS — Z87891 Personal history of nicotine dependence: Secondary | ICD-10-CM | POA: Diagnosis not present

## 2023-05-06 DIAGNOSIS — Z955 Presence of coronary angioplasty implant and graft: Secondary | ICD-10-CM | POA: Diagnosis not present

## 2023-05-06 DIAGNOSIS — I3481 Nonrheumatic mitral (valve) annulus calcification: Secondary | ICD-10-CM | POA: Diagnosis not present

## 2023-05-06 DIAGNOSIS — I10 Essential (primary) hypertension: Secondary | ICD-10-CM | POA: Diagnosis not present

## 2023-05-06 DIAGNOSIS — I2510 Atherosclerotic heart disease of native coronary artery without angina pectoris: Secondary | ICD-10-CM | POA: Diagnosis not present

## 2023-05-06 DIAGNOSIS — G459 Transient cerebral ischemic attack, unspecified: Secondary | ICD-10-CM | POA: Diagnosis not present

## 2023-05-06 DIAGNOSIS — Z950 Presence of cardiac pacemaker: Secondary | ICD-10-CM | POA: Diagnosis not present

## 2023-05-06 DIAGNOSIS — I452 Bifascicular block: Secondary | ICD-10-CM | POA: Diagnosis not present

## 2023-05-06 DIAGNOSIS — I3489 Other nonrheumatic mitral valve disorders: Secondary | ICD-10-CM | POA: Diagnosis not present

## 2023-05-06 DIAGNOSIS — I4510 Unspecified right bundle-branch block: Secondary | ICD-10-CM | POA: Diagnosis not present

## 2023-05-06 DIAGNOSIS — I6523 Occlusion and stenosis of bilateral carotid arteries: Secondary | ICD-10-CM | POA: Diagnosis not present

## 2023-05-06 DIAGNOSIS — Z6825 Body mass index (BMI) 25.0-25.9, adult: Secondary | ICD-10-CM | POA: Diagnosis not present

## 2023-05-06 DIAGNOSIS — I442 Atrioventricular block, complete: Secondary | ICD-10-CM | POA: Diagnosis not present

## 2023-05-06 DIAGNOSIS — E785 Hyperlipidemia, unspecified: Secondary | ICD-10-CM | POA: Diagnosis not present

## 2023-05-06 DIAGNOSIS — I517 Cardiomegaly: Secondary | ICD-10-CM | POA: Diagnosis not present

## 2023-05-06 DIAGNOSIS — Z8639 Personal history of other endocrine, nutritional and metabolic disease: Secondary | ICD-10-CM | POA: Diagnosis not present

## 2023-05-06 DIAGNOSIS — I6522 Occlusion and stenosis of left carotid artery: Secondary | ICD-10-CM | POA: Diagnosis not present

## 2023-05-06 DIAGNOSIS — Z8679 Personal history of other diseases of the circulatory system: Secondary | ICD-10-CM | POA: Diagnosis not present

## 2023-05-06 DIAGNOSIS — I639 Cerebral infarction, unspecified: Secondary | ICD-10-CM | POA: Diagnosis not present

## 2023-05-06 NOTE — Telephone Encounter (Signed)
Deena from [facility] in [place] called in stating pt is there now for an MRI. She knows he is MRI compatible but she needs something signed off by Dr. Lalla Brothers saying that its okay for her to adjust his pacemaker for him to have the MRI. She stated she will be faxing a document over in a few minutes to [phone] ATN Carly, RN. Just wanted to inform you all of the fax coming through.

## 2023-05-06 NOTE — Telephone Encounter (Signed)
Forms faxed for MRI Clearance.

## 2023-05-06 NOTE — Telephone Encounter (Signed)
Forms were given to device clinic

## 2023-05-07 DIAGNOSIS — Z8679 Personal history of other diseases of the circulatory system: Secondary | ICD-10-CM | POA: Diagnosis not present

## 2023-05-07 DIAGNOSIS — Z955 Presence of coronary angioplasty implant and graft: Secondary | ICD-10-CM | POA: Diagnosis not present

## 2023-05-07 DIAGNOSIS — I6501 Occlusion and stenosis of right vertebral artery: Secondary | ICD-10-CM | POA: Diagnosis not present

## 2023-05-07 DIAGNOSIS — Z87891 Personal history of nicotine dependence: Secondary | ICD-10-CM | POA: Diagnosis not present

## 2023-05-07 DIAGNOSIS — I6789 Other cerebrovascular disease: Secondary | ICD-10-CM | POA: Diagnosis not present

## 2023-05-07 DIAGNOSIS — E785 Hyperlipidemia, unspecified: Secondary | ICD-10-CM | POA: Diagnosis not present

## 2023-05-07 DIAGNOSIS — E663 Overweight: Secondary | ICD-10-CM | POA: Diagnosis not present

## 2023-05-07 DIAGNOSIS — G459 Transient cerebral ischemic attack, unspecified: Secondary | ICD-10-CM | POA: Diagnosis not present

## 2023-05-07 DIAGNOSIS — I442 Atrioventricular block, complete: Secondary | ICD-10-CM | POA: Diagnosis not present

## 2023-05-07 DIAGNOSIS — I6522 Occlusion and stenosis of left carotid artery: Secondary | ICD-10-CM | POA: Diagnosis not present

## 2023-05-07 DIAGNOSIS — I10 Essential (primary) hypertension: Secondary | ICD-10-CM | POA: Diagnosis not present

## 2023-05-07 DIAGNOSIS — Z7982 Long term (current) use of aspirin: Secondary | ICD-10-CM | POA: Diagnosis not present

## 2023-05-07 DIAGNOSIS — Z6825 Body mass index (BMI) 25.0-25.9, adult: Secondary | ICD-10-CM | POA: Diagnosis not present

## 2023-05-07 DIAGNOSIS — Z8639 Personal history of other endocrine, nutritional and metabolic disease: Secondary | ICD-10-CM | POA: Diagnosis not present

## 2023-05-07 DIAGNOSIS — Z950 Presence of cardiac pacemaker: Secondary | ICD-10-CM | POA: Diagnosis not present

## 2023-05-07 DIAGNOSIS — I6523 Occlusion and stenosis of bilateral carotid arteries: Secondary | ICD-10-CM | POA: Diagnosis not present

## 2023-05-07 DIAGNOSIS — I2510 Atherosclerotic heart disease of native coronary artery without angina pectoris: Secondary | ICD-10-CM | POA: Diagnosis not present

## 2023-05-09 DIAGNOSIS — G8324 Monoplegia of upper limb affecting left nondominant side: Secondary | ICD-10-CM | POA: Diagnosis not present

## 2023-05-09 DIAGNOSIS — I10 Essential (primary) hypertension: Secondary | ICD-10-CM | POA: Diagnosis not present

## 2023-05-09 DIAGNOSIS — Z950 Presence of cardiac pacemaker: Secondary | ICD-10-CM | POA: Diagnosis not present

## 2023-05-09 DIAGNOSIS — E785 Hyperlipidemia, unspecified: Secondary | ICD-10-CM | POA: Diagnosis not present

## 2023-05-09 DIAGNOSIS — R531 Weakness: Secondary | ICD-10-CM | POA: Diagnosis not present

## 2023-05-09 DIAGNOSIS — Z7982 Long term (current) use of aspirin: Secondary | ICD-10-CM | POA: Diagnosis not present

## 2023-05-09 DIAGNOSIS — I2510 Atherosclerotic heart disease of native coronary artery without angina pectoris: Secondary | ICD-10-CM | POA: Diagnosis not present

## 2023-05-09 DIAGNOSIS — I6523 Occlusion and stenosis of bilateral carotid arteries: Secondary | ICD-10-CM | POA: Diagnosis not present

## 2023-05-09 DIAGNOSIS — I442 Atrioventricular block, complete: Secondary | ICD-10-CM | POA: Diagnosis not present

## 2023-05-10 ENCOUNTER — Encounter: Payer: Self-pay | Admitting: *Deleted

## 2023-05-10 ENCOUNTER — Telehealth: Payer: Self-pay | Admitting: *Deleted

## 2023-05-10 DIAGNOSIS — Z7982 Long term (current) use of aspirin: Secondary | ICD-10-CM | POA: Diagnosis not present

## 2023-05-10 DIAGNOSIS — G8324 Monoplegia of upper limb affecting left nondominant side: Secondary | ICD-10-CM | POA: Diagnosis not present

## 2023-05-10 DIAGNOSIS — I2510 Atherosclerotic heart disease of native coronary artery without angina pectoris: Secondary | ICD-10-CM | POA: Diagnosis not present

## 2023-05-10 DIAGNOSIS — I442 Atrioventricular block, complete: Secondary | ICD-10-CM | POA: Diagnosis not present

## 2023-05-10 DIAGNOSIS — E785 Hyperlipidemia, unspecified: Secondary | ICD-10-CM | POA: Diagnosis not present

## 2023-05-10 DIAGNOSIS — Z950 Presence of cardiac pacemaker: Secondary | ICD-10-CM | POA: Diagnosis not present

## 2023-05-10 DIAGNOSIS — I10 Essential (primary) hypertension: Secondary | ICD-10-CM | POA: Diagnosis not present

## 2023-05-10 DIAGNOSIS — I6523 Occlusion and stenosis of bilateral carotid arteries: Secondary | ICD-10-CM | POA: Diagnosis not present

## 2023-05-10 DIAGNOSIS — R531 Weakness: Secondary | ICD-10-CM | POA: Diagnosis not present

## 2023-05-10 NOTE — Transitions of Care (Post Inpatient/ED Visit) (Signed)
   05/10/2023  Name: Joshua [name] MRN: [id number] DOB: 1935/12/20  Today's TOC FU Call Status: Today's TOC FU Call Status:: Unsuccessul Call (1st Attempt) Unsuccessful Call (1st Attempt) Date: 05/10/23  Attempted to reach the patient regarding the most recent Inpatient visit; left HIPAA compliant voice message requesting call back  Follow Up Plan: Additional outreach attempts will be made to reach the patient to complete the Transitions of Care (Post Inpatient visit) call.   Caryl Pina, RN, BSN, CCRN Alumnus RN CM Care Coordination/ Transition of Care- [facility] Care Management [phone]: direct office

## 2023-05-11 ENCOUNTER — Encounter: Payer: Self-pay | Admitting: *Deleted

## 2023-05-11 ENCOUNTER — Telehealth: Payer: Self-pay | Admitting: *Deleted

## 2023-05-11 DIAGNOSIS — I2510 Atherosclerotic heart disease of native coronary artery without angina pectoris: Secondary | ICD-10-CM | POA: Diagnosis not present

## 2023-05-11 DIAGNOSIS — I6523 Occlusion and stenosis of bilateral carotid arteries: Secondary | ICD-10-CM | POA: Diagnosis not present

## 2023-05-11 DIAGNOSIS — R531 Weakness: Secondary | ICD-10-CM | POA: Diagnosis not present

## 2023-05-11 DIAGNOSIS — I10 Essential (primary) hypertension: Secondary | ICD-10-CM | POA: Diagnosis not present

## 2023-05-11 DIAGNOSIS — I442 Atrioventricular block, complete: Secondary | ICD-10-CM | POA: Diagnosis not present

## 2023-05-11 DIAGNOSIS — G8324 Monoplegia of upper limb affecting left nondominant side: Secondary | ICD-10-CM | POA: Diagnosis not present

## 2023-05-11 DIAGNOSIS — Z7982 Long term (current) use of aspirin: Secondary | ICD-10-CM | POA: Diagnosis not present

## 2023-05-11 DIAGNOSIS — E785 Hyperlipidemia, unspecified: Secondary | ICD-10-CM | POA: Diagnosis not present

## 2023-05-11 DIAGNOSIS — Z950 Presence of cardiac pacemaker: Secondary | ICD-10-CM | POA: Diagnosis not present

## 2023-05-11 NOTE — Transitions of Care (Post Inpatient/ED Visit) (Signed)
   05/11/2023  Name: Joshua [name] MRN: [id number] DOB: 1936/05/26  Today's TOC FU Call Status: Today's TOC FU Call Status:: Unsuccessful Call (2nd Attempt) Unsuccessful Call (2nd Attempt) Date: 05/11/23  Attempted to reach the patient regarding the most recent Inpatient visit; left HIPAA compliant voice message requesting call back  Follow Up Plan: Additional outreach attempts will be made to reach the patient to complete the Transitions of Care (Post Inpatient visit) call.   Caryl Pina, RN, BSN, CCRN Alumnus RN CM Care Coordination/ Transition of Care- [facility] Care Management [phone]: direct office

## 2023-05-12 ENCOUNTER — Encounter: Payer: Self-pay | Admitting: *Deleted

## 2023-05-12 ENCOUNTER — Telehealth: Payer: Self-pay | Admitting: *Deleted

## 2023-05-12 DIAGNOSIS — I2510 Atherosclerotic heart disease of native coronary artery without angina pectoris: Secondary | ICD-10-CM | POA: Diagnosis not present

## 2023-05-12 DIAGNOSIS — G8324 Monoplegia of upper limb affecting left nondominant side: Secondary | ICD-10-CM | POA: Diagnosis not present

## 2023-05-12 DIAGNOSIS — Z7982 Long term (current) use of aspirin: Secondary | ICD-10-CM | POA: Diagnosis not present

## 2023-05-12 DIAGNOSIS — E785 Hyperlipidemia, unspecified: Secondary | ICD-10-CM | POA: Diagnosis not present

## 2023-05-12 DIAGNOSIS — Z950 Presence of cardiac pacemaker: Secondary | ICD-10-CM | POA: Diagnosis not present

## 2023-05-12 DIAGNOSIS — I6523 Occlusion and stenosis of bilateral carotid arteries: Secondary | ICD-10-CM | POA: Diagnosis not present

## 2023-05-12 DIAGNOSIS — I10 Essential (primary) hypertension: Secondary | ICD-10-CM | POA: Diagnosis not present

## 2023-05-12 DIAGNOSIS — R531 Weakness: Secondary | ICD-10-CM | POA: Diagnosis not present

## 2023-05-12 DIAGNOSIS — I442 Atrioventricular block, complete: Secondary | ICD-10-CM | POA: Diagnosis not present

## 2023-05-12 NOTE — Transitions of Care (Post Inpatient/ED Visit) (Signed)
   05/12/2023  Name: Joshua [name] MRN: [id number] DOB: 09-07-1936  Today's TOC FU Call Status: Today's TOC FU Call Status:: Unsuccessful Call (3rd Attempt) Unsuccessful Call (3rd Attempt) Date: 05/12/23  Attempted to reach the patient regarding the most recent Inpatient visit; left HIPAA compliant voice message requesting call back  Follow Up Plan: No further outreach attempts will be made at this time. We have been unable to contact the patient.  Caryl Pina, RN, BSN, CCRN Alumnus RN CM Care Coordination/ Transition of Care- [facility] Care Management [phone]: direct office

## 2023-05-13 DIAGNOSIS — I10 Essential (primary) hypertension: Secondary | ICD-10-CM | POA: Diagnosis not present

## 2023-05-13 DIAGNOSIS — I442 Atrioventricular block, complete: Secondary | ICD-10-CM | POA: Diagnosis not present

## 2023-05-13 DIAGNOSIS — Z7982 Long term (current) use of aspirin: Secondary | ICD-10-CM | POA: Diagnosis not present

## 2023-05-13 DIAGNOSIS — R531 Weakness: Secondary | ICD-10-CM | POA: Diagnosis not present

## 2023-05-13 DIAGNOSIS — I2510 Atherosclerotic heart disease of native coronary artery without angina pectoris: Secondary | ICD-10-CM | POA: Diagnosis not present

## 2023-05-13 DIAGNOSIS — E785 Hyperlipidemia, unspecified: Secondary | ICD-10-CM | POA: Diagnosis not present

## 2023-05-13 DIAGNOSIS — I6523 Occlusion and stenosis of bilateral carotid arteries: Secondary | ICD-10-CM | POA: Diagnosis not present

## 2023-05-13 DIAGNOSIS — Z950 Presence of cardiac pacemaker: Secondary | ICD-10-CM | POA: Diagnosis not present

## 2023-05-13 DIAGNOSIS — G8324 Monoplegia of upper limb affecting left nondominant side: Secondary | ICD-10-CM | POA: Diagnosis not present

## 2023-05-18 DIAGNOSIS — I10 Essential (primary) hypertension: Secondary | ICD-10-CM | POA: Diagnosis not present

## 2023-05-18 DIAGNOSIS — I2510 Atherosclerotic heart disease of native coronary artery without angina pectoris: Secondary | ICD-10-CM | POA: Diagnosis not present

## 2023-05-18 DIAGNOSIS — I442 Atrioventricular block, complete: Secondary | ICD-10-CM | POA: Diagnosis not present

## 2023-05-18 DIAGNOSIS — G8324 Monoplegia of upper limb affecting left nondominant side: Secondary | ICD-10-CM | POA: Diagnosis not present

## 2023-05-18 DIAGNOSIS — Z950 Presence of cardiac pacemaker: Secondary | ICD-10-CM | POA: Diagnosis not present

## 2023-05-18 DIAGNOSIS — R531 Weakness: Secondary | ICD-10-CM | POA: Diagnosis not present

## 2023-05-18 DIAGNOSIS — Z7982 Long term (current) use of aspirin: Secondary | ICD-10-CM | POA: Diagnosis not present

## 2023-05-18 DIAGNOSIS — E785 Hyperlipidemia, unspecified: Secondary | ICD-10-CM | POA: Diagnosis not present

## 2023-05-18 DIAGNOSIS — I6523 Occlusion and stenosis of bilateral carotid arteries: Secondary | ICD-10-CM | POA: Diagnosis not present

## 2023-05-24 ENCOUNTER — Ambulatory Visit: Payer: Medicare HMO | Admitting: Family Medicine

## 2023-05-24 ENCOUNTER — Encounter: Payer: Self-pay | Admitting: Family Medicine

## 2023-05-24 VITALS — BP 122/70 | HR 77 | Temp 97.9°F | Ht 71.0 in | Wt 181.5 lb

## 2023-05-24 DIAGNOSIS — G459 Transient cerebral ischemic attack, unspecified: Secondary | ICD-10-CM | POA: Diagnosis not present

## 2023-05-24 MED ORDER — ATORVASTATIN CALCIUM 80 MG PO TABS: 80.0000 mg | ORAL_TABLET | Freq: Every day | ORAL | 3 refills | Status: DC

## 2023-05-24 NOTE — Patient Instructions (Signed)
If you do not hear anything about your referral in the next 1-2 weeks, call our office and ask for an update.  Stay on the aspirin.   We are increasing the Lipitor to 80 mg daily.   Let us know if you need anything.

## 2023-05-24 NOTE — Progress Notes (Signed)
Chief Complaint  Patient presents with   Follow-up    Needs referrals today Neurology and Vascular    Subjective: Patient is a 87 y.o. male here for hosp f/u. Here w son.   Pt admitted to OSH in [place] on 05/05/23-05/07/23. His son was having trouble difficulty rousing him. EMS called and he underwent stroke workup.  No lesions noted on imaging.  On CT, questionable stenosis of the carotids which was not confirmed dedicated carotid ultrasound.  He is currently taking Lipitor 20 mg daily, aspirin 81 mg daily.  He reports compliance and no adverse effects.  His mobility is at his baseline.  No acute difficulty with speech, lightheadedness, or repeat episodes of being difficult to arouse, trouble swallowing, or weakness.  Past Medical History:  Diagnosis Date   AKI (acute kidney injury) (HCC) 08/10/2022   Arthritis    Cataract    Esophageal stricture    GERD (gastroesophageal reflux disease)    Hyperlipidemia    Hypertension    Liver disease    hepatitis   Normocytic anemia 06/04/2022   Pacemaker    Rhabdomyolysis 06/04/2022   Severe aortic stenosis     Objective: BP 122/70 (BP Location: Right Arm, Patient Position: Sitting, Cuff Size: Normal)   Pulse 77   Temp 97.9 F (36.6 C) (Oral)   Ht 5\' 11"  (1.803 m)   Wt 181 lb 8 oz (82.3 kg)   SpO2 94%   BMI 25.31 kg/m  General: Awake, appears stated age Heart: RRR, no LE edema Lungs: CTAB, no rales, wheezes or rhonchi. No accessory muscle use 5/5 strength throughout, no cerebellar signs, gait is slow and cautious with a walker Psych: Age appropriate judgment and insight, normal affect and mood  Assessment and Plan: TIA (transient ischemic attack) - Plan: Ambulatory referral to Home Health, Ambulatory referral to Neurology  We will plan to increase his Lipitor from 20 mg daily to 80 mg daily.  Refer to home health and neurology.  We had a discussion about 70% carotid stenosis.  This was not confirmed on ultrasound and neither him nor his  son are convinced they would want surgery even if that was an option.  We will hold off on a vascular surgery referral at this time. The patient and his son voiced understanding and agreement to the plan.  Jilda Roche [place], DO 05/24/23  11:33 AM

## 2023-05-25 DIAGNOSIS — M62512 Muscle wasting and atrophy, not elsewhere classified, left shoulder: Secondary | ICD-10-CM | POA: Diagnosis not present

## 2023-05-25 DIAGNOSIS — R2681 Unsteadiness on feet: Secondary | ICD-10-CM | POA: Diagnosis not present

## 2023-05-25 DIAGNOSIS — R296 Repeated falls: Secondary | ICD-10-CM | POA: Diagnosis not present

## 2023-05-25 DIAGNOSIS — M62511 Muscle wasting and atrophy, not elsewhere classified, right shoulder: Secondary | ICD-10-CM | POA: Diagnosis not present

## 2023-05-25 DIAGNOSIS — M6259 Muscle wasting and atrophy, not elsewhere classified, multiple sites: Secondary | ICD-10-CM | POA: Diagnosis not present

## 2023-05-26 DIAGNOSIS — R2689 Other abnormalities of gait and mobility: Secondary | ICD-10-CM | POA: Diagnosis not present

## 2023-05-26 DIAGNOSIS — R2681 Unsteadiness on feet: Secondary | ICD-10-CM | POA: Diagnosis not present

## 2023-05-28 DIAGNOSIS — R2689 Other abnormalities of gait and mobility: Secondary | ICD-10-CM | POA: Diagnosis not present

## 2023-05-28 DIAGNOSIS — R2681 Unsteadiness on feet: Secondary | ICD-10-CM | POA: Diagnosis not present

## 2023-05-31 ENCOUNTER — Ambulatory Visit: Payer: Medicare HMO

## 2023-05-31 DIAGNOSIS — R296 Repeated falls: Secondary | ICD-10-CM | POA: Diagnosis not present

## 2023-05-31 DIAGNOSIS — R2689 Other abnormalities of gait and mobility: Secondary | ICD-10-CM | POA: Diagnosis not present

## 2023-05-31 DIAGNOSIS — M6259 Muscle wasting and atrophy, not elsewhere classified, multiple sites: Secondary | ICD-10-CM | POA: Diagnosis not present

## 2023-05-31 DIAGNOSIS — I442 Atrioventricular block, complete: Secondary | ICD-10-CM | POA: Diagnosis not present

## 2023-05-31 DIAGNOSIS — M62511 Muscle wasting and atrophy, not elsewhere classified, right shoulder: Secondary | ICD-10-CM | POA: Diagnosis not present

## 2023-05-31 DIAGNOSIS — R2681 Unsteadiness on feet: Secondary | ICD-10-CM | POA: Diagnosis not present

## 2023-05-31 DIAGNOSIS — M62512 Muscle wasting and atrophy, not elsewhere classified, left shoulder: Secondary | ICD-10-CM | POA: Diagnosis not present

## 2023-06-01 DIAGNOSIS — R2689 Other abnormalities of gait and mobility: Secondary | ICD-10-CM | POA: Diagnosis not present

## 2023-06-01 DIAGNOSIS — R2681 Unsteadiness on feet: Secondary | ICD-10-CM | POA: Diagnosis not present

## 2023-06-02 DIAGNOSIS — R296 Repeated falls: Secondary | ICD-10-CM | POA: Diagnosis not present

## 2023-06-02 DIAGNOSIS — R2681 Unsteadiness on feet: Secondary | ICD-10-CM | POA: Diagnosis not present

## 2023-06-02 DIAGNOSIS — M62511 Muscle wasting and atrophy, not elsewhere classified, right shoulder: Secondary | ICD-10-CM | POA: Diagnosis not present

## 2023-06-02 DIAGNOSIS — M6259 Muscle wasting and atrophy, not elsewhere classified, multiple sites: Secondary | ICD-10-CM | POA: Diagnosis not present

## 2023-06-02 DIAGNOSIS — M62512 Muscle wasting and atrophy, not elsewhere classified, left shoulder: Secondary | ICD-10-CM | POA: Diagnosis not present

## 2023-06-03 DIAGNOSIS — R2689 Other abnormalities of gait and mobility: Secondary | ICD-10-CM | POA: Diagnosis not present

## 2023-06-03 DIAGNOSIS — R2681 Unsteadiness on feet: Secondary | ICD-10-CM | POA: Diagnosis not present

## 2023-06-04 DIAGNOSIS — R2681 Unsteadiness on feet: Secondary | ICD-10-CM | POA: Diagnosis not present

## 2023-06-04 DIAGNOSIS — M62512 Muscle wasting and atrophy, not elsewhere classified, left shoulder: Secondary | ICD-10-CM | POA: Diagnosis not present

## 2023-06-04 DIAGNOSIS — M62511 Muscle wasting and atrophy, not elsewhere classified, right shoulder: Secondary | ICD-10-CM | POA: Diagnosis not present

## 2023-06-04 DIAGNOSIS — R296 Repeated falls: Secondary | ICD-10-CM | POA: Diagnosis not present

## 2023-06-04 DIAGNOSIS — M6259 Muscle wasting and atrophy, not elsewhere classified, multiple sites: Secondary | ICD-10-CM | POA: Diagnosis not present

## 2023-06-07 DIAGNOSIS — R2681 Unsteadiness on feet: Secondary | ICD-10-CM | POA: Diagnosis not present

## 2023-06-07 DIAGNOSIS — R2689 Other abnormalities of gait and mobility: Secondary | ICD-10-CM | POA: Diagnosis not present

## 2023-06-08 DIAGNOSIS — M62511 Muscle wasting and atrophy, not elsewhere classified, right shoulder: Secondary | ICD-10-CM | POA: Diagnosis not present

## 2023-06-08 DIAGNOSIS — M6259 Muscle wasting and atrophy, not elsewhere classified, multiple sites: Secondary | ICD-10-CM | POA: Diagnosis not present

## 2023-06-08 DIAGNOSIS — R296 Repeated falls: Secondary | ICD-10-CM | POA: Diagnosis not present

## 2023-06-08 DIAGNOSIS — R2681 Unsteadiness on feet: Secondary | ICD-10-CM | POA: Diagnosis not present

## 2023-06-08 DIAGNOSIS — M62512 Muscle wasting and atrophy, not elsewhere classified, left shoulder: Secondary | ICD-10-CM | POA: Diagnosis not present

## 2023-06-09 DIAGNOSIS — M6259 Muscle wasting and atrophy, not elsewhere classified, multiple sites: Secondary | ICD-10-CM | POA: Diagnosis not present

## 2023-06-09 DIAGNOSIS — R2681 Unsteadiness on feet: Secondary | ICD-10-CM | POA: Diagnosis not present

## 2023-06-09 DIAGNOSIS — M62512 Muscle wasting and atrophy, not elsewhere classified, left shoulder: Secondary | ICD-10-CM | POA: Diagnosis not present

## 2023-06-09 DIAGNOSIS — R296 Repeated falls: Secondary | ICD-10-CM | POA: Diagnosis not present

## 2023-06-09 DIAGNOSIS — M62511 Muscle wasting and atrophy, not elsewhere classified, right shoulder: Secondary | ICD-10-CM | POA: Diagnosis not present

## 2023-06-09 DIAGNOSIS — R2689 Other abnormalities of gait and mobility: Secondary | ICD-10-CM | POA: Diagnosis not present

## 2023-06-11 DIAGNOSIS — M62512 Muscle wasting and atrophy, not elsewhere classified, left shoulder: Secondary | ICD-10-CM | POA: Diagnosis not present

## 2023-06-11 DIAGNOSIS — R296 Repeated falls: Secondary | ICD-10-CM | POA: Diagnosis not present

## 2023-06-11 DIAGNOSIS — M62511 Muscle wasting and atrophy, not elsewhere classified, right shoulder: Secondary | ICD-10-CM | POA: Diagnosis not present

## 2023-06-11 DIAGNOSIS — R2681 Unsteadiness on feet: Secondary | ICD-10-CM | POA: Diagnosis not present

## 2023-06-11 DIAGNOSIS — M6259 Muscle wasting and atrophy, not elsewhere classified, multiple sites: Secondary | ICD-10-CM | POA: Diagnosis not present

## 2023-06-14 DIAGNOSIS — R2681 Unsteadiness on feet: Secondary | ICD-10-CM | POA: Diagnosis not present

## 2023-06-14 DIAGNOSIS — M62511 Muscle wasting and atrophy, not elsewhere classified, right shoulder: Secondary | ICD-10-CM | POA: Diagnosis not present

## 2023-06-14 DIAGNOSIS — R2689 Other abnormalities of gait and mobility: Secondary | ICD-10-CM | POA: Diagnosis not present

## 2023-06-14 DIAGNOSIS — R296 Repeated falls: Secondary | ICD-10-CM | POA: Diagnosis not present

## 2023-06-14 DIAGNOSIS — M62512 Muscle wasting and atrophy, not elsewhere classified, left shoulder: Secondary | ICD-10-CM | POA: Diagnosis not present

## 2023-06-14 DIAGNOSIS — M6259 Muscle wasting and atrophy, not elsewhere classified, multiple sites: Secondary | ICD-10-CM | POA: Diagnosis not present

## 2023-06-15 DIAGNOSIS — R296 Repeated falls: Secondary | ICD-10-CM | POA: Diagnosis not present

## 2023-06-15 DIAGNOSIS — M6259 Muscle wasting and atrophy, not elsewhere classified, multiple sites: Secondary | ICD-10-CM | POA: Diagnosis not present

## 2023-06-15 DIAGNOSIS — M62511 Muscle wasting and atrophy, not elsewhere classified, right shoulder: Secondary | ICD-10-CM | POA: Diagnosis not present

## 2023-06-15 DIAGNOSIS — M62512 Muscle wasting and atrophy, not elsewhere classified, left shoulder: Secondary | ICD-10-CM | POA: Diagnosis not present

## 2023-06-15 DIAGNOSIS — R2681 Unsteadiness on feet: Secondary | ICD-10-CM | POA: Diagnosis not present

## 2023-06-15 NOTE — Progress Notes (Signed)
Remote pacemaker transmission.   

## 2023-06-16 DIAGNOSIS — R2689 Other abnormalities of gait and mobility: Secondary | ICD-10-CM | POA: Diagnosis not present

## 2023-06-16 DIAGNOSIS — M62512 Muscle wasting and atrophy, not elsewhere classified, left shoulder: Secondary | ICD-10-CM | POA: Diagnosis not present

## 2023-06-16 DIAGNOSIS — R2681 Unsteadiness on feet: Secondary | ICD-10-CM | POA: Diagnosis not present

## 2023-06-16 DIAGNOSIS — R296 Repeated falls: Secondary | ICD-10-CM | POA: Diagnosis not present

## 2023-06-16 DIAGNOSIS — M6259 Muscle wasting and atrophy, not elsewhere classified, multiple sites: Secondary | ICD-10-CM | POA: Diagnosis not present

## 2023-06-16 DIAGNOSIS — M62511 Muscle wasting and atrophy, not elsewhere classified, right shoulder: Secondary | ICD-10-CM | POA: Diagnosis not present

## 2023-06-23 DIAGNOSIS — R2681 Unsteadiness on feet: Secondary | ICD-10-CM | POA: Diagnosis not present

## 2023-06-23 DIAGNOSIS — M62511 Muscle wasting and atrophy, not elsewhere classified, right shoulder: Secondary | ICD-10-CM | POA: Diagnosis not present

## 2023-06-23 DIAGNOSIS — R296 Repeated falls: Secondary | ICD-10-CM | POA: Diagnosis not present

## 2023-06-23 DIAGNOSIS — R2689 Other abnormalities of gait and mobility: Secondary | ICD-10-CM | POA: Diagnosis not present

## 2023-06-23 DIAGNOSIS — M62512 Muscle wasting and atrophy, not elsewhere classified, left shoulder: Secondary | ICD-10-CM | POA: Diagnosis not present

## 2023-06-23 DIAGNOSIS — M6259 Muscle wasting and atrophy, not elsewhere classified, multiple sites: Secondary | ICD-10-CM | POA: Diagnosis not present

## 2023-06-24 DIAGNOSIS — M6259 Muscle wasting and atrophy, not elsewhere classified, multiple sites: Secondary | ICD-10-CM | POA: Diagnosis not present

## 2023-06-24 DIAGNOSIS — R2681 Unsteadiness on feet: Secondary | ICD-10-CM | POA: Diagnosis not present

## 2023-06-24 DIAGNOSIS — R296 Repeated falls: Secondary | ICD-10-CM | POA: Diagnosis not present

## 2023-06-24 DIAGNOSIS — M62512 Muscle wasting and atrophy, not elsewhere classified, left shoulder: Secondary | ICD-10-CM | POA: Diagnosis not present

## 2023-06-24 DIAGNOSIS — M62511 Muscle wasting and atrophy, not elsewhere classified, right shoulder: Secondary | ICD-10-CM | POA: Diagnosis not present

## 2023-06-25 DIAGNOSIS — R2681 Unsteadiness on feet: Secondary | ICD-10-CM | POA: Diagnosis not present

## 2023-06-25 DIAGNOSIS — R2689 Other abnormalities of gait and mobility: Secondary | ICD-10-CM | POA: Diagnosis not present

## 2023-06-28 DIAGNOSIS — R2681 Unsteadiness on feet: Secondary | ICD-10-CM | POA: Diagnosis not present

## 2023-06-28 DIAGNOSIS — M62511 Muscle wasting and atrophy, not elsewhere classified, right shoulder: Secondary | ICD-10-CM | POA: Diagnosis not present

## 2023-06-28 DIAGNOSIS — R296 Repeated falls: Secondary | ICD-10-CM | POA: Diagnosis not present

## 2023-06-28 DIAGNOSIS — M62512 Muscle wasting and atrophy, not elsewhere classified, left shoulder: Secondary | ICD-10-CM | POA: Diagnosis not present

## 2023-06-28 DIAGNOSIS — M6259 Muscle wasting and atrophy, not elsewhere classified, multiple sites: Secondary | ICD-10-CM | POA: Diagnosis not present

## 2023-06-29 DIAGNOSIS — M62511 Muscle wasting and atrophy, not elsewhere classified, right shoulder: Secondary | ICD-10-CM | POA: Diagnosis not present

## 2023-06-29 DIAGNOSIS — M6259 Muscle wasting and atrophy, not elsewhere classified, multiple sites: Secondary | ICD-10-CM | POA: Diagnosis not present

## 2023-06-29 DIAGNOSIS — R2681 Unsteadiness on feet: Secondary | ICD-10-CM | POA: Diagnosis not present

## 2023-06-29 DIAGNOSIS — M62512 Muscle wasting and atrophy, not elsewhere classified, left shoulder: Secondary | ICD-10-CM | POA: Diagnosis not present

## 2023-06-29 DIAGNOSIS — R296 Repeated falls: Secondary | ICD-10-CM | POA: Diagnosis not present

## 2023-06-30 ENCOUNTER — Ambulatory Visit: Payer: Medicare HMO | Admitting: Cardiovascular Disease

## 2023-06-30 DIAGNOSIS — R2689 Other abnormalities of gait and mobility: Secondary | ICD-10-CM | POA: Diagnosis not present

## 2023-06-30 DIAGNOSIS — R2681 Unsteadiness on feet: Secondary | ICD-10-CM | POA: Diagnosis not present

## 2023-07-01 DIAGNOSIS — R2681 Unsteadiness on feet: Secondary | ICD-10-CM | POA: Diagnosis not present

## 2023-07-01 DIAGNOSIS — R2689 Other abnormalities of gait and mobility: Secondary | ICD-10-CM | POA: Diagnosis not present

## 2023-07-02 DIAGNOSIS — R296 Repeated falls: Secondary | ICD-10-CM | POA: Diagnosis not present

## 2023-07-02 DIAGNOSIS — M62511 Muscle wasting and atrophy, not elsewhere classified, right shoulder: Secondary | ICD-10-CM | POA: Diagnosis not present

## 2023-07-02 DIAGNOSIS — M62512 Muscle wasting and atrophy, not elsewhere classified, left shoulder: Secondary | ICD-10-CM | POA: Diagnosis not present

## 2023-07-02 DIAGNOSIS — R2681 Unsteadiness on feet: Secondary | ICD-10-CM | POA: Diagnosis not present

## 2023-07-02 DIAGNOSIS — M6259 Muscle wasting and atrophy, not elsewhere classified, multiple sites: Secondary | ICD-10-CM | POA: Diagnosis not present

## 2023-07-05 DIAGNOSIS — M62511 Muscle wasting and atrophy, not elsewhere classified, right shoulder: Secondary | ICD-10-CM | POA: Diagnosis not present

## 2023-07-05 DIAGNOSIS — M6259 Muscle wasting and atrophy, not elsewhere classified, multiple sites: Secondary | ICD-10-CM | POA: Diagnosis not present

## 2023-07-05 DIAGNOSIS — R2689 Other abnormalities of gait and mobility: Secondary | ICD-10-CM | POA: Diagnosis not present

## 2023-07-05 DIAGNOSIS — R2681 Unsteadiness on feet: Secondary | ICD-10-CM | POA: Diagnosis not present

## 2023-07-05 DIAGNOSIS — M62512 Muscle wasting and atrophy, not elsewhere classified, left shoulder: Secondary | ICD-10-CM | POA: Diagnosis not present

## 2023-07-05 DIAGNOSIS — R296 Repeated falls: Secondary | ICD-10-CM | POA: Diagnosis not present

## 2023-07-06 ENCOUNTER — Telehealth: Payer: Self-pay | Admitting: Family Medicine

## 2023-07-06 NOTE — Telephone Encounter (Signed)
PCP received from [place] [facility] Living Physician report. Patient last seen 05/24/2023 Called the son Cadence Mclamb (on [place]) to update if any changes since last appointment... son informed no changes. PCP will complete and then will fax back to [place] at [phone]

## 2023-07-08 DIAGNOSIS — M6259 Muscle wasting and atrophy, not elsewhere classified, multiple sites: Secondary | ICD-10-CM | POA: Diagnosis not present

## 2023-07-08 DIAGNOSIS — M62511 Muscle wasting and atrophy, not elsewhere classified, right shoulder: Secondary | ICD-10-CM | POA: Diagnosis not present

## 2023-07-08 DIAGNOSIS — M62512 Muscle wasting and atrophy, not elsewhere classified, left shoulder: Secondary | ICD-10-CM | POA: Diagnosis not present

## 2023-07-08 DIAGNOSIS — R2681 Unsteadiness on feet: Secondary | ICD-10-CM | POA: Diagnosis not present

## 2023-07-08 DIAGNOSIS — R296 Repeated falls: Secondary | ICD-10-CM | POA: Diagnosis not present

## 2023-07-09 DIAGNOSIS — R2689 Other abnormalities of gait and mobility: Secondary | ICD-10-CM | POA: Diagnosis not present

## 2023-07-09 DIAGNOSIS — R2681 Unsteadiness on feet: Secondary | ICD-10-CM | POA: Diagnosis not present

## 2023-07-12 DIAGNOSIS — R296 Repeated falls: Secondary | ICD-10-CM | POA: Diagnosis not present

## 2023-07-12 DIAGNOSIS — M6259 Muscle wasting and atrophy, not elsewhere classified, multiple sites: Secondary | ICD-10-CM | POA: Diagnosis not present

## 2023-07-12 DIAGNOSIS — R2681 Unsteadiness on feet: Secondary | ICD-10-CM | POA: Diagnosis not present

## 2023-07-12 DIAGNOSIS — M62512 Muscle wasting and atrophy, not elsewhere classified, left shoulder: Secondary | ICD-10-CM | POA: Diagnosis not present

## 2023-07-12 DIAGNOSIS — M62511 Muscle wasting and atrophy, not elsewhere classified, right shoulder: Secondary | ICD-10-CM | POA: Diagnosis not present

## 2023-07-13 DIAGNOSIS — R2681 Unsteadiness on feet: Secondary | ICD-10-CM | POA: Diagnosis not present

## 2023-07-13 DIAGNOSIS — R2689 Other abnormalities of gait and mobility: Secondary | ICD-10-CM | POA: Diagnosis not present

## 2023-07-14 DIAGNOSIS — R2689 Other abnormalities of gait and mobility: Secondary | ICD-10-CM | POA: Diagnosis not present

## 2023-07-14 DIAGNOSIS — M62512 Muscle wasting and atrophy, not elsewhere classified, left shoulder: Secondary | ICD-10-CM | POA: Diagnosis not present

## 2023-07-14 DIAGNOSIS — M6259 Muscle wasting and atrophy, not elsewhere classified, multiple sites: Secondary | ICD-10-CM | POA: Diagnosis not present

## 2023-07-14 DIAGNOSIS — R296 Repeated falls: Secondary | ICD-10-CM | POA: Diagnosis not present

## 2023-07-14 DIAGNOSIS — R2681 Unsteadiness on feet: Secondary | ICD-10-CM | POA: Diagnosis not present

## 2023-07-14 DIAGNOSIS — M62511 Muscle wasting and atrophy, not elsewhere classified, right shoulder: Secondary | ICD-10-CM | POA: Diagnosis not present

## 2023-07-16 DIAGNOSIS — R2681 Unsteadiness on feet: Secondary | ICD-10-CM | POA: Diagnosis not present

## 2023-07-16 DIAGNOSIS — R2689 Other abnormalities of gait and mobility: Secondary | ICD-10-CM | POA: Diagnosis not present

## 2023-07-19 DIAGNOSIS — R2681 Unsteadiness on feet: Secondary | ICD-10-CM | POA: Diagnosis not present

## 2023-07-19 DIAGNOSIS — M62511 Muscle wasting and atrophy, not elsewhere classified, right shoulder: Secondary | ICD-10-CM | POA: Diagnosis not present

## 2023-07-19 DIAGNOSIS — R2689 Other abnormalities of gait and mobility: Secondary | ICD-10-CM | POA: Diagnosis not present

## 2023-07-19 DIAGNOSIS — R296 Repeated falls: Secondary | ICD-10-CM | POA: Diagnosis not present

## 2023-07-19 DIAGNOSIS — M62512 Muscle wasting and atrophy, not elsewhere classified, left shoulder: Secondary | ICD-10-CM | POA: Diagnosis not present

## 2023-07-19 DIAGNOSIS — M6259 Muscle wasting and atrophy, not elsewhere classified, multiple sites: Secondary | ICD-10-CM | POA: Diagnosis not present

## 2023-07-22 DIAGNOSIS — R2681 Unsteadiness on feet: Secondary | ICD-10-CM | POA: Diagnosis not present

## 2023-07-22 DIAGNOSIS — R2689 Other abnormalities of gait and mobility: Secondary | ICD-10-CM | POA: Diagnosis not present

## 2023-08-26 ENCOUNTER — Ambulatory Visit: Payer: Medicare HMO | Admitting: *Deleted

## 2023-08-26 DIAGNOSIS — Z0000 Encounter for general adult medical examination without abnormal findings: Secondary | ICD-10-CM

## 2023-08-26 NOTE — Patient Instructions (Signed)
Joshua [name] , Thank you for taking time to come for your Medicare Wellness Visit. I appreciate your ongoing commitment to your health goals. Please review the following plan we discussed and let me know if I can assist you in the future.     This is a list of the screening recommended for you and due dates:  Health Maintenance  Topic Date Due   COVID-19 Vaccine (7 - 2023-24 season) 06/20/2023   Medicare Annual Wellness Visit  08/25/2024   DTaP/Tdap/Td vaccine (2 - Td or Tdap) 04/22/2028   Pneumonia Vaccine  Completed   Flu Shot  Completed   HPV Vaccine  Aged Out   Zoster (Shingles) Vaccine  Discontinued    Next appointment: Follow up in one year for your annual wellness visit.   Preventive Care 54 Years and Older, Male Preventive care refers to lifestyle choices and visits with your health care provider that can promote health and wellness. What does preventive care include? A yearly physical exam. This is also called an annual well check. Dental exams once or twice a year. Routine eye exams. Ask your health care provider how often you should have your eyes checked. Personal lifestyle choices, including: Daily care of your teeth and gums. Regular physical activity. Eating a healthy diet. Avoiding tobacco and drug use. Limiting alcohol use. Practicing safe sex. Taking low doses of aspirin every day. Taking vitamin and mineral supplements as recommended by your health care provider. What happens during an annual well check? The services and screenings done by your health care provider during your annual well check will depend on your age, overall health, lifestyle risk factors, and family history of disease. Counseling  Your health care provider may ask you questions about your: Alcohol use. Tobacco use. Drug use. Emotional well-being. Home and relationship well-being. Sexual activity. Eating habits. History of falls. Memory and ability to understand (cognition). Work  and work Astronomer. Screening  You may have the following tests or measurements: Height, weight, and BMI. Blood pressure. Lipid and cholesterol levels. These may be checked every 5 years, or more frequently if you are over 96 years old. Skin check. Lung cancer screening. You may have this screening every year starting at age 38 if you have a 30-pack-year history of smoking and currently smoke or have quit within the past 15 years. Fecal occult blood test (FOBT) of the stool. You may have this test every year starting at age 58. Flexible sigmoidoscopy or colonoscopy. You may have a sigmoidoscopy every 5 years or a colonoscopy every 10 years starting at age 4. Prostate cancer screening. Recommendations will vary depending on your family history and other risks. Hepatitis C blood test. Hepatitis B blood test. Sexually transmitted disease (STD) testing. Diabetes screening. This is done by checking your blood sugar (glucose) after you have not eaten for a while (fasting). You may have this done every 1-3 years. Abdominal aortic aneurysm (AAA) screening. You may need this if you are a current or former smoker. Osteoporosis. You may be screened starting at age 25 if you are at high risk. Talk with your health care provider about your test results, treatment options, and if necessary, the need for more tests. Vaccines  Your health care provider may recommend certain vaccines, such as: Influenza vaccine. This is recommended every year. Tetanus, diphtheria, and acellular pertussis (Tdap, Td) vaccine. You may need a Td booster every 10 years. Zoster vaccine. You may need this after age 67. Pneumococcal 13-valent conjugate (PCV13) vaccine.  One dose is recommended after age 3. Pneumococcal polysaccharide (PPSV23) vaccine. One dose is recommended after age 24. Talk to your health care provider about which screenings and vaccines you need and how often you need them. This information is not intended  to replace advice given to you by your health care provider. Make sure you discuss any questions you have with your health care provider. Document Released: 11/01/2015 Document Revised: 06/24/2016 Document Reviewed: 08/06/2015 Elsevier Interactive Patient Education  2017 [employer].  Fall Prevention in the Home Falls can cause injuries. They can happen to people of all ages. There are many things you can do to make your home safe and to help prevent falls. What can I do on the outside of my home? Regularly fix the edges of walkways and driveways and fix any cracks. Remove anything that might make you trip as you walk through a door, such as a raised step or threshold. Trim any bushes or trees on the path to your home. Use bright outdoor lighting. Clear any walking paths of anything that might make someone trip, such as rocks or tools. Regularly check to see if handrails are loose or broken. Make sure that both sides of any steps have handrails. Any raised decks and porches should have guardrails on the edges. Have any leaves, snow, or ice cleared regularly. Use sand or salt on walking paths during winter. Clean up any spills in your garage right away. This includes oil or grease spills. What can I do in the bathroom? Use night lights. Install grab bars by the toilet and in the tub and shower. Do not use towel bars as grab bars. Use non-skid mats or decals in the tub or shower. If you need to sit down in the shower, use a plastic, non-slip stool. Keep the floor dry. Clean up any water that spills on the floor as soon as it happens. Remove soap buildup in the tub or shower regularly. Attach bath mats securely with double-sided non-slip rug tape. Do not have throw rugs and other things on the floor that can make you trip. What can I do in the bedroom? Use night lights. Make sure that you have a light by your bed that is easy to reach. Do not use any sheets or blankets that are too big  for your bed. They should not hang down onto the floor. Have a firm chair that has side arms. You can use this for support while you get dressed. Do not have throw rugs and other things on the floor that can make you trip. What can I do in the kitchen? Clean up any spills right away. Avoid walking on wet floors. Keep items that you use a lot in easy-to-reach places. If you need to reach something above you, use a strong step stool that has a grab bar. Keep electrical cords out of the way. Do not use floor polish or wax that makes floors slippery. If you must use wax, use non-skid floor wax. Do not have throw rugs and other things on the floor that can make you trip. What can I do with my stairs? Do not leave any items on the stairs. Make sure that there are handrails on both sides of the stairs and use them. Fix handrails that are broken or loose. Make sure that handrails are as long as the stairways. Check any carpeting to make sure that it is firmly attached to the stairs. Fix any carpet that is loose or  worn. Avoid having throw rugs at the top or bottom of the stairs. If you do have throw rugs, attach them to the floor with carpet tape. Make sure that you have a light switch at the top of the stairs and the bottom of the stairs. If you do not have them, ask someone to add them for you. What else can I do to help prevent falls? Wear shoes that: Do not have high heels. Have rubber bottoms. Are comfortable and fit you well. Are closed at the toe. Do not wear sandals. If you use a stepladder: Make sure that it is fully opened. Do not climb a closed stepladder. Make sure that both sides of the stepladder are locked into place. Ask someone to hold it for you, if possible. Clearly mark and make sure that you can see: Any grab bars or handrails. First and last steps. Where the edge of each step is. Use tools that help you move around (mobility aids) if they are needed. These  include: Canes. Walkers. Scooters. Crutches. Turn on the lights when you go into a dark area. Replace any light bulbs as soon as they burn out. Set up your furniture so you have a clear path. Avoid moving your furniture around. If any of your floors are uneven, fix them. If there are any pets around you, be aware of where they are. Review your medicines with your doctor. Some medicines can make you feel dizzy. This can increase your chance of falling. Ask your doctor what other things that you can do to help prevent falls. This information is not intended to replace advice given to you by your health care provider. Make sure you discuss any questions you have with your health care provider. Document Released: 08/01/2009 Document Revised: 03/12/2016 Document Reviewed: 11/09/2014 Elsevier Interactive Patient Education  2017 [employer].

## 2023-08-26 NOTE — Progress Notes (Signed)
Subjective:   Joshua [name] is a 87 y.o. male who presents for Medicare Annual/Subsequent preventive examination.  Visit Complete: Virtual I connected with  Joshua [name] on 08/26/23 by a audio enabled telemedicine application and verified that I am speaking with the correct person using two identifiers.  Patient Location: Home  Provider Location: Office/Clinic  I discussed the limitations of evaluation and management by telemedicine. The patient expressed understanding and agreed to proceed.  Vital Signs: Because this visit was a virtual/telehealth visit, some criteria may be missing or patient reported. Any vitals not documented were not able to be obtained and vitals that have been documented are patient reported.   Cardiac Risk Factors include: advanced age (>33men, >28 women);male gender;dyslipidemia;hypertension     Objective:    There were no vitals filed for this visit. There is no height or weight on file to calculate BMI.     08/26/2023    2:26 PM 08/10/2022    7:00 PM 07/28/2022    1:47 PM 06/20/2022   12:31 PM 06/09/2022    9:00 PM 12/15/2021   10:16 AM 12/04/2021    9:46 AM  Advanced Directives  Does Patient Have a Medical Advance Directive? No Yes Yes No No No Yes  Type of Science writer of [place];Living will    Healthcare Power of [place];Living will  Does patient want to make changes to medical advance directive?  No - Patient declined No - Patient declined   No - Patient declined No - Patient declined  Copy of Healthcare Power of Attorney in Chart?  No - copy requested Yes - validated most recent copy scanned in chart (See row information)    Yes - validated most recent copy scanned in chart (See row information)  Would patient like information on creating a medical advance directive? No - Patient declined          Current Medications (verified) Outpatient Encounter Medications as of 08/26/2023   Medication Sig   aspirin EC 81 MG tablet Take 1 tablet (81 mg total) by mouth daily. Swallow whole.   atorvastatin (LIPITOR) 80 MG tablet Take 1 tablet (80 mg total) by mouth daily.   Glucosamine HCl (GLUCOSAMINE PO) Take 3,000 mg by mouth every other day.   melatonin 5 MG TABS Take 1 tablet (5 mg total) by mouth at bedtime as needed.   metoprolol tartrate (LOPRESSOR) 25 MG tablet TAKE 1/2 TABLET TWICE DAILY, HOLD FOR HEART RATE LESS THAN 60, SYSTOLIC BLOOD PRESSURE LESS THAN OR EQUAL TO 105   Multiple Vitamin (MULTIVITAMIN) capsule Take 1 capsule by mouth daily.   omeprazole (PRILOSEC) 40 MG capsule TAKE 1 CAPSULE EVERY DAY   vitamin B-12 (CYANOCOBALAMIN) 1000 MCG tablet Take 1,000 mcg by mouth daily.   No facility-administered encounter medications on file as of 08/26/2023.    Allergies (verified) Patient has no known allergies.   History: Past Medical History:  Diagnosis Date   AKI (acute kidney injury) (HCC) 08/10/2022   Arthritis    Cataract    Esophageal stricture    GERD (gastroesophageal reflux disease)    Hyperlipidemia    Hypertension    Liver disease    hepatitis   Normocytic anemia 06/04/2022   Pacemaker    Rhabdomyolysis 06/04/2022   Severe aortic stenosis    Past Surgical History:  Procedure Laterality Date   AORTIC VALVE REPLACEMENT N/A 11/04/2021   Procedure: AORTIC VALVE REPLACEMENT (AVR) USING 23 MM INSPIRIS  RESILIA  AORTIC VALVE;  Surgeon: Corliss Skains, MD;  Location: [facility] OR;  Service: Open Heart Surgery;  Laterality: N/A;   cataract  2013   CORONARY ARTERY BYPASS GRAFT N/A 11/04/2021   Procedure: CORONARY ARTERY BYPASS GRAFTING (CABG) X 3, USING LEFT INTERNAL MAMMARY ARTERY AND ENDOSCOPICALLY HARVESTED RIGHT GREATER SAPHENOUS VEIN. LIMA TO LAD, SVG TO PDA, SVG TO OM;  Surgeon: Corliss Skains, MD;  Location: [facility] OR;  Service: Open Heart Surgery;  Laterality: N/A;   CORONARY PRESSURE/FFR STUDY N/A 11/03/2021   Procedure: INTRAVASCULAR PRESSURE  WIRE/FFR STUDY;  Surgeon: Kathleene Hazel, MD;  Location: [facility] INVASIVE CV LAB;  Service: Cardiovascular;  Laterality: N/A;   ENDOVEIN HARVEST OF GREATER SAPHENOUS VEIN Right 11/04/2021   Procedure: ENDOVEIN HARVEST OF GREATER SAPHENOUS VEIN;  Surgeon: Corliss Skains, MD;  Location: [facility] OR;  Service: Open Heart Surgery;  Laterality: Right;   EXPLORATION POST OPERATIVE OPEN HEART N/A 11/05/2021   Procedure: EXPLORATION POST OPERATIVE OPEN HEARTFOR BLEEDING;  Surgeon: Loreli Slot, MD;  Location: [facility] OR;  Service: Open Heart Surgery;  Laterality: N/A;   HEMORROIDECTOMY  2001   HERNIA REPAIR  2005   PACEMAKER IMPLANT N/A 11/09/2021   Procedure: PACEMAKER IMPLANT;  Surgeon: Lanier Prude, MD;  Location[facility] INVASIVE CV LAB;  Service: Cardiovascular;  Laterality: N/A;   RIGHT/LEFT HEART CATH AND CORONARY ANGIOGRAPHY N/A 11/03/2021   Procedure: RIGHT/LEFT HEART CATH AND CORONARY ANGIOGRAPHY;  Surgeon: Kathleene Hazel, MD;  Location: [facility] INVASIVE CV LAB;  Service: Cardiovascular;  Laterality: N/A;   TEE WITHOUT CARDIOVERSION N/A 11/04/2021   Procedure: TRANSESOPHAGEAL ECHOCARDIOGRAM (TEE);  Surgeon: Corliss Skains, MD;  Location: [facility] OR;  Service: Open Heart Surgery;  Laterality: N/A;   TEMPORARY PACEMAKER N/A 11/07/2021   Procedure: TEMPORARY PACEMAKER;  Surgeon: Orbie Pyo, MD;  Location: [facility] INVASIVE CV LAB;  Service: Cardiovascular;  Laterality: N/A;   TONSILLECTOMY  1948   Family History  Problem Relation Age of Onset   Heart failure Mother 8   Heart failure Father 75       Scarlet fever as child-Valve disease   Cancer Maternal Grandmother    Diabetes Child    Heart Problems Child 80       cardiac arrest   Social History   Socioeconomic History   Marital status: Widowed    Spouse name: Not on file   Number of children: 3   Years of education: Not on file   Highest education level: Not on file  Occupational History   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  Tobacco Use   Smoking status: Former    Current packs/day: 0.00    Types: Cigarettes    Quit date: 1965    Years since quitting: 59.8   Smokeless tobacco: Never  Vaping Use   Vaping status: Never Used  Substance and Sexual Activity   Alcohol use: Yes    Alcohol/week: 21.0 standard drinks of alcohol    Types: 21 Shots of liquor per week    Comment: daily a few cocktails   Drug use: No   Sexual activity: Never  Other Topics Concern   Not on file  Social History Narrative   Epworth Sleepiness Scale = 3 (as of 07/15/2016)   Social Determinants of Health   Financial Resource Strain: Low Risk  (08/26/2023)   Overall Financial Resource Strain (CARDIA)    Difficulty of Paying Living Expenses: Not  hard at all  Food Insecurity: No Food Insecurity (08/26/2023)   Hunger Vital Sign    Worried About Running Out of Food in the Last Year: Never true    Ran Out of Food in the Last Year: Never true  Transportation Needs: No Transportation Needs (08/26/2023)   PRAPARE - Administrator, Civil Service (Medical): No    Lack of Transportation (Non-Medical): No  Physical Activity: Insufficiently Active (08/26/2023)   Exercise Vital Sign    Days of Exercise per Week: 2 days    Minutes of Exercise per Session: 60 min  Stress: No Stress Concern Present (08/26/2023)   [employer] of Occupational Health - Occupational Stress Questionnaire    Feeling of Stress : Not at all  Social Connections: Socially Isolated (08/26/2023)   Social Connection and Isolation Panel [NHANES]    Frequency of Communication with Friends and Family: More than three times a week    Frequency of Social Gatherings with Friends and Family: Twice a week    Attends Religious Services: Never    Database administrator or Organizations: No    Attends Banker Meetings: Never    Marital Status: Widowed    Tobacco Counseling Counseling given: Not  Answered   Clinical Intake:  Pre-visit preparation completed: Yes  Pain : No/denies pain  Nutritional Risks: None Diabetes: No  How often do you need to have someone help you when you read instructions, pamphlets, or other written materials from your doctor or pharmacy?: 1 - Never  Interpreter Needed?: No  Information entered by :: Donne Anon, CMA   Activities of Daily Living    08/26/2023    2:23 PM  In your present state of health, do you have any difficulty performing the following activities:  Hearing? 0  Vision? 0  Difficulty concentrating or making decisions? 0  Walking or climbing stairs? 0  Dressing or bathing? 0  Doing errands, shopping? 1  Comment son Insurance claims handler and eating ? N  Using the Toilet? N  In the past six months, have you accidently leaked urine? N  Do you have problems with loss of bowel control? N  Managing your Medications? N  Managing your Finances? N  Housekeeping or managing your Housekeeping? N    Patient Care Team: Sharlene Dory, DO as PCP - General (Family Medicine) Lanier Prude, MD as PCP - Electrophysiology (Cardiology) Croitoru, Rachelle Hora, MD as PCP - Cardiology (Cardiology)  Indicate any recent Medical Services you may have received from other than [facility] providers in the past year (date may be approximate).     Assessment:   This is a routine wellness examination for Bertin.  Hearing/Vision screen No results found.   Goals Addressed   None    Depression Screen    08/26/2023    2:31 PM 11/18/2022   10:33 AM 07/28/2022    1:48 PM 07/24/2021    1:12 PM 04/09/2021   10:10 AM 04/01/2020    1:11 PM 12/28/2017   10:21 AM  PHQ 2/9 Scores  PHQ - 2 Score 0 0 0 1 0 1 0  PHQ- 9 Score  0         Fall Risk    08/26/2023    2:26 PM 11/18/2022   10:30 AM 07/28/2022    1:47 PM 08/01/2021    4:03 PM 07/24/2021    1:10 PM  Fall Risk   Falls in the past year? 1 1 1  1 0  Number falls in past yr: 1 0 1 0 0   Injury with Fall? 0 1 1 0 0  Comment  Right hand and arm weakness from the fall     Risk for fall due to : Impaired balance/gait;History of fall(s) Medication side effect;Impaired balance/gait History of fall(s)  No Fall Risks  Risk for fall due to: Comment  Family thinks Post Surgery and doing better after medication adjustments.  BP dropping too low.     Follow up Falls evaluation completed Falls evaluation completed Falls evaluation completed Falls evaluation completed     MEDICARE RISK AT HOME: Medicare Risk at Home Any stairs in or around the home?: No If so, are there any without handrails?: No Home free of loose throw rugs in walkways, pet beds, electrical cords, etc?: Yes Adequate lighting in your home to reduce risk of falls?: Yes Life alert?: No Use of a cane, walker or w/c?: Yes Grab bars in the bathroom?: No (just in the shower) Shower chair or bench in shower?: No Elevated toilet seat or a handicapped toilet?: No  TIMED UP AND GO:  Was the test performed?  No    Cognitive Function:    12/28/2017   10:25 AM  MMSE - Mini Mental State Exam  Orientation to time 5  Orientation to Place 5  Registration 3  Attention/ Calculation 5  Recall 3  Language- name 2 objects 2  Language- repeat 1  Language- follow 3 step command 3  Language- read & follow direction 1  Write a sentence 1  Copy design 1  Total score 30        08/26/2023    2:32 PM 07/28/2022    1:55 PM  6CIT Screen  What Year? 0 points 0 points  What month? 0 points 0 points  What time? 3 points 0 points  Count back from 20 4 points 0 points  Months in reverse 0 points 0 points  Repeat phrase 4 points 0 points  Total Score 11 points 0 points    Immunizations Immunization History  Administered Date(s) Administered   Fluad Quad(high Dose 65+) 08/07/2022   Influenza, High Dose Seasonal PF 07/19/2017, 06/19/2020, 07/15/2023   Influenza-Unspecified 07/20/2015, 07/10/2021   PFIZER Comirnaty(Gray  Top)Covid-19 Tri-Sucrose Vaccine 01/23/2021   PFIZER(Purple Top)SARS-COV-2 Vaccination 12/03/2019, 12/25/2019, 07/24/2020   PNEUMOCOCCAL CONJUGATE-20 10/24/2021   Pfizer Covid-19 Vaccine Bivalent Booster 28yrs & up 07/10/2021, 05/26/2022   Pneumococcal Polysaccharide-23 04/22/2018   Tdap 04/22/2018    TDAP status: Up to date  Flu Vaccine status: Up to date  Pneumococcal vaccine status: Up to date  Covid-19 vaccine status: Information provided on how to obtain vaccines.   Qualifies for Shingles Vaccine? Yes   Zostavax completed No   Shingrix Completed?: No.    Education has been provided regarding the importance of this vaccine. Patient has been advised to call insurance company to determine out of pocket expense if they have not yet received this vaccine. Advised may also receive vaccine at local pharmacy or Health Dept. Verbalized acceptance and understanding.  Screening Tests Health Maintenance  Topic Date Due   COVID-19 Vaccine (7 - 2023-24 season) 06/20/2023   Medicare Annual Wellness (AWV)  07/29/2023   DTaP/Tdap/Td (2 - Td or Tdap) 04/22/2028   Pneumonia Vaccine 25+ Years old  Completed   INFLUENZA VACCINE  Completed   HPV VACCINES  Aged Out   Zoster Vaccines- Shingrix  Discontinued    Health Maintenance  Health Maintenance Due  Topic Date Due   COVID-19 Vaccine (7 - 2023-24 season) 06/20/2023   Medicare Annual Wellness (AWV)  07/29/2023    Colorectal cancer screening: No longer required.   Lung Cancer Screening: (Low Dose CT Chest recommended if Age 68-80 years, 20 pack-year currently smoking OR have quit w/in 15years.) does not qualify.   Additional Screening:  Hepatitis C Screening: does not qualify  Vision Screening: Recommended annual ophthalmology exams for early detection of glaucoma and other disorders of the eye. Is the patient up to date with their annual eye exam?  No  Who is the provider or what is the name of the office in which the patient attends  annual eye exams? Can't remember name at this time If pt is not established with a provider, would they like to be referred to a provider to establish care? No .   Dental Screening: Recommended annual dental exams for proper oral hygiene  Diabetic Foot Exam: N/a  Community Resource Referral / Chronic Care Management: CRR required this visit?  No   CCM required this visit?  No     Plan:     I have personally reviewed and noted the following in the patient's chart:   Medical and social history Use of alcohol, tobacco or illicit drugs  Current medications and supplements including opioid prescriptions. Patient is not currently taking opioid prescriptions. Functional ability and status Nutritional status Physical activity Advanced directives List of other physicians Hospitalizations, surgeries, and ER visits in previous 12 months Vitals Screenings to include cognitive, depression, and falls Referrals and appointments  In addition, I have reviewed and discussed with patient certain preventive protocols, quality metrics, and best practice recommendations. A written personalized care plan for preventive services as well as general preventive health recommendations were provided to patient.     Donne Anon, CMA   08/26/2023   After Visit Summary: (MyChart) Due to this being a telephonic visit, the after visit summary with patients personalized plan was offered to patient via MyChart   Nurse Notes: None

## 2023-08-28 ENCOUNTER — Other Ambulatory Visit: Payer: Self-pay | Admitting: Family Medicine

## 2023-08-28 DIAGNOSIS — I10 Essential (primary) hypertension: Secondary | ICD-10-CM

## 2023-08-30 ENCOUNTER — Ambulatory Visit (INDEPENDENT_AMBULATORY_CARE_PROVIDER_SITE_OTHER): Payer: Medicare HMO

## 2023-08-30 DIAGNOSIS — I442 Atrioventricular block, complete: Secondary | ICD-10-CM | POA: Diagnosis not present

## 2023-08-31 ENCOUNTER — Encounter: Payer: Self-pay | Admitting: Neurology

## 2023-08-31 ENCOUNTER — Ambulatory Visit: Payer: Medicare HMO | Admitting: Neurology

## 2023-08-31 VITALS — BP 138/86 | HR 95 | Ht 71.0 in | Wt 176.0 lb

## 2023-08-31 DIAGNOSIS — R404 Transient alteration of awareness: Secondary | ICD-10-CM | POA: Diagnosis not present

## 2023-08-31 DIAGNOSIS — G459 Transient cerebral ischemic attack, unspecified: Secondary | ICD-10-CM | POA: Diagnosis not present

## 2023-08-31 LAB — CUP PACEART REMOTE DEVICE CHECK
Battery Remaining Longevity: 140 mo
Battery Voltage: 3.03 V
Brady Statistic AP VP Percent: 11.53 %
Brady Statistic AP VS Percent: 0.57 %
Brady Statistic AS VP Percent: 52.85 %
Brady Statistic AS VS Percent: 35.05 %
Brady Statistic RA Percent Paced: 12.13 %
Brady Statistic RV Percent Paced: 64.38 %
Date Time Interrogation Session: 20241111062102
Implantable Lead Connection Status: 753985
Implantable Lead Connection Status: 753985
Implantable Lead Implant Date: 20230122
Implantable Lead Implant Date: 20230122
Implantable Lead Location: 753859
Implantable Lead Location: 753860
Implantable Lead Model: 5076
Implantable Lead Model: 5076
Implantable Pulse Generator Implant Date: 20230122
Lead Channel Impedance Value: 323 Ohm
Lead Channel Impedance Value: 380 Ohm
Lead Channel Impedance Value: 399 Ohm
Lead Channel Impedance Value: 418 Ohm
Lead Channel Pacing Threshold Amplitude: 0.625 V
Lead Channel Pacing Threshold Amplitude: 0.625 V
Lead Channel Pacing Threshold Pulse Width: 0.4 ms
Lead Channel Pacing Threshold Pulse Width: 0.4 ms
Lead Channel Sensing Intrinsic Amplitude: 1 mV
Lead Channel Sensing Intrinsic Amplitude: 1 mV
Lead Channel Sensing Intrinsic Amplitude: 3 mV
Lead Channel Sensing Intrinsic Amplitude: 3 mV
Lead Channel Setting Pacing Amplitude: 1.5 V
Lead Channel Setting Pacing Amplitude: 2 V
Lead Channel Setting Pacing Pulse Width: 0.4 ms
Lead Channel Setting Sensing Sensitivity: 1.2 mV
Zone Setting Status: 755011
Zone Setting Status: 755011

## 2023-08-31 NOTE — Progress Notes (Signed)
[facility] [address] [place]. [place] [address] [phone]       OFFICE CONSULT NOTE  [name] Date of Birth:  24-Apr-1936 Medical Record Number:  [id number]   Referring MD: Carmelia Roller  Reason for Referral: TIA  HPI: Joshua [name] is a 87 year old pleasant Caucasian male seen today for initial office consultation visit.  He is accompanied by his daughter-in-law.  History is obtained from them and review of electronic medical records and records in care everywhere this hospital admission in [place].  Patient has past medical history of hypertension, hyperlipidemia, coronary artery disease status post bioprosthetic aortic valve, anemia, liver disease, esophageal stricture and arthritis.  Patient was visiting family near [place] in July.  On 05/05/2023 his son noticed that patient was sleepy and did not wake up At 10 AM.  The son checked on him 2 hours later noticed that patient kept falling asleep.  EMS was called and he was brought to the ER.  He was noted as having weakness in his left arm.  This was transient and improved.  CT head was unremarkable.  CT angiogram of the head showed 70% proximal right ICA and 50% left proximal carotid stenosis.  There is no intracranial stenosis.  MRI scan of the brain was obtained which showed no acute abnormality.  There are mild changes of small vessel disease.  Vascular surgery was consulted for carotid stenosis and they obtained a carotid ultrasound which actually showed bilateral less than 50% stenosis and no vascular intervention was deemed necessary.  Echocardiogram showed normal ejection fraction without cardiac source of embolism or right-to-left shunt.  LDL cholesterol is optimal at 61 mg percent and hemoglobin A1c at 4.9.  The patient has poor recall of the events of the day and hospitalization.  There was no tongue bite, incontinence or any wound or injury noted.  There is no prior history of seizures,  strokes or TIAs.  Patient was continued on aspirin and medications for hypertension and cholesterol.  He states is done well since then.  He has had no recurrent TIA or stroke symptoms or seizure-like events.  He does have history of gait and balance difficulties and frequent falls and uses a walker.  He has had no recent falls.  Does have mild short-term memory difficulties.  He lives at [facility]. independent living.  He has not been driving for years but otherwise is mostly independent in all activities of daily living.  He does not smoke.  He has 2-3 alcoholic drinks every day.  He has no prior history of syncope, passing out or cardiac arrhythmias.  He does have a pacemaker and he has not had any recent appointment with his cardiologist. ROS:   14 system review of systems is positive for decreased responsiveness, sleepiness, decreased memory, falls, gait and balance difficulties and all other systems negative  PMH:  Past Medical History:  Diagnosis Date   AKI (acute kidney injury) (HCC) 08/10/2022   Arthritis    Cataract    Esophageal stricture    GERD (gastroesophageal reflux disease)    Hyperlipidemia    Hypertension    Liver disease    hepatitis   Normocytic anemia 06/04/2022   Pacemaker    Rhabdomyolysis 06/04/2022   Severe aortic stenosis     Social History:  Social History   Socioeconomic History   Marital status: Widowed    Spouse name: Not on file   Number of children: 3   Years of education: Not  on file   Highest education level: Not on file  Occupational History   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  Tobacco Use   Smoking status: Former    Current packs/day: 0.00    Types: Cigarettes    Quit date: 1965    Years since quitting: 59.9   Smokeless tobacco: Never  Vaping Use   Vaping status: Never Used  Substance and Sexual Activity   Alcohol use: Yes    Alcohol/week: 21.0 standard drinks of alcohol     Types: 21 Shots of liquor per week    Comment: daily a few cocktails   Drug use: No   Sexual activity: Never  Other Topics Concern   Not on file  Social History Narrative   Epworth Sleepiness Scale = 3 (as of 07/15/2016)   Social Determinants of Health   Financial Resource Strain: Low Risk  (08/26/2023)   Overall Financial Resource Strain (CARDIA)    Difficulty of Paying Living Expenses: Not hard at all  Food Insecurity: No Food Insecurity (08/26/2023)   Hunger Vital Sign    Worried About Running Out of Food in the Last Year: Never true    Ran Out of Food in the Last Year: Never true  Transportation Needs: No Transportation Needs (08/26/2023)   PRAPARE - Administrator, Civil Service (Medical): No    Lack of Transportation (Non-Medical): No  Physical Activity: Insufficiently Active (08/26/2023)   Exercise Vital Sign    Days of Exercise per Week: 2 days    Minutes of Exercise per Session: 60 min  Stress: No Stress Concern Present (08/26/2023)   [employer] of Occupational Health - Occupational Stress Questionnaire    Feeling of Stress : Not at all  Social Connections: Socially Isolated (08/26/2023)   Social Connection and Isolation Panel [NHANES]    Frequency of Communication with Friends and Family: More than three times a week    Frequency of Social Gatherings with Friends and Family: Twice a week    Attends Religious Services: Never    Database administrator or Organizations: No    Attends Banker Meetings: Never    Marital Status: Widowed  Intimate Partner Violence: Not At Risk (08/26/2023)   Humiliation, Afraid, Rape, and Kick questionnaire    Fear of Current or Ex-Partner: No    Emotionally Abused: No    Physically Abused: No    Sexually Abused: No    Medications:   Current Outpatient Medications on File Prior to Visit  Medication Sig Dispense Refill   aspirin EC 81 MG tablet Take 1 tablet (81 mg total) by mouth daily. Swallow whole. 90  tablet 3   atorvastatin (LIPITOR) 80 MG tablet Take 1 tablet (80 mg total) by mouth daily. 90 tablet 3   Glucosamine HCl (GLUCOSAMINE PO) Take 3,000 mg by mouth every other day.     metoprolol tartrate (LOPRESSOR) 25 MG tablet TAKE 1/2 TABLET TWICE DAILY, HOLD FOR HEART RATE LESS THAN 60, SYSTOLIC BLOOD PRESSURE LESS THAN OR EQUAL TO 105 90 tablet 3   Multiple Vitamin (MULTIVITAMIN) capsule Take 1 capsule by mouth daily.     omeprazole (PRILOSEC) 40 MG capsule TAKE 1 CAPSULE EVERY DAY 90 capsule 10   vitamin B-12 (CYANOCOBALAMIN) 1000 MCG tablet Take 1,000 mcg by mouth daily.     melatonin 5 MG TABS Take 1 tablet (5 mg total) by mouth at bedtime as needed. (Patient not taking:  Reported on 08/31/2023) 30 tablet 0   No current facility-administered medications on file prior to visit.    Allergies:  No Known Allergies  Physical Exam General: well developed, well nourished pleasant elderly Caucasian male, seated, in no evident distress Head: head normocephalic and atraumatic.   Neck: supple with no carotid or supraclavicular bruits Cardiovascular: regular rate and rhythm, no murmurs Musculoskeletal: Mild kyphoscoliosis skin:  no rash/petichiae Vascular:  Normal pulses all extremities  Neurologic Exam Mental Status: Awake and fully alert. Oriented to place and time. Recent and remote memory diminished attention span, concentration and fund of knowledge diminished. Mood and affect appropriate.  Cranial Nerves: Fundoscopic exam reveals sharp disc margins. Pupils equal, briskly reactive to light. Extraocular movements full without nystagmus. Visual fields full to confrontation. Hearing mildly diminished bilaterally. Facial sensation intact. Face, tongue, palate moves normally and symmetrically.  Motor: Normal bulk and tone. Normal strength in all tested extremity muscles. Sensory.: intact to touch , pinprick , position and vibratory sensation.  Coordination: Rapid alternating movements normal in  all extremities. Finger-to-nose and heel-to-shin performed accurately bilaterally. Gait and Station: Arises from chair without difficulty. Stance is stooped.  Uses a wheeled walker and walks with a broad-based cautious gait.  Slightly unsteady when he makes a turn.   Reflexes: 1+ and symmetric. Toes downgoing.   NIHSS  0 Modified Rankin  1   ASSESSMENT: 87 year old pleasant Caucasian male with episode of brief unresponsiveness and altered mental status in July 2024 of unclear etiology.  Possibilities include posterior circulation TIA/small infarct not visualized on MRI versus syncopal event from unrecognized arrhythmia or unwitnessed seizure with postictal state.     PLAN:I had a long discussion with the patient and his daughter-in-law regarding his episode of unresponsiveness July 2024 unclear etiology.  Possibilities include small brainstem infarct not visualized on MRI versus syncopal event or unwitnessed seizure and postictal state.  I recommend further evaluation with checking EEG.  Will request cardiology to interrogate his pacemaker for any paroxysmal arrhythmias.  Continue aspirin for stroke prevention and maintain aggressive risk factor modification with strict control of hypertension and blood pressure goal below 130/90, lipids with LDL cholesterol goal below 70 mg percent and diabetes hemoglobin A1c goal below 6.5%.  He does have moderate right carotid stenosis on CT angio but this is not concordant with carotid ultrasound hence will recommend aggressive medical management and recheck ultrasound at next visit.  He is also advised to use his walker at all times and discussed fall prevention precautions.  He will return for follow-up in the future in 4 months or call earlier if necessary.  Greater than 50% time during this 45-minute consultation visit was spent in count and coordination of care about his sort of unresponsiveness discussion about differential diagnosis and evaluation plan  and  Delia Heady, MD Note: This document was prepared with digital dictation and possible smart phrase technology. Any transcriptional errors that result from this process are unintentional.

## 2023-08-31 NOTE — Patient Instructions (Signed)
I had a long discussion with the patient and his daughter-in-law regarding his episode of unresponsiveness July 2024 unclear etiology.  Possibilities include small brainstem infarct not visualized on MRI versus syncopal event or unwitnessed seizure and postictal state.  I recommend further evaluation with checking EEG.  Will request cardiology to interrogate his pacemaker for any paroxysmal arrhythmias.  Continue aspirin for stroke prevention and maintain aggressive risk factor modification with strict control of hypertension and blood pressure goal below 130/90, lipids with LDL cholesterol goal below 70 mg percent and diabetes hemoglobin A1c goal below 6.5%.  He does have moderate right carotid stenosis on CT angio but this is not concordant with carotid ultrasound hence will recommend aggressive medical management and recheck ultrasound at next visit.  He is also advised to use his walker at all times and discussed fall prevention precautions.  He will return for follow-up in the future in 4 months or call earlier if necessary.  Stroke Prevention Some medical conditions and behaviors can lead to a higher chance of having a stroke. You can help prevent a stroke by eating healthy, exercising, not smoking, and managing any medical conditions you have. Stroke is a leading cause of functional impairment. Primary prevention is particularly important because a majority of strokes are first-time events. Stroke changes the lives of not only those who experience a stroke but also their family and other caregivers. How can this condition affect me? A stroke is a medical emergency and should be treated right away. A stroke can lead to brain damage and can sometimes be life-threatening. If a person gets medical treatment right away, there is a better chance of surviving and recovering from a stroke. What can increase my risk? The following medical conditions may increase your risk of a stroke: Cardiovascular  disease. High blood pressure (hypertension). Diabetes. High cholesterol. Sickle cell disease. Blood clotting disorders (hypercoagulable state). Obesity. Sleep disorders (obstructive sleep apnea). Other risk factors include: Being older than age 40. Having a history of blood clots, stroke, or mini-stroke (transient ischemic attack, TIA). Genetic factors, such as race, ethnicity, or a family history of stroke. Smoking cigarettes or using other tobacco products. Taking birth control pills, especially if you also use tobacco. Heavy use of alcohol or drugs, especially cocaine and methamphetamine. Physical inactivity. What actions can I take to prevent this? Manage your health conditions High cholesterol levels. Eating a healthy diet is important for preventing high cholesterol. If cholesterol cannot be managed through diet alone, you may need to take medicines. Take any prescribed medicines to control your cholesterol as told by your health care provider. Hypertension. To reduce your risk of stroke, try to keep your blood pressure below 130/80. Eating a healthy diet and exercising regularly are important for controlling blood pressure. If these steps are not enough to manage your blood pressure, you may need to take medicines. Take any prescribed medicines to control hypertension as told by your health care provider. Ask your health care provider if you should monitor your blood pressure at home. Have your blood pressure checked every year, even if your blood pressure is normal. Blood pressure increases with age and some medical conditions. Diabetes. Eating a healthy diet and exercising regularly are important parts of managing your blood sugar (glucose). If your blood sugar cannot be managed through diet and exercise, you may need to take medicines. Take any prescribed medicines to control your diabetes as told by your health care provider. Get evaluated for obstructive sleep apnea. Talk to  your health care provider about getting a sleep evaluation if you snore a lot or have excessive sleepiness. Make sure that any other medical conditions you have, such as atrial fibrillation or atherosclerosis, are managed. Nutrition Follow instructions from your health care provider about what to eat or drink to help manage your health condition. These instructions may include: Reducing your daily calorie intake. Limiting how much salt (sodium) you use to 1,500 milligrams (mg) each day. Using only healthy fats for cooking, such as olive oil, canola oil, or sunflower oil. Eating healthy foods. You can do this by: Choosing foods that are high in fiber, such as whole grains, and fresh fruits and vegetables. Eating at least 5 servings of fruits and vegetables a day. Try to fill one-half of your plate with fruits and vegetables at each meal. Choosing lean protein foods, such as lean cuts of meat, poultry without skin, fish, tofu, beans, and nuts. Eating low-fat dairy products. Avoiding foods that are high in sodium. This can help lower blood pressure. Avoiding foods that have saturated fat, trans fat, and cholesterol. This can help prevent high cholesterol. Avoiding processed and prepared foods. Counting your daily carbohydrate intake.  Lifestyle If you drink alcohol: Limit how much you have to: 0-1 drink a day for women who are not pregnant. 0-2 drinks a day for men. Know how much alcohol is in your drink. In the [place] one drink equals one 12 oz bottle of beer ( ), one 5 oz glass of wine ( ), or one 1 oz glass of hard liquor (44mL). Do not use any products that contain nicotine or tobacco. These products include cigarettes, chewing tobacco, and vaping devices, such as e-cigarettes. If you need help quitting, ask your health care provider. Avoid secondhand smoke. Do not use drugs. Activity  Try to stay at a healthy weight. Get at least 30 minutes of exercise on most days, such  as: Fast walking. Biking. Swimming. Medicines Take over-the-counter and prescription medicines only as told by your health care provider. Aspirin or blood thinners (antiplatelets or anticoagulants) may be recommended to reduce your risk of forming blood clots that can lead to stroke. Avoid taking birth control pills. Talk to your health care provider about the risks of taking birth control pills if: You are over 20 years old. You smoke. You get very bad headaches. You have had a blood clot. Where to find more information American Stroke Association: www.strokeassociation.org Get help right away if: You or a loved one has any symptoms of a stroke. "BE FAST" is an easy way to remember the main warning signs of a stroke: B - Balance. Signs are dizziness, sudden trouble walking, or loss of balance. E - Eyes. Signs are trouble seeing or a sudden change in vision. F - Face. Signs are sudden weakness or numbness of the face, or the face or eyelid drooping on one side. A - Arms. Signs are weakness or numbness in an arm. This happens suddenly and usually on one side of the body. S - Speech. Signs are sudden trouble speaking, slurred speech, or trouble understanding what people say. T - Time. Time to call emergency services. Write down what time symptoms started. You or a loved one has other signs of a stroke, such as: A sudden, severe headache with no known cause. Nausea or vomiting. Seizure. These symptoms may represent a serious problem that is an emergency. Do not wait to see if the symptoms will go away. Get medical help right away. Call  your local emergency services (911 in the [place] Do not drive yourself to the hospital. Summary You can help to prevent a stroke by eating healthy, exercising, not smoking, limiting alcohol intake, and managing any medical conditions you may have. Do not use any products that contain nicotine or tobacco. These include cigarettes, chewing tobacco, and vaping  devices, such as e-cigarettes. If you need help quitting, ask your health care provider. Remember "BE FAST" for warning signs of a stroke. Get help right away if you or a loved one has any of these signs. This information is not intended to replace advice given to you by your health care provider. Make sure you discuss any questions you have with your health care provider. Document Revised: 09/07/2022 Document Reviewed: 09/07/2022 Elsevier Patient Education  2024 [employer].

## 2023-09-08 ENCOUNTER — Other Ambulatory Visit: Payer: Self-pay | Admitting: Family Medicine

## 2023-09-08 DIAGNOSIS — K219 Gastro-esophageal reflux disease without esophagitis: Secondary | ICD-10-CM

## 2023-09-24 NOTE — Progress Notes (Signed)
Remote pacemaker transmission.   

## 2023-10-06 ENCOUNTER — Ambulatory Visit: Payer: Medicare HMO | Admitting: Neurology

## 2023-10-06 DIAGNOSIS — R4182 Altered mental status, unspecified: Secondary | ICD-10-CM | POA: Diagnosis not present

## 2023-10-06 DIAGNOSIS — R404 Transient alteration of awareness: Secondary | ICD-10-CM

## 2023-10-12 NOTE — Progress Notes (Signed)
Kindly inform the patient that EEG of brainwave study was normal.

## 2023-10-14 ENCOUNTER — Telehealth: Payer: Self-pay

## 2023-10-14 NOTE — Telephone Encounter (Signed)
-----   Message from Delia Heady sent at 10/12/2023  3:05 PM EST ----- Kindly inform the patient that EEG of brainwave study was normal

## 2023-10-14 NOTE — Telephone Encounter (Signed)
Contacted pt, LVM rq he call the office back.

## 2023-10-18 ENCOUNTER — Telehealth: Payer: Self-pay

## 2023-10-18 NOTE — Telephone Encounter (Signed)
Called patient to informed him that his EEG study was normal per Dr Pearlean Brownie. Pt verbalized understanding. Pt had no questions at this time but was encouraged to call back if questions arise.

## 2023-10-18 NOTE — Telephone Encounter (Signed)
-----   Message from Delia Heady sent at 10/12/2023  3:05 PM EST ----- Kindly inform the patient that EEG of brainwave study was normal

## 2023-11-03 ENCOUNTER — Encounter: Payer: Self-pay | Admitting: Cardiovascular Disease

## 2023-11-03 ENCOUNTER — Ambulatory Visit: Payer: Medicare HMO | Attending: Cardiovascular Disease | Admitting: Cardiovascular Disease

## 2023-11-03 VITALS — BP 96/58 | HR 79 | Ht 71.0 in | Wt 176.6 lb

## 2023-11-03 DIAGNOSIS — Z953 Presence of xenogenic heart valve: Secondary | ICD-10-CM

## 2023-11-03 DIAGNOSIS — I442 Atrioventricular block, complete: Secondary | ICD-10-CM

## 2023-11-03 DIAGNOSIS — I10 Essential (primary) hypertension: Secondary | ICD-10-CM

## 2023-11-03 DIAGNOSIS — E7800 Pure hypercholesterolemia, unspecified: Secondary | ICD-10-CM

## 2023-11-03 DIAGNOSIS — Z789 Other specified health status: Secondary | ICD-10-CM

## 2023-11-03 DIAGNOSIS — I2510 Atherosclerotic heart disease of native coronary artery without angina pectoris: Secondary | ICD-10-CM

## 2023-11-03 DIAGNOSIS — Z950 Presence of cardiac pacemaker: Secondary | ICD-10-CM

## 2023-11-03 NOTE — Patient Instructions (Signed)
 Medication Instructions:  No changes *If you need a refill on your cardiac medications before your next appointment, please call your pharmacy*  Testing/Procedures: Your physician has requested that you have an echocardiogram in 6 months. Echocardiography is a painless test that uses sound waves to create images of your heart. It provides your doctor with information about the size and shape of your heart and how well your heart's chambers and valves are working. This procedure takes approximately one hour. There are no restrictions for this procedure. Please do NOT wear cologne, perfume, aftershave, or lotions (deodorant is allowed). Please arrive 15 minutes prior to your appointment time.  Please note: We ask at that you not bring children with you during ultrasound (echo/ vascular) testing. Due to room size and safety concerns, children are not allowed in the ultrasound rooms during exams. Our front office staff cannot provide observation of children in our lobby area while testing is being conducted. An adult accompanying a patient to their appointment will only be allowed in the ultrasound room at the discretion of the ultrasound technician under special circumstances. We apologize for any inconvenience.    Follow-Up: At [facility], you and your health needs are our priority.  As part of our continuing mission to provide you with exceptional heart care, we have created designated Provider Care Teams.  These Care Teams include your primary Cardiologist (physician) and Advanced Practice Providers (APPs -  Physician Assistants and Nurse Practitioners) who all work together to provide you with the care you need, when you need it.  We recommend signing up for the patient portal called "MyChart".  Sign up information is provided on this After Visit Summary.  MyChart is used to connect with patients for Virtual Visits (Telemedicine).  Patients are able to view lab/test results, encounter  notes, upcoming appointments, etc.  Non-urgent messages can be sent to your provider as well.   To learn more about what you can do with MyChart, go to [email or web address].    Your next appointment:   1 year(s)  Provider:   Luana Rumple, MD

## 2023-11-03 NOTE — Progress Notes (Signed)
 Cardiology Office Note    Date:  11/03/2023   ID:  Joshua [name], DOB [birth year], MRN [id number]  Requesting PCP:  Joshua Mulder, DO  Cardiologist:   Joshua Rumple, MD   Chief complaint: AVR  History of Present Illness:  Joshua [name] is a 88 y.o. male with CAD and AS s/p SAVR (23 mm Inspiris) and CABG (LIMA-LAD, SVG-PDA, SVG-OM 11/04/2021, reop for bleeding 11/05/2021), postop CHB s/p PPM ([employer] Azure DR, Dr. Marven Slimmer 11/09/2021), HTN, hyperlipidemia returning for f/u.   He is in independent living at [facility]. He is accompanied by his son, Joshua [name].  He has no cardiac complaints. He uses a walker and has had a few falls, although much fewer and of lesser severity than in the past.  None of the falls were due to syncope.  His son believes they are largely caused by deconditioning and excessive bourbon use. He drinks 3 drinks a day, one at lunch and two in the evening. He usually skips breakfast and lunch and sometimes will also miss dinner.  Despite not leaving his house much except to go to the mailbox and dining hall, his pacemaker consistently logs > 2 hours/day of movement.  He is quite active, walking 2 hours a day, but is limited by his vision.  He denies chest pain or shortness of breath.  He has not had dizziness, palpitations or syncope.  He has mild ankle swelling.  Pacemaker function is normal.  Anticipated generator longevity is 11.5 years.  He has 14% atrial pacing and 57% ventricular pacing.  Lead parameters are excellent. He has not had Afib or VT and has had a handful of very brief nonsustained atrial tachycardia events.  He is originally from [place]  but has lived in [place], [place]  and other parts of the [place] . His son lives in the area which is why he relocated to [place] . He is a retired Airline pilot. He wants to live independently.  Past Medical History:  Diagnosis Date   AKI (acute kidney injury) (HCC) 08/10/2022   Arthritis     Cataract    Esophageal stricture    GERD (gastroesophageal reflux disease)    Hyperlipidemia    Hypertension    Liver disease    hepatitis   Normocytic anemia 06/04/2022   Pacemaker    Rhabdomyolysis 06/04/2022   Severe aortic stenosis     Past Surgical History:  Procedure Laterality Date   AORTIC VALVE REPLACEMENT N/A 11/04/2021   Procedure: AORTIC VALVE REPLACEMENT (AVR) USING 23 MM INSPIRIS RESILIA  AORTIC VALVE;  Surgeon: Hilarie Lovely, MD;  Location: [facility] OR;  Service: Open Heart Surgery;  Laterality: N/A;   cataract  2013   CORONARY ARTERY BYPASS GRAFT N/A 11/04/2021   Procedure: CORONARY ARTERY BYPASS GRAFTING (CABG) X 3, USING LEFT INTERNAL MAMMARY ARTERY AND ENDOSCOPICALLY HARVESTED RIGHT GREATER SAPHENOUS VEIN. LIMA TO LAD, SVG TO PDA, SVG TO OM;  Surgeon: Hilarie Lovely, MD;  Location: [facility] OR;  Service: Open Heart Surgery;  Laterality: N/A;   CORONARY PRESSURE/FFR STUDY N/A 11/03/2021   Procedure: INTRAVASCULAR PRESSURE WIRE/FFR STUDY;  Surgeon: Odie Benne, MD;  Location: [facility] INVASIVE CV LAB;  Service: Cardiovascular;  Laterality: N/A;   ENDOVEIN HARVEST OF GREATER SAPHENOUS VEIN Right 11/04/2021   Procedure: ENDOVEIN HARVEST OF GREATER SAPHENOUS VEIN;  Surgeon: Hilarie Lovely, MD;  Location: [facility] OR;  Service: Open Heart Surgery;  Laterality: Right;   EXPLORATION POST OPERATIVE OPEN HEART N/A 11/05/2021   Procedure:  EXPLORATION POST OPERATIVE OPEN HEARTFOR BLEEDING;  Surgeon: Zelphia Higashi, MD;  Location: [facility] OR;  Service: Open Heart Surgery;  Laterality: N/A;   HEMORROIDECTOMY  2001   HERNIA REPAIR  2005   PACEMAKER IMPLANT N/A 11/09/2021   Procedure: PACEMAKER IMPLANT;  Surgeon: Boyce Byes, MD;  Location: [facility] INVASIVE CV LAB;  Service: Cardiovascular;  Laterality: N/A;   RIGHT/LEFT HEART CATH AND CORONARY ANGIOGRAPHY N/A 11/03/2021   Procedure: RIGHT/LEFT HEART CATH AND CORONARY ANGIOGRAPHY;  Surgeon: Odie Benne, MD;   Location: [facility] INVASIVE CV LAB;  Service: Cardiovascular;  Laterality: N/A;   TEE WITHOUT CARDIOVERSION N/A 11/04/2021   Procedure: TRANSESOPHAGEAL ECHOCARDIOGRAM (TEE);  Surgeon: Hilarie Lovely, MD;  Location: [facility] OR;  Service: Open Heart Surgery;  Laterality: N/A;   TEMPORARY PACEMAKER N/A 11/07/2021   Procedure: TEMPORARY PACEMAKER;  Surgeon: Kyra Phy, MD;  Location: [facility] INVASIVE CV LAB;  Service: Cardiovascular;  Laterality: N/A;   TONSILLECTOMY  1948    Current Medications: Outpatient Medications Prior to Visit  Medication Sig Dispense Refill   aspirin  EC 81 MG tablet Take 1 tablet (81 mg total) by mouth daily. Swallow whole. 90 tablet 3   atorvastatin  (LIPITOR) 80 MG tablet Take 1 tablet (80 mg total) by mouth daily. 90 tablet 3   Glucosamine HCl (GLUCOSAMINE PO) Take 3,000 mg by mouth every other day.     melatonin 5 MG TABS Take 1 tablet (5 mg total) by mouth at bedtime as needed. 30 tablet 0   metoprolol  tartrate (LOPRESSOR ) 25 MG tablet TAKE 1/2 TABLET TWICE DAILY, HOLD FOR HEART RATE LESS THAN 60, SYSTOLIC BLOOD PRESSURE LESS THAN OR EQUAL TO 105 90 tablet 3   Multiple Vitamin (MULTIVITAMIN) capsule Take 1 capsule by mouth daily.     omeprazole  (PRILOSEC) 40 MG capsule TAKE 1 CAPSULE EVERY DAY 90 capsule 3   vitamin B-12 (CYANOCOBALAMIN ) 1000 MCG tablet Take 1,000 mcg by mouth daily.     No facility-administered medications prior to visit.     Allergies:   Patient has no known allergies.   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  Tobacco Use   Smoking status: Former    Current packs/day: 0.00    Types: Cigarettes    Quit date: 1965    Years since quitting: 60.0   Smokeless tobacco: Never  Vaping  Use   Vaping status: Never Used  Substance and Sexual Activity   Alcohol  use: Yes    Alcohol /week: 21.0 standard drinks of alcohol     Types: 21 Shots of liquor per week    Comment: daily a few cocktails   Drug use: No   Sexual activity: Never  Other Topics Concern   Not on file  Social History Narrative   Epworth Sleepiness Scale = 3 (as of 07/15/2016)   Social Drivers of Health   Financial Resource Strain: Low Risk  (08/26/2023)   Overall Financial Resource Strain (CARDIA)    Difficulty of Paying Living Expenses: Not hard at all  Food Insecurity: No Food Insecurity (08/26/2023)   Hunger Vital Sign    Worried About Running Out of Food in the Last Year: Never true    Ran  Out of Food in the Last Year: Never true  Transportation Needs: No Transportation Needs (08/26/2023)   PRAPARE - Administrator, Civil Service (Medical): No    Lack of Transportation (Non-Medical): No  Physical Activity: Insufficiently Active (08/26/2023)   Exercise Vital Sign    Days of Exercise per Week: 2 days    Minutes of Exercise per Session: 60 min  Stress: No Stress Concern Present (08/26/2023)   [employer] of Occupational Health - Occupational Stress Questionnaire    Feeling of Stress : Not at all  Social Connections: Socially Isolated (08/26/2023)   Social Connection and Isolation Panel [NHANES]    Frequency of Communication with Friends and Family: More than three times a week    Frequency of Social Gatherings with Friends and Family: Twice a week    Attends Religious Services: Never    Database administrator or Organizations: No    Attends Banker Meetings: Never    Marital Status: Widowed     Family History:  His father had rheumatic fever, valve replacement surgery and subsequent congestive heart failure. His mother also died of congestive heart failure when she was 86 years old. He has lost a couple of siblings at a young age in a car accident.  ROS:   Please see  the history of present illness.    ROS All other systems reviewed and are negative.   PHYSICAL EXAM:   VS:  BP (!) 96/58 (BP Location: Left Arm, Patient Position: Sitting, Cuff Size: Normal)   Pulse 79   Ht 5\' 11"  (1.803 m)   Wt 176 lb 9.6 oz (80.1 kg)   BMI 24.63 kg/m    General: Alert, oriented x3, no distress, appears well.  Healthy left subclavian pacemaker site Head: no evidence of trauma, PERRL, EOMI, no exophtalmos or lid lag, no myxedema, no xanthelasma; normal ears, nose and oropharynx Neck: normal jugular venous pulsations and no hepatojugular reflux; brisk carotid pulses without delay and bilateral carotid bruits Chest: clear to auscultation, no signs of consolidation by percussion or palpation, normal fremitus, symmetrical and full respiratory excursions Cardiovascular: normal position and quality of the apical impulse, regular rhythm, normal first and second heart sounds, barely audible early peaking systolic ejection murmur in the aortic focus, no diastolic murmurs, rubs or gallops Abdomen: no tenderness or distention, no masses by palpation, no abnormal pulsatility or arterial bruits, normal bowel sounds, no hepatosplenomegaly Extremities: no clubbing, cyanosis or edema; 2+ radial, ulnar and brachial pulses bilaterally; 2+ right femoral, posterior tibial and dorsalis pedis pulses; 2+ left femoral, posterior tibial and dorsalis pedis pulses; no subclavian or femoral bruits Neurological: grossly nonfocal Psych: Normal mood and affect   Wt Readings from Last 3 Encounters:  11/03/23 176 lb 9.6 oz (80.1 kg)  08/31/23 176 lb (79.8 kg)  05/24/23 181 lb 8 oz (82.3 kg)      Studies/Labs Reviewed:  Echocardiogram 08/10/2022    1. Left ventricular ejection fraction, by estimation, is 60 to 65%. The  left ventricle has normal function. The left ventricle has no regional  wall motion abnormalities. There is mild concentric left ventricular  hypertrophy. Left ventricular  diastolic  parameters are consistent with Grade I diastolic dysfunction (impaired  relaxation).   2. Right ventricular systolic function was not well visualized. The right  ventricular size is normal. Tricuspid regurgitation signal is inadequate  for assessing PA pressure.   3. Left atrial size was mild to moderately dilated.   4.  The mitral valve is degenerative. No evidence of mitral valve  regurgitation. No evidence of mitral stenosis.   5. The aortic valve was not well visualized. There is moderate  calcification of the aortic valve. Aortic valve regurgitation is not  visualized. Moderate aortic valve stenosis. Aortic valve area, by VTI  measures 1.38 cm. Aortic valve mean gradient  measures 23.0 mmHg.   6. Technically difficult study with poor acoustic windows.   AV Area (VTI):     1.38 cm  AV Vmax:           326.00 cm/s  AV Peak Grad:      42.5 mmHg  AV Mean Grad:      23.0 mmHg  LVOT/AV VTI ratio: 0.36    Cardiac catheterization 11/03/2021  Mid RCA lesion is 99% stenosed.   Prox RCA lesion is 60% stenosed.   Mid LM lesion is 40% stenosed.   Ost LAD to Prox LAD lesion is 70% stenosed.   Prox LAD to Mid LAD lesion is 40% stenosed.   Prox Cx to Mid Cx lesion is 40% stenosed.   Mild to moderate distal left main stenosis.  Severe, heavily calcified ostial LAD stenosis (RFR 0.78 suggesting the lesion is flow limiting).  Mild mid Circumflex stenosis Large dominant RCA with eccentric, nodular calcific lesion in the proximal vessel. This is a moderate stenosis. The mid RCA has a severe heavily calcified stenosis.  Normal right heart pressures Severe aortic stenosis by echo (aortic valve not crossed today)   Recommendations: He has severe, heavily calcified CAD involving the ostial LAD and the mid RCA. PCI of the LAD lesion would be high risk. I have reviewed his films with our interventional and structural team and I think the best option is to consider CABG and AVR. Given recent  syncope, will admit him to the telemetry unit and have CT surgery see him to review options for CABG with surgical AVR. He appears to be a good candidate for surgery.    Diagnostic Dominance: Right  EKG:    EKG Interpretation Date/Time:  Wednesday November 03 2023 10:34:39 EST Ventricular Rate:  79 PR Interval:  172 QRS Duration:  126 QT Interval:  410 QTC Calculation: 470 R Axis:   -77  Text Interpretation: Normal sinus rhythm Left axis deviation Right bundle branch block Inferior infarct , age undetermined When compared with ECG of 09-Aug-2022 23:37, Rate slower Confirmed by Teniqua Marron [id number]) on 11/03/2023 10:44:08 AM         Recent Labs: 11/18/2022: ALT 10; BUN 13; Creatinine, Ser 1.26; Potassium 4.7; Sodium 138     ASSESSMENT:    1. Coronary artery disease involving native coronary artery of native heart without angina pectoris   2. Essential hypertension   3. CHB (complete heart block) (HCC)   4. S/P aortic valve replacement with bioprosthetic valve   5. S/P placement of cardiac pacemaker   6. Hypercholesterolemia   7. Alcohol  use      PLAN:  In order of problems listed above:  CAD s/p CABG: Asymptomatic. On statin and ASA and low dose beta blocker. S/P AVR: reminded him of the need for endocarditis prophylaxis with dental procedures or other interventions that can cause severe bacteremia.  Standing prescription for amoxicillin .  Recheck his echo later this year, as the mean gradient is a little higher than before. CHB: Had 100% pacing for the first couple of months after surgery, but now ventricular pacing only occurs about 50% of the time,  suggesting recovery of AV node function with reduction in perioperative edema/injury. PPM: Normal device function.  System is MRI conditional.  Followed by Dr. Marven Slimmer. HLP: On statin.  Target LDL less than 70. LDL was slightly higher a year ago, but due for a recheck w PCP. On max dose atorvastatin . Consider adding  ezetimibe. HTN: BP was really only elevated when he was taking NSAIDs. BP is borderline low today, asymptomatic. Encouraged him to stay well hydrated. Alcohol  use: he is drinking too much and this can have adverse health results. Try to limit to one or two regular size "shots" a day (he is probably drinking at least twice that amount).    Medication Adjustments/Labs and Tests Ordered: Current medicines are reviewed at length with the patient today.  Concerns regarding medicines are outlined above.  Medication changes, Labs and Tests ordered today are listed in the Patient Instructions below. Patient Instructions  Medication Instructions:  No changes *If you need a refill on your cardiac medications before your next appointment, please call your pharmacy*  Testing/Procedures: Your physician has requested that you have an echocardiogram in 6 months. Echocardiography is a painless test that uses sound waves to create images of your heart. It provides your doctor with information about the size and shape of your heart and how well your heart's chambers and valves are working. This procedure takes approximately one hour. There are no restrictions for this procedure. Please do NOT wear cologne, perfume, aftershave, or lotions (deodorant is allowed). Please arrive 15 minutes prior to your appointment time.  Please note: We ask at that you not bring children with you during ultrasound (echo/ vascular) testing. Due to room size and safety concerns, children are not allowed in the ultrasound rooms during exams. Our front office staff cannot provide observation of children in our lobby area while testing is being conducted. An adult accompanying a patient to their appointment will only be allowed in the ultrasound room at the discretion of the ultrasound technician under special circumstances. We apologize for any inconvenience.    Follow-Up: At [facility], you and your health needs are our  priority.  As part of our continuing mission to provide you with exceptional heart care, we have created designated Provider Care Teams.  These Care Teams include your primary Cardiologist (physician) and Advanced Practice Providers (APPs -  Physician Assistants and Nurse Practitioners) who all work together to provide you with the care you need, when you need it.  We recommend signing up for the patient portal called "MyChart".  Sign up information is provided on this After Visit Summary.  MyChart is used to connect with patients for Virtual Visits (Telemedicine).  Patients are able to view lab/test results, encounter notes, upcoming appointments, etc.  Non-urgent messages can be sent to your provider as well.   To learn more about what you can do with MyChart, go to [email or web address].    Your next appointment:   1 year(s)  Provider:   Luana Rumple, MD             Signed, Joshua Rumple, MD  11/03/2023 1:09 PM    [facility] [address] [place], [place], [place]  [address] Phone: [phone]; Fax: [phone]

## 2023-11-13 DIAGNOSIS — R55 Syncope and collapse: Secondary | ICD-10-CM | POA: Diagnosis not present

## 2023-11-15 ENCOUNTER — Telehealth: Payer: Self-pay

## 2023-11-15 NOTE — Telephone Encounter (Signed)
Can we please cancel his scheduled echo. I called his son and gave our condolences.

## 2023-11-15 NOTE — Telephone Encounter (Signed)
Alert received from [employer] for VT/VF in progress on presenting rhythm 1/24 @ 22:07.  Spoke to patients son Joshua [name] who advised patient passed away Dec 17, 2023 morning at home. Offered condolences and prayers. Advised if there is anything we can assist with to please let us know.

## 2023-11-15 NOTE — Telephone Encounter (Signed)
Sorry to hear that. Thanks for letting me know.

## 2023-11-20 ENCOUNTER — Telehealth: Payer: Self-pay | Admitting: Family Medicine

## 2023-11-20 DIAGNOSIS — 419620001 Death: Secondary | SNOMED CT | POA: Diagnosis not present

## 2023-11-20 NOTE — Telephone Encounter (Signed)
I completed Joshua [name]'s death certificate to the best of my ability with the available knowledge provided by family.

## 2023-11-20 DEATH — deceased

## 2023-11-24 ENCOUNTER — Encounter: Payer: Medicare HMO | Admitting: Family Medicine

## 2023-11-29 ENCOUNTER — Ambulatory Visit: Payer: Medicare HMO

## 2023-12-06 ENCOUNTER — Encounter: Payer: Medicare HMO | Admitting: Family Medicine

## 2024-02-28 ENCOUNTER — Ambulatory Visit: Payer: Medicare HMO

## 2024-04-10 ENCOUNTER — Encounter: Payer: Self-pay | Admitting: Neurology

## 2024-04-10 ENCOUNTER — Ambulatory Visit: Payer: Medicare HMO | Admitting: Neurology

## 2024-05-05 ENCOUNTER — Other Ambulatory Visit (HOSPITAL_COMMUNITY): Payer: Medicare HMO

## 2024-05-29 ENCOUNTER — Ambulatory Visit: Payer: Medicare HMO

## 2024-08-28 ENCOUNTER — Ambulatory Visit: Payer: Medicare HMO

## 2024-11-27 ENCOUNTER — Ambulatory Visit: Payer: Medicare HMO
